# Patient Record
Sex: Female | Born: 1955 | Race: Black or African American | Hispanic: No | Smoking: Former smoker
Health system: Southern US, Community
[De-identification: ages and names within clinical notes are randomized; demographics above are authoritative.]

## PROBLEM LIST (undated history)

## (undated) DIAGNOSIS — M810 Age-related osteoporosis without current pathological fracture: Secondary | ICD-10-CM

## (undated) DIAGNOSIS — G35 Multiple sclerosis: Secondary | ICD-10-CM

## (undated) DIAGNOSIS — C801 Malignant (primary) neoplasm, unspecified: Secondary | ICD-10-CM

## (undated) DIAGNOSIS — R413 Other amnesia: Secondary | ICD-10-CM

## (undated) DIAGNOSIS — R269 Unspecified abnormalities of gait and mobility: Secondary | ICD-10-CM

## (undated) DIAGNOSIS — G709 Myoneural disorder, unspecified: Secondary | ICD-10-CM

## (undated) HISTORY — PX: MASTECTOMY: SHX3

## (undated) HISTORY — DX: Multiple sclerosis: G35

## (undated) HISTORY — PX: TONSILLECTOMY: SUR1361

## (undated) HISTORY — DX: Other amnesia: R41.3

## (undated) HISTORY — PX: DIAGNOSTIC LAPAROSCOPY: SUR761

## (undated) HISTORY — DX: Age-related osteoporosis without current pathological fracture: M81.0

## (undated) HISTORY — DX: Unspecified abnormalities of gait and mobility: R26.9

---

## 1999-04-29 ENCOUNTER — Emergency Department (HOSPITAL_COMMUNITY): Admission: EM | Admit: 1999-04-29 | Discharge: 1999-04-29 | Payer: Self-pay | Admitting: Emergency Medicine

## 1999-07-15 ENCOUNTER — Emergency Department (HOSPITAL_COMMUNITY): Admission: EM | Admit: 1999-07-15 | Discharge: 1999-07-15 | Payer: Self-pay | Admitting: Emergency Medicine

## 1999-07-15 ENCOUNTER — Encounter: Payer: Self-pay | Admitting: Emergency Medicine

## 2000-08-12 ENCOUNTER — Encounter: Payer: Self-pay | Admitting: Emergency Medicine

## 2000-08-12 ENCOUNTER — Emergency Department (HOSPITAL_COMMUNITY): Admission: EM | Admit: 2000-08-12 | Discharge: 2000-08-12 | Payer: Self-pay | Admitting: Emergency Medicine

## 2003-11-04 ENCOUNTER — Other Ambulatory Visit: Payer: Self-pay

## 2004-11-23 ENCOUNTER — Emergency Department: Payer: Self-pay | Admitting: Emergency Medicine

## 2007-05-29 ENCOUNTER — Emergency Department (HOSPITAL_COMMUNITY): Admission: EM | Admit: 2007-05-29 | Discharge: 2007-05-29 | Payer: Self-pay | Admitting: Emergency Medicine

## 2007-08-06 ENCOUNTER — Emergency Department (HOSPITAL_COMMUNITY): Admission: EM | Admit: 2007-08-06 | Discharge: 2007-08-06 | Payer: Self-pay | Admitting: Family Medicine

## 2007-11-11 ENCOUNTER — Emergency Department (HOSPITAL_COMMUNITY): Admission: EM | Admit: 2007-11-11 | Discharge: 2007-11-11 | Payer: Self-pay | Admitting: Emergency Medicine

## 2008-01-13 ENCOUNTER — Encounter: Admission: RE | Admit: 2008-01-13 | Discharge: 2008-01-13 | Payer: Self-pay | Admitting: Gastroenterology

## 2009-12-24 ENCOUNTER — Emergency Department (HOSPITAL_COMMUNITY): Admission: EM | Admit: 2009-12-24 | Discharge: 2009-12-25 | Payer: Self-pay | Admitting: Emergency Medicine

## 2010-02-07 ENCOUNTER — Emergency Department (HOSPITAL_COMMUNITY): Admission: EM | Admit: 2010-02-07 | Discharge: 2010-02-07 | Payer: Self-pay | Admitting: Emergency Medicine

## 2010-03-26 ENCOUNTER — Emergency Department (HOSPITAL_COMMUNITY): Admission: EM | Admit: 2010-03-26 | Discharge: 2010-03-26 | Payer: Self-pay | Admitting: Psychiatry

## 2010-08-07 ENCOUNTER — Encounter: Payer: Self-pay | Admitting: Internal Medicine

## 2010-12-06 ENCOUNTER — Emergency Department (HOSPITAL_COMMUNITY): Payer: Medicare Other

## 2010-12-06 ENCOUNTER — Emergency Department (HOSPITAL_COMMUNITY)
Admission: EM | Admit: 2010-12-06 | Discharge: 2010-12-06 | Disposition: A | Discharge status: Home / Self Care | Payer: Medicare Other | Attending: Emergency Medicine | Admitting: Emergency Medicine

## 2010-12-06 DIAGNOSIS — M25579 Pain in unspecified ankle and joints of unspecified foot: Secondary | ICD-10-CM | POA: Insufficient documentation

## 2010-12-06 DIAGNOSIS — M25559 Pain in unspecified hip: Secondary | ICD-10-CM | POA: Insufficient documentation

## 2010-12-06 DIAGNOSIS — M7989 Other specified soft tissue disorders: Secondary | ICD-10-CM | POA: Insufficient documentation

## 2010-12-06 DIAGNOSIS — M25569 Pain in unspecified knee: Secondary | ICD-10-CM | POA: Insufficient documentation

## 2010-12-06 DIAGNOSIS — M79609 Pain in unspecified limb: Secondary | ICD-10-CM | POA: Insufficient documentation

## 2010-12-06 DIAGNOSIS — Y92009 Unspecified place in unspecified non-institutional (private) residence as the place of occurrence of the external cause: Secondary | ICD-10-CM | POA: Insufficient documentation

## 2010-12-06 DIAGNOSIS — W108XXA Fall (on) (from) other stairs and steps, initial encounter: Secondary | ICD-10-CM | POA: Insufficient documentation

## 2010-12-06 DIAGNOSIS — G35 Multiple sclerosis: Secondary | ICD-10-CM | POA: Insufficient documentation

## 2010-12-06 DIAGNOSIS — S8263XA Displaced fracture of lateral malleolus of unspecified fibula, initial encounter for closed fracture: Secondary | ICD-10-CM | POA: Insufficient documentation

## 2011-04-06 LAB — POCT URINALYSIS DIP (DEVICE)
Glucose, UA: NEGATIVE
Ketones, ur: NEGATIVE
Specific Gravity, Urine: 1.025
Urobilinogen, UA: 2 — ABNORMAL HIGH

## 2011-04-11 LAB — WET PREP, GENITAL
Clue Cells Wet Prep HPF POC: NONE SEEN
Yeast Wet Prep HPF POC: NONE SEEN

## 2011-04-11 LAB — POCT I-STAT, CHEM 8
BUN: 12
Chloride: 104
Creatinine, Ser: 1
Glucose, Bld: 90
Hemoglobin: 12.9
Potassium: 3.6

## 2011-04-11 LAB — RPR: RPR Ser Ql: NONREACTIVE

## 2011-04-25 LAB — COMPREHENSIVE METABOLIC PANEL
ALT: 13
AST: 17
BUN: 13
Calcium: 9.5
GFR calc non Af Amer: >60
Glucose, Bld: 89

## 2011-04-25 LAB — DIFFERENTIAL
Eosinophils Absolute: 0 — ABNORMAL LOW
Eosinophils Relative: 0
Lymphs Abs: 1.9
Monocytes Relative: 11

## 2011-04-25 LAB — URINALYSIS, ROUTINE W REFLEX MICROSCOPIC
Bilirubin Urine: NEGATIVE
Ketones, ur: NEGATIVE
Nitrite: NEGATIVE
Protein, ur: NEGATIVE
Urobilinogen, UA: 1

## 2011-04-25 LAB — CBC
HCT: 37.9
Hemoglobin: 12.8
MCV: 91.1
Platelets: 236
RDW: 13.7

## 2011-04-25 LAB — LIPASE, BLOOD: Lipase: 30

## 2012-05-13 ENCOUNTER — Other Ambulatory Visit: Payer: Self-pay | Admitting: Neurology

## 2012-05-13 DIAGNOSIS — R413 Other amnesia: Secondary | ICD-10-CM

## 2012-05-13 DIAGNOSIS — R269 Unspecified abnormalities of gait and mobility: Secondary | ICD-10-CM

## 2012-05-13 DIAGNOSIS — W19XXXA Unspecified fall, initial encounter: Secondary | ICD-10-CM

## 2012-05-20 ENCOUNTER — Ambulatory Visit
Admission: RE | Admit: 2012-05-20 | Discharge: 2012-05-20 | Disposition: A | Discharge status: Home / Self Care | Payer: Medicare Other | Source: Ambulatory Visit | Attending: Neurology | Admitting: Neurology

## 2012-05-20 DIAGNOSIS — R269 Unspecified abnormalities of gait and mobility: Secondary | ICD-10-CM

## 2012-05-20 DIAGNOSIS — W19XXXA Unspecified fall, initial encounter: Secondary | ICD-10-CM

## 2012-05-20 DIAGNOSIS — R413 Other amnesia: Secondary | ICD-10-CM

## 2012-05-31 ENCOUNTER — Other Ambulatory Visit: Payer: Self-pay | Admitting: Neurology

## 2012-05-31 DIAGNOSIS — R269 Unspecified abnormalities of gait and mobility: Secondary | ICD-10-CM

## 2012-05-31 DIAGNOSIS — R413 Other amnesia: Secondary | ICD-10-CM

## 2012-05-31 DIAGNOSIS — W19XXXA Unspecified fall, initial encounter: Secondary | ICD-10-CM

## 2012-06-06 ENCOUNTER — Other Ambulatory Visit: Payer: Medicare Other

## 2012-06-06 ENCOUNTER — Ambulatory Visit
Admission: RE | Admit: 2012-06-06 | Discharge: 2012-06-06 | Disposition: A | Discharge status: Home / Self Care | Payer: Medicare Other | Source: Ambulatory Visit | Attending: Neurology | Admitting: Neurology

## 2012-06-06 VITALS — BP 113/67 | HR 55

## 2012-06-06 DIAGNOSIS — W19XXXA Unspecified fall, initial encounter: Secondary | ICD-10-CM

## 2012-06-06 DIAGNOSIS — R269 Unspecified abnormalities of gait and mobility: Secondary | ICD-10-CM

## 2012-06-06 DIAGNOSIS — R413 Other amnesia: Secondary | ICD-10-CM

## 2012-06-06 LAB — CSF CELL COUNT WITH DIFFERENTIAL: Tube #: 4

## 2012-06-06 LAB — GRAM STAIN: Gram Stain: NONE SEEN

## 2012-06-06 NOTE — Progress Notes (Signed)
Blood drawn for labs to go with spinal fluid, #21 g used, right AC, site unremarkable, discharge instructions explained to pt. JKL RN

## 2012-06-10 LAB — VDRL, CSF

## 2012-06-11 LAB — MULTIPLE SCLEROSIS PANEL 2
CNS-IgG Synthesis Rate: 27.9 mg/24hr — ABNORMAL HIGH (ref <3.3)
IgA CSF: 0.49 mg/dL (ref 0.15–0.60)
IgA Total: 225 mg/dL (ref 81–463)
IgG Total: 1712 mg/dL — ABNORMAL HIGH (ref 694–1618)
IgG-Index: 1.07 — ABNORMAL HIGH (ref <0.70)
IgM Total: 124 mg/dL (ref 48–271)
IgM-CSF: 0.19 mg/dL — ABNORMAL HIGH (ref <0.10)

## 2012-12-03 ENCOUNTER — Encounter: Payer: Self-pay | Admitting: Nurse Practitioner

## 2012-12-03 DIAGNOSIS — W19XXXA Unspecified fall, initial encounter: Secondary | ICD-10-CM | POA: Insufficient documentation

## 2012-12-03 DIAGNOSIS — R413 Other amnesia: Secondary | ICD-10-CM | POA: Insufficient documentation

## 2012-12-03 DIAGNOSIS — R269 Unspecified abnormalities of gait and mobility: Secondary | ICD-10-CM

## 2012-12-10 ENCOUNTER — Encounter: Payer: Self-pay | Admitting: Nurse Practitioner

## 2012-12-10 ENCOUNTER — Ambulatory Visit (INDEPENDENT_AMBULATORY_CARE_PROVIDER_SITE_OTHER): Payer: Medicaid Other | Admitting: Nurse Practitioner

## 2012-12-10 VITALS — BP 114/65 | HR 63 | Ht 70.5 in | Wt 134.0 lb

## 2012-12-10 DIAGNOSIS — G35 Multiple sclerosis: Secondary | ICD-10-CM

## 2012-12-10 DIAGNOSIS — R269 Unspecified abnormalities of gait and mobility: Secondary | ICD-10-CM

## 2012-12-10 DIAGNOSIS — R413 Other amnesia: Secondary | ICD-10-CM

## 2012-12-10 NOTE — Patient Instructions (Addendum)
Continue HEP per Physical therapy F/U in 1 year or sooner if needed

## 2012-12-10 NOTE — Progress Notes (Signed)
HPI: Patient returns for followup after last visit with Dr. Terrace Arabia 011/26/2013. She has a history of gait disorder which has been going on for several years,a POA    That oversees her care. Patient only provides limited history.  Most recent MRI of the brain shows multiple brainstem, cerebellar, subcortical and corpus callosum white matter hyperintensities suspicious for demyelinating disease. Presence of T1 black holes and atrophy indicate chronic disease. Patient has never been on any immune modulating therapy. Her mom and her sister both died of MS at early ages. She has received some physical therapy since last seen which was beneficial. She goes to the [facility] five times a week for 8 hours. She has had an abnormal visual evoked response. She had greater than 5 oligoclonal bands in her CSF with LP done 06/11/2012. She denies any falls since last seen. She is not using an assistive device. Her memory is stable  ROS:  Negative  Physical Exam General: well developed, thin female seated, in no evident distress Head: head normocephalic and atraumatic. Oropharynx benign Neck: supple with no carotid  bruits Cardiovascular: regular rate and rhythm, no murmurs  Neurologic Exam Mental Status: Awake and fully alert. MMSE 23/30 missing items in orientation attention and calculation and one of 3 recall .Follows all commands .  Cranial Nerves:  Pupils equal, briskly reactive to light. Extraocular movements full without nystagmus. Visual fields full to confrontation. Hearing intact and symmetric to finger snap. Facial sensation intact. Face, tongue, palate move normally and symmetrically. Neck flexion and extension normal.  Motor: Mild bilateral lower extremity spasticity, no significant weakness, mild upper and lower rigidity in the extremities Sensory.: intact to touch and pinprick and vibratory.  Coordination: Rapid alternating movements normal in all extremities. Finger-to-nose and heel-to-shin  performed accurately bilaterally. Gait and Station: Wide-based gait cautious and unsteady. Unable to tandem. Romberg positive. No assistive device   Reflexes: 2+ and symmetric except patellar 3+. Toes downgoing.     ASSESSMENT: Chronic gait difficulty, wide-based unsteady gait, hyperreflexia, bilateral lower extremity spasticity, memory loss, MRI of the brain with multiple brainstem cerebellar subcortical and corpus callosum white matter hyperintensities with presence of T1 black holes in atrophy of the corpus callosum and cortex which indicates chronic disease. LP in November 2013 positive for oligoclonal bands. 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

## 2013-03-21 ENCOUNTER — Telehealth: Payer: Self-pay | Admitting: Neurology

## 2013-03-21 NOTE — Telephone Encounter (Signed)
I have received a call her primary care physician Dr. Milus Glazier. Patient lives with her daughter, has progressive worsening memory, incontinence, last seen by Eber Jones in May 2014,   . She has a history of gait disorder which has been going on for several years,a POA That oversees her care. Patient only provides limited history. Most recent MRI of the brain shows multiple brainstem, cerebellar, subcortical and corpus callosum white matter hyperintensities suspicious for demyelinating disease. Presence of T1 black holes and atrophy indicate chronic disease. Patient has never been on any immune modulating therapy. Her mom and her sister both died of MS at early ages. She has received some physical therapy since last seen which was beneficial. She goes to the [facility] five times a week for 8 hours. She has had an abnormal visual evoked response. She had greater than 5 oligoclonal bands in her CSF with LP done 06/11/2012. She denies any falls since last seen. She is not using an assistive device.    Fax [phone]  She has likely secondary progressive MS, need more home health.  Annabelle Harman, please fax previous note over to above fax No.

## 2013-03-24 NOTE — Telephone Encounter (Signed)
Done. Faxed.

## 2013-08-05 ENCOUNTER — Telehealth: Payer: Self-pay | Admitting: *Deleted

## 2013-08-05 NOTE — Telephone Encounter (Signed)
Patient has hx of MS and gait disorder and memory problems. See if there is a sooner appt with me in the next few weeks

## 2013-08-05 NOTE — Telephone Encounter (Signed)
Melissa Oconnell called in regards to the patient and wanted a sooner appointment due to the patients memory and her leg weakness. [name] was not able to find a sooner appointment and gave message to me. Not sure if the patient needs to come in sooner and if so could she see Dr. Krista Blue if she has anything. Please advise.

## 2013-08-06 NOTE — Telephone Encounter (Signed)
Tried calling Rosanna at [phone] but no answer. Was calling to set up a sooner appointment for patient. Could not leave a message due to voicemail not being set up.

## 2013-08-18 ENCOUNTER — Ambulatory Visit (INDEPENDENT_AMBULATORY_CARE_PROVIDER_SITE_OTHER): Payer: Medicare Other | Admitting: Nurse Practitioner

## 2013-08-18 ENCOUNTER — Encounter: Payer: Self-pay | Admitting: Nurse Practitioner

## 2013-08-18 ENCOUNTER — Encounter (INDEPENDENT_AMBULATORY_CARE_PROVIDER_SITE_OTHER): Payer: Self-pay

## 2013-08-18 VITALS — BP 139/83 | HR 78 | Ht 70.5 in

## 2013-08-18 DIAGNOSIS — R413 Other amnesia: Secondary | ICD-10-CM

## 2013-08-18 DIAGNOSIS — R269 Unspecified abnormalities of gait and mobility: Secondary | ICD-10-CM

## 2013-08-18 DIAGNOSIS — G35 Multiple sclerosis: Secondary | ICD-10-CM

## 2013-08-18 NOTE — Progress Notes (Signed)
[facility]  PATIENT: POLLYANN ROA DOB: 1955/08/27   REASON FOR VISIT: Followup for secondary progressive multiple sclerosis    HISTORY OF PRESENT ILLNESS:Ms Alejandro, 58 year old white female returns for followup. She has a history of gait disorder and secondary progressive multiple sclerosis. She has never been on immune modulating therapy for her multiple sclerosis. She continues to get socialization at the [facility]. She has a power of attorney that oversees her care. She has 2 sons but they are not involved and she has not talked to them in years. She has a walker that she uses at home, she did not bring an assistive device today. Her appetite is good and she is sleeping well. She returns for reevaluation  She has a history of gait disorder which has been going on for several years,a POA That oversees her care. Patient only provides limited history. Most recent MRI of the brain shows multiple brainstem, cerebellar, subcortical and corpus callosum white matter hyperintensities suspicious for demyelinating disease. Presence of T1 black holes and atrophy indicate chronic disease. Patient has never been on any immune modulating therapy. Her mom and her sister both died of MS at early ages. She has received some physical therapy since last seen which was beneficial. She goes to the [facility] five times a week for 8 hours. She has had an abnormal visual evoked response. She had greater than 5 oligoclonal bands in her CSF with LP done 06/11/2012. She denies any falls since last seen. She is not using an assistive device. Her memory is stable  REVIEW OF SYSTEMS: Full 14 system review of systems performed and notable only for those listed, all others are neg:  Constitutional: N/A  Cardiovascular: N/A  Ear/Nose/Throat: N/A  Skin: N/A  Eyes: N/A  Respiratory: N/A  Gastroitestinal: N/A  Hematology/Lymphatic: N/A  Endocrine: N/A Musculoskeletal:N/A    Allergy/Immunology: N/A  Neurological: N/A Psychiatric: N/A   ALLERGIES: No Known Allergies  HOME MEDICATIONS: No outpatient prescriptions prior to visit.   No facility-administered medications prior to visit.    PAST MEDICAL HISTORY: Past Medical History  Diagnosis Date  . Multiple sclerosis   . Memory loss   . Abnormality of gait     PAST SURGICAL HISTORY: History reviewed. No pertinent past surgical history.  FAMILY HISTORY: History reviewed. No pertinent family history.  SOCIAL HISTORY: History   Social History  . Marital Status: Legally Separated    Spouse Name: N/A    Number of Children: 2  . Years of Education: N/A   Occupational History  . Not on file.   Social History Main Topics  . Smoking status: Former Research scientist (life sciences)  . Smokeless tobacco: Never Used  . Alcohol Use: No  . Drug Use: No  . Sexual Activity: Not on file   Other Topics Concern  . Not on file   Social History Narrative   Patient lives at home with [name]. Patient has 2 children. Patient does not work.      PHYSICAL EXAM  Filed Vitals:   08/18/13 0904  BP: 139/83  Pulse: 78  Height: 5' 10.5" (1.791 m)   Body mass index is 0.00 kg/(m^2).  Generalized: Well developed, in no acute distress  Head: normocephalic and atraumatic,. Oropharynx benign  Neck: Supple, no carotid bruits  Cardiac: Regular rate rhythm, no murmur  Musculoskeletal: No deformity   Neurological examination   Mentation: Alert , MMSE 20/30 missing items in orientation, calculation, and 2/3 recall. AFT  9.  Follows all commands speech and language fluent  Cranial nerve II-XII: Pupils were equal round reactive to light extraocular movements were full, visual field were full on confrontational test. Facial sensation and strength were normal. hearing was intact to finger rubbing bilaterally. Uvula tongue midline. head turning and shoulder shrug were normal and symmetric.Tongue protrusion into cheek strength  was normal. Motor: Mild bilateral lower extremity spasticity no significant weakness.  Sensory: normal and symmetric to light touch, pinprick, and  vibration  Coordination: finger-nose-finger, heel-to-shin bilaterally, no dysmetria Reflexes: Brachioradialis 2/2, biceps 2/2, triceps 2/2, patellar 3/3, Achilles 2/2, plantar responses were flexor bilaterally. Gait and Station: Rising up from seated position without assistance, wide based, stooped posture, cautious unsteady gait. No assistive device unable to tandem. Romberg positive   DIAGNOSTIC DATA (LABS, IMAGING, TESTING) -  ASSESSMENT AND PLAN  58 y.o. year old female  has a past medical history of Multiple sclerosis; Memory loss; and Abnormality of gait, here to followup. MRI of the brain with multiple brainstem cerebellar subcortical and corpus callosum white matter hyperintensities with presence of T1 black holes and atrophy of the corpus callosum and cortex which indicates chronic disease.Oligoclonal bands present in CSF. she has secondary progressive MS. She has never been on any immune  modulating therapy. Memory loss is stable from her previous Mini-Mental Status exam   Continue home exercise program every day Continue socialization and activities at the [facility] and adequate sleep are important Memory score is stable F/U yearly Dennie Bible, [facility], [facility], APRN  [facility] [address], [place], [place] [address] [phone]

## 2013-08-18 NOTE — Patient Instructions (Signed)
Continue home exercise program every day Continue socialization and activities at the [facility] and adequate sleep are important Memory score is stable F/U yearly

## 2013-10-15 ENCOUNTER — Other Ambulatory Visit: Payer: Self-pay | Admitting: Nurse Practitioner

## 2013-10-15 DIAGNOSIS — N6310 Unspecified lump in the right breast, unspecified quadrant: Secondary | ICD-10-CM

## 2013-10-20 ENCOUNTER — Ambulatory Visit
Admission: RE | Admit: 2013-10-20 | Discharge: 2013-10-20 | Disposition: A | Discharge status: Home / Self Care | Payer: Medicare Other | Source: Ambulatory Visit | Attending: Nurse Practitioner | Admitting: Nurse Practitioner

## 2013-10-20 ENCOUNTER — Other Ambulatory Visit: Payer: Self-pay | Admitting: Nurse Practitioner

## 2013-10-20 DIAGNOSIS — N6310 Unspecified lump in the right breast, unspecified quadrant: Secondary | ICD-10-CM

## 2013-10-28 ENCOUNTER — Other Ambulatory Visit: Payer: Self-pay | Admitting: Nurse Practitioner

## 2013-10-28 ENCOUNTER — Ambulatory Visit
Admission: RE | Admit: 2013-10-28 | Discharge: 2013-10-28 | Disposition: A | Discharge status: Home / Self Care | Payer: Medicare Other | Source: Ambulatory Visit | Attending: Nurse Practitioner | Admitting: Nurse Practitioner

## 2013-10-28 DIAGNOSIS — N6310 Unspecified lump in the right breast, unspecified quadrant: Secondary | ICD-10-CM

## 2013-10-30 ENCOUNTER — Other Ambulatory Visit: Payer: Self-pay | Admitting: Nurse Practitioner

## 2013-10-30 DIAGNOSIS — C50919 Malignant neoplasm of unspecified site of unspecified female breast: Secondary | ICD-10-CM

## 2013-11-06 ENCOUNTER — Ambulatory Visit
Admission: RE | Admit: 2013-11-06 | Discharge: 2013-11-06 | Disposition: A | Discharge status: Home / Self Care | Payer: Medicare Other | Source: Ambulatory Visit | Attending: Nurse Practitioner | Admitting: Nurse Practitioner

## 2013-11-06 DIAGNOSIS — C50919 Malignant neoplasm of unspecified site of unspecified female breast: Secondary | ICD-10-CM

## 2013-11-06 MED ORDER — GADOBENATE DIMEGLUMINE 529 MG/ML IV SOLN
11.0000 mL | Freq: Once | INTRAVENOUS | Status: AC | PRN
  Administered 2013-11-06: 11 mL via INTRAVENOUS

## 2013-11-11 ENCOUNTER — Ambulatory Visit (INDEPENDENT_AMBULATORY_CARE_PROVIDER_SITE_OTHER): Payer: Medicare Other | Admitting: General Surgery

## 2013-11-11 ENCOUNTER — Encounter (INDEPENDENT_AMBULATORY_CARE_PROVIDER_SITE_OTHER): Payer: Self-pay | Admitting: General Surgery

## 2013-11-11 VITALS — BP 110/70 | HR 78 | Temp 97.8°F | Resp 14 | Ht 72.0 in | Wt 129.2 lb

## 2013-11-11 DIAGNOSIS — C50419 Malignant neoplasm of upper-outer quadrant of unspecified female breast: Secondary | ICD-10-CM

## 2013-11-11 NOTE — Patient Instructions (Signed)
You have an invasive cancer in the upper outer quadrant of the right breast.  This is a solitary finding on the MRI study. The left breast looks normal.  We have talked about lumpectomy and sentinel node biopsy and mastectomy.  For several reasons, we have decided to go ahead with right total mastectomy and sentinel lymph node biopsy.      Mastectomy Care After [place] for your wound after the bandages are off as told by your doctor.  Put soft padding such as gauze, soft cloth, or a nursing pad over your wound if you wear a bra.  Ask your doctor about groups that can help you with any emotions you may have after the surgery.  Exercise your arm and shoulder as told by your doctor.  Place your hands on a wall. Use your fingers to "climb the wall." Reach as high as you can until you feel a stretch. When you are not exercising, keep your arm raised (elevated).  When sitting or lying down, put your arm up on pillows or rolled blankets.  Do not use your arm to lift or push anything heavier than 10 pounds (about one gallon of milk ) for the first 6 weeks.  Always take good care of the arm on the side that the breast was removed.  Never let anyone take your blood pressure, draw blood, or give you a shot in that arm.  Do not get even a small cut on that arm or hand. Use a thimble when you sew. Wear heavy gloves when you garden.  Use insect repellent on that arm if outside.  Do not use a razor to shave that underarm. You should use only an electric shaver.  Do not burn that arm. Use a glove when you reach into the oven. Cover your arm with a towel or wear a long-sleeved shirt when you are out in the sun.  Wear your watch and other jewelry on the other arm.  Wear a loose fitting rubber glove when you wash the dishes. Do not leave your hand in water for a long time, especially when you use detergents.  Do not cut your cuticles or hang nails. Push cuticles back with a towel  after you take a bath.  Carry your purse or any heavy objects in the other arm. GET HELP RIGHT AWAY IF:   Your arm becomes very puffy (swollen).  You have redness or pain at the wound site.  There is a bad smell coming from the wound.  Thre is yellowish white fluid (pus) coming from your wound.  You have a fever. MAKE SURE YOU:   Understand these instructions.  Will watch your condition.  Will get help right away if you are not doing well or get worse. Document Released: 04/11/2008 Document Revised: 09/25/2011 Document Reviewed: 04/11/2008 [facility] Patient Information 2014 [place]. Mastectomy, With or Without Reconstruction Mastectomy (removal of the breast) is a procedure most commonly used to treat cancer (tumor) of the breast. Different procedures are available for treatment. This depends on the stage of the tumor (abnormal growths). Discuss this with your caregiver, surgeon (a specialist for performing operations such as this), or oncologist (someone specialized in the treatment of cancer). With proper information, you can decide which treatment is best for you. Although the sound of the word cancer is frightening to all of [place], the new treatments and medications can be a source of reassurance and comfort. If there are things  you are worried about, discuss them with your caregiver. He or she can help comfort you and your family. Some of the different procedures for treating breast cancer are:  Radical (extensive) mastectomy. This is an operation used to remove the entire breast, the muscles under the breast, and all of the glands (lymph nodes) under the arm. With all of the new treatments available for cancer of the breast, this procedure has become less common.  Modified radical mastectomy. This is a similar operation to the radical mastectomy described above. In the modified radical mastectomy, the muscles of the chest wall are not removed unless one of the lessor muscles is  removed. One of the lessor muscles may be removed to allow better removal of the lymph nodes. The axillary lymph nodes are also removed. Rarely, during an axillary node dissection nerves to this area are damaged. Radiation therapy is then often used to the area following this surgery.  A total mastectomy also known as a complete or simple mastectomy. It involves removal of only the breast. The lymph nodes and the muscles are left in place.  In a lumpectomy, the lump is removed from the breast. This is the simplest form of surgical treatment. A sentinel lymph node biopsy may also be done. Additional treatment may be required. RISKS AND COMPLICATIONS The main problems that follow removal of the breast include:  Infection (germs start growing in the wound). This can usually be treated with antibiotics (medications that kill germs).  Lymphedema. This means the arm on the side of the breast that was operated on swells because the lymph (tissue fluid) cannot follow the main channels back into the body. This only occurs when the lymph nodes have had to be removed under the arm.  There may be some areas of numbness to the upper arm and around the incision (cut by the surgeon) in the breast. This happens because of the cutting of or damage to some of the nerves in the area. This is most often unavoidable.  There may be difficulty moving the arm in a full range of motion (moving in all directions) following surgery. This usually improves with time following use and exercise.  Recurrence of breast cancer may happen with the very best of surgery and follow up treatment. Sometimes small cancer cells that cannot be seen with the naked eye have already spread at the time of surgery. When this happens other treatment is available. This treatment may be radiation, medications or a combination of both. RECONSTRUCTION Reconstruction of the breast may be done immediately if there is not going to be post-operative  radiation. This surgery is done for cosmetic (improve appearance) purposes to improve the physical appearance after the operation. This may be done in two ways:  It can be done using a saline filled prosthetic (an artificial breast which is filled with salt water). Silicone breast implants are now re-approved by the FDA and are being commonly used.  Reconstruction can be done using the body's own muscle/fat/skin. Your caregiver will discuss your options with you. Depending upon your needs or choice, together you will be able to determine which procedure is best for you. Document Released: 03/28/2001 Document Revised: 03/27/2012 Document Reviewed: 11/19/2007 [facility] Patient Information 2014 [place].

## 2013-11-11 NOTE — Progress Notes (Addendum)
Patient ID: Melissa Oconnell, female   DOB: 06-12-1956, 58 y.o.   MRN: [id number]  Chief Complaint  Patient presents with  . New Evaluation    Right breast cancer-new    HPI Melissa Oconnell is a 58 y.o. female.  She is referred by Dr. Joaquim Lai at the breast center [facility] for evaluation and surgical management of a newly diagnosed invasive cancer right breast, upper outer quadrant.   This patient has fairly severe multiple sclerosis. She is ambulatory but prefers to use a walker. She is thin and her performance status is not that good. She lives in a house where there is a caregiver around-the-clock. Her caregiver is with her today.  The patient has no prior history of breast problems. She recently felt a lump in the upper outer quadrant of her right breast. Imaging studies show a 2 cm mass in the right breast at the 10:00 position, 5 cm from the nipple. 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 ER-positive 100%, PR neg-0%, Ki 67 60%, HER-2 negative. MRI shows a solitary mass in the upper outer quadrant right breast, 2.2 cm diameter, otherwise negative  breast MRI.  Comorbidities include multiple sclerosis, accidental fall, memory loss, gait disorder. She takes no medications.  Family history - her mother was diagnosed with breast cancer in postmenopausal state. Otherwise family history negative for breast or ovarian cancer. HPI  Past Medical History  Diagnosis Date  . Multiple sclerosis   . Memory loss   . Abnormality of gait     History reviewed. No pertinent past surgical history.  Family History  Problem Relation Age of Onset  . Cancer Mother     breast    Social History History  Substance Use Topics  . Smoking status: Former Research scientist (life sciences)  . Smokeless tobacco: Never Used  . Alcohol Use: No    No Known Allergies  No current outpatient prescriptions on file.   No current facility-administered medications for this visit.    Review of Systems Review of  Systems  Constitutional: Positive for unexpected weight change. Negative for fever and chills.  HENT: Negative for congestion, hearing loss, sore throat, trouble swallowing and voice change.   Eyes: Negative for visual disturbance.  Respiratory: Negative for cough and wheezing.   Cardiovascular: Negative for chest pain, palpitations and leg swelling.  Gastrointestinal: Negative for nausea, vomiting, abdominal pain, diarrhea, constipation, blood in stool, abdominal distention and anal bleeding.  Genitourinary: Negative for hematuria, vaginal bleeding and difficulty urinating.  Musculoskeletal: Positive for gait problem. Negative for arthralgias.  Skin: Negative for rash and wound.  Neurological: Positive for weakness. Negative for seizures, syncope and headaches.  Hematological: Negative for adenopathy. Does not bruise/bleed easily.  Psychiatric/Behavioral: Negative for confusion.    Blood pressure 110/70, pulse 78, temperature 97.8 F (36.6 C), temperature source Temporal, resp. rate 14, height 6' (1.829 m), weight 129 lb 3.2 oz (58.605 kg).  Physical Exam Physical Exam  Constitutional: She is oriented to person, place, and time. She appears well-developed and well-nourished. No distress.  Alert and cooperative. Appear somewhat feeble and thin. Performance status is not that good.has a walker. Her caregiver is with her throughout the encounter. Her caregiver participates in this in the discussion.  HENT:  Head: Normocephalic and atraumatic.  Nose: Nose normal.  Mouth/Throat: No oropharyngeal exudate.  Eyes: Conjunctivae and EOM are normal. Pupils are equal, round, and reactive to light. Left eye exhibits no discharge. No scleral icterus.  Neck: Neck supple. No JVD  present. No tracheal deviation present. No thyromegaly present.  Cardiovascular: Normal rate, regular rhythm, normal heart sounds and intact distal pulses.   No murmur heard. Pulmonary/Chest: Effort normal and breath sounds  normal. No respiratory distress. She has no wheezes. She has no rales. She exhibits no tenderness.    Breasts are small and atrophic. There is a 2.5 cm palpable mass high in the upper outer quadrant of the right breast. I don't feel any other masses in either breast. I don't feel any axillary adenopathy on either side. Skin is paper thin but without neoplastic lesions.  Abdominal: Soft. Bowel sounds are normal. She exhibits no distension and no mass. There is no tenderness. There is no rebound and no guarding.  Musculoskeletal: She exhibits no edema and no tenderness.  Lymphadenopathy:    She has no cervical adenopathy.  Neurological: She is alert and oriented to person, place, and time. She exhibits normal muscle tone. Coordination normal.  Skin: Skin is warm. No rash noted. She is not diaphoretic. No erythema. No pallor.  Psychiatric: She has a normal mood and affect. Her behavior is normal. Judgment and thought content normal.    Data Reviewed Imaging studies. 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 Clinical stage TII, N0  Decreased performance status and disability secondary to multiple sclerosis  Gait disturbance  Family history breast cancer in mother     Plan    I had a very long talk with the patient and her caregiver. We talked about a lumpectomy, mastectomy, SLN biopsy, radiation therapy, antiestrogen therapy. We talked about involving medical oncology and radiation oncology in her care. She was offered referral to medical oncology and  Radiation oncology reop. Because of the significant deformity that would occur with a lumpectomy, and because of the hardships that might occur from daily radiation therapy, the patient stated she wanted to simply have a mastectomy and sentinel node biopsy. I agree, and think that  would probably be in her best interest.  She'll be referred to  medical oncology postop. She declined referral preop  We briefly mentioned plastic surgical reconstruction, but she clearly stated that was not something she was interested in.  She'll be scheduled for a right total mastectomy with right axillary sentinel the biopsy in the near future.  I discussed the indications, details, techniques, and numerous risk of the surgery with her and her caregiver. There was a risk of bleeding, infection, skin necrosis, recurrence of the cancer, shoulder contracture, medical complications, and other proceed problems. She understands these issues well. At this time all her questions are answered. She agrees with this plan.        Edsel Petrin. Dalbert Batman, M.D., [facility] General and Minimally invasive Surgery Breast and Colorectal Surgery Office:   [phone] Pager:   [phone]  11/11/2013, 4:43 PM

## 2013-11-24 ENCOUNTER — Encounter (HOSPITAL_COMMUNITY): Payer: Self-pay | Admitting: Pharmacy Technician

## 2013-11-26 ENCOUNTER — Encounter (HOSPITAL_COMMUNITY): Payer: Self-pay

## 2013-11-26 ENCOUNTER — Encounter (HOSPITAL_COMMUNITY)
Admission: RE | Admit: 2013-11-26 | Discharge: 2013-11-26 | Disposition: A | Discharge status: Home / Self Care | Payer: Medicare Other | Source: Ambulatory Visit | Attending: General Surgery | Admitting: General Surgery

## 2013-11-26 DIAGNOSIS — Z01812 Encounter for preprocedural laboratory examination: Secondary | ICD-10-CM | POA: Insufficient documentation

## 2013-11-26 DIAGNOSIS — J438 Other emphysema: Secondary | ICD-10-CM | POA: Insufficient documentation

## 2013-11-26 DIAGNOSIS — C50919 Malignant neoplasm of unspecified site of unspecified female breast: Secondary | ICD-10-CM | POA: Insufficient documentation

## 2013-11-26 DIAGNOSIS — Z01810 Encounter for preprocedural cardiovascular examination: Secondary | ICD-10-CM | POA: Insufficient documentation

## 2013-11-26 DIAGNOSIS — Z01818 Encounter for other preprocedural examination: Secondary | ICD-10-CM | POA: Insufficient documentation

## 2013-11-26 HISTORY — DX: Myoneural disorder, unspecified: G70.9

## 2013-11-26 LAB — CBC WITH DIFFERENTIAL/PLATELET
Basophils Absolute: 0 10*3/uL (ref 0.0–0.1)
Basophils Relative: 1 % (ref 0–1)
Eosinophils Absolute: 0.1 10*3/uL (ref 0.0–0.7)
Eosinophils Relative: 4 % (ref 0–5)
HCT: 37.7 % (ref 36.0–46.0)
HEMOGLOBIN: 12.5 g/dL (ref 12.0–15.0)
LYMPHS ABS: 2.3 10*3/uL (ref 0.7–4.0)
Lymphocytes Relative: 60 % — ABNORMAL HIGH (ref 12–46)
MCH: 31.1 pg (ref 26.0–34.0)
MCHC: 33.2 g/dL (ref 30.0–36.0)
MCV: 93.8 fL (ref 78.0–100.0)
MONOS PCT: 7 % (ref 3–12)
Monocytes Absolute: 0.3 10*3/uL (ref 0.1–1.0)
NEUTROS ABS: 1 10*3/uL — AB (ref 1.7–7.7)
Neutrophils Relative %: 28 % — ABNORMAL LOW (ref 43–77)
Platelets: 200 10*3/uL (ref 150–400)
RBC: 4.02 MIL/uL (ref 3.87–5.11)
RDW: 12.8 % (ref 11.5–15.5)
WBC: 3.7 10*3/uL — ABNORMAL LOW (ref 4.0–10.5)

## 2013-11-26 LAB — COMPREHENSIVE METABOLIC PANEL
ALK PHOS: 78 U/L (ref 39–117)
ALT: 10 U/L (ref 0–35)
AST: 16 U/L (ref 0–37)
Albumin: 4.1 g/dL (ref 3.5–5.2)
BILIRUBIN TOTAL: 0.3 mg/dL (ref 0.3–1.2)
BUN: 15 mg/dL (ref 6–23)
CO2: 27 meq/L (ref 19–32)
Calcium: 9.7 mg/dL (ref 8.4–10.5)
Chloride: 103 mEq/L (ref 96–112)
Creatinine, Ser: 0.83 mg/dL (ref 0.50–1.10)
GFR, EST AFRICAN AMERICAN: 89 mL/min — AB (ref >90)
GFR, EST NON AFRICAN AMERICAN: 77 mL/min — AB (ref >90)
GLUCOSE: 86 mg/dL (ref 70–99)
POTASSIUM: 4.2 meq/L (ref 3.7–5.3)
Sodium: 139 mEq/L (ref 137–147)
TOTAL PROTEIN: 7.7 g/dL (ref 6.0–8.3)

## 2013-11-26 LAB — URINALYSIS, ROUTINE W REFLEX MICROSCOPIC
BILIRUBIN URINE: NEGATIVE
Glucose, UA: NEGATIVE mg/dL
Hgb urine dipstick: NEGATIVE
Ketones, ur: NEGATIVE mg/dL
Leukocytes, UA: NEGATIVE
Nitrite: NEGATIVE
PH: 7 (ref 5.0–8.0)
PROTEIN: NEGATIVE mg/dL
Specific Gravity, Urine: 1.015 (ref 1.005–1.030)
Urobilinogen, UA: 0.2 mg/dL (ref 0.0–1.0)

## 2013-11-26 LAB — CANCER ANTIGEN 27.29: CA 27.29: 16 U/mL (ref 0–39)

## 2013-11-26 NOTE — Pre-Procedure Instructions (Addendum)
YARA TOMKINSON  11/26/2013  Your procedure is scheduled on:  12/02/13  Report to [facility] short stay admitting at 700 AM.  Call this number if you have problems the morning of surgery: [phone]   Remember:   Do not eat food or drink liquids after midnight.   Take these medicines the morning of surgery with A SIP OF WATER: none     STOP all herbel meds, nsaids (aleve,naproxen,advil,ibuprofen) 5 days prior to surgery  Vitamins, aspirin   Do not wear jewelry, make-up or nail polish.  Do not wear lotions, powders, or perfumes. You may wear deodorant.  Do not shave 48 hours prior to surgery. Men may shave face and neck.  Do not bring valuables to the hospital.  [facility] is not responsible                  for any belongings or valuables.               Contacts, dentures or bridgework may not be worn into surgery.  Leave suitcase in the car. After surgery it may be brought to your room.  For patients admitted to the hospital, discharge time is determined by your                treatment team.               Patients discharged the day of surgery will not be allowed to drive  home.  Name and phone number of your driver:   Special Instructions:  Special Instructions: [place] - Preparing for Surgery  Before surgery, you can play an important role.  Because skin is not sterile, your skin needs to be as free of germs as possible.  You can reduce the number of germs on you skin by washing with CHG (chlorahexidine gluconate) soap before surgery.  CHG is an antiseptic cleaner which kills germs and bonds with the skin to continue killing germs even after washing.  Please DO NOT use if you have an allergy to CHG or antibacterial soaps.  If your skin becomes reddened/irritated stop using the CHG and inform your nurse when you arrive at Short Stay.  Do not shave (including legs and underarms) for at least 48 hours prior to the first CHG shower.  You may shave your face.  Please follow these  instructions carefully:   1.  Shower with CHG Soap the night before surgery and the morning of Surgery.  2.  If you choose to wash your hair, wash your hair first as usual with your normal shampoo.  3.  After you shampoo, rinse your hair and body thoroughly to remove the Shampoo.  4.  Use CHG as you would any other liquid soap.  You can apply chg directly  to the skin and wash gently with scrungie or a clean washcloth.  5.  Apply the CHG Soap to your body ONLY FROM THE NECK DOWN.  Do not use on open wounds or open sores.  Avoid contact with your eyes ears, mouth and genitals (private parts).  Wash genitals (private parts)       with your normal soap.  6.  Wash thoroughly, paying special attention to the area where your surgery will be performed.  7.  Thoroughly rinse your body with warm water from the neck down.  8.  DO NOT shower/wash with your normal soap after using and rinsing off the CHG Soap.  9.  Pat yourself dry  with a clean towel.            10.  Wear clean pajamas.            11.  Place clean sheets on your bed the night of your first shower and do not sleep with pets.  Day of Surgery  Do not apply any lotions/deodorants the morning of surgery.  Please wear clean clothes to the hospital/surgery center.   Please read over the following fact sheets that you were given: Pain Booklet, Coughing and Deep Breathing and Surgical Site Infection Prevention

## 2013-11-30 NOTE — H&P (Signed)
Melissa Oconnell   MRN:  [id number]   Description: 58 year old female  Provider: Adin Hector, MD  Department: [facility]                Diagnoses      Cancer of upper-outer quadrant of female breast    -  Primary      174.4             Reason for Visit      New Evaluation      Right breast cancer-new               Current Vitals Most recent update: 11/11/2013  4:00 PM by Elwyn Lade, CMA      BP Pulse Temp(Src) Resp Ht Wt      110/70 78 97.8 F (36.6 C) (Temporal) 14 6' (1.829 m) 129 lb 3.2 oz (58.605 kg)      BMI 17.52 kg/m2                     History and Physical     Adin Hector, MD     Status: Addendum            Patient ID: Melissa Oconnell, female   DOB: March 21, 1956, 58 y.o.   MRN: [id number]              Note: This dictation was prepared with Dragon/digital dictation along with [facility] technology. Any transcriptional errors that result from this process are unintentional.   HPI Melissa Oconnell is a 58 y.o. female.  She is referred by Dr. Joaquim Lai at the breast center [facility] for evaluation and surgical management of a newly diagnosed invasive cancer right breast, upper outer quadrant.    This patient has fairly severe multiple sclerosis. She is ambulatory but prefers to use a walker. She is thin and her performance status is not that good. She lives in a house where there is a caregiver around-the-clock. Her caregiver is with her today.   The patient has no prior history of breast problems. She recently felt a lump in the upper outer quadrant of her right breast. Imaging studies show a 2 cm mass in the right breast at the 10:00 position, 5 cm from the nipple. 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 ER-positive 100%, PR neg-0%, Ki 67 60%, HER-2 negative. MRI shows a solitary mass in the upper outer quadrant right breast, 2.2 cm diameter, otherwise negative  breast MRI.   Comorbidities include multiple sclerosis,  accidental fall, memory loss, gait disorder. She takes no medications.   Family history - her mother was diagnosed with breast cancer in postmenopausal state. Otherwise family history negative for breast or ovarian cancer.        Past Medical History   Diagnosis  Date   .  Multiple sclerosis     .  Memory loss     .  Abnormality of gait          History reviewed. No pertinent past surgical history.    Family History   Problem  Relation  Age of Onset   .  Cancer  Mother         breast        Social History History   Substance Use Topics   .  Smoking status:  Former Research scientist (life sciences)   .  Smokeless tobacco:  Never Used   .  Alcohol Use:  No  No Known Allergie     Review of Systems   Constitutional: Positive for unexpected weight change. Negative for fever and chills.  HENT: Negative for congestion, hearing loss, sore throat, trouble swallowing and voice change.   Eyes: Negative for visual disturbance.  Respiratory: Negative for cough and wheezing.   Cardiovascular: Negative for chest pain, palpitations and leg swelling.  Gastrointestinal: Negative for nausea, vomiting, abdominal pain, diarrhea, constipation, blood in stool, abdominal distention and anal bleeding.  Genitourinary: Negative for hematuria, vaginal bleeding and difficulty urinating.  Musculoskeletal: Positive for gait problem. Negative for arthralgias.  Skin: Negative for rash and wound.  Neurological: Positive for weakness. Negative for seizures, syncope and headaches.  Hematological: Negative for adenopathy. Does not bruise/bleed easily.  Psychiatric/Behavioral: Negative for confusion.      Blood pressure 110/70, pulse 78, temperature 97.8 F (36.6 C), temperature source Temporal, resp. rate 14, height 6' (1.829 m), weight 129 lb 3.2 oz (58.605 kg).    Physical Exam   Constitutional: She is oriented to person, place, and time. She appears well-developed and well-nourished. No distress.  Alert  and cooperative. Appear somewhat feeble and thin. Performance status is not that good.has a walker. Her caregiver is with her throughout the encounter. Her caregiver participates in this in the discussion.  HENT:   Head: Normocephalic and atraumatic.   Nose: Nose normal.   Mouth/Throat: No oropharyngeal exudate.  Eyes: Conjunctivae and EOM are normal. Pupils are equal, round, and reactive to light. Left eye exhibits no discharge. No scleral icterus.  Neck: Neck supple. No JVD present. No tracheal deviation present. No thyromegaly present.  Cardiovascular: Normal rate, regular rhythm, normal heart sounds and intact distal pulses.    No murmur heard. Pulmonary/Chest: Effort normal and breath sounds normal. No respiratory distress. She has no wheezes. She has no rales. She exhibits no tenderness.    Breasts are small and atrophic. There is a 2.5 cm palpable mass high in the upper outer quadrant of the right breast. I don't feel any other masses in either breast. I don't feel any axillary adenopathy on either side. Skin is paper thin but without neoplastic lesions.  Abdominal: Soft. Bowel sounds are normal. She exhibits no distension and no mass. There is no tenderness. There is no rebound and no guarding.  Musculoskeletal: She exhibits no edema and no tenderness.  Lymphadenopathy:    She has no cervical adenopathy.  Neurological: She is alert and oriented to person, place, and time. She exhibits normal muscle tone. Coordination normal.  Skin: Skin is warm. No rash noted. She is not diaphoretic. No erythema. No pallor.  Psychiatric: She has a normal mood and affect. Her behavior is normal. Judgment and thought content normal.      Data Reviewed Imaging studies. 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 Clinical stage TII, N0   Decreased performance status and disability secondary to multiple  sclerosis   Gait disturbance   Family history breast cancer in mother      Plan    I had a very long talk with the patient and her caregiver. We talked about a lumpectomy, mastectomy, SLN biopsy, radiation therapy, antiestrogen therapy. We talked about involving medical oncology and radiation oncology in her care. She was offered referral to medical oncology and  Radiation oncology reop. Because of the significant deformity that would occur with a lumpectomy, and because of the hardships that might occur from  daily radiation therapy, the patient stated she wanted to simply have a mastectomy and sentinel node biopsy. I agree, and think that  would probably be in her best interest.   She'll be referred to medical oncology postop. She declined referral preop   We briefly mentioned plastic surgical reconstruction, but she clearly stated that was not something she was interested in.   She'll be scheduled for a right total mastectomy with right axillary sentinel the biopsy in the near future.   I discussed the indications, details, techniques, and numerous risk of the surgery with her and her caregiver. There was a risk of bleeding, infection, skin necrosis, recurrence of the cancer, shoulder contracture, medical complications, and other proceed problems. She understands these issues well. At this time all her questions are answered. She agrees with this plan.           Edsel Petrin. Dalbert Batman, M.D., [facility] General and Minimally invasive Surgery Breast and Colorectal Surgery Office:   [phone] Pager:   [phone]

## 2013-12-01 MED ORDER — CEFAZOLIN SODIUM-DEXTROSE 2-3 GM-% IV SOLR
2.0000 g | INTRAVENOUS | Status: AC
  Administered 2013-12-02: 2 g via INTRAVENOUS
  Filled 2013-12-01: qty 50

## 2013-12-01 MED ORDER — CHLORHEXIDINE GLUCONATE 4 % EX LIQD
1.0000 "application " | Freq: Once | CUTANEOUS | Status: DC
  Filled 2013-12-01: qty 15

## 2013-12-02 ENCOUNTER — Ambulatory Visit (HOSPITAL_COMMUNITY): Payer: Medicare Other | Admitting: Anesthesiology

## 2013-12-02 ENCOUNTER — Encounter (HOSPITAL_COMMUNITY): Payer: Self-pay | Admitting: *Deleted

## 2013-12-02 ENCOUNTER — Encounter (HOSPITAL_COMMUNITY)
Admission: RE | Disposition: A | Discharge status: Home / Self Care | Payer: Self-pay | Source: Ambulatory Visit | Attending: General Surgery

## 2013-12-02 ENCOUNTER — Encounter (HOSPITAL_COMMUNITY): Payer: Medicare Other | Admitting: Anesthesiology

## 2013-12-02 ENCOUNTER — Ambulatory Visit (HOSPITAL_COMMUNITY)
Admission: RE | Admit: 2013-12-02 | Discharge: 2013-12-02 | Disposition: A | Discharge status: Home / Self Care | Payer: Medicare Other | Source: Ambulatory Visit | Attending: General Surgery | Admitting: General Surgery

## 2013-12-02 ENCOUNTER — Ambulatory Visit (HOSPITAL_COMMUNITY)
Admission: RE | Admit: 2013-12-02 | Discharge: 2013-12-04 | Disposition: A | Discharge status: Home / Self Care | Payer: Medicare Other | Source: Ambulatory Visit | Attending: General Surgery | Admitting: General Surgery

## 2013-12-02 DIAGNOSIS — Z87891 Personal history of nicotine dependence: Secondary | ICD-10-CM | POA: Insufficient documentation

## 2013-12-02 DIAGNOSIS — D0590 Unspecified type of carcinoma in situ of unspecified breast: Secondary | ICD-10-CM

## 2013-12-02 DIAGNOSIS — C50419 Malignant neoplasm of upper-outer quadrant of unspecified female breast: Secondary | ICD-10-CM | POA: Insufficient documentation

## 2013-12-02 DIAGNOSIS — K5900 Constipation, unspecified: Secondary | ICD-10-CM | POA: Insufficient documentation

## 2013-12-02 DIAGNOSIS — G35 Multiple sclerosis: Secondary | ICD-10-CM | POA: Insufficient documentation

## 2013-12-02 DIAGNOSIS — Z79899 Other long term (current) drug therapy: Secondary | ICD-10-CM | POA: Insufficient documentation

## 2013-12-02 DIAGNOSIS — R269 Unspecified abnormalities of gait and mobility: Secondary | ICD-10-CM | POA: Insufficient documentation

## 2013-12-02 DIAGNOSIS — Z803 Family history of malignant neoplasm of breast: Secondary | ICD-10-CM | POA: Insufficient documentation

## 2013-12-02 DIAGNOSIS — Z853 Personal history of malignant neoplasm of breast: Secondary | ICD-10-CM | POA: Diagnosis present

## 2013-12-02 HISTORY — DX: Malignant (primary) neoplasm, unspecified: C80.1

## 2013-12-02 HISTORY — PX: MASTECTOMY W/ SENTINEL NODE BIOPSY: SHX2001

## 2013-12-02 LAB — CBC
HCT: 36.8 % (ref 36.0–46.0)
Hemoglobin: 12.1 g/dL (ref 12.0–15.0)
MCH: 31 pg (ref 26.0–34.0)
MCHC: 32.9 g/dL (ref 30.0–36.0)
MCV: 94.4 fL (ref 78.0–100.0)
PLATELETS: 210 10*3/uL (ref 150–400)
RBC: 3.9 MIL/uL (ref 3.87–5.11)
RDW: 12.6 % (ref 11.5–15.5)
WBC: 9.1 10*3/uL (ref 4.0–10.5)

## 2013-12-02 LAB — CREATININE, SERUM
Creatinine, Ser: 0.74 mg/dL (ref 0.50–1.10)
GFR calc non Af Amer: >90 mL/min (ref >90)

## 2013-12-02 SURGERY — MASTECTOMY WITH SENTINEL LYMPH NODE BIOPSY
Anesthesia: General | Site: Breast | Laterality: Right

## 2013-12-02 MED ORDER — ONDANSETRON HCL 4 MG/2ML IJ SOLN
INTRAMUSCULAR | Status: DC | PRN
  Administered 2013-12-02: 4 mg via INTRAVENOUS

## 2013-12-02 MED ORDER — PROPOFOL 10 MG/ML IV BOLUS
INTRAVENOUS | Status: AC
  Filled 2013-12-02: qty 20

## 2013-12-02 MED ORDER — TECHNETIUM TC 99M SULFUR COLLOID FILTERED
1.0000 | Freq: Once | INTRAVENOUS | Status: AC | PRN
  Administered 2013-12-02: 1 via INTRADERMAL

## 2013-12-02 MED ORDER — FENTANYL CITRATE 0.05 MG/ML IJ SOLN
50.0000 ug | INTRAMUSCULAR | Status: DC | PRN
  Administered 2013-12-02: 50 ug via INTRAVENOUS
  Filled 2013-12-02: qty 2

## 2013-12-02 MED ORDER — FENTANYL CITRATE 0.05 MG/ML IJ SOLN
12.5000 ug | INTRAMUSCULAR | Status: DC | PRN
  Administered 2013-12-02: 12.5 ug via INTRAVENOUS
  Filled 2013-12-02 (×2): qty 2

## 2013-12-02 MED ORDER — MIDAZOLAM HCL 2 MG/2ML IJ SOLN
INTRAMUSCULAR | Status: AC
  Filled 2013-12-02: qty 2

## 2013-12-02 MED ORDER — HYDROCODONE-ACETAMINOPHEN 5-325 MG PO TABS
1.0000 | ORAL_TABLET | ORAL | Status: DC | PRN
Start: 2013-12-02 — End: 2013-12-04
  Administered 2013-12-03 – 2013-12-04 (×6): 2 via ORAL
  Filled 2013-12-02 (×6): qty 2

## 2013-12-02 MED ORDER — PROPOFOL 10 MG/ML IV BOLUS
INTRAVENOUS | Status: DC | PRN
  Administered 2013-12-02: 170 mg via INTRAVENOUS

## 2013-12-02 MED ORDER — GLYCOPYRROLATE 0.2 MG/ML IJ SOLN
INTRAMUSCULAR | Status: DC | PRN
  Administered 2013-12-02: .7 mg via INTRAVENOUS

## 2013-12-02 MED ORDER — MIDAZOLAM HCL 2 MG/2ML IJ SOLN
1.0000 mg | INTRAMUSCULAR | Status: DC | PRN
  Administered 2013-12-02: 1 mg via INTRAVENOUS
  Filled 2013-12-02: qty 2

## 2013-12-02 MED ORDER — LACTATED RINGERS IV SOLN
INTRAVENOUS | Status: DC
  Administered 2013-12-02: 08:00:00 via INTRAVENOUS

## 2013-12-02 MED ORDER — HYDROMORPHONE HCL PF 1 MG/ML IJ SOLN
INTRAMUSCULAR | Status: AC
  Filled 2013-12-02: qty 1

## 2013-12-02 MED ORDER — BUPIVACAINE-EPINEPHRINE (PF) 0.5% -1:200000 IJ SOLN
INTRAMUSCULAR | Status: AC
  Filled 2013-12-02: qty 30

## 2013-12-02 MED ORDER — ONDANSETRON HCL 4 MG/2ML IJ SOLN: 4.0000 mg | Freq: Four times a day (QID) | INTRAMUSCULAR | Status: DC | PRN

## 2013-12-02 MED ORDER — 0.9 % SODIUM CHLORIDE (POUR BTL) OPTIME
TOPICAL | Status: DC | PRN
  Administered 2013-12-02 (×2): 1000 mL

## 2013-12-02 MED ORDER — HYDROMORPHONE HCL PF 1 MG/ML IJ SOLN
0.2500 mg | INTRAMUSCULAR | Status: DC | PRN
  Administered 2013-12-02 (×2): 0.25 mg via INTRAVENOUS

## 2013-12-02 MED ORDER — LIDOCAINE HCL (CARDIAC) 20 MG/ML IV SOLN
INTRAVENOUS | Status: DC | PRN
Start: 2013-12-02 — End: 2013-12-02
  Administered 2013-12-02: 50 mg via INTRAVENOUS

## 2013-12-02 MED ORDER — ROCURONIUM BROMIDE 100 MG/10ML IV SOLN
INTRAVENOUS | Status: DC | PRN
  Administered 2013-12-02: 40 mg via INTRAVENOUS

## 2013-12-02 MED ORDER — SODIUM CHLORIDE 0.9 % IJ SOLN
INTRAMUSCULAR | Status: DC | PRN
  Administered 2013-12-02: 09:00:00 via INTRAMUSCULAR

## 2013-12-02 MED ORDER — ENOXAPARIN SODIUM 40 MG/0.4ML ~~LOC~~ SOLN
40.0000 mg | SUBCUTANEOUS | Status: DC
  Administered 2013-12-03 – 2013-12-04 (×2): 40 mg via SUBCUTANEOUS
  Filled 2013-12-02 (×3): qty 0.4

## 2013-12-02 MED ORDER — LACTATED RINGERS IV SOLN
INTRAVENOUS | Status: DC | PRN
  Administered 2013-12-02 (×2): via INTRAVENOUS

## 2013-12-02 MED ORDER — METHYLENE BLUE 1 % INJ SOLN
INTRAMUSCULAR | Status: AC
  Filled 2013-12-02: qty 10

## 2013-12-02 MED ORDER — FENTANYL CITRATE 0.05 MG/ML IJ SOLN
INTRAMUSCULAR | Status: DC | PRN
  Administered 2013-12-02: 100 ug via INTRAVENOUS

## 2013-12-02 MED ORDER — ONDANSETRON HCL 4 MG PO TABS: 4.0000 mg | ORAL_TABLET | Freq: Four times a day (QID) | ORAL | Status: DC | PRN

## 2013-12-02 MED ORDER — NEOSTIGMINE METHYLSULFATE 10 MG/10ML IV SOLN
INTRAVENOUS | Status: DC | PRN
  Administered 2013-12-02: 4 mg via INTRAVENOUS

## 2013-12-02 MED ORDER — POTASSIUM CHLORIDE IN NACL 20-0.9 MEQ/L-% IV SOLN
INTRAVENOUS | Status: DC
  Administered 2013-12-02 – 2013-12-03 (×2): via INTRAVENOUS
  Filled 2013-12-02 (×5): qty 1000

## 2013-12-02 MED ORDER — FENTANYL CITRATE 0.05 MG/ML IJ SOLN
INTRAMUSCULAR | Status: AC
  Filled 2013-12-02: qty 5

## 2013-12-02 MED ORDER — SODIUM CHLORIDE 0.9 % IJ SOLN
INTRAMUSCULAR | Status: AC
  Filled 2013-12-02: qty 10

## 2013-12-02 MED ORDER — CEFAZOLIN SODIUM-DEXTROSE 2-3 GM-% IV SOLR
2.0000 g | Freq: Three times a day (TID) | INTRAVENOUS | Status: AC
  Administered 2013-12-02: 2 g via INTRAVENOUS
  Filled 2013-12-02: qty 50

## 2013-12-02 SURGICAL SUPPLY — 58 items
ADH SKN CLS APL DERMABOND .7 (GAUZE/BANDAGES/DRESSINGS) ×2
APPLIER CLIP 9.375 MED OPEN (MISCELLANEOUS) ×3
APR CLP MED 9.3 20 MLT OPN (MISCELLANEOUS) ×1
BINDER BREAST LRG (GAUZE/BANDAGES/DRESSINGS) ×2 IMPLANT
BINDER BREAST XLRG (GAUZE/BANDAGES/DRESSINGS) IMPLANT
BIOPATCH RED 1 DISK 7.0 (GAUZE/BANDAGES/DRESSINGS) ×2 IMPLANT
BIOPATCH RED 1IN DISK 7.0MM (GAUZE/BANDAGES/DRESSINGS) ×2
CANISTER SUCTION 2500CC (MISCELLANEOUS) ×3 IMPLANT
CHLORAPREP W/TINT 26ML (MISCELLANEOUS) ×3 IMPLANT
CLIP APPLIE 9.375 MED OPEN (MISCELLANEOUS) ×1 IMPLANT
CONT SPEC 4OZ CLIKSEAL STRL BL (MISCELLANEOUS) ×5 IMPLANT
COVER PROBE W GEL 5X96 (DRAPES) ×3 IMPLANT
COVER SURGICAL LIGHT HANDLE (MISCELLANEOUS) ×3 IMPLANT
DERMABOND ADVANCED (GAUZE/BANDAGES/DRESSINGS) ×4
DERMABOND ADVANCED .7 DNX12 (GAUZE/BANDAGES/DRESSINGS) ×1 IMPLANT
DRAIN CHANNEL 19F RND (DRAIN) ×5 IMPLANT
DRAPE LAPAROSCOPIC ABDOMINAL (DRAPES) ×3 IMPLANT
DRAPE PROXIMA HALF (DRAPES) ×3 IMPLANT
DRAPE UTILITY 15X26 W/TAPE STR (DRAPE) ×6 IMPLANT
DRSG PAD ABDOMINAL 8X10 ST (GAUZE/BANDAGES/DRESSINGS) ×2 IMPLANT
DRSG TEGADERM 4X4.75 (GAUZE/BANDAGES/DRESSINGS) ×2 IMPLANT
ELECT BLADE 4.0 EZ CLEAN MEGAD (MISCELLANEOUS) ×3
ELECT CAUTERY BLADE 6.4 (BLADE) ×3 IMPLANT
ELECT REM PT RETURN 9FT ADLT (ELECTROSURGICAL) ×3
ELECTRODE BLDE 4.0 EZ CLN MEGD (MISCELLANEOUS) ×1 IMPLANT
ELECTRODE REM PT RTRN 9FT ADLT (ELECTROSURGICAL) ×1 IMPLANT
EVACUATOR SILICONE 100CC (DRAIN) ×5 IMPLANT
GLOVE BIO SURGEON STRL SZ7.5 (GLOVE) ×2 IMPLANT
GLOVE BIOGEL PI IND STRL 6.5 (GLOVE) IMPLANT
GLOVE BIOGEL PI IND STRL 7.5 (GLOVE) IMPLANT
GLOVE BIOGEL PI INDICATOR 6.5 (GLOVE) ×2
GLOVE BIOGEL PI INDICATOR 7.5 (GLOVE) ×4
GLOVE ECLIPSE 7.5 STRL STRAW (GLOVE) ×2 IMPLANT
GLOVE EUDERMIC 7 POWDERFREE (GLOVE) ×3 IMPLANT
GLOVE SURG SS PI 6.5 STRL IVOR (GLOVE) ×2 IMPLANT
GLOVE SURG SS PI 7.0 STRL IVOR (GLOVE) ×2 IMPLANT
GOWN STRL REUS W/ TWL LRG LVL3 (GOWN DISPOSABLE) ×2 IMPLANT
GOWN STRL REUS W/ TWL XL LVL3 (GOWN DISPOSABLE) ×1 IMPLANT
GOWN STRL REUS W/TWL LRG LVL3 (GOWN DISPOSABLE) ×9
GOWN STRL REUS W/TWL XL LVL3 (GOWN DISPOSABLE) ×3
KIT BASIN OR (CUSTOM PROCEDURE TRAY) ×3 IMPLANT
KIT ROOM TURNOVER OR (KITS) ×3 IMPLANT
NDL 18GX1X1/2 (RX/OR ONLY) (NEEDLE) ×1 IMPLANT
NDL HYPO 25GX1X1/2 BEV (NEEDLE) ×1 IMPLANT
NEEDLE 18GX1X1/2 (RX/OR ONLY) (NEEDLE) ×3 IMPLANT
NEEDLE HYPO 25GX1X1/2 BEV (NEEDLE) ×3 IMPLANT
NS IRRIG 1000ML POUR BTL (IV SOLUTION) ×3 IMPLANT
PACK GENERAL/GYN (CUSTOM PROCEDURE TRAY) ×3 IMPLANT
PAD ARMBOARD 7.5X6 YLW CONV (MISCELLANEOUS) ×3 IMPLANT
PIN SAFETY STERILE (MISCELLANEOUS) ×2 IMPLANT
SPECIMEN JAR LARGE (MISCELLANEOUS) ×2 IMPLANT
SPECIMEN JAR X LARGE (MISCELLANEOUS) ×1 IMPLANT
SUT ETHILON 3 0 FSL (SUTURE) ×5 IMPLANT
SUT MNCRL AB 4-0 PS2 18 (SUTURE) ×3 IMPLANT
SUT SILK 2 0 FS (SUTURE) ×3 IMPLANT
SUT VIC AB 3-0 SH 18 (SUTURE) ×7 IMPLANT
SYR CONTROL 10ML LL (SYRINGE) ×3 IMPLANT
TOWEL OR 17X26 10 PK STRL BLUE (TOWEL DISPOSABLE) ×3 IMPLANT

## 2013-12-02 NOTE — Anesthesia Postprocedure Evaluation (Signed)
  Anesthesia Post-op Note  Patient: Melissa Oconnell  Procedure(s) Performed: Procedure(s): TOTAL MASTECTOMY WITH SENTINEL LYMPH NODE BIOPSY (Right)  Patient Location: PACU  Anesthesia Type:General  Level of Consciousness: awake  Airway and Oxygen Therapy: Patient Spontanous Breathing  Post-op Pain: mild  Post-op Assessment: Post-op Vital signs reviewed  Post-op Vital Signs: Reviewed  Last Vitals:  Filed Vitals:   12/02/13 0830  BP: 113/69  Pulse: 50  Temp:   Resp: 13    Complications: No apparent anesthesia complications

## 2013-12-02 NOTE — Progress Notes (Signed)
Pt awake, watching tv, family at bedside, VSS.

## 2013-12-02 NOTE — Anesthesia Preprocedure Evaluation (Signed)
Anesthesia Evaluation  Patient identified by MRN, date of birth, ID band Patient awake    Reviewed: Allergy & Precautions, H&P , NPO status , Patient's Chart, lab work & pertinent test results  Airway Mallampati: I      Dental   Pulmonary former smoker,          Cardiovascular Rhythm:Regular Rate:Normal     Neuro/Psych    GI/Hepatic negative GI ROS, Neg liver ROS,   Endo/Other    Renal/GU negative Renal ROS     Musculoskeletal   Abdominal   Peds  Hematology   Anesthesia Other Findings   Reproductive/Obstetrics                           Anesthesia Physical Anesthesia Plan  ASA: III  Anesthesia Plan: General   Post-op Pain Management:    Induction: Intravenous  Airway Management Planned: Oral ETT  Additional Equipment:   Intra-op Plan:   Post-operative Plan: Extubation in OR  Informed Consent: I have reviewed the patients History and Physical, chart, labs and discussed the procedure including the risks, benefits and alternatives for the proposed anesthesia with the patient or authorized representative who has indicated his/her understanding and acceptance.   Dental advisory given  Plan Discussed with: CRNA and Anesthesiologist  Anesthesia Plan Comments:         Anesthesia Quick Evaluation

## 2013-12-02 NOTE — Anesthesia Procedure Notes (Signed)
Procedure Name: Intubation Date/Time: 12/02/2013 9:12 AM Performed by: Eligha Bridegroom Pre-anesthesia Checklist: Emergency Drugs available, Timeout performed, Suction available, Patient identified and Patient being monitored Patient Re-evaluated:Patient Re-evaluated prior to inductionOxygen Delivery Method: Circle system utilized Preoxygenation: Pre-oxygenation with 100% oxygen Intubation Type: IV induction Ventilation: Mask ventilation without difficulty Laryngoscope Size: Mac and 3 Grade View: Grade I Tube type: Oral Tube size: 7.0 mm Number of attempts: 2 Airway Equipment and Method: Stylet Placement Confirmation: ETT inserted through vocal cords under direct vision,  breath sounds checked- equal and bilateral and positive ETCO2 Secured at: 22 cm Tube secured with: Tape Dental Injury: Teeth and Oropharynx as per pre-operative assessment

## 2013-12-02 NOTE — Transfer of Care (Signed)
Immediate Anesthesia Transfer of Care Note  Patient: Melissa Oconnell  Procedure(s) Performed: Procedure(s): TOTAL MASTECTOMY WITH SENTINEL LYMPH NODE BIOPSY (Right)  Patient Location: PACU  Anesthesia Type:General  Level of Consciousness: awake, alert  and oriented  Airway & Oxygen Therapy: Patient Spontanous Breathing and Patient connected to nasal cannula oxygen  Post-op Assessment: Report given to PACU RN and Post -op Vital signs reviewed and stable  Post vital signs: Reviewed and stable  Complications: No apparent anesthesia complications

## 2013-12-02 NOTE — Interval H&P Note (Signed)
History and Physical Interval Note:  12/02/2013 7:21 AM  Melissa Oconnell  has presented today for surgery, with the diagnosis of right breast cancer   The goals and the various methods of treatment have been discussed with the patient and family. After consideration of risks, benefits and other options for treatment, the patient has consented to  Procedure(s): MASTECTOMY WITH SENTINEL LYMPH NODE BIOPSY (Right) as a surgical intervention .  The patient's history has been reviewed, patient examined today, no change in status, stable for surgery.  I have reviewed the patient's chart and labs.  Questions were answered to the patient's satisfaction.     Adin Hector

## 2013-12-02 NOTE — Op Note (Signed)
Patient Name:           Melissa Oconnell   Date of Surgery:        12/02/2013  Note: This dictation was prepared with Dragon/digital dictation along with [facility] technology. Any transcriptional errors that result from this process are unintentional.   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 Clinical stage T2, N0     Post op Diagnosis:    same  Procedure:                 Inject blue dye right breast Right total mastectomy Right axillary sentinel node biopsy  Surgeon:                     Edsel Petrin. Dalbert Batman, M.D., FACS  Assistant:                      RNFA  Operative Indications:   Melissa Oconnell is a 57 y.o. female. She is referred by Dr. Joaquim Lai at the breast center [facility] for evaluation and surgical management of a newly diagnosed invasive cancer right breast, upper outer quadrant.  This patient has fairly severe multiple sclerosis. She is ambulatory but prefers to use a walker. She is thin and her performance status is not that good. She lives in a house where there is a caregiver and POA around-the-clock.  The patient has no prior history of breast problems. She recently felt a lump in the upper outer quadrant of her right breast. Imaging studies show a 2 cm mass in the right breast at the 10:00 position, 5 cm from the nipple. 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 ER-positive 100%, PR neg-0%, Ki 67 60%, HER-2 negative. MRI shows a solitary mass in the upper outer quadrant right breast, 2.2 cm diameter, otherwise negative breast MRI. We discussed all the options available to her in the office. O.'s discussed in breast tumor board.Consensus decision was to proceed with right mastectomy and sentinel node biopsy. Comorbidities include multiple sclerosis, accidental fall, memory loss, gait disorder. She takes no medications.  Family history - her mother was diagnosed with breast  cancer in postmenopausal state. Otherwise family history negative for breast or ovarian cancer.   Operative Findings:       Obvious palpable mass in the right breast, upper outer quadrant. This did not appear to have invaded the skin or the muscle and was removed cleanly. I found 2 sentinel lymph nodes.  Procedure in Detail:          Following the induction of general endotracheal anesthesia surgical time out was performed and intravenous antibiotics were given. Following alcohol prep I injected 5 cc of blue dye into the right breast, 2 cc of methylene blue mixed with 3 cc of saline,  subareolar area, and massaged the breast for a few minutes. It should be noted that radiopharmaceutical was injected on the right side by the nuclear medicine technician in the holding area.      The right breast was then prepped and draped in a sterile fashion. This included the axilla and chest wall. Using a marking pen I marked a transverse, somewhat oblique incision, being somewhat higher laterally and somewhat lower medially. This incision was made with a knife. Skin flaps were raised superiorly to the infraclavicular area, medially to the parasternal area, inferiorly to the inframammary crease and anterior rectus sheath, and  laterally to the latissimus dorsi muscle. The I carried the dissection up into the axilla a little bit. Using the neoprobe I found 2 very hot, very blue lymph nodes and removed these as sentinel nodes. After this was done there was little radioactivity left. The breast was then dissected off of the pectoralis major and minor muscles. The lateral skin margin was marked with a suture & the specimen sent to the pathology lab. Hemostasis was excellent and achieved with electrocautery and a few metal clips. It was irrigated with saline. Two 108 French Blake drains were placed, one up into the axilla and one across the skin flaps. These were brought out through 2 separate stab incisions inferolaterally,  sutured to the skin and connected to suction bulbs. Subcutaneous tissues closed with interrupted sutures of 3-0 Vicryl and the skin closed a running subcuticular suture of 4-0 Monocryl and Dermabond. A breast binder was placed the patient taken to PACU in stable condition. EBL 75 cc or less. Counts correct. Computations none.           Edsel Petrin. Dalbert Batman, M.D., FACS General and Minimally Invasive Surgery Breast and Colorectal Surgery  12/02/2013 10:29 AM

## 2013-12-02 NOTE — Progress Notes (Signed)
Pt awake. States knee no longer hurts--just her back--she repostioned herself and states it too feels better.

## 2013-12-02 NOTE — Progress Notes (Signed)
Previous note on this column made on wrong pt. Ms. Dufresne denies pain

## 2013-12-03 MED ORDER — POLYETHYLENE GLYCOL 3350 17 G PO PACK
17.0000 g | PACK | Freq: Two times a day (BID) | ORAL | Status: DC
  Administered 2013-12-03 – 2013-12-04 (×3): 17 g via ORAL
  Filled 2013-12-03 (×4): qty 1

## 2013-12-03 NOTE — Evaluation (Signed)
Physical Therapy Evaluation Patient Details Name: Melissa Oconnell MRN: [id number] DOB: May 29, 1956 Today's Date: 12/03/2013   History of Present Illness  pt is a 58 yo female admited 5/19 for Right total mastectomy  and Right axillary sentinel node biopsy. Pt with h/o advanced MS and memory deficits.   Clinical Impression  Pt functioning near baseline. Pt with 24/7 caregivers, good home set up and all equip recommended. Pt safe to d/c home with con't 24/7 assist once medically stable.     Follow Up Recommendations No PT follow up;Supervision/Assistance - 24 hour    Equipment Recommendations  None recommended by PT    Recommendations for Other Services       Precautions / Restrictions Precautions Precautions: Fall Restrictions Weight Bearing Restrictions: No      Mobility  Bed Mobility Overal bed mobility: Needs Assistance Bed Mobility: Supine to Sit     Supine to sit: Min assist     General bed mobility comments: assist to bring hips to EOB  Transfers Overall transfer level: Needs assistance Equipment used: Rolling walker (2 wheeled) Transfers: Sit to/from [facility] Sit to Stand: Min assist Stand pivot transfers: Min assist       General transfer comment: v/c's for hand placement, increased time  Ambulation/Gait Ambulation/Gait assistance: Min assist Ambulation Distance (Feet): 20 Feet Assistive device: Rolling walker (2 wheeled) Gait Pattern/deviations: Step-through pattern;Decreased stride length;Trunk flexed Gait velocity: slow   General Gait Details: pt with freq standing rest breaks, v/c's for walker management around obstacles in room  Stairs            Wheelchair Mobility    Modified Rankin (Stroke Patients Only)       Balance Overall balance assessment: Needs assistance Sitting-balance support: Feet supported;No upper extremity supported Sitting balance-Leahy Scale: Fair Sitting balance - Comments: however increased  trunk flex   Standing balance support: Bilateral upper extremity supported Standing balance-Leahy Scale: Poor Standing balance comment: requires UE support for safe standing balance                             Pertinent Vitals/Pain Reports surgical pain at R chest, RN provided pain medicine    Home Living Family/patient expects to be discharged to:: Private residence Living Arrangements:  (caregivers) Available Help at Discharge: Personal care attendant;Available 24 hours/day Type of Home: House Home Access: Ramped entrance     Home Layout: One level Home Equipment: Walker - 2 wheels;Bedside commode;Shower seat;Grab bars - toilet;Grab bars - tub/shower;Hand held shower head;Wheelchair - manual      Prior Function Level of Independence: Needs assistance   Gait / Transfers Assistance Needed: used walker in house with supervision, w/c for community mobiltiy  ADL's / Homemaking Assistance Needed: assist for all ADLs and IADLs.        Hand Dominance        Extremity/Trunk Assessment   Upper Extremity Assessment: Generalized weakness           Lower Extremity Assessment: Generalized weakness (R LE ROM mildly limited due to masectomy)      Cervical / Trunk Assessment: Kyphotic  Communication   Communication: No difficulties  Cognition Arousal/Alertness: Awake/alert Behavior During Therapy: Flat affect Overall Cognitive Status: History of cognitive impairments - at baseline       Memory: Decreased short-term memory              General Comments General comments (skin integrity, edema, etc.): pt with  request to use BSC. with set up pt able to perform hygiene    Exercises        Assessment/Plan    PT Assessment Patient needs continued PT services  PT Diagnosis Difficulty walking;Generalized weakness   PT Problem List Decreased strength;Decreased activity tolerance;Decreased balance;Decreased mobility  PT Treatment Interventions Gait  training;Functional mobility training;Therapeutic activities;Therapeutic exercise;DME instruction   PT Goals (Current goals can be found in the Care Plan section) Acute Rehab PT Goals PT Goal Formulation: With patient Time For Goal Achievement: 12/10/13 Potential to Achieve Goals: Good    Frequency Min 2X/week   Barriers to discharge        Co-evaluation               End of Session Equipment Utilized During Treatment: Gait belt Activity Tolerance: Patient tolerated treatment well Patient left: in chair;with call bell/phone within reach Nurse Communication: Mobility status    Functional Assessment Tool Used: clinical judgement Functional Limitation: Mobility: Walking and moving around Mobility: Walking and Moving Around Current Status (F0932): At least 40 percent but less than 60 percent impaired, limited or restricted Mobility: Walking and Moving Around Goal Status [phone]): At least 20 percent but less than 40 percent impaired, limited or restricted    Time: 1009-1036 PT Time Calculation (min): 27 min   Charges:   PT Evaluation $Initial PT Evaluation Tier I: 1 Procedure PT Treatments $Gait Training: 8-22 mins   PT G Codes:   Functional Assessment Tool Used: clinical judgement Functional Limitation: Mobility: Walking and moving around    [employer] 12/03/2013, 10:50 AM  Kittie Plater, PT, DPT Pager #: [phone] Office #: [phone]

## 2013-12-03 NOTE — Progress Notes (Addendum)
1 Day Post-Op  Subjective: Patient stable and alert. Minimal pain. Vital signs stable. Somewhat feeble. Requires lots of assistance from bed to commode. Has a walker in room but I'm not sure she's used it yet. And voiding okay. Complaints of constipation.  Objective: Vital signs in last 24 hours: Temp:  [97.6 F (36.4 C)-99.3 F (37.4 C)] 98.7 F (37.1 C) (05/20 0100) Pulse Rate:  [48-63] 58 (05/20 0100) Resp:  [7-20] 12 (05/20 0100) BP: (96-152)/(60-87) 100/61 mmHg (05/20 0100) SpO2:  [100 %] 100 % (05/20 0100) Weight:  [128 lb (58.06 kg)-128 lb 9.6 oz (58.333 kg)] 128 lb (58.06 kg) (05/19 0717)    Intake/Output from previous day: 05/19 0701 - 05/20 0700 In: 1100 [I.V.:1100] Out: 150 [Drains:75; Blood:75] Intake/Output this shift: Total I/O In: -  Out: 5 [Drains:5]   EXAM General appearance: alert. Cooperative. No distress. seems oriented.Mental status at baseline.Toniann Ket Resp: clear to auscultation bilaterally Breasts: , right mastectomy skin flaps look good. Viable. No retained fluid or hematoma. Drainage serosanguineous. Both drains working.  Lab Results:  Results for orders placed during the hospital encounter of 12/02/13 (from the past 24 hour(s))  CBC     Status: None   Collection Time    12/02/13  4:34 PM      Result Value Ref Range   WBC 9.1  4.0 - 10.5 K/uL   RBC 3.90  3.87 - 5.11 MIL/uL   Hemoglobin 12.1  12.0 - 15.0 g/dL   HCT 36.8  36.0 - 46.0 %   MCV 94.4  78.0 - 100.0 fL   MCH 31.0  26.0 - 34.0 pg   MCHC 32.9  30.0 - 36.0 g/dL   RDW 12.6  11.5 - 15.5 %   Platelets 210  150 - 400 K/uL  CREATININE, SERUM     Status: None   Collection Time    12/02/13  4:34 PM      Result Value Ref Range   Creatinine, Ser 0.74  0.50 - 1.10 mg/dL   GFR calc non Af Amer >90  >90 mL/min   GFR calc Af Amer >90  >90 mL/min     Studies/Results: Nm Sentinel Node Inj-no Rpt (breast)  12/02/2013   CLINICAL DATA: cancer right breast   Sulfur colloid was injected  intradermally by the nuclear medicine  technologist for breast cancer sentinel node localization.     . enoxaparin (LOVENOX) injection  40 mg Subcutaneous Q24H  . polyethylene glycol  17 g Oral BID     Assessment/Plan: s/p Procedure(s): TOTAL MASTECTOMY WITH SENTINEL LYMPH NODE BIOPSY  POD #1. 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 Surgically stable. No wound problems  Multiple sclerosis. 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 Fall risk. She lives in a house with her POA and caregiver. PT consult, OT consult, case management consult.    Needs formal assessment prior to discharge. Discharge tomorrow if all assessments complete.  Chronic constipation. Twice a day MiraLAX is ordered.    @PROBHOSP @  LOS: 1 day    Kasmira Cacioppo M. Dalbert Batman, M.D., [facility] General and Minimally invasive Surgery Breast and Colorectal Surgery Office:   [phone] Pager:   [phone]  12/03/2013  . .prob

## 2013-12-03 NOTE — Care Management Note (Signed)
  Page 1 of 1   12/03/2013     1:19:16 PM CARE MANAGEMENT NOTE 12/03/2013  Patient:  JENISSA, TYRELL   Account Number:  [id number]  Date Initiated:  12/03/2013  Documentation initiated by:  Magdalen Spatz  Subjective/Objective Assessment:     Action/Plan:   Anticipated DC Date:     Anticipated DC Plan:  [place]         Choice offered to / List presented to:  C-2 HC POA / Guardian        HH arranged  HH-1 RN      [place].   Status of service:  Completed, signed off Medicare Important Message given?   (If response is "NO", the following Medicare IM given date fields will be blank) Date Medicare IM given:   Date Additional Medicare IM given:    Discharge Disposition:    Per UR Regulation:    If discussed at Long Length of Stay Meetings, dates discussed:    Comments:  12-03-13 Spoke with patient at bedside and Nita Sickle [phone] via phone . PT recommeding 24 hour supervision / assistance , Ms Theadore Nan states patinet has same . Magdalen Spatz RN BSN [phone]    12-03-13 Consult for home health needs , awaiting PT / OT notes . Magdalen Spatz RN BSN

## 2013-12-04 NOTE — Discharge Summary (Signed)
  Patient ID: Melissa Oconnell [id number] 58 y.o. Jul 19, 1955  Admit date: 12/02/2013  Discharge date and time: 12/04/2013  Admitting Physician: Adin Hector  Discharge Physician: Adin Hector  Admission Diagnoses: right breast cancer   Discharge Diagnoses: Invasive cancer, upper outer quadrant, right female breast. Multiple sclerosis with decreased performance status and disability Gait disturbance Family history breast cancer in mother.  Operations: Procedure(s): TOTAL MASTECTOMY WITH SENTINEL LYMPH NODE BIOPSY  Admission Condition: fair  Discharged Condition: fair  Indication for Admission: Melissa Oconnell is a 58 y.o. female. She is referred by Dr. Joaquim Lai at the breast center [facility] for evaluation and surgical management of a newly diagnosed invasive cancer right breast, upper outer quadrant.  This patient has fairly severe multiple sclerosis. She is ambulatory but prefers to use a walker. She is thin and her performance status is not that good. She lives in a house where there is a caregiver and POA around-the-clock.  The patient has no prior history of breast problems. She recently felt a lump in the upper outer quadrant of her right breast. Imaging studies show a 2 cm mass in the right breast at the 10:00 position, 5 cm from the nipple. 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 ER-positive 100%, PR neg-0%, Ki 67 60%, HER-2 negative. MRI shows a solitary mass in the upper outer quadrant right breast, 2.2 cm diameter, otherwise negative breast MRI. We discussed all the options available to her in the office. Her case was discussed in breast tumor board.Consensus decision was to proceed with right mastectomy and sentinel node biopsy.  Comorbidities include multiple sclerosis, accidental fall, memory loss, gait disorder. She takes no medications.  Family history - her mother was diagnosed with breast cancer in postmenopausal state. Otherwise family history  negative for breast or ovarian cancer.   Hospital Course: On the day of admission the patient was taken to the operating room and underwent a right total mastectomy and right sentinel lymph node biopsy. Pathology report is pending at this dictation. She was observed in the hospital for 2 nights because of her gait disorder and disability. She was seen by physical therapy and case management. It was felt she had adequate equipment at home and adequate 24-hour supervision. On the day of discharge the patient was doing well. She was comfortable with minimal pain. Was tolerating diet. Voiding uneventfully. She had a bowel movement following MiraLAX. Right mastectomy skin flaps were healthy and well vascularized there was no hematoma. Both drains were functioning with serosanguineous output. At discharge she was told to continue her usual medications and given a prescription for Norco if needed. Diet and wound care activities were discussed. She will return to see me in the office in one week.  Consults: PT, case management  Significant Diagnostic Studies: Surgical pathology, report pending  Treatments: surgery: Right total mastectomy with sentinel node biopsy  Disposition: Home  Patient Instructions:    Medication List         acetaminophen 325 MG tablet  Commonly known as:  TYLENOL  Take 325 mg by mouth every 6 (six) hours as needed for headache.        Activity: activity as tolerated Diet: low fat, low cholesterol diet Wound Care: as directed  Follow-up:  With Dr. Dalbert Batman in 1 week.  Signed: Edsel Petrin. Dalbert Batman, M.D., FACS General and minimally invasive surgery Breast and Colorectal Surgery  12/04/2013, 5:19 AM

## 2013-12-04 NOTE — Evaluation (Signed)
Occupational Therapy Evaluation Patient Details Name: [name] MRN: [id number] DOB: 04-04-1956 Today's Date: 12/04/2013    History of Present Illness pt is a 58 yo female admited 5/19 for Right total mastectomy  and Right axillary sentinel node biopsy. Pt with h/o advanced MS and memory deficits.    Clinical Impression   Pt is at baseline level of function for ADLs and ADL mobility and no further acute OT services are indicated at this time. OT will sign off    Follow Up Recommendations  No OT follow up;Supervision/Assistance - 24 hour    Equipment Recommendations  None recommended by OT;Other (comment) (has all necessary DME at home)    Recommendations for Other Services       Precautions / Restrictions Precautions Precautions: Fall      Mobility Bed Mobility Overal bed mobility: Needs Assistance Bed Mobility: Supine to Sit     Supine to sit: Min guard        Transfers Overall transfer level: Needs assistance Equipment used: Rolling walker (2 wheeled) Transfers: Sit to/from Stand Sit to Stand: Min assist         General transfer comment: v/c's for hand placement, increased time    Balance   Sitting-balance support: No upper extremity supported;Feet supported Sitting balance-Leahy Scale: Good     Standing balance support: Bilateral upper extremity supported;Single extremity supported;During functional activity Standing balance-Leahy Scale: Fair                              ADL Overall ADL's : At baseline                                             Vision  wears glasses at all times                   Perception Perception Perception Tested?: No   Praxis Praxis Praxis tested?: Not tested    Pertinent Vitals/Pain 5/10 surgical area pain, VSS     Hand Dominance Right   Extremity/Trunk Assessment Upper Extremity Assessment Upper Extremity Assessment: Generalized weakness   Lower Extremity  Assessment Lower Extremity Assessment: Defer to PT evaluation   Cervical / Trunk Assessment Cervical / Trunk Assessment: Kyphotic   Communication Communication Communication: No difficulties   Cognition Arousal/Alertness: Awake/alert Behavior During Therapy: WFL for tasks assessed/performed Overall Cognitive Status: History of cognitive impairments - at baseline                     General Comments   Pt pleasant and cooperative                 Home Living Family/patient expects to be discharged to:: Private residence Living Arrangements: Other (Comment) (has caregivers) Available Help at Discharge: Personal care attendant;Available 24 hours/day Type of Home: House Home Access: Ramped entrance     Home Layout: One level     Bathroom Shower/Tub: Occupational psychologist: Standard     Home Equipment: Environmental consultant - 2 wheels;Bedside commode;Shower seat;Grab bars - toilet;Grab bars - tub/shower;Hand held shower head;Wheelchair - manual          Prior Functioning/Environment Level of Independence: Needs assistance  Gait / Transfers Assistance Needed: used walker in house with supervision, w/c for community mobiltiy ADL's / Homemaking Assistance Needed: assist  for all ADLs and IADLs.        OT Diagnosis:     OT Problem List:     OT Treatment/Interventions:      OT Goals(Current goals can be found in the care plan section) Acute Rehab OT Goals Patient Stated Goal: to go home OT Goal Formulation: With patient  OT Frequency:     Barriers to D/C:  none                        End of Session Equipment Utilized During Treatment: Gait belt;Rolling walker  Activity Tolerance: Patient tolerated treatment well Patient left: in chair;with call bell/phone within reach   Time: 1014-1035 OT Time Calculation (min): 21 min Charges:  OT General Charges $OT Visit: 1 Procedure OT Evaluation $Initial OT Evaluation Tier I: 1 Procedure OT  Treatments $Therapeutic Activity: 8-22 mins G-Codes: OT G-codes **NOT FOR INPATIENT CLASS** Functional Limitation: Self care Self Care Current Status (B1517): At least 20 percent but less than 40 percent impaired, limited or restricted Self Care Discharge Status [phone]): At least 20 percent but less than 40 percent impaired, limited or restricted  Mosetta Putt 12/04/2013, 1:03 PM

## 2013-12-04 NOTE — Discharge Instructions (Signed)
See above.  Be sure to call and make an appointment to see Dr. Dalbert Batman in 7-8 days.

## 2013-12-05 ENCOUNTER — Encounter (HOSPITAL_COMMUNITY): Payer: Self-pay | Admitting: General Surgery

## 2013-12-09 ENCOUNTER — Telehealth (INDEPENDENT_AMBULATORY_CARE_PROVIDER_SITE_OTHER): Payer: Self-pay

## 2013-12-09 NOTE — Telephone Encounter (Signed)
Informed ADV/Sandra   PT lower body strengthening   Ordered per  DR Dalbert Batman

## 2013-12-09 NOTE — Telephone Encounter (Signed)
ADV/Sandra is asking for order for lower body PT for strengthening . Please advise # [phone]

## 2013-12-10 ENCOUNTER — Ambulatory Visit (INDEPENDENT_AMBULATORY_CARE_PROVIDER_SITE_OTHER): Payer: Medicare Other | Admitting: Nurse Practitioner

## 2013-12-10 ENCOUNTER — Encounter: Payer: Self-pay | Admitting: Nurse Practitioner

## 2013-12-10 ENCOUNTER — Telehealth (INDEPENDENT_AMBULATORY_CARE_PROVIDER_SITE_OTHER): Payer: Self-pay

## 2013-12-10 VITALS — BP 128/83 | HR 68 | Ht 72.0 in | Wt 130.0 lb

## 2013-12-10 DIAGNOSIS — G35 Multiple sclerosis: Secondary | ICD-10-CM

## 2013-12-10 DIAGNOSIS — R413 Other amnesia: Secondary | ICD-10-CM

## 2013-12-10 DIAGNOSIS — R269 Unspecified abnormalities of gait and mobility: Secondary | ICD-10-CM

## 2013-12-10 NOTE — Patient Instructions (Signed)
Continue home exercise program Continued healthydiet and adequate sleep Memory score is stable Followup yearly

## 2013-12-10 NOTE — Telephone Encounter (Signed)
LMOM for pt to call back for her path result attached from Dr Dalbert Batman.

## 2013-12-10 NOTE — Progress Notes (Signed)
Quick Note:  Inform patient of Pathology report,.Tell her the cancer was 1 inch in diameter and was completely removed and that all the lymph nodes are negative. This is good news.   hmi  ______

## 2013-12-10 NOTE — Telephone Encounter (Signed)
Message copied by Dois Davenport on Wed Dec 10, 2013  3:26 PM ------      Message from: Adin Hector      Created: Wed Dec 10, 2013  3:18 PM       Inform patient of Pathology report,.Tell her the cancer was 1 inch in diameter and was completely removed and that all the lymph nodes are negative. This is good news.                  hmi       ------

## 2013-12-10 NOTE — Progress Notes (Signed)
[facility]  PATIENT: Melissa Oconnell DOB: 1956/01/26   REASON FOR VISIT: Followup for secondary progressive Melissa    HISTORY OF PRESENT ILLNESS:Melissa Oconnell, 58 year old female returns for followup. She was last seen in our office 08/18/2013. She has a history of gait disorder secondary progressive multiple sclerosis. She has never been on any modulating therapy. She recently had right breast removal. She is seated in a wheelchair today, she uses a walker at home. Appetite is good and she is sleeping well. She has 24/7 care at home. She returns for reevaluation  HISTORY:She has a history of gait disorder which has been going on for several years,a POA That oversees her care. Patient only provides limited history. Most recent MRI of the brain shows multiple brainstem, cerebellar, subcortical and corpus callosum white matter hyperintensities suspicious for demyelinating disease. Presence of T1 black holes and atrophy indicate chronic disease. Patient has never been on any immune modulating therapy. Her mom and her sister both died of Melissa at early ages. She has received some physical therapy since last seen which was beneficial. She goes to [facility] a [facility]. She has had an abnormal visual evoked response. She had greater than 5 oligoclonal bands in her CSF with LP done 06/11/2012. She denies any falls since last seen. She is not using an assistive device. Her memory is stable  REVIEW OF SYSTEMS: Full 14 system review of systems performed and notable only for those listed, all others are neg:  Constitutional: N/A  Cardiovascular: N/A  Ear/Nose/Throat: N/A  Skin: N/A  Eyes: N/A  Respiratory: N/A  Gastroitestinal: N/A  Hematology/Lymphatic: N/A  Endocrine: N/A Musculoskeletal:N/A  Allergy/Immunology: N/A  Neurological: N/A Psychiatric: N/A Sleep : NA   ALLERGIES: No Known Allergies  HOME MEDICATIONS: Outpatient Prescriptions Prior to Visit  Medication Sig Dispense  Refill  . acetaminophen (TYLENOL) 325 MG tablet Take 325 mg by mouth every 6 (six) hours as needed for headache.       No facility-administered medications prior to visit.    PAST MEDICAL HISTORY: Past Medical History  Diagnosis Date  . Multiple sclerosis   . Memory loss   . Abnormality of gait   . Neuromuscular disorder     multiple sclerosis  . Cancer     breast    PAST SURGICAL HISTORY: Past Surgical History  Procedure Laterality Date  . Diagnostic laparoscopy    . Tonsillectomy    . Mastectomy w/ sentinel node biopsy Right 12/02/2013    Procedure: TOTAL MASTECTOMY WITH SENTINEL LYMPH NODE BIOPSY;  Surgeon: Adin Hector, MD;  Location: [facility];  Service: General;  Laterality: Right;    FAMILY HISTORY: Family History  Problem Relation Age of Onset  . Cancer Mother     breast    SOCIAL HISTORY: History   Social History  . Marital Status: Legally Separated    Spouse Name: N/A    Number of Children: 2  . Years of Education: N/A   Occupational History  . Not on file.   Social History Main Topics  . Smoking status: Former Smoker -- 1.00 packs/day for 5 years    Types: Cigarettes  . Smokeless tobacco: Never Used  . Alcohol Use: No  . Drug Use: No     Comment: quit "long time ago"  . Sexual Activity: Not on file   Other Topics Concern  . Not on file   Social History Narrative   Patient lives at home with Melissa Oconnell. Patient  has 2 children. Patient does not work.      PHYSICAL EXAM  Filed Vitals:   12/10/13 1341  BP: 128/83  Pulse: 68  Height: 6' (1.829 m)  Weight: 130 lb (58.968 kg)   Body mass index is 17.63 kg/(m^2).  Generalized: Well developed, in no acute distress  Head: normocephalic and atraumatic,. Oropharynx benign  Neck: Supple, no carotid bruits  Cardiac: Regular rate rhythm, no murmur  Musculoskeletal: No deformity   Neurological examination   Mentation: Alert , MMSE 17/30. AFT 7. Follows all commands speech and  language fluent  Cranial nerve II-XII: Pupils were equal round reactive to light extraocular movements were full, visual field were full on confrontational test. Facial sensation and strength were normal. hearing was intact to finger rubbing bilaterally. Uvula tongue midline. head turning and shoulder shrug were normal and symmetric.Tongue protrusion into cheek strength was normal. Motor: Mild bilateral lower extremities spasticity no significant weakness  Sensory: normal and symmetric to light touch, pinprick, and  vibration  Coordination: finger-nose-finger,  Apraxia noted Reflexes: Brachioradialis 2/2, biceps 2/2, triceps 2/2, patellar 3/3, Achilles 2/2, plantar responses were flexor bilaterally. Gait and Station: Seated in wheelchair   DIAGNOSTIC DATA (LABS, IMAGING, TESTING) - I reviewed patient records, labs, notes, testing and imaging myself where available.  Lab Results  Component Value Date   WBC 9.1 12/02/2013   HGB 12.1 12/02/2013   HCT 36.8 12/02/2013   MCV 94.4 12/02/2013   PLT 210 12/02/2013      Component Value Date/Time   NA 139 11/26/2013 1018   K 4.2 11/26/2013 1018   CL 103 11/26/2013 1018   CO2 27 11/26/2013 1018   GLUCOSE 86 11/26/2013 1018   BUN 15 11/26/2013 1018   CREATININE 0.74 12/02/2013 1634   CALCIUM 9.7 11/26/2013 1018   PROT 7.7 11/26/2013 1018   ALBUMIN 4.1 11/26/2013 1018   AST 16 11/26/2013 1018   ALT 10 11/26/2013 1018   ALKPHOS 78 11/26/2013 1018   BILITOT 0.3 11/26/2013 1018   GFRNONAA >90 12/02/2013 1634   GFRAA >90 12/02/2013 1634    ASSESSMENT AND PLAN  58 y.o. year old female  has a past medical history of Secondary progressive Multiple sclerosis; Memory loss; Abnormality of gait;  and Cancer. here to followup.  Continue home exercise program Continued healthy diet and adequate sleep Memory score is stable Followup yearly Dennie Bible, [facility], [facility], APRN  [facility] [address], [place], [place] [address] [phone]

## 2013-12-15 NOTE — Progress Notes (Signed)
OT eval addendum Late Entry    Melissa Oconnell, OT

## 2013-12-18 ENCOUNTER — Encounter (INDEPENDENT_AMBULATORY_CARE_PROVIDER_SITE_OTHER): Payer: Self-pay | Admitting: General Surgery

## 2013-12-18 ENCOUNTER — Other Ambulatory Visit (INDEPENDENT_AMBULATORY_CARE_PROVIDER_SITE_OTHER): Payer: Self-pay

## 2013-12-18 ENCOUNTER — Ambulatory Visit (INDEPENDENT_AMBULATORY_CARE_PROVIDER_SITE_OTHER): Payer: Medicare Other | Admitting: General Surgery

## 2013-12-18 ENCOUNTER — Encounter (INDEPENDENT_AMBULATORY_CARE_PROVIDER_SITE_OTHER): Payer: Medicare Other | Admitting: General Surgery

## 2013-12-18 VITALS — BP 132/86 | HR 78 | Temp 98.7°F | Resp 16 | Ht 72.0 in | Wt 131.8 lb

## 2013-12-18 DIAGNOSIS — C50411 Malignant neoplasm of upper-outer quadrant of right female breast: Secondary | ICD-10-CM

## 2013-12-18 DIAGNOSIS — C50419 Malignant neoplasm of upper-outer quadrant of unspecified female breast: Secondary | ICD-10-CM

## 2013-12-18 DIAGNOSIS — D0590 Unspecified type of carcinoma in situ of unspecified breast: Secondary | ICD-10-CM

## 2013-12-18 MED ORDER — HYDROCODONE-ACETAMINOPHEN 5-325 MG PO TABS: 1.0000 | ORAL_TABLET | Freq: Four times a day (QID) | ORAL | Status: DC | PRN

## 2013-12-18 MED ORDER — OXYCODONE-ACETAMINOPHEN 5-325 MG PO TABS: 1.0000 | ORAL_TABLET | Freq: Four times a day (QID) | ORAL | Status: DC | PRN

## 2013-12-18 NOTE — Progress Notes (Signed)
Patient ID: Melissa Oconnell, female   DOB: 08-06-1955, 58 y.o.   MRN: [id number] History: This patient underwent right total mastectomy and right axillary sentinel node biopsy on 12/02/2013. 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 Negative margins. 2 sentinel lymph nodes are negative. ER-positive 100%. PR negative. HER-2/neu negative. Final stage T2, N0.Pathology report was discussed with the patient and her caregiver. She is doing well. Some discomfort. Request refill hydrocodone. Drainage is down to 5 cc per day in each of the 2 drains.  Exam: Patient is alert. No distress. Her caregiver is with her.Somewhat febrile chronically. Right mastectomy skin flaps looked good. Flaps are stuck well to the chest wall. No hematoma or seroma. Both drains are removed. Redressed wounds. Right shoulder range of motion reveals about 110 abduction. Need stretching.  Assessment: Invasive carcinoma right breast, 2.5 cm, receptor positive, HER-2-negative. Stage TII, N0. Recovering uneventfully and there are present. Following right total mastectomy and sentinel node biopsy Severe multiple sclerosis Gait disorder Memory loss Family history breast cancer in mother, postmenopausal  Plan: Patient will be referred to physical therapy for right shoulder range of motion exercises. Referred to medical oncology for assessment regarding efficacy of antiestrogen therapy Refill hydrocodone 30 tablets Her return to see me in 3 weeks.    Edsel Petrin. Dalbert Batman, M.D., [facility] General and Minimally invasive Surgery Breast and Colorectal Surgery Office:   [phone] Pager:   [phone]

## 2013-12-18 NOTE — Patient Instructions (Signed)
Your right mastectomy wound is healing nicely. We removed the drains today. You may take a shower starting tomorrow.  You will be referred to physical therapy  It is okay to resume your "Adult Care" activities  You have been given a refill on your hydrocodone prescription  You will be referred to medical oncology to discuss other forms of treatment for your right breast cancer.  Return to see Dr. Dalbert Batman in 3 weeks.

## 2013-12-22 ENCOUNTER — Telehealth: Payer: Self-pay | Admitting: *Deleted

## 2013-12-22 NOTE — Telephone Encounter (Signed)
Received referral from [facility] and sent a request to Dr. Alvy Bimler to see if she is willing/able to see this pt.  I emailed Glenda at [facility] to make her aware.

## 2013-12-23 ENCOUNTER — Telehealth: Payer: Self-pay | Admitting: Hematology and Oncology

## 2013-12-23 ENCOUNTER — Telehealth: Payer: Self-pay | Admitting: *Deleted

## 2013-12-23 NOTE — Telephone Encounter (Signed)
Called pt and confirmed 01/06/14 appt w/ her.  Mailed before appt letter, welcoming packet and intake form to pt.  Emailed Glenda at [facility] to make her aware.  Emailed Dr. Alvy Bimler to make her aware.  Took paperwork to [employer] in Med Rec for chart.

## 2013-12-23 NOTE — Telephone Encounter (Signed)
LEFT MESSAGE FOR PATIENT TO RETURN CALL TO SCHEDULE APPT.  °

## 2013-12-25 ENCOUNTER — Ambulatory Visit: Payer: Medicare Other | Attending: General Surgery | Admitting: Physical Therapy

## 2013-12-25 DIAGNOSIS — IMO0001 Reserved for inherently not codable concepts without codable children: Secondary | ICD-10-CM | POA: Diagnosis not present

## 2013-12-25 DIAGNOSIS — C50919 Malignant neoplasm of unspecified site of unspecified female breast: Secondary | ICD-10-CM | POA: Diagnosis not present

## 2013-12-25 DIAGNOSIS — M24519 Contracture, unspecified shoulder: Secondary | ICD-10-CM | POA: Diagnosis not present

## 2013-12-25 DIAGNOSIS — M25519 Pain in unspecified shoulder: Secondary | ICD-10-CM | POA: Diagnosis not present

## 2014-01-06 ENCOUNTER — Ambulatory Visit (HOSPITAL_BASED_OUTPATIENT_CLINIC_OR_DEPARTMENT_OTHER): Payer: Medicare Other | Admitting: Hematology and Oncology

## 2014-01-06 ENCOUNTER — Encounter: Payer: Self-pay | Admitting: Hematology and Oncology

## 2014-01-06 ENCOUNTER — Telehealth: Payer: Self-pay | Admitting: Hematology and Oncology

## 2014-01-06 ENCOUNTER — Ambulatory Visit: Payer: Medicare Other

## 2014-01-06 VITALS — BP 100/65 | HR 64 | Temp 98.7°F | Resp 18 | Ht 72.0 in | Wt 127.6 lb

## 2014-01-06 DIAGNOSIS — C50419 Malignant neoplasm of upper-outer quadrant of unspecified female breast: Secondary | ICD-10-CM

## 2014-01-06 DIAGNOSIS — C50411 Malignant neoplasm of upper-outer quadrant of right female breast: Secondary | ICD-10-CM

## 2014-01-06 DIAGNOSIS — Z803 Family history of malignant neoplasm of breast: Secondary | ICD-10-CM

## 2014-01-06 DIAGNOSIS — Z170 Estrogen receptor positive status [ER+]: Secondary | ICD-10-CM

## 2014-01-06 DIAGNOSIS — M810 Age-related osteoporosis without current pathological fracture: Secondary | ICD-10-CM

## 2014-01-06 HISTORY — DX: Age-related osteoporosis without current pathological fracture: M81.0

## 2014-01-06 MED ORDER — ANASTROZOLE 1 MG PO TABS: 1.0000 mg | ORAL_TABLET | Freq: Every day | ORAL | Status: DC

## 2014-01-06 NOTE — Progress Notes (Signed)
[facility] CONSULT NOTE  Patient Care Team: Robyn Haber, MD as PCP - General (Family Medicine)  CHIEF COMPLAINTS/PURPOSE OF CONSULTATION:  Right breast cancer  HISTORY OF PRESENTING ILLNESS:  Melissa Oconnell 58 y.o. female is here because of recent diagnosis of right breast cancer.  The cancer was detected by screening mammogram. The cancer was palpable prior to diagnosis. She is a very poor historian and it is not clear whether she ever had screening mammogram.  I reviewed her records extensively and collaborated the history with the patient and her caregiver. I summarize her records as follows  SUMMARY OF ONCOLOGIC HISTORY: Oncology History   Breast cancer of upper-outer quadrant of right female breast, ER positive, PR and Her2/neu negative   Primary site: Breast   Staging method: AJCC 7th Edition   Clinical: Stage IIA (T2, N0, cM0) signed by Heath Lark, MD on 01/06/2014 12:32 PM   Summary: Stage IIA (T2, N0, cM0)       Breast cancer of upper-outer quadrant of right female breast   10/20/2013 Imaging Mammogram showed there is an obscured lobulated 2 cm mass in the right breast upper outer quadrant   10/28/2013 Procedure Biopsy of the right breast confirmed invasive ductal carcinoma, 100% estrogen receptor positive, progesterone receptor 0% and HER-2/neu negative, with Ki-67 of 60%.   11/08/2013 Imaging Due to dense breasts, MRI was ordered and confirmed abnormalities.   12/02/2013 Surgery She had right mastectomy and sentinel lymph node biopsy with negative margin.   12/02/2013 Pathology Accession: [id number], showed invasive ductal carcinoma grade 2, 2.5 cm in size.    In terms of breast cancer risk profile:  She menarched at early age of 67 and went to menopause at age 84  She had 2 pregnancy, her first child was born at age 57  She did not breast-fed her children.  She never received birth control pills.  She was never exposed to fertility medications or  hormone replacement therapy.  She has family history of Breast cancer, and her mother was diagnosed at age 43.  INTERVAL HISTORY: Currently, she is healing well after surgery apart from mild discomfort. She has no concern for local infection  MEDICAL HISTORY:  Past Medical History  Diagnosis Date  . Multiple sclerosis   . Memory loss   . Abnormality of gait   . Neuromuscular disorder     multiple sclerosis  . Cancer     breast  . Osteoporosis, unspecified 01/06/2014    SURGICAL HISTORY: Past Surgical History  Procedure Laterality Date  . Diagnostic laparoscopy    . Tonsillectomy    . Mastectomy w/ sentinel node biopsy Right 12/02/2013    Procedure: TOTAL MASTECTOMY WITH SENTINEL LYMPH NODE BIOPSY;  Surgeon: Adin Hector, MD;  Location: [facility];  Service: General;  Laterality: Right;    SOCIAL HISTORY: History   Social History  . Marital Status: Legally Separated    Spouse Name: N/A    Number of Children: 2  . Years of Education: N/A   Occupational History  . Not on file.   Social History Main Topics  . Smoking status: Former Smoker -- 1.00 packs/day for 5 years    Types: Cigarettes  . Smokeless tobacco: Never Used  . Alcohol Use: No  . Drug Use: No     Comment: quit "long time ago"  . Sexual Activity: Not on file   Other Topics Concern  . Not on file   Social History Narrative  Patient lives at home with [name]. Patient has 2 children. Patient does not work.     FAMILY HISTORY: Family History  Problem Relation Age of Onset  . Cancer Mother     breast    ALLERGIES:  has No Known Allergies.  MEDICATIONS:  Current Outpatient Prescriptions  Medication Sig Dispense Refill  . acetaminophen (TYLENOL) 325 MG tablet Take 325 mg by mouth every 6 (six) hours as needed for headache.      Marland Kitchen HYDROcodone-acetaminophen (NORCO) 5-325 MG per tablet Take 1 tablet by mouth every 6 (six) hours as needed for moderate pain.  30 tablet  0  . anastrozole  (ARIMIDEX) 1 MG tablet Take 1 tablet (1 mg total) by mouth daily.  30 tablet  6   No current facility-administered medications for this visit.    REVIEW OF SYSTEMS:   Constitutional: Denies fevers, chills or abnormal night sweats Eyes: Denies blurriness of vision, double vision or watery eyes Ears, nose, mouth, throat, and face: Denies mucositis or sore throat Respiratory: Denies cough, dyspnea or wheezes Cardiovascular: Denies palpitation, chest discomfort or lower extremity swelling Gastrointestinal:  Denies nausea, heartburn or change in bowel habits Skin: Denies abnormal skin rashes Lymphatics: Denies new lymphadenopathy or easy bruising Neurological:Denies numbness, tingling or new weaknesses. She have chronic weakness due to multiple sclerosis. Behavioral/Psych: Mood is stable, no new changes  All other systems were reviewed with the patient and are negative.  PHYSICAL EXAMINATION: ECOG PERFORMANCE STATUS: 3 - Symptomatic, >50% confined to bed  Filed Vitals:   01/06/14 1158  BP: 100/65  Pulse: 64  Temp: 98.7 F (37.1 C)  Resp: 18   Filed Weights   01/06/14 1158  Weight: 127 lb 9.6 oz (57.879 kg)    GENERAL:alert, no distress and comfortable. She looks thin, weak and debilitated an older than stated age  SKIN: skin color, texture, turgor are normal, no rashes or significant lesions EYES: normal, conjunctiva are pink and non-injected, sclera clear OROPHARYNX:no exudate, no erythema and lips, buccal mucosa, and tongue normal  NECK: supple, thyroid normal size, non-tender, without nodularity LYMPH:  no palpable lymphadenopathy in the cervical, axillary or inguinal LUNGS: clear to auscultation and percussion with normal breathing effort HEART: regular rate & rhythm and no murmurs and no lower extremity edema ABDOMEN:abdomen soft, non-tender and normal bowel sounds Musculoskeletal:no cyanosis of digits and no clubbing  PSYCH: alert & oriented x 3 with fluent speech NEURO:  no focal motor/sensory deficits. I did not assess her functional status as the patient appears very weak. Well-healed mastectomy scar on the right. On the left, no abnormalities. LABORATORY DATA:  I have reviewed the data as listed Lab Results  Component Value Date   WBC 9.1 12/02/2013   HGB 12.1 12/02/2013   HCT 36.8 12/02/2013   MCV 94.4 12/02/2013   PLT 210 12/02/2013   Lab Results  Component Value Date   NA 139 11/26/2013   K 4.2 11/26/2013   CL 103 11/26/2013   CO2 27 11/26/2013    RADIOGRAPHIC STUDIES: I have personally reviewed the radiological images as listed and agreed with the findings in the report.  ASSESSMENT:  right breast cancer s/p lumpectomy, ER positive  PLAN:  #1 Right breast cancer, ER positive The patient had early stage disease. She is considered cured of disease.  Any form of adjuvant treatment is for prevention of disease recurrence.  Due to her severe comorbidities and weakness, she is not candidate for adjuvant chemotherapy. Utilizing information from adjuvant  online.com, using aromatase inhibitor will give an absolute 4% mortality reduction benefit and 15% absolute reduction in risk of recurrence over the next 10 years. I plan to see her back in approximately 4 weeks for assessment of toxicity. My plan would be to start her on adjuvant aromatase inhibitor today. Risks, benefits and side effects of Erbitux as discussed with her and caregiver and they agree to proceed. #2 Bone health I did not see a DEXA scan done. I will order it. In the meantime I recommend calcium with vitamin D. All questions were answered. The patient knows to call the clinic with any problems, questions or concerns. I spent 55 minutes counseling the patient face to face. The total time spent in the appointment was 60 minutes and more than 50% was on counseling.     [facility], NI, MD 01/06/2014 4:07 PM

## 2014-01-06 NOTE — Telephone Encounter (Signed)
gv adn printed appt sched and avs for pt for July  °

## 2014-01-06 NOTE — Progress Notes (Signed)
Checked in new pt with no financial concerns. °

## 2014-01-07 ENCOUNTER — Telehealth (INDEPENDENT_AMBULATORY_CARE_PROVIDER_SITE_OTHER): Payer: Self-pay

## 2014-01-07 NOTE — Telephone Encounter (Signed)
Pt has been d/c'd from nursing; the wound has completely healed.  She is still being seen by PT.

## 2014-01-13 ENCOUNTER — Ambulatory Visit
Admission: RE | Admit: 2014-01-13 | Discharge: 2014-01-13 | Disposition: A | Discharge status: Home / Self Care | Payer: Medicare Other | Source: Ambulatory Visit | Attending: Hematology and Oncology | Admitting: Hematology and Oncology

## 2014-01-13 DIAGNOSIS — C50411 Malignant neoplasm of upper-outer quadrant of right female breast: Secondary | ICD-10-CM

## 2014-01-13 DIAGNOSIS — M810 Age-related osteoporosis without current pathological fracture: Secondary | ICD-10-CM

## 2014-01-15 ENCOUNTER — Ambulatory Visit (INDEPENDENT_AMBULATORY_CARE_PROVIDER_SITE_OTHER): Payer: Medicare Other | Admitting: General Surgery

## 2014-01-15 ENCOUNTER — Encounter (INDEPENDENT_AMBULATORY_CARE_PROVIDER_SITE_OTHER): Payer: Self-pay | Admitting: General Surgery

## 2014-01-15 VITALS — BP 118/76 | HR 74 | Temp 98.0°F | Ht 72.0 in | Wt 126.0 lb

## 2014-01-15 DIAGNOSIS — C50419 Malignant neoplasm of upper-outer quadrant of unspecified female breast: Secondary | ICD-10-CM

## 2014-01-15 DIAGNOSIS — R413 Other amnesia: Secondary | ICD-10-CM

## 2014-01-15 DIAGNOSIS — C50411 Malignant neoplasm of upper-outer quadrant of right female breast: Secondary | ICD-10-CM

## 2014-01-15 NOTE — Patient Instructions (Signed)
Your right mastectomy wound has healed normally. There is no evidence of cancer. There is no evidence of fluid collection or infection.  Continue to stretch and move your right shoulder around as much as possible. Continue physical therapy.  Continue to take the antiestrogen pill that Dr. Alvy Bimler gave you  Return to see Dr. Dalbert Batman in November  We will plan to get a mammogram of the left breast in April or May of 2016.

## 2014-01-15 NOTE — Progress Notes (Signed)
Patient ID: Melissa Oconnell, female   DOB: 1956/01/05, 58 y.o.   MRN: [id number]  History:  This patient underwent right total mastectomy and right axillary sentinel node biopsy on 12/02/2013. 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 Negative margins. 2 sentinel lymph nodes are negative. ER-positive 100%. PR negative. HER-2/neu negative. Final stage T2, N0. She has seen Dr. Alvy Bimler and has been started on an aromatase inhibitor. She is tolerating that well without any hot flashes. She did go to physical therapy for right shoulder. It helped some. She does not have full range of motion, possibly complicated by her severe multiple sclerosis. She says her mastectomy wound is a little bit sore but doing well   Exam:  Patient is alert. No distress. Her caregiver is with her.Somewhat feeble chronically. Right mastectomy skin flaps looked good. Flaps are stuck well to the chest wall. No hematoma or seroma. Right shoulder range of motion reveals about 150 abduction. Need stretching.   Assessment:  Invasive carcinoma right breast, 2.5 cm, receptor positive, HER-2-negative. Stage TII, N0.  Recovering uneventfully and there are present. Following right total mastectomy and sentinel node biopsy  Severe multiple sclerosis  Gait disorder  Memory loss  Family history breast cancer in mother, postmenopausal   Plan:  Continue physical therapy, if possible.  Agree with  antiestrogen therapy  Return to see me in November of this year Plan left breast mammogram in April or May, 2016.Marland Kitchen     Edsel Petrin. Dalbert Batman, M.D., [facility]  General and Minimally invasive Surgery  Breast and Colorectal Surgery  Office: [phone]  Pager: [phone]

## 2014-01-23 ENCOUNTER — Telehealth (INDEPENDENT_AMBULATORY_CARE_PROVIDER_SITE_OTHER): Payer: Self-pay | Admitting: General Surgery

## 2014-01-23 ENCOUNTER — Encounter (INDEPENDENT_AMBULATORY_CARE_PROVIDER_SITE_OTHER): Payer: Self-pay | Admitting: General Surgery

## 2014-01-23 NOTE — Telephone Encounter (Signed)
LMOM asking pt to return my call.  This is so that we can inform her that Dr. Dalbert Batman completed the letter for this patient to return to her adult daycare and that it was faxed.

## 2014-02-03 ENCOUNTER — Encounter: Payer: Self-pay | Admitting: Hematology and Oncology

## 2014-02-03 ENCOUNTER — Ambulatory Visit (HOSPITAL_BASED_OUTPATIENT_CLINIC_OR_DEPARTMENT_OTHER): Payer: Medicare Other | Admitting: Hematology and Oncology

## 2014-02-03 ENCOUNTER — Telehealth: Payer: Self-pay | Admitting: Hematology and Oncology

## 2014-02-03 VITALS — BP 113/68 | HR 63 | Temp 97.8°F | Resp 18 | Ht 72.0 in | Wt 125.1 lb

## 2014-02-03 DIAGNOSIS — D0590 Unspecified type of carcinoma in situ of unspecified breast: Secondary | ICD-10-CM

## 2014-02-03 DIAGNOSIS — C50419 Malignant neoplasm of upper-outer quadrant of unspecified female breast: Secondary | ICD-10-CM

## 2014-02-03 DIAGNOSIS — M810 Age-related osteoporosis without current pathological fracture: Secondary | ICD-10-CM

## 2014-02-03 DIAGNOSIS — L7682 Other postprocedural complications of skin and subcutaneous tissue: Secondary | ICD-10-CM | POA: Insufficient documentation

## 2014-02-03 DIAGNOSIS — C50411 Malignant neoplasm of upper-outer quadrant of right female breast: Secondary | ICD-10-CM

## 2014-02-03 DIAGNOSIS — R52 Pain, unspecified: Secondary | ICD-10-CM

## 2014-02-03 MED ORDER — ANASTROZOLE 1 MG PO TABS: 1.0000 mg | ORAL_TABLET | Freq: Every day | ORAL | Status: DC

## 2014-02-03 MED ORDER — HYDROCODONE-ACETAMINOPHEN 5-325 MG PO TABS: 1.0000 | ORAL_TABLET | Freq: Four times a day (QID) | ORAL | Status: DC | PRN

## 2014-02-03 NOTE — Assessment & Plan Note (Signed)
I refilled her prescription pain medicine just one more time for the incision no one pain from recent breast cancer surgery.

## 2014-02-03 NOTE — Assessment & Plan Note (Signed)
Repeat bone sent to CT scan his back. I recommend vitamin D supplements. I recommend the use of Denosumab in conjunction with her treatment for breast cancer to reduce her risk of fracture. I would obtain insurance preauthorization.

## 2014-02-03 NOTE — Telephone Encounter (Signed)
gv adn rpinted appt sched and avs for pt for Aug °

## 2014-02-03 NOTE — Progress Notes (Signed)
[facility] OFFICE PROGRESS NOTE  Patient Care Team: Robyn Haber, MD as PCP - General (Family Medicine)  SUMMARY OF ONCOLOGIC HISTORY: Oncology History   Breast cancer of upper-outer quadrant of right female breast, ER positive, PR and Her2/neu negative   Primary site: Breast   Staging method: AJCC 7th Edition   Clinical: Stage IIA (T2, N0, cM0) signed by Heath Lark, MD on 01/06/2014 12:32 PM   Summary: Stage IIA (T2, N0, cM0)       Breast cancer of upper-outer quadrant of right female breast   10/20/2013 Imaging Mammogram showed there is an obscured lobulated 2 cm mass in the right breast upper outer quadrant   10/28/2013 Procedure Biopsy of the right breast confirmed invasive ductal carcinoma, 100% estrogen receptor positive, progesterone receptor 0% and HER-2/neu negative, with Ki-67 of 60%.   11/08/2013 Imaging Due to dense breasts, MRI was ordered and confirmed abnormalities.   12/02/2013 Surgery She had right mastectomy and sentinel lymph node biopsy with negative margin.   12/02/2013 Pathology Accession: [id number], showed invasive ductal carcinoma grade 2, 2.5 cm in size.    INTERVAL HISTORY: Please see below for problem oriented charting. She is seen for review of her Arimidex toxicity. Unfortunately, the patient's caretaker did not picked up the prescription and hence she has not started taking it yet. She complained of intermittent incisional wound pain which is not severe.  REVIEW OF SYSTEMS:    All other systems were reviewed with the patient and are negative.  I have reviewed the past medical history, past surgical history, social history and family history with the patient and they are unchanged from previous note.  ALLERGIES:  has No Known Allergies.  MEDICATIONS:  Current Outpatient Prescriptions  Medication Sig Dispense Refill  . HYDROcodone-acetaminophen (NORCO) 5-325 MG per tablet Take 1 tablet by mouth every 6 (six) hours as needed for  moderate pain.  30 tablet  0  . acetaminophen (TYLENOL) 325 MG tablet Take 325 mg by mouth every 6 (six) hours as needed for headache.      . anastrozole (ARIMIDEX) 1 MG tablet Take 1 tablet (1 mg total) by mouth daily.  30 tablet  6   No current facility-administered medications for this visit.    PHYSICAL EXAMINATION: ECOG PERFORMANCE STATUS: 2 - Symptomatic, <50% confined to bed  Filed Vitals:   02/03/14 1236  BP: 113/68  Pulse: 63  Temp: 97.8 F (36.6 C)  Resp: 18   Filed Weights   02/03/14 1236  Weight: 125 lb 1.6 oz (56.745 kg)    GENERAL:alert, no distress and comfortable. She looks thin and cachectic. She is older than stated age. SKIN: skin color, texture, turgor are normal, no rashes or significant lesions EYES: normal, Conjunctiva are pink and non-injected, sclera clear OROPHARYNX:no exudate, no erythema and lips, buccal mucosa, and tongue normal  Musculoskeletal:no cyanosis of digits and no clubbing  LABORATORY DATA:  I have reviewed the data as listed    Component Value Date/Time   NA 139 11/26/2013 1018   K 4.2 11/26/2013 1018   CL 103 11/26/2013 1018   CO2 27 11/26/2013 1018   GLUCOSE 86 11/26/2013 1018   BUN 15 11/26/2013 1018   CREATININE 0.74 12/02/2013 1634   CALCIUM 9.7 11/26/2013 1018   PROT 7.7 11/26/2013 1018   ALBUMIN 4.1 11/26/2013 1018   AST 16 11/26/2013 1018   ALT 10 11/26/2013 1018   ALKPHOS 78 11/26/2013 1018   BILITOT 0.3 11/26/2013 1018  GFRNONAA >90 12/02/2013 1634   GFRAA >90 12/02/2013 1634    No results found for this basename: SPEP, UPEP,  kappa and lambda light chains    Lab Results  Component Value Date   WBC 9.1 12/02/2013   NEUTROABS 1.0* 11/26/2013   HGB 12.1 12/02/2013   HCT 36.8 12/02/2013   MCV 94.4 12/02/2013   PLT 210 12/02/2013      Chemistry      Component Value Date/Time   NA 139 11/26/2013 1018   K 4.2 11/26/2013 1018   CL 103 11/26/2013 1018   CO2 27 11/26/2013 1018   BUN 15 11/26/2013 1018   CREATININE 0.74 12/02/2013  1634      Component Value Date/Time   CALCIUM 9.7 11/26/2013 1018   ALKPHOS 78 11/26/2013 1018   AST 16 11/26/2013 1018   ALT 10 11/26/2013 1018   BILITOT 0.3 11/26/2013 1018       RADIOGRAPHIC STUDIES: Recent bone density scan show severe osteoporosis I have personally reviewed the radiological images as listed and agreed with the findings in the report.  ASSESSMENT & PLAN:  Breast cancer of upper-outer quadrant of right female breast From a prior visit, I recommend she takes Arimidex. The patient did not start taking it. I reinforced the reason why she needs to take it and I encouraged her to pick up the prescription today. I will see her back in a month for toxicity review.  Osteoporosis, unspecified Repeat bone sent to CT scan his back. I recommend vitamin D supplements. I recommend the use of Denosumab in conjunction with her treatment for breast cancer to reduce her risk of fracture. I would obtain insurance preauthorization.  Incisional pain I refilled her prescription pain medicine just one more time for the incision no one pain from recent breast cancer surgery.   All questions were answered. The patient knows to call the clinic with any problems, questions or concerns. No barriers to learning was detected. I spent 15 minutes counseling the patient face to face. The total time spent in the appointment was 20 minutes and more than 50% was on counseling and review of test results     [facility], [place], MD 02/03/2014 8:31 PM

## 2014-02-03 NOTE — Assessment & Plan Note (Signed)
From a prior visit, I recommend she takes Arimidex. The patient did not start taking it. I reinforced the reason why she needs to take it and I encouraged her to pick up the prescription today. I will see her back in a month for toxicity review.

## 2014-03-05 ENCOUNTER — Other Ambulatory Visit (HOSPITAL_BASED_OUTPATIENT_CLINIC_OR_DEPARTMENT_OTHER): Payer: Medicare Other

## 2014-03-05 ENCOUNTER — Encounter: Payer: Self-pay | Admitting: Hematology and Oncology

## 2014-03-05 ENCOUNTER — Telehealth: Payer: Self-pay | Admitting: Hematology and Oncology

## 2014-03-05 ENCOUNTER — Other Ambulatory Visit: Payer: Self-pay | Admitting: Hematology and Oncology

## 2014-03-05 ENCOUNTER — Ambulatory Visit (HOSPITAL_BASED_OUTPATIENT_CLINIC_OR_DEPARTMENT_OTHER): Payer: Medicare Other | Admitting: Hematology and Oncology

## 2014-03-05 VITALS — BP 114/55 | HR 59 | Temp 99.0°F | Resp 18 | Ht 72.0 in | Wt 124.7 lb

## 2014-03-05 DIAGNOSIS — C50411 Malignant neoplasm of upper-outer quadrant of right female breast: Secondary | ICD-10-CM

## 2014-03-05 DIAGNOSIS — C50419 Malignant neoplasm of upper-outer quadrant of unspecified female breast: Secondary | ICD-10-CM

## 2014-03-05 DIAGNOSIS — Z170 Estrogen receptor positive status [ER+]: Secondary | ICD-10-CM

## 2014-03-05 DIAGNOSIS — R269 Unspecified abnormalities of gait and mobility: Secondary | ICD-10-CM

## 2014-03-05 DIAGNOSIS — M810 Age-related osteoporosis without current pathological fracture: Secondary | ICD-10-CM

## 2014-03-05 LAB — BASIC METABOLIC PANEL (CC13)
Anion Gap: 9 mEq/L (ref 3–11)
BUN: 13.7 mg/dL (ref 7.0–26.0)
CHLORIDE: 104 meq/L (ref 98–109)
CO2: 27 mEq/L (ref 22–29)
Calcium: 9.7 mg/dL (ref 8.4–10.4)
Creatinine: 0.8 mg/dL (ref 0.6–1.1)
Glucose: 85 mg/dl (ref 70–140)
POTASSIUM: 4 meq/L (ref 3.5–5.1)
Sodium: 140 mEq/L (ref 136–145)

## 2014-03-05 LAB — MAGNESIUM (CC13): MAGNESIUM: 2.4 mg/dL (ref 1.5–2.5)

## 2014-03-05 LAB — CALCIUM: CALCIUM: 9.5 mg/dL (ref 8.4–10.5)

## 2014-03-05 LAB — PHOSPHORUS: PHOSPHORUS: 3.8 mg/dL (ref 2.3–4.6)

## 2014-03-05 NOTE — Progress Notes (Signed)
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   11/08/2013 Imaging Due to dense breasts, MRI was ordered and confirmed abnormalities.   12/02/2013 Surgery She had right mastectomy and sentinel lymph node biopsy with negative margin.   12/02/2013 Pathology Results Accession: [id number], showed invasive ductal carcinoma grade 2, 2.5 cm in size.   01/13/2014 Imaging Bone density scan show severe osteoporosis with T. -2.5   02/03/2014 -  Chemotherapy The patient was started on Arimidex.    INTERVAL HISTORY: Please see below for problem oriented charting. She tolerated Arimidex well. She denies any hot flashes, mood swings, fluid retention or bone pain. She denies any recent abnormal breast examination, palpable mass, abnormal breast appearance or nipple changes The only main concern is weakness in her legs and gait instability.  REVIEW OF SYSTEMS:   Constitutional: Denies fevers, chills or abnormal weight loss Eyes: Denies blurriness of vision Ears, nose, mouth, throat, and face: Denies mucositis or sore throat Respiratory: Denies cough, dyspnea or wheezes Cardiovascular: Denies palpitation, chest discomfort or lower extremity  swelling Gastrointestinal:  Denies nausea, heartburn or change in bowel habits Skin: Denies abnormal skin rashes Lymphatics: Denies new lymphadenopathy or easy bruising Behavioral/Psych: Mood is stable, no new changes  All other systems were reviewed with the patient and are negative.  I have reviewed the past medical history, past surgical history, social history and family history with the patient and they are unchanged from previous note.  ALLERGIES:  has No Known Allergies.  MEDICATIONS:  Current Outpatient Prescriptions  Medication Sig Dispense Refill  . acetaminophen (TYLENOL) 325 MG tablet Take 325 mg by mouth every 6 (six) hours as needed for headache.      . anastrozole (ARIMIDEX) 1 MG tablet Take 1 tablet (1 mg total) by mouth daily.  30 tablet  6  . HYDROcodone-acetaminophen (NORCO) 5-325 MG per tablet Take 1 tablet by mouth every 6 (six) hours as needed for moderate pain.  30 tablet  0   No current facility-administered medications for this visit.    PHYSICAL EXAMINATION: ECOG PERFORMANCE STATUS: 3 - Symptomatic, >50% confined to bed  Filed Vitals:   03/05/14 1230  BP: 114/55  Pulse: 59  Temp: 99 F (37.2 C)  Resp: 18   Filed Weights   03/05/14 1230  Weight: 124 lb 11.2 oz (56.564 kg)    GENERAL:alert, no distress and comfortable. She looks thin and cachectic  SKIN: skin color, texture, turgor are normal, no rashes or significant lesions EYES: normal, Conjunctiva are pink and non-injected, sclera clear OROPHARYNX:no exudate, no erythema and lips, buccal mucosa, and tongue normal . She has no teeth  Musculoskeletal:no cyanosis of digits and no clubbing  NEURO: alert & oriented  x 3 with fluent speech LABORATORY DATA:  I have reviewed the data as listed    Component Value Date/Time   NA 139 11/26/2013 1018   K 4.2 11/26/2013 1018   CL 103 11/26/2013 1018   CO2 27 11/26/2013 1018   GLUCOSE 86 11/26/2013 1018   BUN 15 11/26/2013 1018   CREATININE 0.74 12/02/2013  1634   CALCIUM 9.7 11/26/2013 1018   PROT 7.7 11/26/2013 1018   ALBUMIN 4.1 11/26/2013 1018   AST 16 11/26/2013 1018   ALT 10 11/26/2013 1018   ALKPHOS 78 11/26/2013 1018   BILITOT 0.3 11/26/2013 1018   GFRNONAA >90 12/02/2013 1634   GFRAA >90 12/02/2013 1634    No results found for this basename: SPEP, UPEP,  kappa and lambda light chains    Lab Results  Component Value Date   WBC 9.1 12/02/2013   NEUTROABS 1.0* 11/26/2013   HGB 12.1 12/02/2013   HCT 36.8 12/02/2013   MCV 94.4 12/02/2013   PLT 210 12/02/2013      Chemistry      Component Value Date/Time   NA 139 11/26/2013 1018   K 4.2 11/26/2013 1018   CL 103 11/26/2013 1018   CO2 27 11/26/2013 1018   BUN 15 11/26/2013 1018   CREATININE 0.74 12/02/2013 1634      Component Value Date/Time   CALCIUM 9.7 11/26/2013 1018   ALKPHOS 78 11/26/2013 1018   AST 16 11/26/2013 1018   ALT 10 11/26/2013 1018   BILITOT 0.3 11/26/2013 1018     ASSESSMENT & PLAN:  Breast cancer of upper-outer quadrant of right female breast The patient tolerated Arimidex well. I will see her back in 6 months with history and physical examination  Osteoporosis, unspecified She has no teeth. She is taking calcium with vitamin D supplements. I told her the risks, benefits, side effects of using Denosumab for prevention of risk of skeletal events and she agreed to proceed. Her caregiver could not wait for blood test results to be available. She wants to return next month for the injection and I will schedule her next appointment visit with a second injection in March.  Gait Disturbance  The patient has weakness and the caregiver is concerned this could be related to treatment. I recommend she consult with herneurologist to see whether her weakness could be related to progression of multiple sclerosis.   Orders Placed This Encounter  Procedures  . Basic metabolic panel    Standing Status: Standing     Number of Occurrences: 9     Standing Expiration Date:  03/06/2015  . Magnesium    Standing Status: Standing     Number of Occurrences: 9     Standing Expiration Date: 03/06/2015  . Phosphorus    Standing Status: Standing     Number of Occurrences: 9     Standing Expiration Date: 03/06/2015  . Calcium    Standing Status: Standing     Number of Occurrences: 9     Standing Expiration Date: 03/06/2015   All questions were answered. The patient knows to call the clinic with any problems, questions or concerns. No barriers to learning was detected. I spent 25 minutes counseling the patient face to face. The total time spent in the appointment was 30 minutes and more than 50% was on counseling and review of test results     [facility], [place], MD 03/05/2014 12:57 PM

## 2014-03-05 NOTE — Assessment & Plan Note (Signed)
She has no teeth. She is taking calcium with vitamin D supplements. I told her the risks, benefits, side effects of using Denosumab for prevention of risk of skeletal events and she agreed to proceed. Her caregiver could not wait for blood test results to be available. She wants to return next month for the injection and I will schedule her next appointment visit with a second injection in March.

## 2014-03-05 NOTE — Telephone Encounter (Signed)
Pt confirmed labs/ov per 08/20 POF, gave pt AVS...KJ °

## 2014-03-05 NOTE — Assessment & Plan Note (Signed)
The patient tolerated Arimidex well. I will see her back in 6 months with history and physical examination

## 2014-03-05 NOTE — Assessment & Plan Note (Signed)
The patient has weakness and the caregiver is concerned this could be related to treatment. I recommend she consult with herneurologist to see whether her weakness could be related to progression of multiple sclerosis.

## 2014-04-02 ENCOUNTER — Ambulatory Visit (HOSPITAL_BASED_OUTPATIENT_CLINIC_OR_DEPARTMENT_OTHER): Payer: Medicare Other

## 2014-04-02 VITALS — BP 119/69 | HR 56 | Temp 98.5°F

## 2014-04-02 DIAGNOSIS — M810 Age-related osteoporosis without current pathological fracture: Secondary | ICD-10-CM

## 2014-04-02 DIAGNOSIS — C50411 Malignant neoplasm of upper-outer quadrant of right female breast: Secondary | ICD-10-CM

## 2014-04-02 DIAGNOSIS — Z23 Encounter for immunization: Secondary | ICD-10-CM

## 2014-04-02 MED ORDER — INFLUENZA VAC SPLIT QUAD 0.5 ML IM SUSY
0.5000 mL | PREFILLED_SYRINGE | Freq: Once | INTRAMUSCULAR | Status: AC
  Administered 2014-04-02: 0.5 mL via INTRAMUSCULAR
  Filled 2014-04-02: qty 0.5

## 2014-04-02 MED ORDER — DENOSUMAB 60 MG/ML ~~LOC~~ SOLN
60.0000 mg | Freq: Once | SUBCUTANEOUS | Status: AC
  Administered 2014-04-02: 60 mg via SUBCUTANEOUS
  Filled 2014-04-02: qty 1

## 2014-08-18 ENCOUNTER — Ambulatory Visit: Payer: Medicare Other | Admitting: Nurse Practitioner

## 2014-09-08 ENCOUNTER — Other Ambulatory Visit: Payer: Self-pay | Admitting: *Deleted

## 2014-09-08 ENCOUNTER — Other Ambulatory Visit (INDEPENDENT_AMBULATORY_CARE_PROVIDER_SITE_OTHER): Payer: Self-pay | Admitting: General Surgery

## 2014-09-08 DIAGNOSIS — C50411 Malignant neoplasm of upper-outer quadrant of right female breast: Secondary | ICD-10-CM

## 2014-09-08 DIAGNOSIS — M810 Age-related osteoporosis without current pathological fracture: Secondary | ICD-10-CM

## 2014-09-08 DIAGNOSIS — C50911 Malignant neoplasm of unspecified site of right female breast: Secondary | ICD-10-CM

## 2014-09-08 MED ORDER — ANASTROZOLE 1 MG PO TABS: 1.0000 mg | ORAL_TABLET | Freq: Every day | ORAL | Status: DC

## 2014-09-08 NOTE — Addendum Note (Signed)
Addended by: Adin Hector on: 09/08/2014 01:33 PM   Modules accepted: Orders

## 2014-09-09 ENCOUNTER — Other Ambulatory Visit (INDEPENDENT_AMBULATORY_CARE_PROVIDER_SITE_OTHER): Payer: Self-pay | Admitting: *Deleted

## 2014-09-09 DIAGNOSIS — C50911 Malignant neoplasm of unspecified site of right female breast: Secondary | ICD-10-CM

## 2014-09-09 DIAGNOSIS — Z9011 Acquired absence of right breast and nipple: Secondary | ICD-10-CM

## 2014-09-09 DIAGNOSIS — Z1231 Encounter for screening mammogram for malignant neoplasm of breast: Secondary | ICD-10-CM

## 2014-09-21 ENCOUNTER — Telehealth: Payer: Self-pay | Admitting: Hematology and Oncology

## 2014-09-21 NOTE — Telephone Encounter (Signed)
s.w. and r/s MD visit due to MD out of the office....pt ok adn aware °

## 2014-09-30 ENCOUNTER — Telehealth: Payer: Self-pay | Admitting: Hematology and Oncology

## 2014-09-30 ENCOUNTER — Ambulatory Visit (HOSPITAL_BASED_OUTPATIENT_CLINIC_OR_DEPARTMENT_OTHER): Payer: Medicare Other

## 2014-09-30 ENCOUNTER — Ambulatory Visit (HOSPITAL_BASED_OUTPATIENT_CLINIC_OR_DEPARTMENT_OTHER): Payer: Medicare Other | Admitting: Hematology and Oncology

## 2014-09-30 ENCOUNTER — Other Ambulatory Visit (HOSPITAL_BASED_OUTPATIENT_CLINIC_OR_DEPARTMENT_OTHER): Payer: Medicare Other

## 2014-09-30 VITALS — BP 120/69 | HR 63 | Temp 97.6°F | Resp 18 | Ht 72.0 in | Wt 125.0 lb

## 2014-09-30 DIAGNOSIS — C50411 Malignant neoplasm of upper-outer quadrant of right female breast: Secondary | ICD-10-CM

## 2014-09-30 DIAGNOSIS — G35 Multiple sclerosis: Secondary | ICD-10-CM

## 2014-09-30 DIAGNOSIS — Z853 Personal history of malignant neoplasm of breast: Secondary | ICD-10-CM

## 2014-09-30 DIAGNOSIS — M810 Age-related osteoporosis without current pathological fracture: Secondary | ICD-10-CM | POA: Diagnosis present

## 2014-09-30 DIAGNOSIS — Z79810 Long term (current) use of selective estrogen receptor modulators (SERMs): Secondary | ICD-10-CM

## 2014-09-30 LAB — BASIC METABOLIC PANEL (CC13)
Anion Gap: 8 mEq/L (ref 3–11)
BUN: 12 mg/dL (ref 7.0–26.0)
CHLORIDE: 104 meq/L (ref 98–109)
CO2: 29 meq/L (ref 22–29)
CREATININE: 0.8 mg/dL (ref 0.6–1.1)
Calcium: 9.2 mg/dL (ref 8.4–10.4)
EGFR: 89 mL/min/{1.73_m2} — AB (ref >90)
GLUCOSE: 100 mg/dL (ref 70–140)
POTASSIUM: 4.1 meq/L (ref 3.5–5.1)
Sodium: 141 mEq/L (ref 136–145)

## 2014-09-30 LAB — MAGNESIUM (CC13): MAGNESIUM: 2.6 mg/dL — AB (ref 1.5–2.5)

## 2014-09-30 LAB — CALCIUM: Calcium: 9.3 mg/dL (ref 8.4–10.5)

## 2014-09-30 LAB — PHOSPHORUS: Phosphorus: 3.9 mg/dL (ref 2.3–4.6)

## 2014-09-30 MED ORDER — DENOSUMAB 60 MG/ML ~~LOC~~ SOLN
60.0000 mg | Freq: Once | SUBCUTANEOUS | Status: AC
  Administered 2014-09-30: 60 mg via SUBCUTANEOUS
  Filled 2014-09-30: qty 1

## 2014-09-30 NOTE — Telephone Encounter (Signed)
Gave avs & calendar for September °

## 2014-10-01 ENCOUNTER — Other Ambulatory Visit: Payer: Medicare Other

## 2014-10-01 ENCOUNTER — Ambulatory Visit: Payer: Medicare Other | Admitting: Hematology and Oncology

## 2014-10-01 ENCOUNTER — Ambulatory Visit: Payer: Medicare Other

## 2014-10-01 NOTE — Assessment & Plan Note (Addendum)
The patient tolerated Arimidex well. I will see her back in 6 months with history and physical examination along with annual screening mammogram on the contralateral breast.

## 2014-10-01 NOTE — Progress Notes (Signed)
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   11/08/2013 Imaging Due to dense breasts, MRI was ordered and confirmed abnormalities.   12/02/2013 Surgery She had right mastectomy and sentinel lymph node biopsy with negative margin.   12/02/2013 Pathology Results Accession: [id number], showed invasive ductal carcinoma grade 2, 2.5 cm in size.   01/13/2014 Imaging Bone density scan show severe osteoporosis with T. -2.5   02/03/2014 -  Chemotherapy The patient was started on Arimidex.   09/30/2014 -  Chemotherapy She is given Prolia for osteoporosis, q 6 months    INTERVAL HISTORY: Please see below for problem oriented charting. She is seen for further follow-up. She is doing well. Denies recent fall. No new neurological deficit. She denies any recent abnormal breast examination, palpable mass, abnormal breast appearance or nipple changes Denies side effects of Arimidex. Denies hot flashes.  REVIEW OF SYSTEMS:   Constitutional: Denies fevers, chills or abnormal weight loss Eyes: Denies blurriness of vision Ears, nose, mouth, throat, and face: Denies mucositis or sore throat Respiratory: Denies cough, dyspnea or  wheezes Cardiovascular: Denies palpitation, chest discomfort or lower extremity swelling Gastrointestinal:  Denies nausea, heartburn or change in bowel habits Skin: Denies abnormal skin rashes Lymphatics: Denies new lymphadenopathy or easy bruising Neurological:Denies numbness, tingling or new weaknesses Behavioral/Psych: Mood is stable, no new changes  All other systems were reviewed with the patient and are negative.  I have reviewed the past medical history, past surgical history, social history and family history with the patient and they are unchanged from previous note.  ALLERGIES:  has No Known Allergies.  MEDICATIONS:  Current Outpatient Prescriptions  Medication Sig Dispense Refill  . calcium-vitamin D (OSCAL WITH D) 500-200 MG-UNIT per tablet Take 1 tablet by mouth 2 (two) times daily.    Marland Kitchen acetaminophen (TYLENOL) 325 MG tablet Take 325 mg by mouth every 6 (six) hours as needed for headache.    . anastrozole (ARIMIDEX) 1 MG tablet Take 1 tablet (1 mg total) by mouth daily. 30 tablet 6  . HYDROcodone-acetaminophen (NORCO) 5-325 MG per tablet Take 1 tablet by mouth every 6 (six) hours as needed for moderate pain. 30 tablet 0   No current facility-administered medications for this visit.    PHYSICAL EXAMINATION: ECOG PERFORMANCE STATUS: 1 - Symptomatic but completely ambulatory  Filed Vitals:   09/30/14 1136  BP: 120/69  Pulse: 63  Temp: 97.6 F (36.4 C)  Resp: 18   Filed Weights   09/30/14 1136  Weight: 125 lb (56.7 kg)    GENERAL:alert, no distress and comfortable. She looked older than stated age SKIN: skin color, texture, turgor are normal, no rashes or significant lesions EYES: normal, Conjunctiva are pink and non-injected,  sclera clear OROPHARYNX:no exudate, no erythema and lips, buccal mucosa, and tongue normal  NECK: supple, thyroid normal size, non-tender, without nodularity LYMPH:  no palpable lymphadenopathy in the cervical, axillary or inguinal LUNGS:  clear to auscultation and percussion with normal breathing effort HEART: regular rate & rhythm and no murmurs and no lower extremity edema ABDOMEN:abdomen soft, non-tender and normal bowel sounds Musculoskeletal:no cyanosis of digits and no clubbing  NEURO: alert & oriented x 3 with fluent speech She has well-healed mastectomies on the right breast. No abnormality is palpable on the left breast. LABORATORY DATA:  I have reviewed the data as listed    Component Value Date/Time   NA 141 09/30/2014 1107   NA 139 11/26/2013 1018   K 4.1 09/30/2014 1107   K 4.2 11/26/2013 1018   CL 103 11/26/2013 1018   CO2 29 09/30/2014 1107   CO2 27 11/26/2013 1018   GLUCOSE 100 09/30/2014 1107   GLUCOSE 86 11/26/2013 1018   BUN 12.0 09/30/2014 1107   BUN 15 11/26/2013 1018   CREATININE 0.8 09/30/2014 1107   CREATININE 0.74 12/02/2013 1634   CALCIUM 9.3 09/30/2014 1108   CALCIUM 9.2 09/30/2014 1107   PROT 7.7 11/26/2013 1018   ALBUMIN 4.1 11/26/2013 1018   AST 16 11/26/2013 1018   ALT 10 11/26/2013 1018   ALKPHOS 78 11/26/2013 1018   BILITOT 0.3 11/26/2013 1018   GFRNONAA >90 12/02/2013 1634   GFRAA >90 12/02/2013 1634    No results found for: SPEP, UPEP  Lab Results  Component Value Date   WBC 9.1 12/02/2013   NEUTROABS 1.0* 11/26/2013   HGB 12.1 12/02/2013   HCT 36.8 12/02/2013   MCV 94.4 12/02/2013   PLT 210 12/02/2013      Chemistry      Component Value Date/Time   NA 141 09/30/2014 1107   NA 139 11/26/2013 1018   K 4.1 09/30/2014 1107   K 4.2 11/26/2013 1018   CL 103 11/26/2013 1018   CO2 29 09/30/2014 1107   CO2 27 11/26/2013 1018   BUN 12.0 09/30/2014 1107   BUN 15 11/26/2013 1018   CREATININE 0.8 09/30/2014 1107   CREATININE 0.74 12/02/2013 1634      Component Value Date/Time   CALCIUM 9.3 09/30/2014 1108   CALCIUM 9.2 09/30/2014 1107   ALKPHOS 78 11/26/2013 1018   AST 16 11/26/2013 1018   ALT 10 11/26/2013 1018   BILITOT 0.3 11/26/2013 1018      ASSESSMENT  & PLAN:  Breast cancer of upper-outer quadrant of right female breast The patient tolerated Arimidex well. I will see her back in 6 months with history and physical examination along with annual screening mammogram on the contralateral breast.    Multiple sclerosis This is stable. She have caregivers and denies recent progression of neurological deficit.   Osteoporosis She has no teeth. She is taking calcium with vitamin D supplements. I told her the risks, benefits, side effects of using Denosumab for prevention of risk of skeletal events and she agreed to proceed. I plan to give her injection every 6 months and repeat bone density in 2 years.    No orders of the defined types were placed in this encounter.   All questions were answered. The patient knows to call the clinic with any problems, questions or concerns. No barriers to learning was detected. I spent 15 minutes counseling the patient face to face. The total time spent in the appointment was 20 minutes and more  than 50% was on counseling and review of test results     [facility], [place], MD 10/01/2014 11:28 AM

## 2014-10-01 NOTE — Assessment & Plan Note (Signed)
She has no teeth. She is taking calcium with vitamin D supplements. I told her the risks, benefits, side effects of using Denosumab for prevention of risk of skeletal events and she agreed to proceed. I plan to give her injection every 6 months and repeat bone density in 2 years.

## 2014-10-01 NOTE — Assessment & Plan Note (Signed)
This is stable. She have caregivers and denies recent progression of neurological deficit.

## 2014-10-06 ENCOUNTER — Other Ambulatory Visit: Payer: Self-pay | Admitting: General Surgery

## 2014-10-06 DIAGNOSIS — Z853 Personal history of malignant neoplasm of breast: Secondary | ICD-10-CM

## 2014-10-06 DIAGNOSIS — Z9011 Acquired absence of right breast and nipple: Secondary | ICD-10-CM

## 2014-10-06 DIAGNOSIS — Z1231 Encounter for screening mammogram for malignant neoplasm of breast: Secondary | ICD-10-CM

## 2014-10-09 ENCOUNTER — Other Ambulatory Visit: Payer: Self-pay

## 2014-10-09 ENCOUNTER — Other Ambulatory Visit: Payer: Self-pay | Admitting: General Surgery

## 2014-10-09 DIAGNOSIS — Z1231 Encounter for screening mammogram for malignant neoplasm of breast: Secondary | ICD-10-CM

## 2014-10-09 DIAGNOSIS — Z9011 Acquired absence of right breast and nipple: Secondary | ICD-10-CM

## 2014-10-09 DIAGNOSIS — Z853 Personal history of malignant neoplasm of breast: Secondary | ICD-10-CM

## 2014-10-28 ENCOUNTER — Ambulatory Visit
Admission: RE | Admit: 2014-10-28 | Discharge: 2014-10-28 | Disposition: A | Discharge status: Home / Self Care | Payer: Medicare Other | Source: Ambulatory Visit | Attending: General Surgery | Admitting: General Surgery

## 2014-10-28 DIAGNOSIS — Z1231 Encounter for screening mammogram for malignant neoplasm of breast: Secondary | ICD-10-CM

## 2014-10-28 DIAGNOSIS — Z853 Personal history of malignant neoplasm of breast: Secondary | ICD-10-CM

## 2014-10-28 DIAGNOSIS — Z9011 Acquired absence of right breast and nipple: Secondary | ICD-10-CM

## 2014-10-29 ENCOUNTER — Other Ambulatory Visit: Payer: Self-pay | Admitting: General Surgery

## 2014-10-29 DIAGNOSIS — R928 Other abnormal and inconclusive findings on diagnostic imaging of breast: Secondary | ICD-10-CM

## 2014-11-04 ENCOUNTER — Ambulatory Visit
Admission: RE | Admit: 2014-11-04 | Discharge: 2014-11-04 | Disposition: A | Discharge status: Home / Self Care | Payer: Medicare Other | Source: Ambulatory Visit | Attending: General Surgery | Admitting: General Surgery

## 2014-11-04 DIAGNOSIS — R928 Other abnormal and inconclusive findings on diagnostic imaging of breast: Secondary | ICD-10-CM

## 2014-11-23 ENCOUNTER — Telehealth: Payer: Self-pay

## 2014-11-23 NOTE — Telephone Encounter (Signed)
Called Patient  And left her a message to call the office and reschedule her apt with Dr.Yan on 12/11/14. Patient will need to schedule a 30 minute visit. Thanks Hinton Dyer for rescheduling patient's apt.

## 2014-12-07 ENCOUNTER — Encounter: Payer: Self-pay | Admitting: Neurology

## 2014-12-07 ENCOUNTER — Other Ambulatory Visit: Payer: Self-pay | Admitting: Neurology

## 2014-12-07 ENCOUNTER — Ambulatory Visit (INDEPENDENT_AMBULATORY_CARE_PROVIDER_SITE_OTHER): Payer: Medicare Other | Admitting: Neurology

## 2014-12-07 VITALS — BP 90/58 | HR 67 | Ht 72.0 in | Wt 122.0 lb

## 2014-12-07 DIAGNOSIS — G35 Multiple sclerosis: Secondary | ICD-10-CM

## 2014-12-07 DIAGNOSIS — R413 Other amnesia: Secondary | ICD-10-CM

## 2014-12-07 DIAGNOSIS — R269 Unspecified abnormalities of gait and mobility: Secondary | ICD-10-CM

## 2014-12-07 MED ORDER — BACLOFEN 10 MG PO TABS: 10.0000 mg | ORAL_TABLET | Freq: Three times a day (TID) | ORAL | Status: DC

## 2014-12-07 NOTE — Progress Notes (Signed)
Chief Complaint  Patient presents with  . Multiple Sclerosis    She is here with aid, [name].  Feels the weakness in her legs is worse and it has caused two falls.  She was not injured during these falls.  She is still ambulating with her walker.       [facility]  PATIENT: Melissa Oconnell DOB: 1955-12-31   REASON FOR VISIT: Followup for secondary progressive Melissa    HISTORY OF PRESENT ILLNESS:Melissa Oconnell, 59 year old female returns for followup secondary progressive multiple sclerosis.   HISTORY:She has a history of gait disorder which has been going on for several years, she lives with her [place], MRI of the brain in 2013 shows multiple brainstem, cerebellar, subcortical and corpus callosum white matter hyperintensities suspicious for demyelinating disease. Presence of T1 black holes and atrophy indicate chronic disease. Patient has never been on any immune modulating therapy. Her mom and her sister both died of Melissa at early ages. She has received some physical therapy since last seen which was beneficial.   She has had an abnormal visual evoked response. She had greater than 5 oligoclonal bands in her CSF with LP done 06/11/2012.  She denies any falls since last seen. She is not using an assistive device. Her memory is stable  UPDATE Dec 07 2014:   She ambulate with a walker, slow worsening gait difficulty, difficult to bend her right leg, she sleeps well, has good appetite, worsening memory trouble,  REVIEW OF SYSTEMS: Full 14 system review of systems performed and notable only for those listed, all others are neg:  Constipation,   ALLERGIES: No Known Allergies  HOME MEDICATIONS: Outpatient Prescriptions Prior to Visit  Medication Sig Dispense Refill  . acetaminophen (TYLENOL) 325 MG tablet Take 325 mg by mouth every 6 (six) hours as needed for headache.    . anastrozole (ARIMIDEX) 1 MG tablet Take 1 tablet (1 mg total) by mouth daily. 30 tablet  6  . calcium-vitamin D (OSCAL WITH D) 500-200 MG-UNIT per tablet Take 1 tablet by mouth 2 (two) times daily.    Marland Kitchen HYDROcodone-acetaminophen (NORCO) 5-325 MG per tablet Take 1 tablet by mouth every 6 (six) hours as needed for moderate pain. 30 tablet 0   No facility-administered medications prior to visit.    PAST MEDICAL HISTORY: Past Medical History  Diagnosis Date  . Multiple sclerosis   . Memory loss   . Abnormality of gait   . Neuromuscular disorder     multiple sclerosis  . Cancer     breast  . Osteoporosis, unspecified 01/06/2014    PAST SURGICAL HISTORY: Past Surgical History  Procedure Laterality Date  . Diagnostic laparoscopy    . Tonsillectomy    . Mastectomy w/ sentinel node biopsy Right 12/02/2013    Procedure: TOTAL MASTECTOMY WITH SENTINEL LYMPH NODE BIOPSY;  Surgeon: Adin Hector, MD;  Location: [facility];  Service: General;  Laterality: Right;    FAMILY HISTORY: Family History  Problem Relation Age of Onset  . Cancer Mother     breast    SOCIAL HISTORY: History   Social History  . Marital Status: Legally Separated    Spouse Name: N/A  . Number of Children: 2  . Years of Education: N/A   Occupational History  . Not on file.   Social History Main Topics  . Smoking status: Former Smoker -- 1.00 packs/day for 5 years    Types: Cigarettes  . Smokeless tobacco: Never Used  .  Alcohol Use: No  . Drug Use: No     Comment: quit "long time ago"  . Sexual Activity: Not on file   Other Topics Concern  . Not on file   Social History Narrative   Patient lives at home with Melissa Oconnell. Patient has 2 children. Patient does not work.      PHYSICAL EXAM  Filed Vitals:   12/07/14 1142  BP: 90/58  Pulse: 67  Height: 6' (1.829 m)  Weight: 122 lb (55.339 kg)   Body mass index is 16.54 kg/(m^2).  PHYSICAL EXAMNIATION:  Gen: NAD, conversant, well nourised, obese, well groomed                     Cardiovascular: Regular rate rhythm, no  peripheral edema, warm, nontender. Eyes: Conjunctivae clear without exudates or hemorrhage Neck: Supple, no carotid bruise. Pulmonary: Clear to auscultation bilaterally   NEUROLOGICAL EXAM:  MENTAL STATUS: Speech/cognition: She depend on her caregiver to provide history, Mini-Mental Status Examination is 21 out of 30, she is not oriented to time, date, missed 3 out of 3 recalls, has difficulties world backwards, could not copy design   CRANIAL NERVES: CN II: Visual fields are full to confrontation. Pupil equal round reactive to light CN III, IV, VI: extraocular movement are normal. No ptosis. CN V: Facial sensation is intact to pinprick in all 3 divisions bilaterally. Corneal responses are intact.  CN VII: Face is symmetric with normal eye closure and smile. CN VIII: Hearing is normal to rubbing fingers CN IX, X: Palate elevates symmetrically. Phonation is normal. CN XI: Head turning and shoulder shrug are intact CN XII: Tongue is midline with normal movements and no atrophy.  MOTOR: She has moderate bilateral lower extremity spasticity, mild bilateral hip flexion, weakness, no significant weakness at upper extremities   REFLEXES: Reflexes are hyperactive and symmetric, Plantar responses are flexor.  SENSORY: Light touch, pinprick, position sense, and vibration sense are intact in fingers and toes.  COORDINATION: Rapid alternating movements and fine finger movements are intact. There is no dysmetria on finger-to-nose and heel-knee-shin. There are no abnormal or extraneous movements.   GAIT/STANCE: She need to push on her walker to get up from seated position, wide-based, stiff, dragging both legs Romberg is absent.   DIAGNOSTIC DATA (LABS, IMAGING, TESTING) - I reviewed patient records, labs, notes, testing and imaging myself where available.  Lab Results  Component Value Date   WBC 9.1 12/02/2013   HGB 12.1 12/02/2013   HCT 36.8 12/02/2013   MCV 94.4 12/02/2013   PLT  210 12/02/2013      Component Value Date/Time   NA 141 09/30/2014 1107   NA 139 11/26/2013 1018   K 4.1 09/30/2014 1107   K 4.2 11/26/2013 1018   CL 103 11/26/2013 1018   CO2 29 09/30/2014 1107   CO2 27 11/26/2013 1018   GLUCOSE 100 09/30/2014 1107   GLUCOSE 86 11/26/2013 1018   BUN 12.0 09/30/2014 1107   BUN 15 11/26/2013 1018   CREATININE 0.8 09/30/2014 1107   CREATININE 0.74 12/02/2013 1634   CALCIUM 9.3 09/30/2014 1108   CALCIUM 9.2 09/30/2014 1107   PROT 7.7 11/26/2013 1018   ALBUMIN 4.1 11/26/2013 1018   AST 16 11/26/2013 1018   ALT 10 11/26/2013 1018   ALKPHOS 78 11/26/2013 1018   BILITOT 0.3 11/26/2013 1018   GFRNONAA >90 12/02/2013 1634   GFRAA >90 12/02/2013 1634    ASSESSMENT AND PLAN  59 y.o. year  old female  has a past medical history of Secondary progressive Multiple sclerosis; Memory loss; Abnormality of gait.  1, repeat MRI of the brain with without contrast 2. With her worsening memory trouble, likely due to extensive white matter disease, brain atrophy from multiple sclerosis, laboratory evaluations 3. Return to clinic in 1-2 months 4. she has bilateral lower extremity spasticity, will start baclofen 100 mg 3 times a day,   Orders Placed This Encounter  Procedures  . MR Brain W Wo Contrast  . Vitamin B12  . RPR  . TSH    New Prescriptions   BACLOFEN (LIORESAL) 10 MG TABLET    Take 1 tablet (10 mg total) by mouth 3 (three) times daily.    There are no discontinued medications.  Return in about 40 days (around 01/16/2015).  Marcial Pacas, M.D. Ph.D.  [facility] [place],  [address] Phone: [phone] Fax:      [phone]

## 2014-12-08 LAB — TSH: TSH: 1.01 u[IU]/mL (ref 0.450–4.500)

## 2014-12-08 LAB — RPR: RPR Ser Ql: NONREACTIVE

## 2014-12-08 LAB — VITAMIN B12: Vitamin B-12: 740 pg/mL (ref 211–946)

## 2014-12-11 ENCOUNTER — Ambulatory Visit: Payer: Medicare Other | Admitting: Neurology

## 2014-12-29 ENCOUNTER — Encounter (HOSPITAL_COMMUNITY): Payer: Self-pay | Admitting: *Deleted

## 2014-12-29 ENCOUNTER — Inpatient Hospital Stay (HOSPITAL_COMMUNITY)
Admission: EM | Admit: 2014-12-29 | Discharge: 2014-12-31 | DRG: 312 | Disposition: A | Discharge status: Home Health | Payer: Medicare Other | Attending: Internal Medicine | Admitting: Internal Medicine

## 2014-12-29 DIAGNOSIS — R55 Syncope and collapse: Secondary | ICD-10-CM

## 2014-12-29 DIAGNOSIS — R413 Other amnesia: Secondary | ICD-10-CM | POA: Diagnosis present

## 2014-12-29 DIAGNOSIS — E860 Dehydration: Secondary | ICD-10-CM | POA: Diagnosis present

## 2014-12-29 DIAGNOSIS — G35 Multiple sclerosis: Secondary | ICD-10-CM | POA: Diagnosis present

## 2014-12-29 DIAGNOSIS — Z681 Body mass index (BMI) 19 or less, adult: Secondary | ICD-10-CM

## 2014-12-29 DIAGNOSIS — C50419 Malignant neoplasm of upper-outer quadrant of unspecified female breast: Secondary | ICD-10-CM | POA: Diagnosis present

## 2014-12-29 DIAGNOSIS — E46 Unspecified protein-calorie malnutrition: Secondary | ICD-10-CM | POA: Diagnosis present

## 2014-12-29 DIAGNOSIS — Z79811 Long term (current) use of aromatase inhibitors: Secondary | ICD-10-CM

## 2014-12-29 DIAGNOSIS — N390 Urinary tract infection, site not specified: Secondary | ICD-10-CM | POA: Diagnosis present

## 2014-12-29 DIAGNOSIS — Z853 Personal history of malignant neoplasm of breast: Secondary | ICD-10-CM | POA: Diagnosis present

## 2014-12-29 DIAGNOSIS — R269 Unspecified abnormalities of gait and mobility: Secondary | ICD-10-CM

## 2014-12-29 DIAGNOSIS — I951 Orthostatic hypotension: Principal | ICD-10-CM | POA: Diagnosis present

## 2014-12-29 DIAGNOSIS — N3000 Acute cystitis without hematuria: Secondary | ICD-10-CM | POA: Diagnosis not present

## 2014-12-29 DIAGNOSIS — Z87891 Personal history of nicotine dependence: Secondary | ICD-10-CM

## 2014-12-29 DIAGNOSIS — M810 Age-related osteoporosis without current pathological fracture: Secondary | ICD-10-CM | POA: Diagnosis present

## 2014-12-29 DIAGNOSIS — Z803 Family history of malignant neoplasm of breast: Secondary | ICD-10-CM

## 2014-12-29 LAB — URINALYSIS, ROUTINE W REFLEX MICROSCOPIC
BILIRUBIN URINE: NEGATIVE
GLUCOSE, UA: NEGATIVE mg/dL
Hgb urine dipstick: NEGATIVE
Ketones, ur: NEGATIVE mg/dL
NITRITE: NEGATIVE
PH: 7 (ref 5.0–8.0)
Protein, ur: NEGATIVE mg/dL
SPECIFIC GRAVITY, URINE: 1.008 (ref 1.005–1.030)
Urobilinogen, UA: 0.2 mg/dL (ref 0.0–1.0)

## 2014-12-29 LAB — CBC WITH DIFFERENTIAL/PLATELET
BASOS PCT: 0 % (ref 0–1)
Basophils Absolute: 0 10*3/uL (ref 0.0–0.1)
EOS ABS: 0.1 10*3/uL (ref 0.0–0.7)
EOS PCT: 1 % (ref 0–5)
HEMATOCRIT: 37.2 % (ref 36.0–46.0)
Hemoglobin: 12.2 g/dL (ref 12.0–15.0)
Lymphocytes Relative: 27 % (ref 12–46)
Lymphs Abs: 1.3 10*3/uL (ref 0.7–4.0)
MCH: 30.7 pg (ref 26.0–34.0)
MCHC: 32.8 g/dL (ref 30.0–36.0)
MCV: 93.7 fL (ref 78.0–100.0)
MONO ABS: 0.5 10*3/uL (ref 0.1–1.0)
Monocytes Relative: 10 % (ref 3–12)
Neutro Abs: 2.9 10*3/uL (ref 1.7–7.7)
Neutrophils Relative %: 62 % (ref 43–77)
Platelets: 222 10*3/uL (ref 150–400)
RBC: 3.97 MIL/uL (ref 3.87–5.11)
RDW: 13.1 % (ref 11.5–15.5)
WBC: 4.7 10*3/uL (ref 4.0–10.5)

## 2014-12-29 LAB — BASIC METABOLIC PANEL
Anion gap: 7 (ref 5–15)
BUN: 15 mg/dL (ref 6–20)
CO2: 30 mmol/L (ref 22–32)
Calcium: 9.6 mg/dL (ref 8.9–10.3)
Chloride: 103 mmol/L (ref 101–111)
Creatinine, Ser: 0.96 mg/dL (ref 0.44–1.00)
GFR calc Af Amer: >60 mL/min (ref >60)
Glucose, Bld: 105 mg/dL — ABNORMAL HIGH (ref 65–99)
POTASSIUM: 4.1 mmol/L (ref 3.5–5.1)
Sodium: 140 mmol/L (ref 135–145)

## 2014-12-29 LAB — URINE MICROSCOPIC-ADD ON

## 2014-12-29 LAB — CBG MONITORING, ED: GLUCOSE-CAPILLARY: 86 mg/dL (ref 65–99)

## 2014-12-29 LAB — CORTISOL-AM, BLOOD: CORTISOL - AM: 7.4 ug/dL (ref 6.7–22.6)

## 2014-12-29 LAB — I-STAT TROPONIN, ED: Troponin i, poc: 0 ng/mL (ref 0.00–0.08)

## 2014-12-29 LAB — TSH: TSH: 0.784 u[IU]/mL (ref 0.350–4.500)

## 2014-12-29 MED ORDER — POLYETHYLENE GLYCOL 3350 17 G PO PACK
17.0000 g | PACK | Freq: Every day | ORAL | Status: DC
  Administered 2014-12-30 – 2014-12-31 (×2): 17 g via ORAL
  Filled 2014-12-29 (×2): qty 1

## 2014-12-29 MED ORDER — DEXTROSE 5 % IV SOLN
1.0000 g | INTRAVENOUS | Status: DC
  Administered 2014-12-30: 1 g via INTRAVENOUS
  Filled 2014-12-29 (×3): qty 10

## 2014-12-29 MED ORDER — SODIUM CHLORIDE 0.9 % IV SOLN
INTRAVENOUS | Status: DC
  Administered 2014-12-29 – 2014-12-30 (×2): via INTRAVENOUS

## 2014-12-29 MED ORDER — ACETAMINOPHEN 650 MG RE SUPP: 650.0000 mg | Freq: Four times a day (QID) | RECTAL | Status: DC | PRN

## 2014-12-29 MED ORDER — ACETAMINOPHEN 325 MG PO TABS: 325.0000 mg | ORAL_TABLET | Freq: Four times a day (QID) | ORAL | Status: DC | PRN

## 2014-12-29 MED ORDER — SODIUM CHLORIDE 0.9 % IV BOLUS (SEPSIS)
1000.0000 mL | Freq: Once | INTRAVENOUS | Status: AC
  Administered 2014-12-29: 1000 mL via INTRAVENOUS

## 2014-12-29 MED ORDER — ACETAMINOPHEN 325 MG PO TABS: 650.0000 mg | ORAL_TABLET | Freq: Four times a day (QID) | ORAL | Status: DC | PRN

## 2014-12-29 MED ORDER — DEXTROSE 5 % IV SOLN
1.0000 g | Freq: Once | INTRAVENOUS | Status: AC
  Administered 2014-12-29: 1 g via INTRAVENOUS
  Filled 2014-12-29: qty 10

## 2014-12-29 MED ORDER — ONDANSETRON HCL 4 MG PO TABS: 4.0000 mg | ORAL_TABLET | Freq: Four times a day (QID) | ORAL | Status: DC | PRN

## 2014-12-29 MED ORDER — CALCIUM CARBONATE-VITAMIN D 500-200 MG-UNIT PO TABS
1.0000 | ORAL_TABLET | Freq: Two times a day (BID) | ORAL | Status: DC
  Administered 2014-12-29 – 2014-12-31 (×4): 1 via ORAL
  Filled 2014-12-29 (×5): qty 1

## 2014-12-29 MED ORDER — BACLOFEN 10 MG PO TABS
10.0000 mg | ORAL_TABLET | Freq: Three times a day (TID) | ORAL | Status: DC
  Administered 2014-12-29 – 2014-12-31 (×5): 10 mg via ORAL
  Filled 2014-12-29 (×5): qty 1

## 2014-12-29 MED ORDER — ONDANSETRON HCL 4 MG/2ML IJ SOLN: 4.0000 mg | Freq: Four times a day (QID) | INTRAMUSCULAR | Status: DC | PRN

## 2014-12-29 MED ORDER — SODIUM CHLORIDE 0.9 % IJ SOLN
3.0000 mL | Freq: Two times a day (BID) | INTRAMUSCULAR | Status: DC
Start: 2014-12-29 — End: 2014-12-31

## 2014-12-29 MED ORDER — ENOXAPARIN SODIUM 40 MG/0.4ML ~~LOC~~ SOLN
40.0000 mg | SUBCUTANEOUS | Status: DC
  Administered 2014-12-29 – 2014-12-30 (×2): 40 mg via SUBCUTANEOUS
  Filled 2014-12-29 (×2): qty 0.4

## 2014-12-29 MED ORDER — HYDROCODONE-ACETAMINOPHEN 5-325 MG PO TABS
1.0000 | ORAL_TABLET | Freq: Four times a day (QID) | ORAL | Status: DC | PRN
  Administered 2014-12-30: 1 via ORAL
  Filled 2014-12-29: qty 1

## 2014-12-29 MED ORDER — ANASTROZOLE 1 MG PO TABS
1.0000 mg | ORAL_TABLET | Freq: Every day | ORAL | Status: DC
  Administered 2014-12-30 – 2014-12-31 (×2): 1 mg via ORAL
  Filled 2014-12-29 (×2): qty 1

## 2014-12-29 NOTE — ED Notes (Signed)
Pt comes from [place] via [place]. Per [facility] report pt stood up and became light headed and weak. Pt had a near syncopal episode and was helped to a sitting position by staff. Pt vitals were 90/60, HR 80 SITTING: 74/60, HR 96 STANDING. Pt has no c/o pain at this time and pt states light headedness has resolved, but still feels weak. Pt has hx of dementia and MS. Pt guardian/POA en route to hospital. Pt is currently living in a group home.

## 2014-12-29 NOTE — ED Notes (Signed)
Dr. Sullivan at bedside

## 2014-12-29 NOTE — ED Notes (Signed)
CBG is 86. Notified nurse Elmyra Ricks.

## 2014-12-29 NOTE — ED Provider Notes (Signed)
CSN: [id number]     Arrival date & time 12/29/14  1055 History   First MD Initiated Contact with Patient 12/29/14 1058     Chief Complaint  Patient presents with  . Near Syncope     (Consider location/radiation/quality/duration/timing/severity/associated sxs/prior Treatment) HPI Joeleen Wortley Is a 59 year old female with a past medical history of multiple sclerosis, memory loss, gait abnormality, osteoporosis, and history of breast cancer who currently lives at a group home. She arrives via EMS for presyncope. Patient stood up early today and felt very lightheaded. She was helped to a chair by staff. Initial vitals by EMS show positive orthostatic hypotension with a initial blood pressure of 90/60 sitting and a drop to 74/60 with standing. The patient reports feeling like lightheaded today. She states she's been eating and drinking normally. The patient seems reliable and her healthcare POA is here and endorses her history. She denies bloody or black and tarry-colored stools, patient is not on a blood thinning agent. She denies racing or skipping in her heart. Past Medical History  Diagnosis Date  . Multiple sclerosis   . Memory loss   . Abnormality of gait   . Neuromuscular disorder     multiple sclerosis  . Cancer     breast  . Osteoporosis, unspecified 01/06/2014   Past Surgical History  Procedure Laterality Date  . Diagnostic laparoscopy    . Tonsillectomy    . Mastectomy w/ sentinel node biopsy Right 12/02/2013    Procedure: TOTAL MASTECTOMY WITH SENTINEL LYMPH NODE BIOPSY;  Surgeon: Adin Hector, MD;  Location: [facility];  Service: General;  Laterality: Right;   Family History  Problem Relation Age of Onset  . Cancer Mother     breast   History  Substance Use Topics  . Smoking status: Former Smoker -- 1.00 packs/day for 5 years    Types: Cigarettes  . Smokeless tobacco: Never Used  . Alcohol Use: No   OB History    No data available     Review of  Systems Ten systems reviewed and are negative for acute change, except as noted in the HPI.     Allergies  Review of patient's allergies indicates no known allergies.  Home Medications   Prior to Admission medications   Medication Sig Start Date End Date Taking? Authorizing Provider  acetaminophen (TYLENOL) 325 MG tablet Take 325 mg by mouth every 6 (six) hours as needed for headache.    Historical Provider, MD  anastrozole (ARIMIDEX) 1 MG tablet Take 1 tablet (1 mg total) by mouth daily. 09/08/14   Heath Lark, MD  baclofen (LIORESAL) 10 MG tablet Take 1 tablet (10 mg total) by mouth 3 (three) times daily. 12/07/14   Marcial Pacas, MD  calcium-vitamin D (OSCAL WITH D) 500-200 MG-UNIT per tablet Take 1 tablet by mouth 2 (two) times daily.    Historical Provider, MD  HYDROcodone-acetaminophen (NORCO) 5-325 MG per tablet Take 1 tablet by mouth every 6 (six) hours as needed for moderate pain. 02/03/14   Ni Gorsuch, MD   BP 104/63 mmHg  Pulse 63  Resp 20  Ht 6' (1.829 m)  Wt 122 lb (55.339 kg)  BMI 16.54 kg/m2  SpO2 98% Physical Exam  Constitutional: She is oriented to person, place, and time. She appears well-developed and well-nourished. No distress.  Very thin female in no acute distress  HENT:  Head: Normocephalic and atraumatic.  Eyes: Conjunctivae are normal. No scleral icterus.  Neck: Normal range  of motion.  Cardiovascular: Normal rate, regular rhythm, normal heart sounds and intact distal pulses.  Exam reveals no gallop and no friction rub.   No murmur heard. Thready pulses  Pulmonary/Chest: Effort normal and breath sounds normal. No respiratory distress.  Abdominal: Soft. Bowel sounds are normal. She exhibits no distension and no mass. There is no tenderness. There is no guarding.  Neurological: She is alert and oriented to person, place, and time.  Skin: Skin is warm and dry. She is not diaphoretic.  Nursing note and vitals reviewed.   ED Course  Procedures (including  critical care time) Labs Review Labs Reviewed  CBC WITH DIFFERENTIAL/PLATELET  BASIC METABOLIC PANEL  URINALYSIS, ROUTINE W REFLEX MICROSCOPIC (NOT AT [facility])  I-STAT TROPOININ, ED  POCT CBG (FASTING - GLUCOSE)-MANUAL ENTRY    Imaging Review No results found.   EKG Interpretation None      MDM   Final diagnoses:  Orthostatic hypotension  Pre-syncope  UTI (lower urinary tract infection)   11:45 AM BP 105/57 mmHg  Pulse 59  Resp 20  Ht 6' (1.829 m)  Wt 122 lb (55.339 kg)  BMI 16.54 kg/m2  SpO2 99% The patient here for presyncope. Patient orthostatic positive. Here in the emergency department. Initial blood pressure lying is 109/61 with heart rate of 62. Patient was asked to stand, but began shaking and became tremulous and asked to said as she was helped to his going to pass out. Blood pressure taken once sitting was 68/51. Patient was sat back down. Fluids are being given at this time.  Patient given a second bolus. No change in her symptomatic orthostasis. I spoke with Dr. Leonel Ramsay about the patient in questioning the possibility of a dysautonomia secondary to her MS as the cause. Dr. Leonel Ramsay reccomends MRIw/wo contrast of brain, cspine,  tspine if sxs do not resolve.  I have discussed the case with Dr. Conley Canal who will admit the patient. Urine is positive for infection. Culture sent. Treated in ed with Rocephin. Labs are otherwise reassuring.  Margarita Mail, PA-C 12/30/14 [place], MD 01/06/15 [phone]

## 2014-12-29 NOTE — H&P (Signed)
[facility] History and Physical  Melissa Oconnell TKP:[id number] DOB: 1956/03/11 DOA: 12/29/2014  Referring physician: Kenton Kingfisher, PA PCP: Robyn Haber, MD   Chief Complaint: near syncope  HPI: Melissa Oconnell is a 59 y.o. female  With h/o MS presents with near syncopal episode while standing. Per ED report, lives in a group home. Felt dizzy upon standing and helped to a sitting position by staff.  In the field, blood pressure reportedly 90/60, HR 80 SITTING: 74/60, HR 96 STANDING. In ED received 2 liters saline and continued to have difficulty standing due to orthostatic dizziness.  UA consistent with UTI.  Patient endorses dysuria for 2 weeks. Occasional chills for 2 weeks. Vomited a week ago, but appetite good.  Also received a dose of rocephin in ED   Review of Systems:  Complete Systems reviewed. As above, otherwise negative.  Past Medical History  Diagnosis Date  . Multiple sclerosis   . Memory loss   . Abnormality of gait   . Neuromuscular disorder     multiple sclerosis  . Cancer     breast  . Osteoporosis, unspecified 01/06/2014   Past Surgical History  Procedure Laterality Date  . Tonsillectomy    . Mastectomy w/ sentinel node biopsy Right 12/02/2013    Procedure: TOTAL MASTECTOMY WITH SENTINEL LYMPH NODE BIOPSY;  Surgeon: Adin Hector, MD;  Location: [facility];  Service: General;  Laterality: Right;  . Diagnostic laparoscopy      "swallowed something"  . Mastectomy Right    Social History:  reports that she has quit smoking. Her smoking use included Cigarettes. She has a 5 pack-year smoking history. She has never used smokeless tobacco. She reports that she drinks alcohol. She reports that she uses illicit drugs (Cocaine).  No Known Allergies  Family History  Problem Relation Age of Onset  . Cancer Mother     breast    Prior to Admission medications   Medication Sig Start Date End Date Taking? Authorizing Provider  acetaminophen (TYLENOL) 325 MG tablet  Take 325 mg by mouth every 6 (six) hours as needed for headache.    Historical Provider, MD  anastrozole (ARIMIDEX) 1 MG tablet Take 1 tablet (1 mg total) by mouth daily. 09/08/14   Heath Lark, MD  baclofen (LIORESAL) 10 MG tablet Take 1 tablet (10 mg total) by mouth 3 (three) times daily. 12/07/14   Marcial Pacas, MD  calcium-vitamin D (OSCAL WITH D) 500-200 MG-UNIT per tablet Take 1 tablet by mouth 2 (two) times daily.    Historical Provider, MD  HYDROcodone-acetaminophen (NORCO) 5-325 MG per tablet Take 1 tablet by mouth every 6 (six) hours as needed for moderate pain. 02/03/14   Heath Lark, MD  polyethylene glycol (MIRALAX / GLYCOLAX) packet Take 17 g by mouth daily. 12/09/14   Historical Provider, MD   Physical Exam: Filed Vitals:   12/29/14 1745 12/29/14 1800 12/29/14 1830 12/29/14 1857  BP: 142/86 132/77 121/94 118/71  Pulse: 69 58 65 60  Temp:    98.7 F (37.1 C)  TempSrc:    Oral  Resp: 28 19 17 17   Height:      Weight:      SpO2: 100% 100% 96% 100%    Wt Readings from Last 3 Encounters:  12/29/14 55.339 kg (122 lb)  12/07/14 55.339 kg (122 lb)  09/30/14 56.7 kg (125 lb)    BP 118/71 mmHg  Pulse 60  Temp(Src) 98.7 F (37.1 C) (Oral)  Resp 17  Ht 6' (1.829  m)  Wt 55.339 kg (122 lb)  BMI 16.54 kg/m2  SpO2 100%  General Appearance:    Alert, cooperative, no distress, thin, eating a [place] sandwich in no distress  Head:    Normocephalic, without obvious abnormality, atraumatic  Eyes:    PERRL, conjunctiva/corneas clear, EOM's intact,   Nose:   Nares normal, septum midline, mucosa normal, no drainage    or sinus tenderness  Throat:   MMM. Edentulous. Upper dentures in place  Neck:   Supple, symmetrical, trachea midline, no adenopathy;    thyroid:  no enlargement/tenderness/nodules; no carotid   bruit or JVD  Back:     Symmetric, no curvature, ROM normal, no CVA tenderness  Lungs:     Clear to auscultation bilaterally, respirations unlabored  Chest Wall:    No tenderness or  deformity   Heart:    Regular rate and rhythm, S1 and S2 normal, no murmur, rub   or gallop  Abdomen:     Soft, non-tender, bowel sounds active all four quadrants,    no masses, no organomegaly  Genitalia:    deferred  Rectal:    deferred  Extremities:   Extremities normal, atraumatic, no cyanosis or edema  Pulses:   2+ and symmetric all extremities  Skin:   Skin color, texture, turgor normal, no rashes or lesions  Lymph nodes:   Cervical, supraclavicular, and axillary nodes normal  Neurologic:   CNII-XII intact, normal strength, sensation and reflexes    throughout             Psych: normal affect Labs on Admission:  Basic Metabolic Panel:  Recent Labs Lab 12/29/14 1130  NA 140  K 4.1  CL 103  CO2 30  GLUCOSE 105*  BUN 15  CREATININE 0.96  CALCIUM 9.6   Liver Function Tests: No results for input(s): AST, ALT, ALKPHOS, BILITOT, PROT, ALBUMIN in the last 168 hours. No results for input(s): LIPASE, AMYLASE in the last 168 hours. No results for input(s): AMMONIA in the last 168 hours. CBC:  Recent Labs Lab 12/29/14 1130  WBC 4.7  NEUTROABS 2.9  HGB 12.2  HCT 37.2  MCV 93.7  PLT 222   Cardiac Enzymes: No results for input(s): CKTOTAL, CKMB, CKMBINDEX, TROPONINI in the last 168 hours.  BNP (last 3 results) No results for input(s): BNP in the last 8760 hours.  ProBNP (last 3 results) No results for input(s): PROBNP in the last 8760 hours.  CBG:  Recent Labs Lab 12/29/14 1146  GLUCAP 86    Radiological Exams on Admission: No results found.  EKG: tracing reviewed. NSR  Assessment/Plan Principal Problem:   Orthostatic hypotension: likely related to UTI.  Continue IVF. Place ted hose. Observation. Pt eval. Daily orthostatics.  EDP discussed with Dr. Leonel Ramsay in ED. If continues, may be related to [place] and would need MRI head C and T spine. Hold off for now, but consider in the future if continues. Active Problems:   Gait Disturbance: walks with walker  and lives in group home   Multiple sclerosis followed by [facility]   Breast cancer of upper-outer quadrant of right female breast   UTI (urinary tract infection): continue rocephin. cx pending   Code Status: full DVT Prophylaxis: Family Communication: pt is a and o Disposition Plan: obs, tele. Back to group home 1-2 days  Time spent: 60 min  [facility]

## 2014-12-29 NOTE — ED Notes (Signed)
I placed the patient on the bedpan and she was unable to go.

## 2014-12-29 NOTE — ED Notes (Signed)
I gave the patient a happy meal and a ginger-ale.

## 2014-12-30 DIAGNOSIS — N3000 Acute cystitis without hematuria: Secondary | ICD-10-CM

## 2014-12-30 DIAGNOSIS — I951 Orthostatic hypotension: Secondary | ICD-10-CM | POA: Diagnosis not present

## 2014-12-30 DIAGNOSIS — M810 Age-related osteoporosis without current pathological fracture: Secondary | ICD-10-CM | POA: Diagnosis present

## 2014-12-30 DIAGNOSIS — Z681 Body mass index (BMI) 19 or less, adult: Secondary | ICD-10-CM | POA: Diagnosis not present

## 2014-12-30 DIAGNOSIS — E860 Dehydration: Secondary | ICD-10-CM | POA: Diagnosis present

## 2014-12-30 DIAGNOSIS — Z803 Family history of malignant neoplasm of breast: Secondary | ICD-10-CM | POA: Diagnosis not present

## 2014-12-30 DIAGNOSIS — Z87891 Personal history of nicotine dependence: Secondary | ICD-10-CM | POA: Diagnosis not present

## 2014-12-30 DIAGNOSIS — N390 Urinary tract infection, site not specified: Secondary | ICD-10-CM | POA: Diagnosis present

## 2014-12-30 DIAGNOSIS — E46 Unspecified protein-calorie malnutrition: Secondary | ICD-10-CM | POA: Diagnosis present

## 2014-12-30 DIAGNOSIS — G35 Multiple sclerosis: Secondary | ICD-10-CM | POA: Diagnosis not present

## 2014-12-30 DIAGNOSIS — R413 Other amnesia: Secondary | ICD-10-CM | POA: Diagnosis present

## 2014-12-30 DIAGNOSIS — C50419 Malignant neoplasm of upper-outer quadrant of unspecified female breast: Secondary | ICD-10-CM | POA: Diagnosis present

## 2014-12-30 DIAGNOSIS — Z79811 Long term (current) use of aromatase inhibitors: Secondary | ICD-10-CM | POA: Diagnosis not present

## 2014-12-30 DIAGNOSIS — R55 Syncope and collapse: Secondary | ICD-10-CM | POA: Diagnosis present

## 2014-12-30 LAB — URINE CULTURE

## 2014-12-30 LAB — BASIC METABOLIC PANEL
Anion gap: 6 (ref 5–15)
BUN: 10 mg/dL (ref 6–20)
CO2: 24 mmol/L (ref 22–32)
Calcium: 8.7 mg/dL — ABNORMAL LOW (ref 8.9–10.3)
Chloride: 108 mmol/L (ref 101–111)
Creatinine, Ser: 0.74 mg/dL (ref 0.44–1.00)
GFR calc non Af Amer: >60 mL/min (ref >60)
Glucose, Bld: 91 mg/dL (ref 65–99)
Potassium: 3.7 mmol/L (ref 3.5–5.1)
Sodium: 138 mmol/L (ref 135–145)

## 2014-12-30 NOTE — Evaluation (Signed)
Physical Therapy Evaluation Patient Details Name: Melissa Oconnell MRN: [id number] DOB: 04/03/1956 Today's Date: 12/30/2014   History of Present Illness  With h/o MS presents with near syncopal episode while standing. Per ED report, lives in a group home. Felt dizzy upon standing and helped to a sitting position by staff.UA consistent with UTI. Patient endorses dysuria for 2 weeks. Occasional chills for 2 weeks.  Clinical Impression  Pt admitted with above diagnosis. Pt currently with functional limitations due to the deficits listed below (see PT Problem List).  Pt will benefit from skilled PT to increase their independence and safety with mobility to allow discharge to the venue listed below.  Due to symptomatic orthostatic hypotension, deferred gait at eval.  Recommend HHPT at group home at this time due to history of MS and risk for exacerbation.     Follow Up Recommendations Home health PT    Equipment Recommendations  None recommended by PT    Recommendations for Other Services       Precautions / Restrictions Precautions Precautions: Fall      Mobility  Bed Mobility Overal bed mobility: Needs Assistance Bed Mobility: Supine to Sit     Supine to sit: Supervision        Transfers Overall transfer level: Needs assistance Equipment used: Rolling walker (2 wheeled) Transfers: Sit to/from [facility] Sit to Stand: Min assist Stand pivot transfers: Min assist       General transfer comment: Cueing for pushing up from bed and not pulling on RW.  BP taken in standing 87/53 with pt symptomatic. after sitting rest, did SPT with RW and BP one in recliner 124/69.  Ambulation/Gait             General Gait Details: Deferred gait due to dizziness.  Stairs            Wheelchair Mobility    Modified Rankin (Stroke Patients Only)       Balance Overall balance assessment: Needs assistance   Sitting balance-Leahy Scale: Fair     Standing  balance support: Bilateral upper extremity supported Standing balance-Leahy Scale: Poor Standing balance comment: requires RW and pt is dizzy in standing                             Pertinent Vitals/Pain Pain Assessment: 0-10 Pain Score: 9  Pain Location: abd Pain Descriptors / Indicators: Sore Pain Intervention(s): Monitored during session Sitting 109/70 Standing 87/53 Sitting in recliner 124/69    Home Living Family/patient expects to be discharged to:: Group home Living Arrangements: Group Home               Additional Comments: Has RW, shower chair, grab bars in shower    Prior Function Level of Independence: Independent with assistive device(s)         Comments: Amb with RW.     Hand Dominance   Dominant Hand: Right    Extremity/Trunk Assessment   Upper Extremity Assessment: Generalized weakness           Lower Extremity Assessment: Generalized weakness         Communication   Communication: No difficulties  Cognition Arousal/Alertness: Awake/alert Behavior During Therapy: WFL for tasks assessed/performed Overall Cognitive Status: No family/caregiver present to determine baseline cognitive functioning (not oriented to month/season)                      General Comments  Exercises        Assessment/Plan    PT Assessment Patient needs continued PT services  PT Diagnosis Difficulty walking;Generalized weakness   PT Problem List Decreased activity tolerance;Decreased balance;Decreased mobility;Cardiopulmonary status limiting activity;Decreased strength  PT Treatment Interventions Gait training;DME instruction;Functional mobility training;Therapeutic activities;Therapeutic exercise;Patient/family education   PT Goals (Current goals can be found in the Care Plan section) Acute Rehab PT Goals Patient Stated Goal: Feel better PT Goal Formulation: With patient Time For Goal Achievement: 01/06/15 Potential to Achieve  Goals: Good    Frequency Min 3X/week   Barriers to discharge        Co-evaluation               End of Session Equipment Utilized During Treatment: Gait belt Activity Tolerance: Treatment limited secondary to medical complications (Comment) (orthostatic hypotension) Patient left: in chair;with call bell/phone within reach Nurse Communication: Mobility status    Functional Limitation: Changing and maintaining body position Changing and Maintaining Body Position Current Status (E3154): At least 1 percent but less than 20 percent impaired, limited or restricted Changing and Maintaining Body Position Goal Status (M0867): 0 percent impaired, limited or restricted    Time: 6195-0932 PT Time Calculation (min) (ACUTE ONLY): 25 min   Charges:   PT Evaluation $Initial PT Evaluation Tier I: 1 Procedure PT Treatments $Therapeutic Activity: 8-22 mins   PT G Codes:   PT G-Codes **NOT FOR INPATIENT CLASS** Functional Limitation: Changing and maintaining body position Changing and Maintaining Body Position Current Status (I7124): At least 1 percent but less than 20 percent impaired, limited or restricted Changing and Maintaining Body Position Goal Status (P8099): 0 percent impaired, limited or restricted    Tennie Grussing LUBECK 12/30/2014, 12:51 PM

## 2014-12-30 NOTE — Clinical Social Work Note (Signed)
Clinical Social Work Assessment  Patient Details  Name: Melissa Oconnell MRN: [id number] Date of Birth: 1956/05/29  Date of referral:  12/30/14               Reason for consult:  Discharge Planning, Facility Placement                Permission sought to share information with:  Guardian, Other (Caregiver/POA) Permission granted to share information::  Yes, Verbal Permission Granted  Name::     Ila Mcgill  Agency::  n/a  Relationship::  caregiver / POA  Contact Information:  [phone]  Housing/Transportation Living arrangements for the past 2 months:  [place] of Information:  Power of Attorney Patient Interpreter Needed:  None Criminal Activity/Legal Involvement Pertinent to Current Situation/Hospitalization:  No - Comment as needed Significant Relationships:  Other(Comment) (POA) Lives with:  Other (Comment) (POA) Do you feel safe going back to the place where you live?  Yes Need for family participation in patient care:  Yes (Comment) (Patient has POA who is caregiver to patient.)  Care giving concerns:  Patient's POA expressed no concerns at this time.   Social Worker assessment / plan:  CSW consulted regarding patient admitted from [place]. CSW attempted to speak with patient, however, patient not participating in conversation with CSW. CSW contacted patient's POA regarding home life situation. Per POA, patient lives with POA who provides care for patient. Patient's POA anticipates patient to be discharged back home to private residence with POA at time of discharge. CSW updated RN and RNCM regarding patient's home life. CSW signing off.  Employment status:  Disabled (Comment on whether or not currently receiving Disability) Insurance information:  Medicare, Medicaid [place] PT Recommendations:  Not assessed at this time Information / Referral to community resources:  Other (Comment Required) (Patient to return to private residence with  POA.)  Patient/Family's Response to care:  Patient's POA understanding and agreeable to CSW plan of care.  Patient/Family's Understanding of and Emotional Response to Diagnosis, Current Treatment, and Prognosis:  Patient's POA understanding and agreeable to CSW plan of care.  Emotional Assessment Appearance:  Appears stated age Attitude/Demeanor/Rapport:  Other (Patient not participating in assessment with CSW, CSW contacted patient's POA.) Affect (typically observed):  Other (Patient not participating in assessment with CSW, CSW contacted patient's POA.) Orientation:  Oriented to Self, Oriented to Place, Oriented to  Time, Oriented to Situation Alcohol / Substance use:  Not Applicable Psych involvement (Current and /or in the community):  No (Comment) (Not appropriate on this admission.)  Discharge Needs  Concerns to be addressed:  No discharge needs identified Readmission within the last 30 days:  No Current discharge risk:  None Barriers to Discharge:  No Barriers Identified   Caroline Sauger, LCSW 12/30/2014, 12:49 PM [phone]

## 2014-12-30 NOTE — Progress Notes (Addendum)
[facility] PROGRESS NOTE  Melissa Oconnell OBS:[id number] DOB: 1956-01-03 DOA: 12/29/2014 PCP: Robyn Haber, MD   Brief narrative 59 year old female with history of multiple sclerosis, resident of group home presented with a near syncopal episode while standing. She reports feeling dizzy upon standing. She was significantly orthostatic as per EMS with blood pressure of 90/60, heart rate 80 (on sitting) followed by blood pressure of 74/60, heart rate 96 upon standing. In the ED she did received 2 L IV normal saline bolus but continued to be dizzy upon standing due to severe orthostasis. UA was consistent with a UTI. She reports having dysuria for the past 2 weeks with occasional chills and had an episode of vomiting 1 week back. ED physician discussed with the hospitalist who recommended to work her up for MS flare with MRI head cervical and thoracic spine if this persisted. Patient admitted to hospitalist service.   Assessment/Plan: Orthostatic hypotension with near syncope Appears more likely due to dehydration with associated UTI. Ordered TED hose. Continue to monitor with IV fluids. Patient still orthostatic despite IV hydration in the past 24 hrs. She denies further dizziness BP somewhat improved on standing. A.m. cortisol normal. -She has some baseline numbness in her fingers related to her MS which has unchanged. She denies any blurred vision weakness or worsened gait (at baseline uses a walker). She denies any bowel or urinary incontinence. I will monitor her with IV hydration overnight and recheck her orthostasis in the morning. If persistent I will obtain MRI of her brain, cervical and thoracic spine.  UTI Follow urine culture. On empiric Rocephin.  Multiple sclerosis Followed by [facility] neurology. Plan as above. If orthostasis persists will obtain MRI of the brain and spine to rule out MS flare. At baseline uses a walker Seen by PT and recommends home health PT  History  of breast cancer   Diet: Regular DVT prophylaxis: Subcutaneous Lovenox   Code Status: Full code Family Communication: None at bedside Disposition Plan: Return to group home once orthostasis resolved.   Consultants:  None  Procedures:  None  Antibiotics:  IV Rocephin since 6/14  HPI/Subjective: Patient seen and examined. Denies dizziness today. She was still orthostatic although blood pressure has improved on standing. Denies further dysuria.  Objective: Filed Vitals:   12/30/14 0444  BP:   Pulse:   Temp: 98.3 F (36.8 C)  Resp: 18    Intake/Output Summary (Last 24 hours) at 12/30/14 1356 Last data filed at 12/30/14 1314  Gross per 24 hour  Intake   1397 ml  Output   1550 ml  Net   -153 ml   Filed Weights   12/29/14 1103 12/30/14 0444  Weight: 55.339 kg (122 lb) 56.972 kg (125 lb 9.6 oz)    Exam:   General: Middle aged female in no acute distress  HEENT: No pallor, moist oral mucosa  Chest: Clear to auscultation bilaterally on   CVS: Normal S1 and S2, no murmurs rub or gallop   GI: Soft, nondistended, nontender, bowel sounds present  Musculoskeletal: Warm, no edema  CNS: Alert and oriented, nonfocal  Data Reviewed: Basic Metabolic Panel:  Recent Labs Lab 12/29/14 1130 12/30/14 0507  NA 140 138  K 4.1 3.7  CL 103 108  CO2 30 24  GLUCOSE 105* 91  BUN 15 10  CREATININE 0.96 0.74  CALCIUM 9.6 8.7*   Liver Function Tests: No results for input(s): AST, ALT, ALKPHOS, BILITOT, PROT, ALBUMIN in the last 168 hours. No results for  input(s): LIPASE, AMYLASE in the last 168 hours. No results for input(s): AMMONIA in the last 168 hours. CBC:  Recent Labs Lab 12/29/14 1130  WBC 4.7  NEUTROABS 2.9  HGB 12.2  HCT 37.2  MCV 93.7  PLT 222   Cardiac Enzymes: No results for input(s): CKTOTAL, CKMB, CKMBINDEX, TROPONINI in the last 168 hours. BNP (last 3 results) No results for input(s): BNP in the last 8760 hours.  ProBNP (last 3  results) No results for input(s): PROBNP in the last 8760 hours.  CBG:  Recent Labs Lab 12/29/14 1146  GLUCAP 86    No results found for this or any previous visit (from the past 240 hour(s)).   Studies: No results found.  Scheduled Meds: . anastrozole  1 mg Oral Daily  . baclofen  10 mg Oral TID  . calcium-vitamin D  1 tablet Oral BID  . cefTRIAXone (ROCEPHIN)  IV  1 g Intravenous Q24H  . enoxaparin (LOVENOX) injection  40 mg Subcutaneous Q24H  . polyethylene glycol  17 g Oral Daily  . sodium chloride  3 mL Intravenous Q12H   Continuous Infusions: . sodium chloride 100 mL/hr at 12/30/14 0457     Time spent: 25 minutes    Melissa Oconnell, [place]  [facility] Pager [phone] If 7PM-7AM, please contact night-coverage at [email or web address], password [facility] 12/30/2014, 1:56 PM  LOS: 0 days

## 2014-12-31 MED ORDER — CIPROFLOXACIN HCL 500 MG PO TABS: 500.0000 mg | ORAL_TABLET | Freq: Two times a day (BID) | ORAL | Status: AC

## 2014-12-31 NOTE — Discharge Summary (Addendum)
Physician Discharge Summary  Melissa Oconnell GDJ:[id number] DOB: 1956/03/23 DOA: 12/29/2014  PCP: Robyn Haber, MD  Admit date: 12/29/2014 Discharge date: 12/31/2014  Time spent: 25 minutes  Recommendations for Outpatient Follow-up:  1. Discharge home with home health PT 2. Follow-up with PCP in 1 week. Patient will complete total 7 days of antibiotic for UTI on 6/20 3. Please check orthostasis during outpatient follow-up and if persistent despite adequate hydration, please obtain MRI of the brain, cervical and T-spine to rule out for MS flare.  Discharge Diagnoses:  Principal Problem:   Near syncope secondary to Orthostatic hypotension  Active Problems:   UTI (urinary tract infection)   Gait Disturbance    Multiple sclerosis   Breast cancer of upper-outer quadrant of right female breast   Discharge Condition: Fair  Diet recommendation: Regular  Filed Weights   12/29/14 1103 12/30/14 0444  Weight: 55.339 kg (122 lb) 56.972 kg (125 lb 9.6 oz)    History of present illness:  Please refer to admission H&P for details, in brief, 59 year old female with history of multiple sclerosis, resident of group home presented with a near syncopal episode while standing. She reports feeling dizzy upon standing. She was significantly orthostatic as per EMS with blood pressure of 90/60, heart rate 80 (on sitting) followed by blood pressure of 74/60, heart rate 96 upon standing. In the ED she did received 2 L IV normal saline bolus but continued to be dizzy upon standing due to severe orthostasis. UA was consistent with a UTI. She reports having dysuria for the past 2 weeks with occasional chills and had an episode of vomiting 1 week back. ED physician discussed with the hospitalist who recommended to work her up for MS flare with MRI head cervical and thoracic spine if this persisted. Patient admitted to hospitalist service.  Hospital Course:  Orthostatic hypotension with near syncope Appears  more likely due to dehydration with associated UTI than MS flare. -Placed on TED hose.  -Patient treated with aggressive IV hydration. Orthostasis has improved. She still has some systolic orthostasis upon standing (diastolic blood pressure normal).  A.m. cortisol normal. (Blood pressure today: 138/75 lying, HR 54; 119/71 sitting,HR:53, 101/69 standing,HR 76).  -She has some baseline numbness in her fingers related to her MS which has unchanged. She denies any blurred vision weakness or worsened gait (at baseline uses a walker). She denies any bowel or urinary incontinence. -Patient does not have any other dizziness or near syncopal episode. She has been able to ambulate without any difficulty.  UTI -Being treated with IV Rocephin. Urine culture growing mixed bacteria. Her dysuria has now resolved. Patient will be discharged on oral ciprofloxacin for 5 more days to complete a seven-day course of antibiotic.   Multiple sclerosis Followed by [facility] neurology. Plan as above. If orthostasis persists as outpatient despite adequate hydration and treatment for UTI, patient will need MRI of the brain and spine to rule out MS flare. At baseline uses a walker.  Seen by PT and recommends home health PT.  History of breast cancer  Calorie malnutrition Continue supplements     Code Status: Full code Family Communication: None at bedside Disposition Plan: Return to group home    Consultants:  None  Procedures:  None  Antibiotics:  IV Rocephin  6/14-6/16  Oral ciprofloxacin 6/16-6/20   Discharge Exam: Filed Vitals:   12/31/14 0413  BP: 123/70  Pulse: 54  Temp: 98.5 F (36.9 C)  Resp:      General: Middle aged  female in no acute distress  HEENT: No pallor, moist oral mucosa  Chest: Clear to auscultation bilaterally on   CVS: Normal S1 and S2, no murmurs rub or gallop   GI: Soft, nondistended, nontender, bowel sounds present  Musculoskeletal: Warm, no edema  CNS:  Alert and oriented, nonfocal  Discharge Instructions    Current Discharge Medication List    START taking these medications   Details  ciprofloxacin (CIPRO) 500 MG tablet Take 1 tablet (500 mg total) by mouth 2 (two) times daily. Qty: 10 tablet, Refills: 0      CONTINUE these medications which have NOT CHANGED   Details  acetaminophen (TYLENOL) 325 MG tablet Take 325 mg by mouth every 6 (six) hours as needed for mild pain or moderate pain.     anastrozole (ARIMIDEX) 1 MG tablet Take 1 tablet (1 mg total) by mouth daily. Qty: 30 tablet, Refills: 6   Associated Diagnoses: Cancer of upper-outer quadrant of female breast, right; Osteoporosis    baclofen (LIORESAL) 10 MG tablet Take 1 tablet (10 mg total) by mouth 3 (three) times daily. Qty: 90 each, Refills: 11    Calcium Carb-Cholecalciferol (CALCIUM 600 + D) 600-200 MG-UNIT TABS Take 1 tablet by mouth daily.    ENSURE (ENSURE) Take 1 Can by mouth 3 (three) times daily.    HYDROcodone-acetaminophen (NORCO) 5-325 MG per tablet Take 1 tablet by mouth every 6 (six) hours as needed for moderate pain. Qty: 30 tablet, Refills: 0   Associated Diagnoses: Carcinoma in situ of breast, unspecified laterality      STOP taking these medications     calcium-vitamin D (OSCAL WITH D) 500-200 MG-UNIT per tablet      polyethylene glycol (MIRALAX / GLYCOLAX) packet        No Known Allergies Follow-up Information    Follow up with Robyn Haber, MD. Schedule an appointment as soon as possible for a visit in 1 week.   Specialty:  Family Medicine   Contact information:   [facility] [id number], [place] [phone]        The results of significant diagnostics from this hospitalization (including imaging, microbiology, ancillary and laboratory) are listed below for reference.    Significant Diagnostic Studies: No results found.  Microbiology: Recent Results (from the past 240 hour(s))   Urine culture     Status: None   Collection Time: 12/29/14  5:57 PM  Result Value Ref Range Status   Specimen Description URINE, CLEAN CATCH  Final   Special Requests NONE  Final   Culture   Final    Multiple bacterial morphotypes present, none predominant. Suggest appropriate recollection if clinically indicated. Performed at [facility]    Report Status 12/30/2014 FINAL  Final     Labs: Basic Metabolic Panel:  Recent Labs Lab 12/29/14 1130 12/30/14 0507  NA 140 138  K 4.1 3.7  CL 103 108  CO2 30 24  GLUCOSE 105* 91  BUN 15 10  CREATININE 0.96 0.74  CALCIUM 9.6 8.7*   Liver Function Tests: No results for input(s): AST, ALT, ALKPHOS, BILITOT, PROT, ALBUMIN in the last 168 hours. No results for input(s): LIPASE, AMYLASE in the last 168 hours. No results for input(s): AMMONIA in the last 168 hours. CBC:  Recent Labs Lab 12/29/14 1130  WBC 4.7  NEUTROABS 2.9  HGB 12.2  HCT 37.2  MCV 93.7  PLT 222   Cardiac Enzymes: No results for input(s): CKTOTAL,  CKMB, CKMBINDEX, TROPONINI in the last 168 hours. BNP: BNP (last 3 results) No results for input(s): BNP in the last 8760 hours.  ProBNP (last 3 results) No results for input(s): PROBNP in the last 8760 hours.  CBG:  Recent Labs Lab 12/29/14 1146  GLUCAP 86       Signed:  Janai Maudlin  [facility] 12/31/2014, 10:13 AM

## 2014-12-31 NOTE — Discharge Instructions (Addendum)
Orthostatic Hypotension Orthostatic hypotension is a sudden drop in blood pressure. It happens when you quickly stand up from a seated or lying position. You may feel dizzy or light-headed. This can last for just a few seconds or for up to a few minutes. It is usually not a serious problem. However, if this happens frequently or gets worse, it can be a sign of something more serious. CAUSES  Different things can cause orthostatic hypotension, including:   Loss of body fluids (dehydration).  Medicines that lower blood pressure.  Sudden changes in posture, such as standing up quickly after you have been sitting or lying down.  Taking too much of your medicine. SIGNS AND SYMPTOMS   Light-headedness or dizziness.   Fainting or near-fainting.   A fast heart rate.   Weakness.   Feeling tired (fatigue).  DIAGNOSIS  Your health care provider may do several things to help diagnose your condition and identify the cause. These may include:   Taking a medical history and doing a physical exam.  Checking your blood pressure. Your health care provider will check your blood pressure when you are:  Lying down.  Sitting.  Standing.  Using tilt table testing. In this test, you lie down on a table that moves from a lying position to a standing position. You will be strapped onto the table. This test monitors your blood pressure and heart rate when you are in different positions. TREATMENT  Treatment will vary depending on the cause. Possible treatments include:   Changing the dosage of your medicines.  Wearing compression stockings on your lower legs.  Standing up slowly after sitting or lying down.  Eating more salt.  Eating frequent, small meals.  In some cases, getting IV fluids.  Taking medicine to enhance fluid retention. HOME CARE INSTRUCTIONS  Only take over-the-counter or prescription medicines as directed by your health care provider.  Follow your health care  provider's instructions for changing the dosage of your current medicines.  Do not stop or adjust your medicine on your own.  Stand up slowly after sitting or lying down. This allows your body to adjust to the different position.  Wear compression stockings as directed.  Eat extra salt as directed.  Do not add extra salt to your diet unless directed to by your health care provider.  Eat frequent, small meals.  Avoid standing suddenly after eating.  Avoid hot showers or excessive heat as directed by your health care provider.  Keep all follow-up appointments. SEEK MEDICAL CARE IF:  You continue to feel dizzy or light-headed after standing.  You feel groggy or confused.  You feel cold, clammy, or sick to your stomach (nauseous).  You have blurred vision.  You feel short of breath. SEEK IMMEDIATE MEDICAL CARE IF:   You faint after standing.  You have chest pain.  You have difficulty breathing.   You lose feeling or movement in your arms or legs.   You have slurred speech or difficulty talking, or you are unable to talk.  MAKE SURE YOU:   Understand these instructions.  Will watch your condition.  Will get help right away if you are not doing well or get worse. Document Released: 06/23/2002 Document Revised: 07/08/2013 Document Reviewed: 04/25/2013 [facility] Patient Information 2015 [place], [place]. This information is not intended to replace advice given to you by your health care provider. Make sure you discuss any questions you have with your health care provider. Urinary Tract Infection Urinary tract infections (UTIs) can  develop anywhere along your urinary tract. Your urinary tract is your body's drainage system for removing wastes and extra water. Your urinary tract includes two kidneys, two ureters, a bladder, and a urethra. Your kidneys are a pair of bean-shaped organs. Each kidney is about the size of your fist. They are located below your ribs, one on each  side of your spine. CAUSES Infections are caused by microbes, which are microscopic organisms, including fungi, viruses, and bacteria. These organisms are so small that they can only be seen through a microscope. Bacteria are the microbes that most commonly cause UTIs. SYMPTOMS  Symptoms of UTIs may vary by age and gender of the patient and by the location of the infection. Symptoms in young women typically include a frequent and intense urge to urinate and a painful, burning feeling in the bladder or urethra during urination. Older women and men are more likely to be tired, shaky, and weak and have muscle aches and abdominal pain. A fever may mean the infection is in your kidneys. Other symptoms of a kidney infection include pain in your back or sides below the ribs, nausea, and vomiting. DIAGNOSIS To diagnose a UTI, your caregiver will ask you about your symptoms. Your caregiver also will ask to provide a urine sample. The urine sample will be tested for bacteria and white blood cells. White blood cells are made by your body to help fight infection. TREATMENT  Typically, UTIs can be treated with medication. Because most UTIs are caused by a bacterial infection, they usually can be treated with the use of antibiotics. The choice of antibiotic and length of treatment depend on your symptoms and the type of bacteria causing your infection. HOME CARE INSTRUCTIONS  If you were prescribed antibiotics, take them exactly as your caregiver instructs you. Finish the medication even if you feel better after you have only taken some of the medication.  Drink enough water and fluids to keep your urine clear or pale yellow.  Avoid caffeine, tea, and carbonated beverages. They tend to irritate your bladder.  Empty your bladder often. Avoid holding urine for long periods of time.  Empty your bladder before and after sexual intercourse.  After a bowel movement, women should cleanse from front to back. Use each  tissue only once. SEEK MEDICAL CARE IF:   You have back pain.  You develop a fever.  Your symptoms do not begin to resolve within 3 days. SEEK IMMEDIATE MEDICAL CARE IF:   You have severe back pain or lower abdominal pain.  You develop chills.  You have nausea or vomiting.  You have continued burning or discomfort with urination. MAKE SURE YOU:   Understand these instructions.  Will watch your condition.  Will get help right away if you are not doing well or get worse. Document Released: 04/12/2005 Document Revised: 01/02/2012 Document Reviewed: 08/11/2011 [facility] Patient Information 2015 [place], [place]. This information is not intended to replace advice given to you by your health care provider. Make sure you discuss any questions you have with your health care provider.

## 2014-12-31 NOTE — Progress Notes (Signed)
Pt goes to adult day care every day, where she works on her ADLs.

## 2014-12-31 NOTE — Progress Notes (Signed)
Pt discharged with caregiver [name]. All paperwork was discussed, pt and caregiver was educated. VSS, IV removed. Etta Quill, RN

## 2015-01-25 ENCOUNTER — Encounter: Payer: Self-pay | Admitting: Neurology

## 2015-01-25 ENCOUNTER — Ambulatory Visit (INDEPENDENT_AMBULATORY_CARE_PROVIDER_SITE_OTHER): Payer: Medicare Other | Admitting: Neurology

## 2015-01-25 VITALS — BP 110/65 | HR 60 | Ht 72.0 in | Wt 119.0 lb

## 2015-01-25 DIAGNOSIS — G35 Multiple sclerosis: Secondary | ICD-10-CM | POA: Diagnosis not present

## 2015-01-25 DIAGNOSIS — R269 Unspecified abnormalities of gait and mobility: Secondary | ICD-10-CM

## 2015-01-25 DIAGNOSIS — R413 Other amnesia: Secondary | ICD-10-CM

## 2015-01-25 MED ORDER — BACLOFEN 10 MG PO TABS: 10.0000 mg | ORAL_TABLET | Freq: Three times a day (TID) | ORAL | Status: DC

## 2015-01-25 NOTE — Progress Notes (Signed)
Chief Complaint  Patient presents with  . Multiple Sclerosis    MMSE 18/30 - 4 animals. Feels her spasms have improved with baclofen.  Her memory is still a problem.  She has not had her MRI yet due to an oversight with her caregiver.     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   HISTORY:She has a history of gait disorder which has been going on for several years, she lives with [name], MRI of the brain in 2013 shows multiple brainstem, cerebellar, subcortical and corpus callosum white matter hyperintensities suspicious for demyelinating disease. Presence of T1 black holes and atrophy indicate chronic disease. Patient has never been on any immune modulating therapy. Her mom and her sister both died of Melissa at early ages. She has received some physical therapy since last seen which was beneficial.   She has had an abnormal visual evoked response. She had greater than 5 oligoclonal bands in her CSF with LP done 06/11/2012.  She denies any falls since last seen. She is not using an assistive device. Her memory is stable  UPDATE Dec 07 2014:   She ambulate with a walker, slow worsening gait difficulty, difficult to bend her right leg, she sleeps well, has good appetite, worsening memory trouble  UPDATE January 25 2015: She did not have her MRI of brain with without contrast as scheduled, walk with a walker, baclofen 10 mg 3 times a day has somewhat helpful, She also has significant memory trouble rely on her her caregiver  REVIEW OF SYSTEMS: Full 14 system review of systems performed and notable only for those listed, all others are neg:  Constipation,   ALLERGIES: No Known Allergies  HOME MEDICATIONS: Outpatient Prescriptions Prior to Visit  Medication  Sig Dispense Refill  . acetaminophen (TYLENOL) 325 MG tablet Take 325 mg by mouth every 6 (six) hours as needed for mild pain or moderate pain.     Marland Kitchen anastrozole (ARIMIDEX) 1 MG tablet Take 1 tablet (1 mg total) by mouth daily. 30 tablet 6  . baclofen (LIORESAL) 10 MG tablet Take 1 tablet (10 mg total) by mouth 3 (three) times daily. 90 each 11  . Calcium Carb-Cholecalciferol (CALCIUM 600 + D) 600-200 MG-UNIT TABS Take 1 tablet by mouth daily.    Marland Kitchen ENSURE (ENSURE) Take 1 Can by mouth 3 (three) times daily.    Marland Kitchen HYDROcodone-acetaminophen (NORCO) 5-325 MG per tablet Take 1 tablet by mouth every 6 (six) hours as needed for moderate pain. 30 tablet 0   No facility-administered medications prior to visit.    PAST MEDICAL HISTORY: Past Medical History  Diagnosis Date  . Multiple sclerosis   . Memory loss   . Abnormality of gait   . Neuromuscular disorder     multiple sclerosis  . Cancer     breast  . Osteoporosis, unspecified 01/06/2014    PAST SURGICAL HISTORY: Past Surgical History  Procedure Laterality Date  . Tonsillectomy    . Mastectomy w/ sentinel node biopsy Right 12/02/2013    Procedure: TOTAL MASTECTOMY WITH SENTINEL LYMPH NODE BIOPSY;  Surgeon: Adin Hector, MD;  Location: [facility];  Service: General;  Laterality: Right;  . Diagnostic laparoscopy      "swallowed something"  . Mastectomy Right     FAMILY HISTORY: Family History  Problem Relation Age of Onset  . Cancer Mother     breast    SOCIAL HISTORY: History   Social History  . Marital Status: Widowed    Spouse Name: N/A  . Number of Children: 2  . Years of Education: N/A   Occupational History  . Not on file.   Social History Main Topics  . Smoking status: Former Smoker -- 1.00 packs/day for 5 years    Types: Cigarettes  . Smokeless tobacco: Never Used     Comment: "quit smoking cigarettes in 1999"  . Alcohol Use: Yes  . Drug Use: Yes    Special: Cocaine     Comment: 12/29/2014 "quit "2011"  .  Sexual Activity: No   Other Topics Concern  . Not on file   Social History Narrative   Patient lives at home with [name]. Patient has 2 children. Patient does not work.      PHYSICAL EXAM  Filed Vitals:   01/25/15 1042  BP: 110/65  Pulse: 60  Height: 6' (1.829 m)  Weight: 119 lb (53.978 kg)   Body mass index is 16.14 kg/(m^2).  PHYSICAL EXAMNIATION:  Gen: NAD, conversant, well nourised, obese, well groomed                     Cardiovascular: Regular rate rhythm, no peripheral edema, warm, nontender. Eyes: Conjunctivae clear without exudates or hemorrhage Neck: Supple, no carotid bruise. Pulmonary: Clear to auscultation bilaterally   NEUROLOGICAL EXAM:  MENTAL STATUS: Speech/cognition: She depend on her caregiver to provide history,  Mini-Mental Status Examination is 18 out of 30, she is not oriented to time, place, missed 3 out of 3 recalls, could not copy features, has difficulty world backwards    CRANIAL NERVES: CN II: Visual fields are full to confrontation. Pupil equal round reactive to light CN III, IV, VI: extraocular movement are normal. No ptosis. CN V: Facial sensation is intact to pinprick in all 3 divisions bilaterally. Corneal responses are intact.  CN VII: Face is symmetric with normal eye closure and smile. CN VIII: Hearing is normal to rubbing fingers CN IX, X: Palate elevates symmetrically. Phonation is normal. CN XI: Head turning and shoulder shrug are intact CN XII: Tongue is midline with normal movements and no atrophy.  MOTOR: She has mild bilateral lower extremity spasticity, right arm fixation on rapid rotating movement, moderate bilateral lower extremity spasticity, mild to moderate bilateral hip flexion, weakness, right worse than left   REFLEXES: Reflexes are hyperactive and symmetric, Plantar responses are flexor.  SENSORY: Light touch, pinprick, position sense, and vibration sense are intact in fingers and  toes.  COORDINATION: Rapid alternating movements and fine finger movements are intact. There is no dysmetria on finger-to-nose and heel-knee-shin. There are no abnormal or extraneous movements.   GAIT/STANCE: She need to push on her walker to get up from seated position, wide-based, stiff, dragging both legs him a right worse than left Romberg is absent.   DIAGNOSTIC DATA (LABS, IMAGING, TESTING) - I reviewed patient records, labs, notes, testing and imaging myself where available.  Lab Results  Component Value Date   WBC 4.7 12/29/2014   HGB 12.2 12/29/2014   HCT 37.2 12/29/2014   MCV 93.7 12/29/2014   PLT 222 12/29/2014      Component Value Date/Time   NA 138 12/30/2014 0507   NA 141 09/30/2014 1107   K 3.7 12/30/2014 0507   K 4.1 09/30/2014 1107   CL 108 12/30/2014  0507   CO2 24 12/30/2014 0507   CO2 29 09/30/2014 1107   GLUCOSE 91 12/30/2014 0507   GLUCOSE 100 09/30/2014 1107   BUN 10 12/30/2014 0507   BUN 12.0 09/30/2014 1107   CREATININE 0.74 12/30/2014 0507   CREATININE 0.8 09/30/2014 1107   CALCIUM 8.7* 12/30/2014 0507   CALCIUM 9.2 09/30/2014 1107   PROT 7.7 11/26/2013 1018   ALBUMIN 4.1 11/26/2013 1018   AST 16 11/26/2013 1018   ALT 10 11/26/2013 1018   ALKPHOS 78 11/26/2013 1018   BILITOT 0.3 11/26/2013 1018   GFRNONAA >60 12/30/2014 0507   GFRAA >60 12/30/2014 0507    ASSESSMENT AND PLAN  59 y.o. year old female   1, multiple sclerosis, repeat MRI of the brain with and without contrast to see if there is any active lesions to warrant long-term immunomodulation therapy   2, gait difficulty, bilateral lower extremity spasticity, baclofen 10 mg 3 times a day, she is interested in extended release baclofen trial, information is provided  3, dementia, consistent with her diagnosis of significant lesion load from multiple sclerosis,   Marcial Pacas, M.D. Ph.D.  [facility] [place], [place] [address] Phone: [phone] Fax:       [phone]

## 2015-01-26 ENCOUNTER — Telehealth: Payer: Self-pay

## 2015-01-26 NOTE — Telephone Encounter (Signed)
I spoke to the patient's Caregiver to confirm the medications that the patient is taking. Patient is taking: -anastrozole (ARIMIDEX) 1 MG tablet -baclofen (LIORESAL) 10 MG tablet -Calcium Carb-Cholecalciferol (CALCIUM 600 + D) 600-200 MG-UNIT TABS -ENSURE (ENSURE)  Patient is no longer taking: -acetaminophen (TYLENOL) 325 MG tablet -HYDROcodone-acetaminophen (NORCO) 5-325 MG per tablet (last time patient took this medication was "at the end of 2015" as per Caregiver. However, the patient still has some in case she experiences significant pain)

## 2015-01-26 NOTE — Telephone Encounter (Signed)
I left a message for the patient to return my call.

## 2015-02-01 ENCOUNTER — Telehealth: Payer: Self-pay

## 2015-02-01 NOTE — Telephone Encounter (Signed)
I spoke to the patient's caregiver, [name], about the [employer] study. I also reminded her to bring all of the patient's medications to her follow up visit with Dr. Krista Blue on [id number] at 10:45h. Caregiver voiced understanding.

## 2015-02-09 ENCOUNTER — Other Ambulatory Visit: Payer: Medicare Other

## 2015-02-15 ENCOUNTER — Ambulatory Visit
Admission: RE | Admit: 2015-02-15 | Discharge: 2015-02-15 | Disposition: A | Discharge status: Home / Self Care | Payer: Medicare Other | Source: Ambulatory Visit | Attending: Neurology | Admitting: Neurology

## 2015-02-15 DIAGNOSIS — G35 Multiple sclerosis: Secondary | ICD-10-CM

## 2015-02-15 DIAGNOSIS — R269 Unspecified abnormalities of gait and mobility: Secondary | ICD-10-CM

## 2015-02-15 DIAGNOSIS — R413 Other amnesia: Secondary | ICD-10-CM

## 2015-02-15 MED ORDER — GADOBENATE DIMEGLUMINE 529 MG/ML IV SOLN: 10.0000 mL | Freq: Once | INTRAVENOUS | Status: AC | PRN

## 2015-02-16 ENCOUNTER — Telehealth: Payer: Self-pay | Admitting: Neurology

## 2015-02-16 NOTE — Telephone Encounter (Signed)
Left message letting her know that there was definite change on her new scan from her previous scans.

## 2015-02-16 NOTE — Telephone Encounter (Signed)
Melissa Oconnell: Please call patient, MRI of the brain showed evidence of multiple sclerosis, no definite change compared to previous study in 2013, I will go over imaging findings detail at her next follow-up visit  This is an abnormal MRI of the brain with and without contrast showing many T2/FLAIR hyperintense foci in the cerebellum, brainstem, left thalamus and both hemispheres in a pattern and configuration consistent with the diagnosis of multiple sclerosis. None of the foci appears to be acute. There are no enhancing lesions. When compared to an MRI dated 05/20/2012, there is no definite interim change

## 2015-02-25 ENCOUNTER — Encounter: Payer: Self-pay | Admitting: Neurology

## 2015-02-25 ENCOUNTER — Ambulatory Visit (INDEPENDENT_AMBULATORY_CARE_PROVIDER_SITE_OTHER): Payer: Medicare Other | Admitting: Neurology

## 2015-02-25 VITALS — BP 90/58 | HR 68 | Resp 18 | Ht 72.0 in | Wt 112.8 lb

## 2015-02-25 DIAGNOSIS — F0390 Unspecified dementia without behavioral disturbance: Secondary | ICD-10-CM | POA: Diagnosis not present

## 2015-02-25 DIAGNOSIS — G35 Multiple sclerosis: Secondary | ICD-10-CM

## 2015-02-25 DIAGNOSIS — R269 Unspecified abnormalities of gait and mobility: Secondary | ICD-10-CM | POA: Diagnosis not present

## 2015-02-25 MED ORDER — DONEPEZIL HCL 10 MG PO TABS: 10.0000 mg | ORAL_TABLET | Freq: Every day | ORAL | Status: DC

## 2015-02-25 NOTE — Progress Notes (Signed)
Chief Complaint  Patient presents with  . Multiple Sclerosis    Caregiver [name] is with Helene Kelp today.  Sts. pt. is gradually declining--sts. legs seem weaker, gait/balance is worse.  Sts. memory is worse.  Antonela is ambulatory with a walker today.  She simpley says "I feel good."/fim  . Memory Loss    Chief Complaint  Patient presents with  . Multiple Sclerosis    Caregiver [name] is with Helene Kelp today.  Sts. pt. is gradually declining--sts. legs seem weaker, gait/balance is worse.  Sts. memory is worse.  Normagene is ambulatory with a walker today.  She simpley says "I feel good."/fim  . 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   HISTORY:She has a history of gait disorder which has been going on for several years, she lives with her [place], MRI of the brain in 2013 shows multiple brainstem, cerebellar, subcortical and corpus callosum white matter hyperintensities suspicious for demyelinating disease. Presence of T1 black holes and atrophy indicate chronic disease. Patient has never been on any immune modulating therapy. Her mom and her sister both died of Melissa at early ages. She has received some physical therapy since last seen which was beneficial.   She has had an abnormal visual evoked response. She had greater than 5 oligoclonal bands in her CSF with LP done 06/11/2012.  She denies any falls since last seen. She is not using an assistive device. Her memory is stable  UPDATE Dec 07 2014:  She ambulate with a walker, slow worsening gait difficulty, difficult to bend her right leg, she sleeps well, has good appetite, worsening memory trouble  UPDATE January 25 2015: She did not have her MRI of brain with without contrast as scheduled, walk  with a walker, baclofen 10 mg 3 times a day has somewhat helpful, She also has significant memory trouble rely on her her caregiver  UPDATE February 25 2015: We have reviewed MRI brain in August 2016 w/wo:  many T2/FLAIR hyperintense foci in the cerebellum, brainstem, left thalamus and both hemispheres in a pattern and configuration consistent with the diagnosis of multiple sclerosis. None of the foci appears to be acute. There are no enhancing lesions. When compared to an MRI dated 05/20/2012, there is no definite interim change.  Reviewed laboratory evaluation, normal negative CMP, CBC, TSH, RPR, B12  She has more trouble walking and become more confused, fell few times, difficulty to handle daily activities, she can feed herself, sleeps well, she can dress herself, but need help sometimes,   REVIEW OF SYSTEMS: Full 14 system review of systems performed and notable only for those listed, all others are neg: As above  ALLERGIES: No Known Allergies  HOME MEDICATIONS: Outpatient Prescriptions Prior to Visit  Medication Sig Dispense Refill  . anastrozole (ARIMIDEX) 1 MG tablet Take 1 tablet (1 mg total) by mouth daily. 30 tablet 6  . baclofen (LIORESAL) 10 MG tablet Take 1 tablet (10 mg total) by mouth 3 (three) times daily. 270 each 3  . Calcium Carb-Cholecalciferol (CALCIUM 600 + D) 600-200 MG-UNIT TABS Take 1 tablet by mouth daily.    Marland Kitchen ENSURE (ENSURE) Take 1 Can by mouth 3 (three) times daily.    Marland Kitchen acetaminophen (TYLENOL) 325 MG tablet Take 325 mg by mouth every 6 (six)  hours as needed for mild pain or moderate pain.     Marland Kitchen HYDROcodone-acetaminophen (NORCO) 5-325 MG per tablet Take 1 tablet by mouth every 6 (six) hours as needed for moderate pain. (Patient not taking: Reported on 02/25/2015) 30 tablet 0   No facility-administered medications prior to visit.    PAST MEDICAL HISTORY: Past Medical History  Diagnosis Date  . Multiple sclerosis   . Memory loss   . Abnormality of gait   .  Neuromuscular disorder     multiple sclerosis  . Cancer     breast  . Osteoporosis, unspecified 01/06/2014    PAST SURGICAL HISTORY: Past Surgical History  Procedure Laterality Date  . Tonsillectomy    . Mastectomy w/ sentinel node biopsy Right 12/02/2013    Procedure: TOTAL MASTECTOMY WITH SENTINEL LYMPH NODE BIOPSY;  Surgeon: Adin Hector, MD;  Location: [facility];  Service: General;  Laterality: Right;  . Diagnostic laparoscopy      "swallowed something"  . Mastectomy Right     FAMILY HISTORY: Family History  Problem Relation Age of Onset  . Cancer Mother     breast    SOCIAL HISTORY: Social History   Social History  . Marital Status: Widowed    Spouse Name: N/A  . Number of Children: 2  . Years of Education: N/A   Occupational History  . Not on file.   Social History Main Topics  . Smoking status: Former Smoker -- 1.00 packs/day for 5 years    Types: Cigarettes  . Smokeless tobacco: Never Used     Comment: "quit smoking cigarettes in 1999"  . Alcohol Use: Yes  . Drug Use: Yes    Special: Cocaine     Comment: 12/29/2014 "quit "2011"  . Sexual Activity: No   Other Topics Concern  . Not on file   Social History Narrative   Patient lives at home with [name]. Patient has 2 children. Patient does not work.      PHYSICAL EXAM  Filed Vitals:   02/25/15 1109  BP: 90/58  Pulse: 68  Resp: 18  Height: 6' (1.829 m)  Weight: 112 lb 12.8 oz (51.166 kg)   Body mass index is 15.3 kg/(m^2).  PHYSICAL EXAMNIATION:  Gen: NAD, conversant, well nourised, obese, well groomed                     Cardiovascular: Regular rate rhythm, no peripheral edema, warm, nontender. Eyes: Conjunctivae clear without exudates or hemorrhage Neck: Supple, no carotid bruise. Pulmonary: Clear to auscultation bilaterally   NEUROLOGICAL EXAM:  MENTAL STATUS: Speech/cognition: She depend on her caregiver to provide history,  Mini-Mental Status Examination is 17 out of  30, she is not oriented to time, place, missed 3 out of 3 recalls, could not copy the figure, could not write a sentence, has difficulty world backwards    CRANIAL NERVES: CN II: Visual fields are full to confrontation. Pupil equal round reactive to light CN III, IV, VI: extraocular movement are normal. No ptosis. CN V: Facial sensation is intact to pinprick in all 3 divisions bilaterally. Corneal responses are intact.  CN VII: Face is symmetric with normal eye closure and smile. CN VIII: Hearing is normal to rubbing fingers CN IX, X: Palate elevates symmetrically. Phonation is normal. CN XI: Head turning and shoulder shrug are intact CN XII: Tongue is midline with normal movements and no atrophy.  MOTOR: She has mild bilateral lower extremity spasticity, right arm fixation on  rapid rotating movement, moderate bilateral lower extremity spasticity, mild to moderate bilateral hip flexion, weakness, right worse than left   REFLEXES: Reflexes are hyperactive and symmetric, Plantar responses are flexor.  SENSORY: Not reliable  COORDINATION: Rapid alternating movements and fine finger movements are intact. There is no dysmetria on finger-to-nose and heel-knee-shin.   GAIT/STANCE: She need to push on her walker to get up from seated position, wide-based, stiff, dragging both legs, right worse than left Romberg is absent.   DIAGNOSTIC DATA (LABS, IMAGING, TESTING) - I reviewed patient records, labs, notes, testing and imaging myself where available  MRI brain w/wo in August 2016: Many T2/FLAIR hyperintense foci in the cerebellum, brainstem, left thalamus and both hemispheres in a pattern and configuration consistent with the diagnosis of multiple sclerosis. None of the foci appears to be acute. There are no enhancing lesions. When compared to an MRI dated 05/20/2012, there is no definite interim change.  ASSESSMENT AND PLAN  59 y.o. year old female  multiple sclerosis,   Repeat MRI of  the brain showed extensive lesions, also moderate atrophy,  Most consistent with secondary progressive Melissa, will not start any long term immunomodulation therapy  gait difficulty, spasticity  She has no significant improvement with baclofen, potential side effect, we will stop baclofen today  Dementia, Mini-Mental Status Examination 17 out of 30, animal naming for  consistent with her diagnosis of significant lesion load from multiple sclerosis,   Add on Aricept 10 mg daily  Marcial Pacas, M.D. Ph.D.  [facility] [place], [place] [address] Phone: [phone] Fax:      [phone]

## 2015-03-08 ENCOUNTER — Telehealth: Payer: Self-pay | Admitting: Neurology

## 2015-03-08 NOTE — Telephone Encounter (Signed)
Melissa Oconnell with [facility] calling stating they have been supplying the patient with Ensure. Melissa Oconnell POA said Dr Krista Blue suggested another type of Ensure and she is wanting to know what it was so she can fax over script (? [employer] Ensure?). She can be reached at [phone].

## 2015-03-08 NOTE — Telephone Encounter (Signed)
Called Melissa Oconnell and left a detailed message on her secured voicemail with the message below.

## 2015-03-08 NOTE — Telephone Encounter (Signed)
Please let her contact primary care about the choice of ensure, if she knows for sure what kind she wants, we can fax order over, creat 0.74.

## 2015-03-25 ENCOUNTER — Ambulatory Visit: Payer: Medicare Other | Admitting: Hematology and Oncology

## 2015-03-25 ENCOUNTER — Other Ambulatory Visit: Payer: Self-pay | Admitting: Hematology and Oncology

## 2015-03-25 ENCOUNTER — Ambulatory Visit: Payer: Medicare Other

## 2015-03-25 ENCOUNTER — Other Ambulatory Visit: Payer: Medicare Other

## 2015-03-25 ENCOUNTER — Telehealth: Payer: Self-pay | Admitting: Hematology and Oncology

## 2015-03-25 DIAGNOSIS — C50411 Malignant neoplasm of upper-outer quadrant of right female breast: Secondary | ICD-10-CM

## 2015-03-25 NOTE — Telephone Encounter (Signed)
lvm for pt regarding to sEpt appt...Marland KitchenMarland Kitchen

## 2015-04-02 ENCOUNTER — Other Ambulatory Visit: Payer: Self-pay | Admitting: Hematology and Oncology

## 2015-04-05 ENCOUNTER — Telehealth: Payer: Self-pay | Admitting: Hematology and Oncology

## 2015-04-05 ENCOUNTER — Other Ambulatory Visit (HOSPITAL_BASED_OUTPATIENT_CLINIC_OR_DEPARTMENT_OTHER): Payer: Medicare Other

## 2015-04-05 ENCOUNTER — Ambulatory Visit (HOSPITAL_BASED_OUTPATIENT_CLINIC_OR_DEPARTMENT_OTHER): Payer: Medicare Other | Admitting: Hematology and Oncology

## 2015-04-05 ENCOUNTER — Ambulatory Visit (HOSPITAL_BASED_OUTPATIENT_CLINIC_OR_DEPARTMENT_OTHER): Payer: Medicare Other

## 2015-04-05 VITALS — BP 128/65 | HR 60 | Temp 97.8°F | Resp 17 | Ht 72.0 in | Wt 125.2 lb

## 2015-04-05 DIAGNOSIS — M810 Age-related osteoporosis without current pathological fracture: Secondary | ICD-10-CM

## 2015-04-05 DIAGNOSIS — Z23 Encounter for immunization: Secondary | ICD-10-CM

## 2015-04-05 DIAGNOSIS — L7682 Other postprocedural complications of skin and subcutaneous tissue: Secondary | ICD-10-CM

## 2015-04-05 DIAGNOSIS — R208 Other disturbances of skin sensation: Secondary | ICD-10-CM | POA: Diagnosis not present

## 2015-04-05 DIAGNOSIS — C50411 Malignant neoplasm of upper-outer quadrant of right female breast: Secondary | ICD-10-CM | POA: Diagnosis not present

## 2015-04-05 DIAGNOSIS — D0590 Unspecified type of carcinoma in situ of unspecified breast: Secondary | ICD-10-CM

## 2015-04-05 LAB — BASIC METABOLIC PANEL (CC13)
Anion Gap: 7 mEq/L (ref 3–11)
BUN: 16.7 mg/dL (ref 7.0–26.0)
CHLORIDE: 104 meq/L (ref 98–109)
CO2: 29 meq/L (ref 22–29)
Calcium: 9.9 mg/dL (ref 8.4–10.4)
Creatinine: 0.9 mg/dL (ref 0.6–1.1)
EGFR: 77 mL/min/{1.73_m2} — ABNORMAL LOW (ref >90)
GLUCOSE: 108 mg/dL (ref 70–140)
POTASSIUM: 3.8 meq/L (ref 3.5–5.1)
Sodium: 140 mEq/L (ref 136–145)

## 2015-04-05 MED ORDER — ANASTROZOLE 1 MG PO TABS: 1.0000 mg | ORAL_TABLET | Freq: Every day | ORAL | Status: DC

## 2015-04-05 MED ORDER — INFLUENZA VAC SPLIT QUAD 0.5 ML IM SUSY
0.5000 mL | PREFILLED_SYRINGE | Freq: Once | INTRAMUSCULAR | Status: AC
  Administered 2015-04-05: 0.5 mL via INTRAMUSCULAR
  Filled 2015-04-05: qty 0.5

## 2015-04-05 MED ORDER — HYDROCODONE-ACETAMINOPHEN 5-325 MG PO TABS: 1.0000 | ORAL_TABLET | Freq: Four times a day (QID) | ORAL | Status: DC | PRN

## 2015-04-05 MED ORDER — DENOSUMAB 60 MG/ML ~~LOC~~ SOLN
60.0000 mg | Freq: Once | SUBCUTANEOUS | Status: AC
  Administered 2015-04-05: 60 mg via SUBCUTANEOUS
  Filled 2015-04-05: qty 1

## 2015-04-05 NOTE — Telephone Encounter (Signed)
Gave adn printed appt sched and avs for pt for March 2017 °

## 2015-04-05 NOTE — Assessment & Plan Note (Signed)
I refilled her prescription pain medicine just one more time for the incisional pain from prior breast cancer surgery.

## 2015-04-05 NOTE — Assessment & Plan Note (Signed)
She has no teeth. She is taking calcium with vitamin D supplements. I told her the risks, benefits, side effects of using Denosumab for prevention of risk of skeletal events and she agreed to proceed. I plan to give her injection every 6 months and repeat bone density in June 2017

## 2015-04-05 NOTE — Assessment & Plan Note (Signed)
The patient tolerated Arimidex well. I will see her back in 6 months with history and physical examination along with annual screening mammogram on the contralateral breast.  

## 2015-04-05 NOTE — Progress Notes (Signed)
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   11/08/2013 Imaging Due to dense breasts, MRI was ordered and confirmed abnormalities.   12/02/2013 Surgery She had right mastectomy and sentinel lymph node biopsy with negative margin.   12/02/2013 Pathology Results Accession: [id number], showed invasive ductal carcinoma grade 2, 2.5 cm in size.   01/13/2014 Imaging Bone density scan show severe osteoporosis with T. -2.5   02/03/2014 -  Chemotherapy The patient was started on Arimidex.   09/30/2014 -  Chemotherapy She is given Prolia for osteoporosis, q 6 months    INTERVAL HISTORY: Please see below for problem oriented charting. She returns for further follow-up with caregivers. She denies any recent abnormal breast examination, palpable mass, abnormal breast appearance or nipple changes Denies recent falls. No new bone pain.  REVIEW OF SYSTEMS:   Constitutional: Denies fevers, chills or abnormal weight loss Eyes: Denies blurriness of vision Ears, nose, mouth, throat, and face: Denies mucositis or sore throat Respiratory: Denies cough, dyspnea or wheezes Cardiovascular: Denies palpitation, chest discomfort or lower extremity  swelling Gastrointestinal:  Denies nausea, heartburn or change in bowel habits Skin: Denies abnormal skin rashes Lymphatics: Denies new lymphadenopathy or easy bruising Neurological:Denies numbness, tingling or new weaknesses Behavioral/Psych: Mood is stable, no new changes  All other systems were reviewed with the patient and are negative.  I have reviewed the past medical history, past surgical history, social history and family history with the patient and they are unchanged from previous note.  ALLERGIES:  has No Known Allergies.  MEDICATIONS:  Current Outpatient Prescriptions  Medication Sig Dispense Refill  . acetaminophen (TYLENOL) 325 MG tablet Take 325 mg by mouth every 6 (six) hours as needed for mild pain or moderate pain.     Marland Kitchen anastrozole (ARIMIDEX) 1 MG tablet Take 1 tablet (1 mg total) by mouth daily. 30 tablet 6  . Calcium Carb-Cholecalciferol (CALCIUM 600 + D) 600-200 MG-UNIT TABS Take 1 tablet by mouth daily.    Marland Kitchen donepezil (ARICEPT) 10 MG tablet Take 1 tablet (10 mg total) by mouth at bedtime. 30 tablet 6  . ENSURE (ENSURE) Take 1 Can by mouth 3 (three) times daily.    Marland Kitchen HYDROcodone-acetaminophen (NORCO) 5-325 MG per tablet Take 1 tablet by mouth every 6 (six) hours as needed for moderate pain. 90 tablet 0  . polyethylene glycol (MIRALAX / GLYCOLAX) packet      No current facility-administered medications for this visit.    PHYSICAL EXAMINATION: ECOG PERFORMANCE STATUS: 2 - Symptomatic, <50% confined to bed  Filed Vitals:   04/05/15 1320  BP: 128/65  Pulse: 60  Temp: 97.8 F (36.6 C)  Resp: 17   Filed Weights   04/05/15 1320  Weight: 125 lb 3.2 oz (56.79 kg)    GENERAL:alert, no distress and comfortable. She is older than stated age  SKIN: skin color, texture, turgor are normal, no rashes or significant lesions EYES: normal, Conjunctiva are pink and non-injected, sclera clear OROPHARYNX:no exudate, no erythema and lips, buccal mucosa, and tongue normal   NECK: supple, thyroid normal size, non-tender, without nodularity LYMPH:  no palpable lymphadenopathy in the cervical, axillary or inguinal LUNGS: clear to auscultation and percussion with normal breathing effort HEART: regular rate & rhythm and no murmurs and no lower extremity edema ABDOMEN:abdomen soft, non-tender and normal bowel sounds Musculoskeletal:no cyanosis of digits and no clubbing  NEURO: alert & oriented x 3 with fluent speech, no focal motor/sensory deficits Bilateral breast examination were performed. Well-healed mastectomy scar on the right with no other abnormalities. Normal breast exam on the left.  LABORATORY DATA:  I have reviewed the data as listed    Component Value Date/Time   NA 140 04/05/2015 1300   NA 138 12/30/2014 0507   K 3.8 04/05/2015 1300   K 3.7 12/30/2014 0507   CL 108 12/30/2014 0507   CO2 29 04/05/2015 1300   CO2 24 12/30/2014 0507   GLUCOSE 108 04/05/2015 1300   GLUCOSE 91 12/30/2014 0507   BUN 16.7 04/05/2015 1300   BUN 10 12/30/2014 0507   CREATININE 0.9 04/05/2015 1300   CREATININE 0.74 12/30/2014 0507   CALCIUM 9.9 04/05/2015 1300   CALCIUM 8.7* 12/30/2014 0507   PROT 7.7 11/26/2013 1018   ALBUMIN 4.1 11/26/2013 1018   AST 16 11/26/2013 1018   ALT 10 11/26/2013 1018   ALKPHOS 78 11/26/2013 1018   BILITOT 0.3 11/26/2013 1018   GFRNONAA >60 12/30/2014 0507   GFRAA >60 12/30/2014 0507    No results found for: SPEP, UPEP  Lab Results  Component Value Date   WBC 4.7 12/29/2014   NEUTROABS 2.9 12/29/2014   HGB 12.2 12/29/2014   HCT 37.2 12/29/2014   MCV 93.7 12/29/2014   PLT 222 12/29/2014      Chemistry      Component Value Date/Time   NA 140 04/05/2015 1300   NA 138 12/30/2014 0507   K 3.8 04/05/2015 1300   K 3.7 12/30/2014 0507   CL 108 12/30/2014 0507   CO2 29 04/05/2015 1300   CO2 24 12/30/2014 0507   BUN 16.7 04/05/2015 1300   BUN 10 12/30/2014 0507   CREATININE 0.9 04/05/2015 1300   CREATININE 0.74 12/30/2014  0507      Component Value Date/Time   CALCIUM 9.9 04/05/2015 1300   CALCIUM 8.7* 12/30/2014 0507   ALKPHOS 78 11/26/2013 1018   AST 16 11/26/2013 1018   ALT 10 11/26/2013 1018   BILITOT 0.3 11/26/2013 1018     ASSESSMENT & PLAN:  Breast cancer of upper-outer quadrant of right female breast The patient tolerated Arimidex well. I will see her back in 6 months with history and physical examination along with annual screening mammogram on the contralateral breast.   Incisional pain I refilled her prescription pain medicine just one more time for the incisional pain from prior breast cancer surgery.    Osteoporosis She has no teeth. She is taking calcium with vitamin D supplements. I told her the risks, benefits, side effects of using Denosumab for prevention of risk of skeletal events and she agreed to proceed. I plan to give her injection every 6 months and repeat bone density in June 2017     Orders  Placed This Encounter  Procedures  . DG Bone Density    Standing Status: Future     Number of Occurrences:      Standing Expiration Date: 04/04/2016    Order Specific Question:  Reason for Exam (SYMPTOM  OR DIAGNOSIS REQUIRED)    Answer:  osteoporosis    Order Specific Question:  Is the patient pregnant?    Answer:  No    Order Specific Question:  Preferred imaging location?    Answer:  [facility]  . Comprehensive metabolic panel    Standing Status: Future     Number of Occurrences:      Standing Expiration Date: 05/09/2016  . CBC with Differential/Platelet    Standing Status: Future     Number of Occurrences:      Standing Expiration Date: 05/09/2016   All questions were answered. The patient knows to call the clinic with any problems, questions or concerns. No barriers to learning was detected. I spent 15 minutes counseling the patient face to face. The total time spent in the appointment was 20 minutes and more than 50% was on counseling and review of test  results     [facility], [place], MD 04/05/2015 2:02 PM

## 2015-07-15 ENCOUNTER — Other Ambulatory Visit: Payer: Self-pay | Admitting: Hematology and Oncology

## 2015-09-02 ENCOUNTER — Encounter: Payer: Self-pay | Admitting: Nurse Practitioner

## 2015-09-02 ENCOUNTER — Ambulatory Visit (INDEPENDENT_AMBULATORY_CARE_PROVIDER_SITE_OTHER): Payer: Medicare Other | Admitting: Nurse Practitioner

## 2015-09-02 VITALS — BP 107/61 | HR 60 | Ht 72.0 in | Wt 128.2 lb

## 2015-09-02 DIAGNOSIS — G35 Multiple sclerosis: Secondary | ICD-10-CM | POA: Diagnosis not present

## 2015-09-02 DIAGNOSIS — R269 Unspecified abnormalities of gait and mobility: Secondary | ICD-10-CM | POA: Diagnosis not present

## 2015-09-02 DIAGNOSIS — R413 Other amnesia: Secondary | ICD-10-CM | POA: Diagnosis not present

## 2015-09-02 MED ORDER — DONEPEZIL HCL 10 MG PO TABS: 10.0000 mg | ORAL_TABLET | Freq: Every day | ORAL | Status: DC

## 2015-09-02 NOTE — Progress Notes (Signed)
[facility]  PATIENT: Melissa Oconnell DOB: 12/27/1955   REASON FOR VISIT: follow-up for multiple sclerosis, abnormality of gait and dementia HISTORY FROM:caregiver Melissa Oconnell and patient    HISTORY OF PRESENT ILLNESS :HISTORY:She has a history of gait disorder which has been going on for several years, she lives with her [place], MRI of the brain in 2013 shows multiple brainstem, cerebellar, subcortical and corpus callosum white matter hyperintensities suspicious for demyelinating disease. Presence of T1 black holes and atrophy indicate chronic disease. Patient has never been on any immune modulating therapy. Her mom and her sister both died of MS at early ages. She has received some physical therapy since last seen which was beneficial.  She has had an abnormal visual evoked response. She had greater than 5 oligoclonal bands in her CSF with LP done 06/11/2012.  She denies any falls since last seen. She is not using an assistive device. Her memory is stable UPDATE Dec 07 2014: She ambulate with a walker, slow worsening gait difficulty, difficult to bend her right leg, she sleeps well, has good appetite, worsening memory trouble UPDATE January 25 2015:She did not have her MRI of brain with without contrast as scheduled, walk with a walker, baclofen 10 mg 3 times a day has somewhat helpful, She also has significant memory trouble rely on her her caregiver UPDATE February 25 2015:We have reviewed MRI brain in August 2016 w/wo: many T2/FLAIR hyperintense foci in the cerebellum, brainstem, left thalamus and both hemispheres in a pattern and configuration consistent with the diagnosis of multiple sclerosis. None of the foci appears to be acute. There are no enhancing lesions. When compared to an MRI dated 05/20/2012, there is no definite interim change.Reviewed laboratory evaluation, normal negative CMP, CBC, TSH, RPR, B12 She has more trouble walking and become more  confused, fell few times, difficulty to handle daily activities, she can feed herself, sleeps well, she can dress herself, but need help sometimes,   UPDATE 09/02/2015 Melissa Oconnell, 60 year old female returns for follow-up caregiver. 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 She also has significant dementia and was placed on Aricept at her last visit and is tolerating them without side effects. She ambulates with a walker been no recent falls. She can feed herself and dress herself but she needs assistance with the other activities of daily living. Appetite is good and she is sleeping well. She was taken off baclofen due to no effect and the medication has significant side effects.No new complaints REVIEW OF SYSTEMS: Full 14 system review of systems performed and notable only for those listed, all others are neg:  Constitutional: neg  Cardiovascular: neg Ear/Nose/Throat: neg  Skin: neg Eyes: neg Respiratory: neg Gastroitestinal: neg  Hematology/Lymphatic: neg  Endocrine: neg Musculoskeletal:gait abnormality Allergy/Immunology: neg Neurological: dementia Psychiatric: neg Sleep : neg   ALLERGIES: No Known Allergies  HOME MEDICATIONS: Outpatient Prescriptions Prior to Visit  Medication Sig Dispense Refill  . acetaminophen (TYLENOL) 325 MG tablet Take 325 mg by mouth every 6 (six) hours as needed for mild pain or moderate pain.     Marland Kitchen anastrozole (ARIMIDEX) 1 MG tablet Take 1 tablet (1 mg total) by mouth daily. 30 tablet 6  . Calcium Carb-Cholecalciferol (CALCIUM 600 + D) 600-200 MG-UNIT TABS Take 1 tablet by mouth daily.    Marland Kitchen donepezil (ARICEPT) 10 MG tablet Take 1 tablet (10 mg total) by mouth at bedtime. 30 tablet  6  . ENSURE (ENSURE) Take 1 Can by mouth 3 (three) times daily.    Marland Kitchen HYDROcodone-acetaminophen (NORCO) 5-325 MG per tablet Take 1 tablet by mouth every 6 (six) hours as needed for moderate pain. 90  tablet 0  . polyethylene glycol (MIRALAX / GLYCOLAX) packet      No facility-administered medications prior to visit.    PAST MEDICAL HISTORY: Past Medical History  Diagnosis Date  . Multiple sclerosis ([place])   . Memory loss   . Abnormality of gait   . Neuromuscular disorder ([place])     multiple sclerosis  . Cancer [facility])     breast  . Osteoporosis, unspecified 01/06/2014    PAST SURGICAL HISTORY: Past Surgical History  Procedure Laterality Date  . Tonsillectomy    . Mastectomy w/ sentinel node biopsy Right 12/02/2013    Procedure: TOTAL MASTECTOMY WITH SENTINEL LYMPH NODE BIOPSY;  Surgeon: Adin Hector, MD;  Location: [facility];  Service: General;  Laterality: Right;  . Diagnostic laparoscopy      "swallowed something"  . Mastectomy Right     FAMILY HISTORY: Family History  Problem Relation Age of Onset  . Cancer Mother     breast    SOCIAL HISTORY: Social History   Social History  . Marital Status: Widowed    Spouse Name: N/A  . Number of Children: 2  . Years of Education: N/A   Occupational History  . Not on file.   Social History Main Topics  . Smoking status: Former Smoker -- 1.00 packs/day for 5 years    Types: Cigarettes  . Smokeless tobacco: Never Used     Comment: "quit smoking cigarettes in 1999"  . Alcohol Use: Yes  . Drug Use: Yes    Special: Cocaine     Comment: 12/29/2014 "quit "2011"  . Sexual Activity: No   Other Topics Concern  . Not on file   Social History Narrative   Patient lives at home with Melissa Oconnell. Patient has 2 children. Patient does not work.      PHYSICAL EXAM  Filed Vitals:   09/02/15 1056  BP: 107/61  Pulse: 60  Height: 6' (1.829 m)  Weight: 128 lb 3.2 oz (58.151 kg)   Body mass index is 17.38 kg/(m^2). Gen: NAD, conversant, well nourised,  well groomed  Cardiovascular: Regular rate rhythm,  Neck: Supple, no carotid bruit. Pulmonary: Clear to auscultation bilaterally   NEUROLOGICAL  EXAM:  MENTAL STATUS: Speech/cognition: She depend on her caregiver to provide history, Mini-Mental Status Examination is 16/30 last  17 out of 30, she is not oriented to time, place, missed 3 out of 3 recalls, could not copy the figure, could not write a sentence, has difficulty world backwards , cannot draw clock  CRANIAL NERVES: CN II: Visual fields are full to confrontation. Pupil equal round reactive to light CN III, IV, VI: extraocular movement are normal. No ptosis. CN V: Facial sensation is intact to pinprick in all 3 divisions bilaterally.   CN VII: Face is symmetric with normal eye closure and smile. CN VIII: Hearing is normal to rubbing fingers CN IX, X: Palate elevates symmetrically. Phonation is normal. CN XI: Head turning and shoulder shrug are intact CN XII: Tongue is midline with normal movements and no atrophy.  MOTOR: She has mild bilateral lower extremity spasticity, right arm fixation on rapid rotating movement, moderate bilateral lower extremity spasticity, mild  bilateral hip flexion, weakness, right worse than left REFLEXES: Reflexes are hyperactive and symmetric,  Plantar responses are flexor. SENSORY: Withdraws to pain COORDINATION: Rapid alternating movements and fine finger movements are intact. There is no dysmetria on finger-to-nose and heel-knee-shin.   GAIT/STANCE: She need to push on her walker to get up from seated position, wide-based, stiff, ambulated 25 feet turns in 3 steps, ambulates with walkerRomberg is absent.  DIAGNOSTIC DATA (LABS, IMAGING, TESTING) - I reviewed patient records, labs, notes, testing and imaging myself where available.  Lab Results  Component Value Date   WBC 4.7 12/29/2014   HGB 12.2 12/29/2014   HCT 37.2 12/29/2014   MCV 93.7 12/29/2014   PLT 222 12/29/2014      Component Value Date/Time   NA 140 04/05/2015 1300   NA 138 12/30/2014 0507   K 3.8 04/05/2015 1300   K 3.7 12/30/2014 0507   CL 108 12/30/2014 0507    CO2 29 04/05/2015 1300   CO2 24 12/30/2014 0507   GLUCOSE 108 04/05/2015 1300   GLUCOSE 91 12/30/2014 0507   BUN 16.7 04/05/2015 1300   BUN 10 12/30/2014 0507   CREATININE 0.9 04/05/2015 1300   CREATININE 0.74 12/30/2014 0507   CALCIUM 9.9 04/05/2015 1300   CALCIUM 8.7* 12/30/2014 0507   PROT 7.7 11/26/2013 1018   ALBUMIN 4.1 11/26/2013 1018   AST 16 11/26/2013 1018   ALT 10 11/26/2013 1018   ALKPHOS 78 11/26/2013 1018   BILITOT 0.3 11/26/2013 1018   GFRNONAA >60 12/30/2014 0507   GFRAA >60 12/30/2014 0507       ASSESSMENT AND PLAN  60 y.o. year old female  has a past medical history of Multiple sclerosis ([place]); Memory loss; Abnormality of gait; Neuromuscular disorder ([place]);  here to follow up. Repeat MRI of the brain showed extensive lesions, also moderate atrophy,Most consistent with secondary progressive MS, has never been on immunomodulation therapy   Continue Aricept at current dose will refill Continue using walker at all times for gait difficulty risk for falls Create a safe environment, remove locks on bathroom  Doors Reduce confusion, keep familiar objects and people around, stick to a routine Use effective communication such as simple words and short sentences Reduce nighttime restlessness, a consistent nighttime routine,  avoid napping during the day Encourage good nutrition and hydration Additional 10 minutes discussing secondary progressive multiple sclerosis and answering questions for caregiver Follow-up in 6-8 monthsVst time 30 min Dennie Bible, [facility], [facility], APRN  [facility] [address] [place], [place] [address] [phone]

## 2015-09-02 NOTE — Patient Instructions (Signed)
Continue Aricept at current dose will refill Continue using walker at all times for gait difficulty risk for falls Follow-up in 6-8 months

## 2015-09-03 NOTE — Progress Notes (Signed)
I have reviewed and agreed above plan. 

## 2015-10-04 ENCOUNTER — Encounter: Payer: Self-pay | Admitting: Hematology and Oncology

## 2015-10-04 ENCOUNTER — Other Ambulatory Visit (HOSPITAL_BASED_OUTPATIENT_CLINIC_OR_DEPARTMENT_OTHER): Payer: Medicare Other

## 2015-10-04 ENCOUNTER — Telehealth: Payer: Self-pay | Admitting: Hematology and Oncology

## 2015-10-04 ENCOUNTER — Ambulatory Visit (HOSPITAL_BASED_OUTPATIENT_CLINIC_OR_DEPARTMENT_OTHER): Payer: Medicare Other | Admitting: Hematology and Oncology

## 2015-10-04 ENCOUNTER — Other Ambulatory Visit: Payer: Self-pay | Admitting: Hematology and Oncology

## 2015-10-04 ENCOUNTER — Ambulatory Visit (HOSPITAL_BASED_OUTPATIENT_CLINIC_OR_DEPARTMENT_OTHER): Payer: Medicare Other

## 2015-10-04 VITALS — BP 131/80 | HR 58 | Temp 98.0°F | Resp 17 | Ht 72.0 in | Wt 127.0 lb

## 2015-10-04 DIAGNOSIS — C50411 Malignant neoplasm of upper-outer quadrant of right female breast: Secondary | ICD-10-CM

## 2015-10-04 DIAGNOSIS — M810 Age-related osteoporosis without current pathological fracture: Secondary | ICD-10-CM

## 2015-10-04 LAB — CBC WITH DIFFERENTIAL/PLATELET
BASO%: 0.6 % (ref 0.0–2.0)
Basophils Absolute: 0 10*3/uL (ref 0.0–0.1)
EOS%: 0.9 % (ref 0.0–7.0)
Eosinophils Absolute: 0.1 10*3/uL (ref 0.0–0.5)
HCT: 38.4 % (ref 34.8–46.6)
HGB: 12.5 g/dL (ref 11.6–15.9)
LYMPH#: 2.8 10*3/uL (ref 0.9–3.3)
LYMPH%: 52 % — AB (ref 14.0–49.7)
MCH: 30.8 pg (ref 25.1–34.0)
MCHC: 32.6 g/dL (ref 31.5–36.0)
MCV: 94.6 fL (ref 79.5–101.0)
MONO#: 0.3 10*3/uL (ref 0.1–0.9)
MONO%: 6.1 % (ref 0.0–14.0)
NEUT#: 2.2 10*3/uL (ref 1.5–6.5)
NEUT%: 40.4 % (ref 38.4–76.8)
PLATELETS: 230 10*3/uL (ref 145–400)
RBC: 4.06 10*6/uL (ref 3.70–5.45)
RDW: 13.1 % (ref 11.2–14.5)
WBC: 5.4 10*3/uL (ref 3.9–10.3)

## 2015-10-04 LAB — COMPREHENSIVE METABOLIC PANEL
ALBUMIN: 3.9 g/dL (ref 3.5–5.0)
ALT: 10 U/L (ref 0–55)
AST: 13 U/L (ref 5–34)
Alkaline Phosphatase: 51 U/L (ref 40–150)
Anion Gap: 6 mEq/L (ref 3–11)
BILIRUBIN TOTAL: 0.43 mg/dL (ref 0.20–1.20)
BUN: 10.8 mg/dL (ref 7.0–26.0)
CO2: 31 mEq/L — ABNORMAL HIGH (ref 22–29)
Calcium: 9.8 mg/dL (ref 8.4–10.4)
Chloride: 104 mEq/L (ref 98–109)
Creatinine: 0.9 mg/dL (ref 0.6–1.1)
EGFR: 80 mL/min/{1.73_m2} — ABNORMAL LOW (ref >90)
GLUCOSE: 86 mg/dL (ref 70–140)
POTASSIUM: 4.2 meq/L (ref 3.5–5.1)
SODIUM: 140 meq/L (ref 136–145)
TOTAL PROTEIN: 7.6 g/dL (ref 6.4–8.3)

## 2015-10-04 MED ORDER — ANASTROZOLE 1 MG PO TABS: 1.0000 mg | ORAL_TABLET | Freq: Every day | ORAL | Status: DC

## 2015-10-04 MED ORDER — DONEPEZIL HCL 10 MG PO TABS: 10.0000 mg | ORAL_TABLET | Freq: Every day | ORAL | Status: DC

## 2015-10-04 MED ORDER — DENOSUMAB 60 MG/ML ~~LOC~~ SOLN
60.0000 mg | Freq: Once | SUBCUTANEOUS | Status: AC
  Administered 2015-10-04: 60 mg via SUBCUTANEOUS
  Filled 2015-10-04: qty 1

## 2015-10-04 NOTE — Assessment & Plan Note (Signed)
She has no teeth. She is taking calcium with vitamin D supplements. I told her the risks, benefits, side effects of using Denosumab for prevention of risk of skeletal events and she agreed to proceed. I plan to give her injection every 6 months and repeat bone density in June 2017   

## 2015-10-04 NOTE — Assessment & Plan Note (Signed)
The patient tolerated Arimidex well. I will see her back in 6 months with history and physical examination along with annual screening mammogram on the contralateral breast.  

## 2015-10-04 NOTE — Telephone Encounter (Signed)
Gave adn pritned appt sched and avs for pt for Sept °

## 2015-10-04 NOTE — Progress Notes (Signed)
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   11/08/2013 Imaging Due to dense breasts, MRI was ordered and confirmed abnormalities.   12/02/2013 Surgery She had right mastectomy and sentinel lymph node biopsy with negative margin.   12/02/2013 Pathology Results Accession: [id number], showed invasive ductal carcinoma grade 2, 2.5 cm in size.   01/13/2014 Imaging Bone density scan show severe osteoporosis with T. -2.5   02/03/2014 -  Chemotherapy The patient was started on Arimidex.   09/30/2014 -  Chemotherapy She is given Prolia for osteoporosis, q 6 months    INTERVAL HISTORY: Please see below for problem oriented charting. She denies any recent abnormal breast examination, palpable mass, abnormal breast appearance or nipple changes She denies side effects of Arimidex  REVIEW OF SYSTEMS:   Constitutional: Denies fevers, chills or abnormal weight loss Eyes: Denies blurriness of vision Ears, nose, mouth, throat, and face: Denies mucositis or sore throat Respiratory: Denies cough, dyspnea or wheezes Cardiovascular: Denies palpitation, chest discomfort or lower extremity swelling Gastrointestinal:  Denies  nausea, heartburn or change in bowel habits Skin: Denies abnormal skin rashes Lymphatics: Denies new lymphadenopathy or easy bruising Neurological:Denies numbness, tingling or new weaknesses Behavioral/Psych: Mood is stable, no new changes  All other systems were reviewed with the patient and are negative.  I have reviewed the past medical history, past surgical history, social history and family history with the patient and they are unchanged from previous note.  ALLERGIES:  has No Known Allergies.  MEDICATIONS:  Current Outpatient Prescriptions  Medication Sig Dispense Refill  . anastrozole (ARIMIDEX) 1 MG tablet Take 1 tablet (1 mg total) by mouth daily. 30 tablet 6  . Calcium Carb-Cholecalciferol (CALCIUM 600 + D) 600-200 MG-UNIT TABS Take 1 tablet by mouth daily.    Marland Kitchen donepezil (ARICEPT) 10 MG tablet Take 1 tablet (10 mg total) by mouth at bedtime. 30 tablet 6  . ENSURE (ENSURE) Take 1 Can by mouth 3 (three) times daily.    . polyethylene glycol (MIRALAX / GLYCOLAX) packet     . acetaminophen (TYLENOL) 325 MG tablet Take 325 mg by mouth every 6 (six) hours as needed for mild pain or moderate pain. Reported on 10/04/2015    . HYDROcodone-acetaminophen (NORCO) 5-325 MG per tablet Take 1 tablet by mouth every 6 (six) hours as needed for moderate pain. (Patient not taking: Reported on 10/04/2015) 90 tablet 0   No current facility-administered medications for this visit.    PHYSICAL EXAMINATION: ECOG PERFORMANCE STATUS: 1 - Symptomatic but completely ambulatory  Filed Vitals:   10/04/15 1056  BP: 131/80  Pulse: 58  Temp: 98 F (36.7 C)  Resp: 17   Filed Weights   10/04/15 1056  Weight: 127 lb (57.607 kg)    GENERAL:alert, no distress and comfortable. She looks thin SKIN: skin color, texture, turgor are normal, no rashes or significant lesions EYES: normal, Conjunctiva are pink and non-injected, sclera clear OROPHARYNX:no exudate, no erythema and lips, buccal mucosa, and  tongue normal  NECK: supple, thyroid normal size, non-tender, without nodularity LYMPH:  no palpable lymphadenopathy in the cervical, axillary or inguinal LUNGS: clear to auscultation and percussion with normal breathing effort HEART: regular rate & rhythm and no murmurs and no lower extremity edema ABDOMEN:abdomen soft, non-tender and normal bowel sounds Musculoskeletal:no cyanosis of digits and no clubbing  NEURO: alert & oriented x 3 with fluent speech, no focal motor/sensory deficits Breast exam: Well-healed mastectomy scar on the right breast with no other abnormalities. Normal breast exam on the left LABORATORY DATA:  I have reviewed the data as listed    Component Value Date/Time   NA 140 10/04/2015 1035   NA 138 12/30/2014 0507   K 4.2 10/04/2015 1035   K 3.7 12/30/2014 0507   CL 108 12/30/2014 0507   CO2 31* 10/04/2015 1035   CO2 24 12/30/2014 0507   GLUCOSE 86 10/04/2015 1035   GLUCOSE 91 12/30/2014 0507   BUN 10.8 10/04/2015 1035   BUN 10 12/30/2014 0507   CREATININE 0.9 10/04/2015 1035   CREATININE 0.74 12/30/2014 0507   CALCIUM 9.8 10/04/2015 1035   CALCIUM 8.7* 12/30/2014 0507   PROT 7.6 10/04/2015 1035   PROT 7.7 11/26/2013 1018   ALBUMIN 3.9 10/04/2015 1035   ALBUMIN 4.1 11/26/2013 1018   AST 13 10/04/2015 1035   AST 16 11/26/2013 1018   ALT 10 10/04/2015 1035   ALT 10 11/26/2013 1018   ALKPHOS 51 10/04/2015 1035   ALKPHOS 78 11/26/2013 1018   BILITOT 0.43 10/04/2015 1035   BILITOT 0.3 11/26/2013 1018   GFRNONAA >60 12/30/2014 0507   GFRAA >60 12/30/2014 0507    No results found for: SPEP, UPEP  Lab Results  Component Value Date   WBC 5.4 10/04/2015   NEUTROABS 2.2 10/04/2015   HGB 12.5 10/04/2015   HCT 38.4 10/04/2015   MCV 94.6 10/04/2015   PLT 230 10/04/2015      Chemistry      Component Value Date/Time   NA 140 10/04/2015 1035   NA 138 12/30/2014 0507   K 4.2 10/04/2015 1035   K 3.7 12/30/2014 0507   CL 108 12/30/2014 0507   CO2 31*  10/04/2015 1035   CO2 24 12/30/2014 0507   BUN 10.8 10/04/2015 1035   BUN 10 12/30/2014 0507   CREATININE 0.9 10/04/2015 1035   CREATININE 0.74 12/30/2014 0507      Component Value Date/Time   CALCIUM 9.8 10/04/2015 1035   CALCIUM 8.7* 12/30/2014 0507   ALKPHOS 51 10/04/2015 1035   ALKPHOS 78 11/26/2013 1018   AST 13 10/04/2015 1035   AST 16 11/26/2013 1018   ALT 10 10/04/2015 1035   ALT 10 11/26/2013 1018   BILITOT 0.43 10/04/2015 1035   BILITOT 0.3 11/26/2013 1018      ASSESSMENT & PLAN:  Breast cancer of upper-outer quadrant of right female breast The patient tolerated Arimidex well. I will see her back in 6 months with history and physical examination along with annual screening mammogram on the contralateral breast.   Osteoporosis She has no teeth. She is taking calcium with vitamin D supplements. I told her the risks, benefits, side effects of using Denosumab for prevention of risk of  skeletal events and she agreed to proceed. I plan to give her injection every 6 months and repeat bone density in June 2017    Orders Placed This Encounter  Procedures  . DG Bone Density    Standing Status: Future     Number of Occurrences:      Standing Expiration Date: 11/07/2016    Order Specific Question:  Reason for exam:    Answer:  osteoporosis, assess response to Rx    Order Specific Question:  Preferred imaging location?    Answer:  [facility]  . MM Digital Screening Unilat L    Standing Status: Future     Number of Occurrences:      Standing Expiration Date: 10/03/2016    Order Specific Question:  Reason for Exam (SYMPTOM  OR DIAGNOSIS REQUIRED)    Answer:  left breast screening    Order Specific Question:  Is the patient pregnant?    Answer:  No    Order Specific Question:  Preferred imaging location?    Answer:  [facility]   All questions were answered. The patient knows to call the clinic with any problems, questions or concerns. No barriers to  learning was detected. I spent 15 minutes counseling the patient face to face. The total time spent in the appointment was 20 minutes and more than 50% was on counseling and review of test results     [facility], Jaymon Dudek, MD 10/04/2015 12:31 PM

## 2016-01-17 ENCOUNTER — Other Ambulatory Visit: Payer: Medicare Other

## 2016-01-17 ENCOUNTER — Ambulatory Visit: Payer: Medicare Other

## 2016-01-31 ENCOUNTER — Ambulatory Visit
Admission: RE | Admit: 2016-01-31 | Discharge: 2016-01-31 | Disposition: A | Discharge status: Home / Self Care | Payer: Medicare Other | Source: Ambulatory Visit | Attending: Hematology and Oncology | Admitting: Hematology and Oncology

## 2016-01-31 DIAGNOSIS — M810 Age-related osteoporosis without current pathological fracture: Secondary | ICD-10-CM

## 2016-01-31 DIAGNOSIS — C50411 Malignant neoplasm of upper-outer quadrant of right female breast: Secondary | ICD-10-CM

## 2016-02-01 ENCOUNTER — Other Ambulatory Visit: Payer: Medicare Other

## 2016-02-01 ENCOUNTER — Ambulatory Visit: Payer: Medicare Other

## 2016-03-02 ENCOUNTER — Ambulatory Visit (INDEPENDENT_AMBULATORY_CARE_PROVIDER_SITE_OTHER): Payer: Medicare Other | Admitting: Nurse Practitioner

## 2016-03-02 ENCOUNTER — Encounter: Payer: Self-pay | Admitting: Nurse Practitioner

## 2016-03-02 VITALS — BP 88/53 | HR 71 | Ht 72.0 in | Wt 127.0 lb

## 2016-03-02 DIAGNOSIS — G35 Multiple sclerosis: Secondary | ICD-10-CM

## 2016-03-02 DIAGNOSIS — R269 Unspecified abnormalities of gait and mobility: Secondary | ICD-10-CM

## 2016-03-02 DIAGNOSIS — R413 Other amnesia: Secondary | ICD-10-CM | POA: Diagnosis not present

## 2016-03-02 MED ORDER — DONEPEZIL HCL 10 MG PO TABS: 10.0000 mg | ORAL_TABLET | Freq: Every day | ORAL | 8 refills | Status: DC

## 2016-03-02 NOTE — Patient Instructions (Addendum)
Continue Aricept at current dose will refill Continue using walker at all times for gait difficulty risk for falls Reduce confusion, keep familiar objects and people around, stick to a routine Reduce nighttime restlessness, a consistent nighttime routine,  avoid napping during the day Encourage good nutrition and hydration Follow-up in 8 months

## 2016-03-02 NOTE — Progress Notes (Signed)
[facility]  PATIENT: Melissa Oconnell DOB: 1956-05-18   REASON FOR VISIT: follow-up for multiple sclerosis, abnormality of gait and dementia HISTORY FROM:caregiver [name] and patient    HISTORY OF PRESENT ILLNESS :HISTORY:She has a history of gait disorder which has been going on for several years, she lives with her [place], MRI of the brain in 2013 shows multiple brainstem, cerebellar, subcortical and corpus callosum white matter hyperintensities suspicious for demyelinating disease. Presence of T1 black holes and atrophy indicate chronic disease. Patient has never been on any immune modulating therapy. Her mom and her sister both died of MS at early ages. She has received some physical therapy since last seen which was beneficial.  She has had an abnormal visual evoked response. She had greater than 5 oligoclonal bands in her CSF with LP done 06/11/2012.  She denies any falls since last seen. She is not using an assistive device. Her memory is stable UPDATE Dec 07 2014: She ambulate with a walker, slow worsening gait difficulty, difficult to bend her right leg, she sleeps well, has good appetite, worsening memory trouble UPDATE January 25 2015:She did not have her MRI of brain with without contrast as scheduled, walk with a walker, baclofen 10 mg 3 times a day has somewhat helpful, She also has significant memory trouble rely on her her caregiver UPDATE February 25 2015:We have reviewed MRI brain in August 2016 w/wo: many T2/FLAIR hyperintense foci in the cerebellum, brainstem, left thalamus and both hemispheres in a pattern and configuration consistent with the diagnosis of multiple sclerosis. None of the foci appears to be acute. There are no enhancing lesions. When compared to an MRI dated 05/20/2012, there is no definite interim change.Reviewed laboratory evaluation, normal negative CMP, CBC, TSH, RPR, B12 She has more trouble walking and become more  confused, fell few times, difficulty to handle daily activities, she can feed herself, sleeps well, she can dress herself, but need help sometimes,   UPDATE 02/16/2017CM [name], 61 year old female returns for follow-up caregiver. 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 She also has significant dementia and was placed on Aricept at her last visit and is tolerating them without side effects. She ambulates with a walker been no recent falls. She can feed herself and dress herself but she needs assistance with the other activities of daily living. Appetite is good and she is sleeping well. She was taken off baclofen due to no effect and the medication has significant side effects.No new complaints UPDATE 08/17/2017CM [name], 60 year old female returns for follow-up. She has a history of multiple sclerosis secondary progressive and has never been on immune modulating therapy. She also has significant dementia and is currently on Aricept. Her Mini-Mental Status exam is stable. She ambulates with her walker, she is a fall risk, she can feed and dress herself she needs assistance with her activities of daily living. Her appetite is good and her weight has remained stable. She sleeps well at night. She returns for reevaluation  REVIEW OF SYSTEMS: Full 14 system review of systems performed and notable only for those listed, all others are neg:  Constitutional: neg  Cardiovascular: neg Ear/Nose/Throat: neg  Skin: neg Eyes: neg Respiratory: neg Gastroitestinal: neg  Hematology/Lymphatic: neg  Endocrine: neg Musculoskeletal:gait abnormality Allergy/Immunology: neg Neurological: dementia Psychiatric: neg Sleep : neg   ALLERGIES: No Known Allergies  HOME MEDICATIONS: Outpatient Medications Prior to Visit  Medication  Sig Dispense Refill  . acetaminophen (TYLENOL) 325 MG tablet Take 325 mg by mouth every 6 (six) hours  as needed for mild pain or moderate pain. Reported on 10/04/2015    . anastrozole (ARIMIDEX) 1 MG tablet Take 1 tablet (1 mg total) by mouth daily. 30 tablet 6  . Calcium Carb-Cholecalciferol (CALCIUM 600 + D) 600-200 MG-UNIT TABS Take 1 tablet by mouth daily.    Marland Kitchen donepezil (ARICEPT) 10 MG tablet Take 1 tablet (10 mg total) by mouth at bedtime. 30 tablet 6  . ENSURE (ENSURE) Take 1 Can by mouth 3 (three) times daily.    Marland Kitchen HYDROcodone-acetaminophen (NORCO) 5-325 MG per tablet Take 1 tablet by mouth every 6 (six) hours as needed for moderate pain. (Patient not taking: Reported on 10/04/2015) 90 tablet 0  . polyethylene glycol (MIRALAX / GLYCOLAX) packet      No facility-administered medications prior to visit.     PAST MEDICAL HISTORY: Past Medical History:  Diagnosis Date  . Abnormality of gait   . Cancer ([place])    breast  . Memory loss   . Multiple sclerosis ([place])   . Neuromuscular disorder ([place])    multiple sclerosis  . Osteoporosis, unspecified 01/06/2014    PAST SURGICAL HISTORY: Past Surgical History:  Procedure Laterality Date  . DIAGNOSTIC LAPAROSCOPY     "swallowed something"  . MASTECTOMY Right   . MASTECTOMY W/ SENTINEL NODE BIOPSY Right 12/02/2013   Procedure: TOTAL MASTECTOMY WITH SENTINEL LYMPH NODE BIOPSY;  Surgeon: Adin Hector, MD;  Location: [facility];  Service: General;  Laterality: Right;  . TONSILLECTOMY      FAMILY HISTORY: Family History  Problem Relation Age of Onset  . Cancer Mother     breast    SOCIAL HISTORY: Social History   Social History  . Marital status: Widowed    Spouse name: N/A  . Number of children: 2  . Years of education: N/A   Occupational History  . Not on file.   Social History Main Topics  . Smoking status: Former Smoker    Packs/day: 1.00    Years: 5.00    Types: Cigarettes  . Smokeless tobacco: Never Used     Comment: "quit smoking cigarettes in 1999"  . Alcohol use Yes  . Drug use:     Types: Cocaine     Comment:  12/29/2014 "quit "2011"  . Sexual activity: No   Other Topics Concern  . Not on file   Social History Narrative   Patient lives at home with Melissa Oconnell. Patient has 2 children. Patient does not work.      PHYSICAL EXAM  Vitals:   03/02/16 1038  Height: 6' (1.829 m)   There is no height or weight on file to calculate BMI. Gen: NAD, conversant, well nourised,  well groomed  Cardiovascular: Regular rate rhythm,  Neck: Supple, no carotid bruit.  NEUROLOGICAL EXAM:  MENTAL STATUS: Speech/cognition:  Mini-Mental Status Examination is 17/30 last  16 out of 30, she is not oriented to time, place, missed 2 out of 3 recalls, could not copy the figure, could not write a sentence, has difficulty world backwards , cannot draw clock  CRANIAL NERVES: CN II: Visual fields are full to confrontation. Pupil equal round reactive to light CN III, IV, VI: extraocular movement are normal. No ptosis. CN V: Facial sensation is intact to pinprick in all 3 divisions bilaterally.   CN VII: Face is symmetric with normal eye closure and smile. CN  VIII: Hearing is normal to rubbing fingers CN IX, X: Palate elevates symmetrically. Phonation is normal. CN XI: Head turning and shoulder shrug are intact CN XII: Tongue is midline with normal movements and no atrophy.  MOTOR:She has mild bilateral lower extremity spasticity, right arm fixation on rapid rotating movement, moderate bilateral lower extremity spasticity, mild  bilateral hip flexion, weakness, right worse than left REFLEXES: Reflexes are hyperactive and symmetric, Plantar responses are flexor. SENSORY:Withdraws to pain COORDINATION:  Rapid alternating movements and fine finger movements are intact. There is no dysmetria on finger-to-nose and heel-knee-shin.  GAIT/STANCE:She need to push on her walker to get up from seated position, wide-based, stiff, ambulated 75 feet turns in 3 steps, ambulates with walker.  [place]  is absent.  DIAGNOSTIC DATA (LABS, IMAGING, TESTING) - I reviewed patient records, labs, notes, testing and imaging myself where available.  Lab Results  Component Value Date   WBC 5.4 10/04/2015   HGB 12.5 10/04/2015   HCT 38.4 10/04/2015   MCV 94.6 10/04/2015   PLT 230 10/04/2015      Component Value Date/Time   NA 140 10/04/2015 1035   K 4.2 10/04/2015 1035   CL 108 12/30/2014 0507   CO2 31 (H) 10/04/2015 1035   GLUCOSE 86 10/04/2015 1035   BUN 10.8 10/04/2015 1035   CREATININE 0.9 10/04/2015 1035   CALCIUM 9.8 10/04/2015 1035   PROT 7.6 10/04/2015 1035   ALBUMIN 3.9 10/04/2015 1035   AST 13 10/04/2015 1035   ALT 10 10/04/2015 1035   ALKPHOS 51 10/04/2015 1035   BILITOT 0.43 10/04/2015 1035   GFRNONAA >60 12/30/2014 0507   GFRAA >60 12/30/2014 0507       ASSESSMENT AND PLAN  60 y.o. year old female  has a past medical history of Multiple sclerosis ([place]); Memory loss; Abnormality of gait; Neuromuscular disorder ([place]);  here to follow up. Repeat MRI 02/15/15 of the brain showed extensive lesions, also moderate atrophy,Most consistent with secondary progressive MS, has never been on immunomodulation therapy   Continue Aricept at current dose will refill Continue using walker at all times for gait difficulty risk for falls Reduce confusion, keep familiar objects and people around, stick to a routine Reduce nighttime restlessness, a consistent nighttime routine,  avoid napping during the day Encourage good nutrition and hydration To prevent dehydration Follow-up in 8 months next with Dr. Luan Pulling, [facility], [facility], [facility] [address], [place], [place] [address] [phone]

## 2016-03-06 NOTE — Progress Notes (Signed)
I have reviewed and agreed above plan. 

## 2016-04-05 ENCOUNTER — Other Ambulatory Visit: Payer: Self-pay | Admitting: Hematology and Oncology

## 2016-04-05 ENCOUNTER — Telehealth: Payer: Self-pay | Admitting: Hematology and Oncology

## 2016-04-05 ENCOUNTER — Encounter: Payer: Self-pay | Admitting: Hematology and Oncology

## 2016-04-05 ENCOUNTER — Ambulatory Visit (HOSPITAL_BASED_OUTPATIENT_CLINIC_OR_DEPARTMENT_OTHER): Payer: Medicare Other | Admitting: Hematology and Oncology

## 2016-04-05 ENCOUNTER — Other Ambulatory Visit (HOSPITAL_BASED_OUTPATIENT_CLINIC_OR_DEPARTMENT_OTHER): Payer: Medicare Other

## 2016-04-05 ENCOUNTER — Ambulatory Visit (HOSPITAL_BASED_OUTPATIENT_CLINIC_OR_DEPARTMENT_OTHER): Payer: Medicare Other

## 2016-04-05 DIAGNOSIS — C50411 Malignant neoplasm of upper-outer quadrant of right female breast: Secondary | ICD-10-CM

## 2016-04-05 DIAGNOSIS — Z23 Encounter for immunization: Secondary | ICD-10-CM | POA: Diagnosis not present

## 2016-04-05 DIAGNOSIS — M810 Age-related osteoporosis without current pathological fracture: Secondary | ICD-10-CM

## 2016-04-05 DIAGNOSIS — F0390 Unspecified dementia without behavioral disturbance: Secondary | ICD-10-CM

## 2016-04-05 DIAGNOSIS — Z7189 Other specified counseling: Secondary | ICD-10-CM | POA: Diagnosis not present

## 2016-04-05 DIAGNOSIS — Z299 Encounter for prophylactic measures, unspecified: Secondary | ICD-10-CM | POA: Insufficient documentation

## 2016-04-05 DIAGNOSIS — G35 Multiple sclerosis: Secondary | ICD-10-CM | POA: Diagnosis not present

## 2016-04-05 LAB — COMPREHENSIVE METABOLIC PANEL
ALBUMIN: 3.6 g/dL (ref 3.5–5.0)
ALK PHOS: 67 U/L (ref 40–150)
ALT: 11 U/L (ref 0–55)
AST: 13 U/L (ref 5–34)
Anion Gap: 9 mEq/L (ref 3–11)
BILIRUBIN TOTAL: 0.6 mg/dL (ref 0.20–1.20)
BUN: 14.9 mg/dL (ref 7.0–26.0)
CALCIUM: 9.5 mg/dL (ref 8.4–10.4)
CO2: 26 mEq/L (ref 22–29)
Chloride: 103 mEq/L (ref 98–109)
Creatinine: 1 mg/dL (ref 0.6–1.1)
EGFR: 71 mL/min/{1.73_m2} — AB (ref >90)
GLUCOSE: 85 mg/dL (ref 70–140)
POTASSIUM: 3.7 meq/L (ref 3.5–5.1)
Sodium: 138 mEq/L (ref 136–145)
TOTAL PROTEIN: 7.8 g/dL (ref 6.4–8.3)

## 2016-04-05 LAB — CBC WITH DIFFERENTIAL/PLATELET
BASO%: 0.3 % (ref 0.0–2.0)
BASOS ABS: 0 10*3/uL (ref 0.0–0.1)
EOS ABS: 0.1 10*3/uL (ref 0.0–0.5)
EOS%: 0.8 % (ref 0.0–7.0)
HEMATOCRIT: 38.1 % (ref 34.8–46.6)
HEMOGLOBIN: 12.4 g/dL (ref 11.6–15.9)
LYMPH#: 2.5 10*3/uL (ref 0.9–3.3)
LYMPH%: 39.7 % (ref 14.0–49.7)
MCH: 30.8 pg (ref 25.1–34.0)
MCHC: 32.5 g/dL (ref 31.5–36.0)
MCV: 94.8 fL (ref 79.5–101.0)
MONO#: 0.4 10*3/uL (ref 0.1–0.9)
MONO%: 7.1 % (ref 0.0–14.0)
NEUT%: 52.1 % (ref 38.4–76.8)
NEUTROS ABS: 3.2 10*3/uL (ref 1.5–6.5)
PLATELETS: 215 10*3/uL (ref 145–400)
RBC: 4.02 10*6/uL (ref 3.70–5.45)
RDW: 13.1 % (ref 11.2–14.5)
WBC: 6.2 10*3/uL (ref 3.9–10.3)

## 2016-04-05 MED ORDER — INFLUENZA VAC SPLIT QUAD 0.5 ML IM SUSY
0.5000 mL | PREFILLED_SYRINGE | Freq: Once | INTRAMUSCULAR | Status: AC
  Administered 2016-04-05: 0.5 mL via INTRAMUSCULAR
  Filled 2016-04-05: qty 0.5

## 2016-04-05 MED ORDER — DENOSUMAB 60 MG/ML ~~LOC~~ SOLN
60.0000 mg | Freq: Once | SUBCUTANEOUS | Status: AC
  Administered 2016-04-05: 60 mg via SUBCUTANEOUS
  Filled 2016-04-05: qty 1

## 2016-04-05 MED ORDER — SODIUM CHLORIDE 0.9 % IV SOLN
Freq: Once | INTRAVENOUS | Status: AC
Start: 2016-04-05 — End: ?

## 2016-04-05 NOTE — Assessment & Plan Note (Addendum)
The patient has very debilitating comorbidities with untreated multiple sclerosis and progressive dementia. She had worsening bone density while on arimidex and had recurrent falls. I discussed with [name], her dedicated MPOA. At this point, I think the risks of adjuvant antiestrogen therapy outweighed the benefit. She agreed. My plan would be to discontinue anastrozole once her prescription runs out at the end of the month. I will see her back once a year with history and physical examination only.

## 2016-04-05 NOTE — Assessment & Plan Note (Signed)
I have a long discussion with her MPOA. We discussed the goals of care. Ultimately, we plan to discontinue adjuvant antiestrogen therapy. I will see her next your with history and physical examination only.

## 2016-04-05 NOTE — Patient Instructions (Addendum)
Denosumab injection What is this medicine? DENOSUMAB (den oh sue mab) slows bone breakdown. Prolia is used to treat osteoporosis in women after menopause and in men. Xgeva is used to prevent bone fractures and other bone problems caused by cancer bone metastases. Xgeva is also used to treat giant cell tumor of the bone. This medicine may be used for other purposes; ask your health care provider or pharmacist if you have questions. What should I tell my health care provider before I take this medicine? They need to know if you have any of these conditions: -dental disease -eczema -infection or history of infections -kidney disease or on dialysis -low blood calcium or vitamin D -malabsorption syndrome -scheduled to have surgery or tooth extraction -taking medicine that contains denosumab -thyroid or parathyroid disease -an unusual reaction to denosumab, other medicines, foods, dyes, or preservatives -pregnant or trying to get pregnant -breast-feeding How should I use this medicine? This medicine is for injection under the skin. It is given by a health care professional in a hospital or clinic setting. If you are getting Prolia, a special MedGuide will be given to you by the pharmacist with each prescription and refill. Be sure to read this information carefully each time. For Prolia, talk to your pediatrician regarding the use of this medicine in children. Special care may be needed. For Xgeva, talk to your pediatrician regarding the use of this medicine in children. While this drug may be prescribed for children as young as 13 years for selected conditions, precautions do apply. Overdosage: If you think you have taken too much of this medicine contact a poison control center or emergency room at once. NOTE: This medicine is only for you. Do not share this medicine with others. What if I miss a dose? It is important not to miss your dose. Call your doctor or health care professional if you are  unable to keep an appointment. What may interact with this medicine? Do not take this medicine with any of the following medications: -other medicines containing denosumab This medicine may also interact with the following medications: -medicines that suppress the immune system -medicines that treat cancer -steroid medicines like prednisone or cortisone This list may not describe all possible interactions. Give your health care provider a list of all the medicines, herbs, non-prescription drugs, or dietary supplements you use. Also tell them if you smoke, drink alcohol, or use illegal drugs. Some items may interact with your medicine. What should I watch for while using this medicine? Visit your doctor or health care professional for regular checks on your progress. Your doctor or health care professional may order blood tests and other tests to see how you are doing. Call your doctor or health care professional if you get a cold or other infection while receiving this medicine. Do not treat yourself. This medicine may decrease your body's ability to fight infection. You should make sure you get enough calcium and vitamin D while you are taking this medicine, unless your doctor tells you not to. Discuss the foods you eat and the vitamins you take with your health care professional. See your dentist regularly. Brush and floss your teeth as directed. Before you have any dental work done, tell your dentist you are receiving this medicine. Do not become pregnant while taking this medicine or for 5 months after stopping it. Women should inform their doctor if they wish to become pregnant or think they might be pregnant. There is a potential for serious side effects   to an unborn child. Talk to your health care professional or pharmacist for more information. What side effects may I notice from receiving this medicine? Side effects that you should report to your doctor or health care professional as soon as  possible: -allergic reactions like skin rash, itching or hives, swelling of the face, lips, or tongue -breathing problems -chest pain -fast, irregular heartbeat -feeling faint or lightheaded, falls -fever, chills, or any other sign of infection -muscle spasms, tightening, or twitches -numbness or tingling -skin blisters or bumps, or is dry, peels, or red -slow healing or unexplained pain in the mouth or jaw -unusual bleeding or bruising Side effects that usually do not require medical attention (Report these to your doctor or health care professional if they continue or are bothersome.): -muscle pain -stomach upset, gas This list may not describe all possible side effects. Call your doctor for medical advice about side effects. You may report side effects to FDA at 1-800-FDA-1088. Where should I keep my medicine? This medicine is only given in a clinic, doctor's office, or other health care setting and will not be stored at home. NOTE: This sheet is a summary. It may not cover all possible information. If you have questions about this medicine, talk to your doctor, pharmacist, or health care provider.    2016, Elsevier/Gold Standard. (2012-01-01 12:37:47) Influenza Virus Vaccine (Flucelvax) What is this medicine? INFLUENZA VIRUS VACCINE (in floo EN zuh VAHY ruhs vak SEEN) helps to reduce the risk of getting influenza also known as the flu. The vaccine only helps protect you against some strains of the flu. This medicine may be used for other purposes; ask your health care provider or pharmacist if you have questions. What should I tell my health care provider before I take this medicine? They need to know if you have any of these conditions: -bleeding disorder like hemophilia -fever or infection -Guillain-Barre syndrome or other neurological problems -immune system problems -infection with the human immunodeficiency virus (HIV) or AIDS -low blood platelet counts -multiple  sclerosis -an unusual or allergic reaction to influenza virus vaccine, other medicines, foods, dyes or preservatives -pregnant or trying to get pregnant -breast-feeding How should I use this medicine? This vaccine is for injection into a muscle. It is given by a health care professional. A copy of Vaccine Information Statements will be given before each vaccination. Read this sheet carefully each time. The sheet may change frequently. Talk to your pediatrician regarding the use of this medicine in children. Special care may be needed. Overdosage: If you think you've taken too much of this medicine contact a poison control center or emergency room at once. Overdosage: If you think you have taken too much of this medicine contact a poison control center or emergency room at once. NOTE: This medicine is only for you. Do not share this medicine with others. What if I miss a dose? This does not apply. What may interact with this medicine? -chemotherapy or radiation therapy -medicines that lower your immune system like etanercept, anakinra, infliximab, and adalimumab -medicines that treat or prevent blood clots like warfarin -phenytoin -steroid medicines like prednisone or cortisone -theophylline -vaccines This list may not describe all possible interactions. Give your health care provider a list of all the medicines, herbs, non-prescription drugs, or dietary supplements you use. Also tell them if you smoke, drink alcohol, or use illegal drugs. Some items may interact with your medicine. What should I watch for while using this medicine? Report any side effects   that do not go away within 3 days to your doctor or health care professional. Call your health care provider if any unusual symptoms occur within 6 weeks of receiving this vaccine. You may still catch the flu, but the illness is not usually as bad. You cannot get the flu from the vaccine. The vaccine will not protect against colds or other  illnesses that may cause fever. The vaccine is needed every year. What side effects may I notice from receiving this medicine? Side effects that you should report to your doctor or health care professional as soon as possible: -allergic reactions like skin rash, itching or hives, swelling of the face, lips, or tongue Side effects that usually do not require medical attention (Report these to your doctor or health care professional if they continue or are bothersome.): -fever -headache -muscle aches and pains -pain, tenderness, redness, or swelling at the injection site -tiredness This list may not describe all possible side effects. Call your doctor for medical advice about side effects. You may report side effects to FDA at 1-800-FDA-1088. Where should I keep my medicine? The vaccine will be given by a health care professional in a clinic, pharmacy, doctor's office, or other health care setting. You will not be given vaccine doses to store at home. NOTE: This sheet is a summary. It may not cover all possible information. If you have questions about this medicine, talk to your doctor, pharmacist, or health care provider.    2016, Elsevier/Gold Standard. (2011-06-14 14:06:47)  

## 2016-04-05 NOTE — Assessment & Plan Note (Signed)
We discussed the importance of preventive care and reviewed the vaccination programs. She does not have any prior allergic reactions to influenza vaccination. She agrees to proceed with influenza vaccination today and we will administer it today at the clinic.  

## 2016-04-05 NOTE — Assessment & Plan Note (Addendum)
She has worsening osteoporosis likely contributed by her poor mobility and anti-as surgeon therapy. I recommend we proceed with probably a today along with calcium & vitamin D. I plan to discontinue anti-estrogen therapy as above

## 2016-04-05 NOTE — Assessment & Plan Note (Signed)
She has progressive neurological deficits with weakness, poor mobility, and memory decline. She is seen by neurologist. I will defer to the neurologist for further management

## 2016-04-05 NOTE — Telephone Encounter (Signed)
Gave patient/care giver avs report and appointments for September 2018

## 2016-04-05 NOTE — Progress Notes (Signed)
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      11/08/2013 Imaging    Due to dense breasts, MRI was ordered and confirmed abnormalities.      12/02/2013 Surgery    She had right mastectomy and sentinel lymph node biopsy with negative margin.      12/02/2013 Pathology Results    Accession: [id number], showed invasive ductal carcinoma grade 2, 2.5 cm in size.      01/13/2014 Imaging    Bone density scan show severe osteoporosis with T. -2.5      02/03/2014 -  Chemotherapy    The patient was started on Arimidex.      09/30/2014 -  Chemotherapy    She is given Prolia for osteoporosis, q 6 months      01/31/2016 Imaging    Repeat bone density showed worsening bone density      04/05/2016 Miscellaneous    Decision is made to discontinue Arimidex by the end of September 2017       INTERVAL HISTORY: Please see below for problem oriented charting. She returns today with her healthcare power of attorney, Melissa Oconnell. According to [facility], the patient had recurrent falls and had progressive decline overall. She is mostly sitting on the chair or bed bound at most times. She have lost some  weight since I saw her. She was seen by neurologist recently. She was started on Aricept for dementia. Her MMSE is stable. No recent seizures. She denies any recent abnormal breast examination, palpable mass, abnormal breast appearance or nipple changes No recent infection  REVIEW OF SYSTEMS:   Constitutional: Denies fevers, chills or abnormal weight loss Eyes: Denies blurriness of vision Ears, nose, mouth, throat, and face: Denies mucositis or sore throat Respiratory: Denies cough, dyspnea or wheezes Cardiovascular: Denies palpitation, chest discomfort or lower extremity swelling Gastrointestinal:  Denies nausea, heartburn or change in bowel habits Skin: Denies abnormal skin rashes Lymphatics: Denies new lymphadenopathy or easy bruising Behavioral/Psych: Mood is stable, no new changes  All other systems were reviewed with the patient and are negative.  I have reviewed the past medical history, past surgical history, social history and family history with the patient and they are unchanged from previous note.  ALLERGIES:  has No Known Allergies.  MEDICATIONS:  Current Outpatient Prescriptions  Medication Sig Dispense Refill  . acetaminophen (TYLENOL) 325 MG tablet Take 325 mg by mouth every 6 (six) hours as needed for mild pain or moderate pain. Reported on 10/04/2015    . anastrozole (ARIMIDEX) 1 MG tablet Take 1 tablet (1 mg total) by mouth daily. 30 tablet 6  . Calcium Carb-Cholecalciferol (CALCIUM 600 +  D) 600-200 MG-UNIT TABS Take 1 tablet by mouth daily.    Marland Kitchen donepezil (ARICEPT) 10 MG tablet Take 1 tablet (10 mg total) by mouth at bedtime. 30 tablet 8  . ENSURE (ENSURE) Take 1 Can by mouth 3 (three) times daily.    Marland Kitchen HYDROcodone-acetaminophen (NORCO) 5-325 MG per tablet Take 1 tablet by mouth every 6 (six) hours as needed for moderate pain. 90 tablet 0  . polyethylene glycol (MIRALAX / GLYCOLAX) packet      No current facility-administered medications for this visit.     Facility-Administered Medications Ordered in Other Visits  Medication Dose Route Frequency Provider Last Rate Last Dose  . 0.9 %  sodium chloride infusion   Intravenous Once Heath Lark, MD        PHYSICAL EXAMINATION: ECOG PERFORMANCE STATUS: 3 - Symptomatic, >50% confined to bed  Vitals:   04/05/16 1113  BP: 116/77  Pulse: 66  Resp: 18  Temp: 98.1 F (36.7 C)   Filed Weights   04/05/16 1113  Weight: 124 lb 8 oz (56.5 kg)    GENERAL:alert, no distress and comfortable. She looks thin and cachectic SKIN: skin color, texture, turgor are normal, no rashes or significant lesions EYES: normal, Conjunctiva are pink and non-injected, sclera clear OROPHARYNX:no exudate, no erythema and lips, buccal mucosa, and tongue normal  NECK: supple, thyroid normal size, non-tender, without nodularity LYMPH:  no palpable lymphadenopathy in the cervical, axillary or inguinal LUNGS: clear to auscultation and percussion with normal breathing effort HEART: regular rate & rhythm and no murmurs and no lower extremity edema ABDOMEN:abdomen soft, non-tender and normal bowel sounds Musculoskeletal:no cyanosis of digits and no clubbing  NEURO: alert, noted resting tremor on her left thigh  Chest wall examination revealed well-healed mastectomy scar on the right. No other abnormalities. LABORATORY DATA:  I have reviewed the data as listed    Component Value Date/Time   NA 138 04/05/2016 1056   K 3.7 04/05/2016 1056   CL 108 12/30/2014 0507   CO2 26 04/05/2016 1056   GLUCOSE 85 04/05/2016 1056   BUN 14.9 04/05/2016 1056   CREATININE 1.0 04/05/2016 1056   CALCIUM 9.5 04/05/2016 1056   PROT 7.8 04/05/2016 1056   ALBUMIN 3.6 04/05/2016 1056   AST 13 04/05/2016 1056   ALT 11 04/05/2016 1056   ALKPHOS 67 04/05/2016 1056   BILITOT 0.60 04/05/2016 1056   GFRNONAA >60 12/30/2014 0507   GFRAA >60 12/30/2014 0507    No results found for: SPEP, UPEP  Lab Results  Component Value Date   WBC 6.2  04/05/2016   NEUTROABS 3.2 04/05/2016   HGB 12.4 04/05/2016   HCT 38.1 04/05/2016   MCV 94.8 04/05/2016   PLT 215 04/05/2016      Chemistry      Component Value Date/Time   NA 138 04/05/2016 1056   K 3.7 04/05/2016 1056   CL 108 12/30/2014 0507   CO2 26 04/05/2016 1056   BUN 14.9 04/05/2016 1056   CREATININE 1.0 04/05/2016 1056      Component Value Date/Time   CALCIUM 9.5 04/05/2016 1056   ALKPHOS 67 04/05/2016 1056   AST 13 04/05/2016 1056   ALT 11 04/05/2016 1056   BILITOT 0.60 04/05/2016 1056       RADIOGRAPHIC STUDIES:I reviewed the most recent bone density report which show worsening osteoporosis I have personally reviewed the radiological images as listed and agreed with the findings in the report.    ASSESSMENT & PLAN:  Breast  cancer of upper-outer quadrant of right female breast The patient has very debilitating comorbidities with untreated multiple sclerosis and progressive dementia. She had worsening bone density while on arimidex and had recurrent falls. I discussed with Melissa Oconnell, her dedicated MPOA. At this point, I think the risks of adjuvant antiestrogen therapy outweighed the benefit. She agreed. My plan would be to discontinue anastrozole once her prescription runs out at the end of the month. I will see her back once a year with history and physical examination only.  Osteoporosis She has worsening osteoporosis likely contributed by her poor mobility and anti-as surgeon therapy. I recommend we proceed with probably a today along with calcium & vitamin D. I plan to discontinue anti-estrogen therapy as above  Multiple sclerosis She has progressive neurological deficits with weakness, poor mobility, and memory decline. She is seen by neurologist. I will defer to the neurologist for further management  Preventive measure We discussed the importance of preventive care and reviewed the vaccination programs. She does not have any prior allergic reactions  to influenza vaccination. She agrees to proceed with influenza vaccination today and we will administer it today at the clinic.   Goals of care, counseling/discussion I have a long discussion with her MPOA. We discussed the goals of care. Ultimately, we plan to discontinue adjuvant antiestrogen therapy. I will see her next your with history and physical examination only.   No orders of the defined types were placed in this encounter.  All questions were answered. The patient knows to call the clinic with any problems, questions or concerns. No barriers to learning was detected. I spent 25 minutes counseling the patient face to face. The total time spent in the appointment was 30 minutes and more than 50% was on counseling and review of test results     [facility], Melissa Fritchman, MD 04/05/2016 12:18 PM

## 2016-10-31 ENCOUNTER — Telehealth: Payer: Self-pay | Admitting: *Deleted

## 2016-10-31 ENCOUNTER — Ambulatory Visit: Payer: Medicare Other | Admitting: Neurology

## 2016-10-31 NOTE — Telephone Encounter (Signed)
Left message for patient/caregiver letting her know that our office is without power.  Provided our number to call back to reschedule. She can see Dr. Krista Blue or NP.

## 2016-11-16 ENCOUNTER — Encounter (INDEPENDENT_AMBULATORY_CARE_PROVIDER_SITE_OTHER): Payer: Self-pay

## 2016-11-16 ENCOUNTER — Encounter: Payer: Self-pay | Admitting: Neurology

## 2016-11-16 ENCOUNTER — Ambulatory Visit (INDEPENDENT_AMBULATORY_CARE_PROVIDER_SITE_OTHER): Payer: Medicare Other | Admitting: Neurology

## 2016-11-16 VITALS — BP 112/69 | HR 76 | Ht 72.0 in | Wt 118.0 lb

## 2016-11-16 DIAGNOSIS — R269 Unspecified abnormalities of gait and mobility: Secondary | ICD-10-CM | POA: Diagnosis not present

## 2016-11-16 DIAGNOSIS — G35 Multiple sclerosis: Secondary | ICD-10-CM | POA: Diagnosis not present

## 2016-11-16 DIAGNOSIS — R413 Other amnesia: Secondary | ICD-10-CM | POA: Diagnosis not present

## 2016-11-16 DIAGNOSIS — Z299 Encounter for prophylactic measures, unspecified: Secondary | ICD-10-CM

## 2016-11-16 NOTE — Progress Notes (Addendum)
[facility]  PATIENT: Melissa Oconnell DOB: 1956/03/04   REASON FOR VISIT: follow-up for multiple sclerosis, abnormality of gait and dementia HISTORY FROM:caregiver [name] and patient  HISTORY OF PRESENT ILLNESS :HISTORY:She has a history of gait disorder which has been going on for several years, she lives with her [place], initial evaluation was in May 2016.  MRI of the brain in 2013 shows multiple brainstem, cerebellar, subcortical and corpus callosum white matter hyperintensities suspicious for demyelinating disease. Presence of T1 black holes and atrophy indicate chronic disease. Patient has never been on any immune modulating therapy. Her mom and her sister both died of MS at early ages. She has received some physical therapy since last seen which was beneficial.  She has had an abnormal visual evoked response. She had greater than 5 oligoclonal bands in her CSF with LP done 06/11/2012.  She denies any falls since last seen. She is not using an assistive device. Her memory is stable  UPDATE May 23 20161: She ambulate with a walker, slow worsening gait difficulty, difficult to bend her right leg, she sleeps well, has good appetite, worsening memory trouble UPDATE January 25 2015:She did not have her MRI of brain with without contrast as scheduled, walk with a walker, baclofen 10 mg 3 times a day has somewhat helpful, She also has significant memory trouble rely on her her caregiver UPDATE February 25 2015: MRI brain in August 2016 w/wo: many T2/FLAIR hyperintense foci in the cerebellum, brainstem, left thalamus and both hemispheres in a pattern and configuration consistent with the diagnosis of multiple sclerosis. None of the foci appears to be acute. There are no enhancing lesions. When compared to an MRI dated 05/20/2012, there is no definite interim change.Reviewed laboratory evaluation, normal negative CMP, CBC, TSH, RPR, B12 She has more trouble walking  and become more confused, fell few times, difficulty to handle daily activities, she can feed herself, sleeps well, she can dress herself, but need help sometimes,   Update Nov 17 2014 Patient is here to follow-up secondary progressive multiple sclerosis, she has never been treated with any term immunomodulation therapy, she has significant memory loss, is taking Aricept 10 mg daily, previously tried baclofen, cause significant side effect, in no improvement in her gait abnormality.  Last clinical visit was with [name] in August 20171, she still lives with her caregiver [name], she continues to worsening gait abnormality, cannot longer ambulate with walker, rely on her wheelchair, need assistant transfer, difficulty moving her right leg, she eats well, she can feed herself, she has bowel and bladder incontinence, wear depends, she is relaxed, but more forgetful, confused.  We have personally reviewed MRI of the brain without contrast in August 2016, extensive atrophy, supratentorium T2/FLAIR hyperintensity lesion, no significant change compared to 2013, no contrast enhancement.  REVIEW OF SYSTEMS: Full 14 system review of systems performed and notable only for those listed, all others are neg:     ALLERGIES: No Known Allergies  HOME MEDICATIONS: Outpatient Medications Prior to Visit  Medication Sig Dispense Refill  . acetaminophen (TYLENOL) 325 MG tablet Take 325 mg by mouth every 6 (six) hours as needed for mild pain or moderate pain. Reported on 10/04/2015    . Calcium Carb-Cholecalciferol (CALCIUM 600 + D) 600-200 MG-UNIT TABS Take 1 tablet by mouth daily.    Marland Kitchen donepezil (ARICEPT) 10 MG tablet Take 1 tablet (10 mg total) by mouth at bedtime. 30 tablet 8  . ENSURE (ENSURE) Take 1 Can  by mouth 3 (three) times daily.    . polyethylene glycol (MIRALAX / GLYCOLAX) packet     . anastrozole (ARIMIDEX) 1 MG tablet Take 1 tablet (1 mg total) by mouth daily. 30 tablet 6  . HYDROcodone-acetaminophen  (NORCO) 5-325 MG per tablet Take 1 tablet by mouth every 6 (six) hours as needed for moderate pain. 90 tablet 0   Facility-Administered Medications Prior to Visit  Medication Dose Route Frequency Provider Last Rate Last Dose  . 0.9 %  sodium chloride infusion   Intravenous Once Heath Lark, MD        PAST MEDICAL HISTORY: Past Medical History:  Diagnosis Date  . Abnormality of gait   . Cancer ([place])    breast  . Memory loss   . Multiple sclerosis ([place])   . Neuromuscular disorder ([place])    multiple sclerosis  . Osteoporosis, unspecified 01/06/2014    PAST SURGICAL HISTORY: Past Surgical History:  Procedure Laterality Date  . DIAGNOSTIC LAPAROSCOPY     "swallowed something"  . MASTECTOMY Right   . MASTECTOMY W/ SENTINEL NODE BIOPSY Right 12/02/2013   Procedure: TOTAL MASTECTOMY WITH SENTINEL LYMPH NODE BIOPSY;  Surgeon: Adin Hector, MD;  Location: [facility];  Service: General;  Laterality: Right;  . TONSILLECTOMY      FAMILY HISTORY: Family History  Problem Relation Age of Onset  . Cancer Mother     breast    SOCIAL HISTORY: Social History   Social History  . Marital status: Widowed    Spouse name: N/A  . Number of children: 2  . Years of education: N/A   Occupational History  . Not on file.   Social History Main Topics  . Smoking status: Former Smoker    Packs/day: 1.00    Years: 5.00    Types: Cigarettes  . Smokeless tobacco: Never Used     Comment: "quit smoking cigarettes in 1999"  . Alcohol use Yes  . Drug use: Yes    Types: Cocaine     Comment: 12/29/2014 "quit "2011"  . Sexual activity: No   Other Topics Concern  . Not on file   Social History Narrative   Patient lives at home with [name]. Patient has 2 children. Patient does not work.      PHYSICAL EXAM  Vitals:   11/16/16 1042  BP: 112/69  Pulse: 76  Weight: 118 lb (53.5 kg)  Height: 6' (1.829 m)   Body mass index is 16 kg/m. Gen: NAD, conversant, well nourised,  well  groomed  Cardiovascular: Regular rate rhythm,  Neck: Supple, no carotid bruit.  NEUROLOGICAL EXAM: animal naming 5 MMSE - Mini Mental State Exam 11/16/2016 03/02/2016 09/02/2015  Orientation to time 1 1 1   Orientation to Place 3 3 3   Registration 3 3 3   Attention/ Calculation 3 2 2   Recall 0 1 0  Language- name 2 objects 2 2 2   Language- repeat 1 1 1   Language- follow 3 step command 3 3 3   Language- read & follow direction 1 1 1   Write a sentence 1 0 0  Copy design 0 0 0  Total score 18 17 16      CRANIAL NERVES: CN II: Visual fields are full to confrontation. Pupil equal round reactive to light CN III, IV, VI: extraocular movement are normal. No ptosis. CN V: Facial sensation is intact to pinprick in all 3 divisions bilaterally.   CN VII: Face is symmetric with normal eye closure and smile. CN VIII:  Hearing is normal to rubbing fingers CN IX, X: Palate elevates symmetrically. Phonation is normal. CN XI: Head turning and shoulder shrug are intact CN XII: Tongue is midline with normal movements and no atrophy.  MOTOR:She has mild bilateral lower extremity spasticity, right arm fixation on rapid rotating movement, moderate bilateral lower extremity spasticity, mild  bilateral hip flexion, weakness, right worse than left REFLEXES: Reflexes are hyperactive and symmetric, Plantar responses are flexor. SENSORY:Withdraws to pain COORDINATION:  Rapid alternating movements and fine finger movements are intact. There is no dysmetria on finger-to-nose and heel-knee-shin.  GAIT/STANCE: She needs 2 people assistant to get up from seated position, dragging right leg, unsteady, right ankle plantar flexion  DIAGNOSTIC DATA (LABS, IMAGING, TESTING) - I reviewed patient records, labs, notes, testing and imaging myself where available.  Lab Results  Component Value Date   WBC 6.2 04/05/2016   HGB 12.4 04/05/2016   HCT 38.1 04/05/2016   MCV 94.8 04/05/2016   PLT 215  04/05/2016      Component Value Date/Time   NA 138 04/05/2016 1056   K 3.7 04/05/2016 1056   CL 108 12/30/2014 0507   CO2 26 04/05/2016 1056   GLUCOSE 85 04/05/2016 1056   BUN 14.9 04/05/2016 1056   CREATININE 1.0 04/05/2016 1056   CALCIUM 9.5 04/05/2016 1056   PROT 7.8 04/05/2016 1056   ALBUMIN 3.6 04/05/2016 1056   AST 13 04/05/2016 1056   ALT 11 04/05/2016 1056   ALKPHOS 67 04/05/2016 1056   BILITOT 0.60 04/05/2016 1056   GFRNONAA >60 12/30/2014 0507   GFRAA >60 12/30/2014 0507   ASSESSMENT AND PLAN  61 y.o. year old female   Secondary progressive multiple sclerosis sclerosis  We have personally reviewed repeat MRI of the brain in 2016, extensive atrophy, supratentorium lesions consistent with multiple sclerosis, no enhancing lesion  I do not think she is a good candidate for long term immunomodulation therapy Worsening gait abnormality  Increased right leg difficulty, I have prescribed right ankle brace,  Home physical therapy  Return to clinic in 6 months  Worsening dementia  Continue Aricept 10 mg daily   Marcial Pacas, M.D. Ph.D.  [facility] [address], [place] [address] Phone: [phone] Fax:      [phone]  CC: [facility]

## 2016-11-21 ENCOUNTER — Telehealth: Payer: Self-pay | Admitting: Neurology

## 2016-11-21 NOTE — Telephone Encounter (Signed)
Noted  

## 2016-11-21 NOTE — Telephone Encounter (Signed)
Spoke to [name] with [facility] Patient is going to adult day care during the day. Melissa Oconnell is going to inquire to see if services can be as out patient .

## 2016-11-22 ENCOUNTER — Other Ambulatory Visit: Payer: Self-pay | Admitting: *Deleted

## 2016-11-22 DIAGNOSIS — R269 Unspecified abnormalities of gait and mobility: Secondary | ICD-10-CM

## 2016-11-22 DIAGNOSIS — G35 Multiple sclerosis: Secondary | ICD-10-CM

## 2016-11-22 DIAGNOSIS — Z299 Encounter for prophylactic measures, unspecified: Secondary | ICD-10-CM

## 2016-11-22 DIAGNOSIS — R413 Other amnesia: Secondary | ICD-10-CM

## 2016-11-22 NOTE — Telephone Encounter (Signed)
Ok per Dr. Krista Blue for outpatient PT.  Orders placed in Epic.

## 2016-11-22 NOTE — Telephone Encounter (Signed)
Melissa Oconnell Physical therapist from [facility] health called to inform that pt goes to adult daycare Mon-Fri and in the opinion of Lemmie Evens pt is not home bound.  Lemmie Evens made the suggestion that the same adult daycare [facility] that picks up pt to take her to the daycare could also take pt to Outpatient physical therapy.  Marla feel Outpt therapy would be best for pt.  Lemmie Evens can be reached at [phone]

## 2016-11-23 NOTE — Telephone Encounter (Signed)
Referral sent to outpatient therapy.

## 2016-12-08 ENCOUNTER — Ambulatory Visit (HOSPITAL_COMMUNITY)
Admission: EM | Admit: 2016-12-08 | Discharge: 2016-12-08 | Disposition: A | Discharge status: Home / Self Care | Payer: Medicare Other | Attending: Internal Medicine | Admitting: Internal Medicine

## 2016-12-08 ENCOUNTER — Encounter (HOSPITAL_COMMUNITY): Payer: Self-pay | Admitting: Emergency Medicine

## 2016-12-08 DIAGNOSIS — K5900 Constipation, unspecified: Secondary | ICD-10-CM | POA: Diagnosis not present

## 2016-12-08 MED ORDER — MAGNESIUM CITRATE PO SOLN: 2.0000 | Freq: Once | ORAL | 1 refills | Status: AC

## 2016-12-08 NOTE — ED Provider Notes (Signed)
CSN: [id number]     Arrival date & time 12/08/16  1902 History   First MD Initiated Contact with Patient 12/08/16 2000     Chief Complaint  Patient presents with  . Constipation   (Consider location/radiation/quality/duration/timing/severity/associated sxs/prior Treatment) 61 year old female presents to clinic with a one-week history of constipation. States she has not had a bowel movement in 7 days. She denies any nausea, has had no vomiting, states she's continue to take MiraLAX, and has tried some over-the-counter milk of Magnesia without success. States she has some mild abdominal pain, and distention, however she states she not in significant pain. She does not drink a lot of water, urinates 4-5 times a day.   The history is provided by the patient and a caregiver.  Constipation  Associated symptoms: no abdominal pain, no nausea and no vomiting     Past Medical History:  Diagnosis Date  . Abnormality of gait   . Cancer ([place])    breast  . Memory loss   . Multiple sclerosis ([place])   . Neuromuscular disorder ([place])    multiple sclerosis  . Osteoporosis, unspecified 01/06/2014   Past Surgical History:  Procedure Laterality Date  . DIAGNOSTIC LAPAROSCOPY     "swallowed something"  . MASTECTOMY Right   . MASTECTOMY W/ SENTINEL NODE BIOPSY Right 12/02/2013   Procedure: TOTAL MASTECTOMY WITH SENTINEL LYMPH NODE BIOPSY;  Surgeon: Adin Hector, MD;  Location: [facility];  Service: General;  Laterality: Right;  . TONSILLECTOMY     Family History  Problem Relation Age of Onset  . Cancer Mother        breast   Social History  Substance Use Topics  . Smoking status: Former Smoker    Packs/day: 1.00    Years: 5.00    Types: Cigarettes  . Smokeless tobacco: Never Used     Comment: "quit smoking cigarettes in 1999"  . Alcohol use Yes   OB History    No data available     Review of Systems  Constitutional: Negative.   HENT: Negative.   Respiratory: Negative.   Cardiovascular:  Negative.   Gastrointestinal: Positive for abdominal distention and constipation. Negative for abdominal pain, nausea and vomiting.  Musculoskeletal: Negative.   Skin: Negative.   Neurological: Negative.     Allergies  Patient has no known allergies.  Home Medications   Prior to Admission medications   Medication Sig Start Date End Date Taking? Authorizing Provider  donepezil (ARICEPT) 10 MG tablet Take 1 tablet (10 mg total) by mouth at bedtime. 03/02/16  Yes Dennie Bible, NP  ENSURE (ENSURE) Take 1 Can by mouth 3 (three) times daily.   Yes [provider]  polyethylene glycol (MIRALAX / GLYCOLAX) packet  03/14/15  Yes [provider]  acetaminophen (TYLENOL) 325 MG tablet Take 325 mg by mouth every 6 (six) hours as needed for mild pain or moderate pain. Reported on 10/04/2015    [provider]  Calcium Carb-Cholecalciferol (CALCIUM 600 + D) 600-200 MG-UNIT TABS Take 1 tablet by mouth daily.    [provider]  magnesium citrate SOLN Take 592 mLs (2 Bottles total) by mouth once. 12/08/16 12/08/16  Barnet Glasgow, NP   Meds Ordered and Administered this Visit  Medications - No data to display  BP 120/66 (BP Location: Left Arm)   Pulse 76   Temp 99.1 F (37.3 C) (Oral)   Resp 20   SpO2 99%  No data found.   Physical Exam  Constitutional: She is oriented to person, place, and time. She appears well-developed and well-nourished. No distress.  HENT:  Head: Normocephalic.  Right Ear: External ear normal.  Left Ear: External ear normal.  Eyes: Conjunctivae are normal.  Neck: Normal range of motion.  Cardiovascular: Normal rate and regular rhythm.   Pulmonary/Chest: Effort normal and breath sounds normal.  Abdominal: Soft. Normal appearance. Bowel sounds are decreased. There is no tenderness.  Bowel sounds present, however decreased.  Neurological: She is alert and oriented to person, place, and time.  Skin: Skin is warm and dry.  Capillary refill takes less than 2 seconds. She is not diaphoretic.  Psychiatric: She has a normal mood and affect. Her behavior is normal.  Nursing note and vitals reviewed.   Urgent Care Course     Procedures (including critical care time)  Labs Review Labs Reviewed - No data to display  Imaging Review No results found.    MDM   1. Constipation, unspecified constipation type    Continue MiraLAX, start magnesium citrate, and prune juice. If no bowel movement 24 hours go to the ER.    Barnet Glasgow, NP 12/08/16 2014

## 2016-12-08 NOTE — Discharge Instructions (Signed)
For your constipation, drink lots of water, continue taking her MiraLAX as prescribed, I have added mag citrate, drink one bottle every 4 hours and to have a bowel movement, I also recommend you pick up a bottle of prune juice at the grocery store. If you do not have a bowel movement in the next 24 hours, go to the emergency room.

## 2016-12-08 NOTE — ED Triage Notes (Signed)
Caregiver brings pt in for constipation x10 days  LBM - 5/14  Sx today include abd pain, abd distention  Has been taking Mirialax and OTC stool softener and Milk of Magnesia  w/no relief   Brought back on wheelchair.

## 2016-12-09 ENCOUNTER — Emergency Department (HOSPITAL_COMMUNITY): Payer: Medicare Other

## 2016-12-09 ENCOUNTER — Encounter (HOSPITAL_COMMUNITY): Payer: Self-pay

## 2016-12-09 ENCOUNTER — Inpatient Hospital Stay (HOSPITAL_COMMUNITY)
Admission: EM | Admit: 2016-12-09 | Discharge: 2016-12-26 | DRG: 003 | Disposition: A | Discharge status: Other Acute Inpatient Hospital | Payer: Medicare Other | Attending: Pulmonary Disease | Admitting: Pulmonary Disease

## 2016-12-09 DIAGNOSIS — G9341 Metabolic encephalopathy: Secondary | ICD-10-CM | POA: Diagnosis present

## 2016-12-09 DIAGNOSIS — I319 Disease of pericardium, unspecified: Secondary | ICD-10-CM | POA: Diagnosis not present

## 2016-12-09 DIAGNOSIS — J150 Pneumonia due to Klebsiella pneumoniae: Secondary | ICD-10-CM | POA: Diagnosis not present

## 2016-12-09 DIAGNOSIS — D649 Anemia, unspecified: Secondary | ICD-10-CM | POA: Diagnosis present

## 2016-12-09 DIAGNOSIS — E162 Hypoglycemia, unspecified: Secondary | ICD-10-CM | POA: Diagnosis not present

## 2016-12-09 DIAGNOSIS — G35 Multiple sclerosis: Secondary | ICD-10-CM

## 2016-12-09 DIAGNOSIS — R6521 Severe sepsis with septic shock: Secondary | ICD-10-CM | POA: Diagnosis not present

## 2016-12-09 DIAGNOSIS — J9600 Acute respiratory failure, unspecified whether with hypoxia or hypercapnia: Secondary | ICD-10-CM

## 2016-12-09 DIAGNOSIS — I469 Cardiac arrest, cause unspecified: Secondary | ICD-10-CM

## 2016-12-09 DIAGNOSIS — J9811 Atelectasis: Secondary | ICD-10-CM | POA: Diagnosis not present

## 2016-12-09 DIAGNOSIS — K659 Peritonitis, unspecified: Secondary | ICD-10-CM

## 2016-12-09 DIAGNOSIS — M7989 Other specified soft tissue disorders: Secondary | ICD-10-CM | POA: Diagnosis not present

## 2016-12-09 DIAGNOSIS — E46 Unspecified protein-calorie malnutrition: Secondary | ICD-10-CM | POA: Diagnosis present

## 2016-12-09 DIAGNOSIS — I313 Pericardial effusion (noninflammatory): Secondary | ICD-10-CM | POA: Diagnosis not present

## 2016-12-09 DIAGNOSIS — N179 Acute kidney failure, unspecified: Secondary | ICD-10-CM | POA: Diagnosis present

## 2016-12-09 DIAGNOSIS — Z87891 Personal history of nicotine dependence: Secondary | ICD-10-CM

## 2016-12-09 DIAGNOSIS — J9601 Acute respiratory failure with hypoxia: Secondary | ICD-10-CM

## 2016-12-09 DIAGNOSIS — M810 Age-related osteoporosis without current pathological fracture: Secondary | ICD-10-CM | POA: Diagnosis present

## 2016-12-09 DIAGNOSIS — Z1629 Resistance to other single specified antibiotic: Secondary | ICD-10-CM | POA: Diagnosis present

## 2016-12-09 DIAGNOSIS — R579 Shock, unspecified: Secondary | ICD-10-CM | POA: Diagnosis not present

## 2016-12-09 DIAGNOSIS — J9690 Respiratory failure, unspecified, unspecified whether with hypoxia or hypercapnia: Secondary | ICD-10-CM

## 2016-12-09 DIAGNOSIS — R269 Unspecified abnormalities of gait and mobility: Secondary | ICD-10-CM

## 2016-12-09 DIAGNOSIS — Y95 Nosocomial condition: Secondary | ICD-10-CM | POA: Diagnosis present

## 2016-12-09 DIAGNOSIS — E870 Hyperosmolality and hypernatremia: Secondary | ICD-10-CM | POA: Diagnosis not present

## 2016-12-09 DIAGNOSIS — G35D Multiple sclerosis, unspecified: Secondary | ICD-10-CM | POA: Diagnosis present

## 2016-12-09 DIAGNOSIS — J690 Pneumonitis due to inhalation of food and vomit: Secondary | ICD-10-CM | POA: Diagnosis not present

## 2016-12-09 DIAGNOSIS — E872 Acidosis: Secondary | ICD-10-CM | POA: Diagnosis present

## 2016-12-09 DIAGNOSIS — Z7189 Other specified counseling: Secondary | ICD-10-CM

## 2016-12-09 DIAGNOSIS — I472 Ventricular tachycardia: Secondary | ICD-10-CM | POA: Diagnosis not present

## 2016-12-09 DIAGNOSIS — R413 Other amnesia: Secondary | ICD-10-CM | POA: Diagnosis present

## 2016-12-09 DIAGNOSIS — F0390 Unspecified dementia without behavioral disturbance: Secondary | ICD-10-CM | POA: Diagnosis present

## 2016-12-09 DIAGNOSIS — J449 Chronic obstructive pulmonary disease, unspecified: Secondary | ICD-10-CM | POA: Diagnosis present

## 2016-12-09 DIAGNOSIS — L7682 Other postprocedural complications of skin and subcutaneous tissue: Secondary | ICD-10-CM

## 2016-12-09 DIAGNOSIS — R471 Dysarthria and anarthria: Secondary | ICD-10-CM | POA: Diagnosis present

## 2016-12-09 DIAGNOSIS — R531 Weakness: Secondary | ICD-10-CM

## 2016-12-09 DIAGNOSIS — Z299 Encounter for prophylactic measures, unspecified: Secondary | ICD-10-CM

## 2016-12-09 DIAGNOSIS — A419 Sepsis, unspecified organism: Secondary | ICD-10-CM | POA: Diagnosis not present

## 2016-12-09 DIAGNOSIS — J90 Pleural effusion, not elsewhere classified: Secondary | ICD-10-CM | POA: Diagnosis not present

## 2016-12-09 DIAGNOSIS — R197 Diarrhea, unspecified: Secondary | ICD-10-CM | POA: Diagnosis present

## 2016-12-09 DIAGNOSIS — R198 Other specified symptoms and signs involving the digestive system and abdomen: Secondary | ICD-10-CM

## 2016-12-09 DIAGNOSIS — R739 Hyperglycemia, unspecified: Secondary | ICD-10-CM | POA: Diagnosis present

## 2016-12-09 DIAGNOSIS — I82612 Acute embolism and thrombosis of superficial veins of left upper extremity: Secondary | ICD-10-CM | POA: Diagnosis not present

## 2016-12-09 DIAGNOSIS — I951 Orthostatic hypotension: Secondary | ICD-10-CM

## 2016-12-09 DIAGNOSIS — Z853 Personal history of malignant neoplasm of breast: Secondary | ICD-10-CM

## 2016-12-09 DIAGNOSIS — Z9889 Other specified postprocedural states: Secondary | ICD-10-CM

## 2016-12-09 DIAGNOSIS — Z95828 Presence of other vascular implants and grafts: Secondary | ICD-10-CM | POA: Diagnosis not present

## 2016-12-09 DIAGNOSIS — Z681 Body mass index (BMI) 19 or less, adult: Secondary | ICD-10-CM | POA: Diagnosis not present

## 2016-12-09 DIAGNOSIS — N390 Urinary tract infection, site not specified: Secondary | ICD-10-CM | POA: Diagnosis present

## 2016-12-09 DIAGNOSIS — J9621 Acute and chronic respiratory failure with hypoxia: Secondary | ICD-10-CM | POA: Diagnosis not present

## 2016-12-09 DIAGNOSIS — I2699 Other pulmonary embolism without acute cor pulmonale: Secondary | ICD-10-CM | POA: Diagnosis not present

## 2016-12-09 DIAGNOSIS — E876 Hypokalemia: Secondary | ICD-10-CM | POA: Diagnosis not present

## 2016-12-09 DIAGNOSIS — E871 Hypo-osmolality and hyponatremia: Secondary | ICD-10-CM | POA: Diagnosis present

## 2016-12-09 DIAGNOSIS — K5720 Diverticulitis of large intestine with perforation and abscess without bleeding: Secondary | ICD-10-CM | POA: Diagnosis present

## 2016-12-09 DIAGNOSIS — I639 Cerebral infarction, unspecified: Secondary | ICD-10-CM

## 2016-12-09 LAB — I-STAT ARTERIAL BLOOD GAS, ED
Acid-base deficit: 5 mmol/L — ABNORMAL HIGH (ref 0.0–2.0)
BICARBONATE: 19.5 mmol/L — AB (ref 20.0–28.0)
O2 SAT: 100 %
PCO2 ART: 32.3 mmHg (ref 32.0–48.0)
Patient temperature: 99
TCO2: 20 mmol/L (ref 0–100)
pH, Arterial: 7.391 (ref 7.350–7.450)
pO2, Arterial: 323 mmHg — ABNORMAL HIGH (ref 83.0–108.0)

## 2016-12-09 LAB — OCCULT BLOOD GASTRIC / DUODENUM (SPECIMEN CUP): Occult Blood, Gastric: NEGATIVE

## 2016-12-09 LAB — DIFFERENTIAL
Basophils Absolute: 0 10*3/uL (ref 0.0–0.1)
Basophils Relative: 1 %
EOS ABS: 0 10*3/uL (ref 0.0–0.7)
Eosinophils Relative: 0 %
LYMPHS PCT: 16 %
Lymphs Abs: 0.8 10*3/uL (ref 0.7–4.0)
Monocytes Absolute: 0.1 10*3/uL (ref 0.1–1.0)
Monocytes Relative: 3 %
NEUTROS ABS: 3.8 10*3/uL (ref 1.7–7.7)
Neutrophils Relative %: 80 %
WBC MORPHOLOGY: INCREASED

## 2016-12-09 LAB — CBC
HCT: 39 % (ref 36.0–46.0)
HEMATOCRIT: 38.7 % (ref 36.0–46.0)
HEMOGLOBIN: 12.6 g/dL (ref 12.0–15.0)
Hemoglobin: 12.6 g/dL (ref 12.0–15.0)
MCH: 30.7 pg (ref 26.0–34.0)
MCH: 31 pg (ref 26.0–34.0)
MCHC: 32.3 g/dL (ref 30.0–36.0)
MCHC: 32.6 g/dL (ref 30.0–36.0)
MCV: 95.1 fL (ref 78.0–100.0)
MCV: 95.3 fL (ref 78.0–100.0)
PLATELETS: 180 10*3/uL (ref 150–400)
Platelets: 224 10*3/uL (ref 150–400)
RBC: 4.1 MIL/uL (ref 3.87–5.11)
RBC: 4.06 MIL/uL (ref 3.87–5.11)
RDW: 13.2 % (ref 11.5–15.5)
RDW: 13.3 % (ref 11.5–15.5)
WBC: 4.7 10*3/uL (ref 4.0–10.5)
WBC: 1.6 10*3/uL — ABNORMAL LOW (ref 4.0–10.5)

## 2016-12-09 LAB — COMPREHENSIVE METABOLIC PANEL
ALBUMIN: 2.9 g/dL — AB (ref 3.5–5.0)
ALK PHOS: 60 U/L (ref 38–126)
ALT: 11 U/L — ABNORMAL LOW (ref 14–54)
ANION GAP: 13 (ref 5–15)
AST: 36 U/L (ref 15–41)
BILIRUBIN TOTAL: 0.7 mg/dL (ref 0.3–1.2)
BUN: 15 mg/dL (ref 6–20)
CALCIUM: 9.3 mg/dL (ref 8.9–10.3)
CO2: 21 mmol/L — ABNORMAL LOW (ref 22–32)
Chloride: 96 mmol/L — ABNORMAL LOW (ref 101–111)
Creatinine, Ser: 1.5 mg/dL — ABNORMAL HIGH (ref 0.44–1.00)
GFR calc Af Amer: 43 mL/min — ABNORMAL LOW (ref >60)
GFR, EST NON AFRICAN AMERICAN: 37 mL/min — AB (ref >60)
Glucose, Bld: 174 mg/dL — ABNORMAL HIGH (ref 65–99)
POTASSIUM: 3.2 mmol/L — AB (ref 3.5–5.1)
Sodium: 130 mmol/L — ABNORMAL LOW (ref 135–145)
TOTAL PROTEIN: 6 g/dL — AB (ref 6.5–8.1)

## 2016-12-09 LAB — I-STAT CHEM 8, ED
BUN: 15 mg/dL (ref 6–20)
Calcium, Ion: 1.14 mmol/L — ABNORMAL LOW (ref 1.15–1.40)
Chloride: 95 mmol/L — ABNORMAL LOW (ref 101–111)
Creatinine, Ser: 1.3 mg/dL — ABNORMAL HIGH (ref 0.44–1.00)
Glucose, Bld: 168 mg/dL — ABNORMAL HIGH (ref 65–99)
HCT: 39 % (ref 36.0–46.0)
Hemoglobin: 13.3 g/dL (ref 12.0–15.0)
POTASSIUM: 3.1 mmol/L — AB (ref 3.5–5.1)
SODIUM: 131 mmol/L — AB (ref 135–145)
TCO2: 21 mmol/L (ref 0–100)

## 2016-12-09 LAB — CREATININE, SERUM
CREATININE: 1.45 mg/dL — AB (ref 0.44–1.00)
GFR calc Af Amer: 44 mL/min — ABNORMAL LOW (ref >60)
GFR, EST NON AFRICAN AMERICAN: 38 mL/min — AB (ref >60)

## 2016-12-09 LAB — TRIGLYCERIDES: TRIGLYCERIDES: 67 mg/dL (ref <150)

## 2016-12-09 LAB — PROTIME-INR
INR: 1.24
PROTHROMBIN TIME: 15.7 s — AB (ref 11.4–15.2)

## 2016-12-09 LAB — HEMOGLOBIN AND HEMATOCRIT, BLOOD
HCT: 39.4 % (ref 36.0–46.0)
Hemoglobin: 12.6 g/dL (ref 12.0–15.0)

## 2016-12-09 LAB — POC OCCULT BLOOD, ED: Fecal Occult Bld: NEGATIVE

## 2016-12-09 LAB — MRSA PCR SCREENING: MRSA BY PCR: NEGATIVE

## 2016-12-09 LAB — PROCALCITONIN

## 2016-12-09 LAB — I-STAT TROPONIN, ED: TROPONIN I, POC: 0 ng/mL (ref 0.00–0.08)

## 2016-12-09 LAB — APTT: aPTT: 26 seconds (ref 24–36)

## 2016-12-09 LAB — GLUCOSE, CAPILLARY
GLUCOSE-CAPILLARY: 107 mg/dL — AB (ref 65–99)
Glucose-Capillary: 133 mg/dL — ABNORMAL HIGH (ref 65–99)

## 2016-12-09 LAB — I-STAT CG4 LACTIC ACID, ED: Lactic Acid, Venous: 4.97 mmol/L (ref 0.5–1.9)

## 2016-12-09 LAB — LACTIC ACID, PLASMA: Lactic Acid, Venous: 5.8 mmol/L (ref 0.5–1.9)

## 2016-12-09 MED ORDER — FENTANYL CITRATE (PF) 100 MCG/2ML IJ SOLN: 100.0000 ug | INTRAMUSCULAR | Status: DC | PRN

## 2016-12-09 MED ORDER — FENTANYL CITRATE (PF) 100 MCG/2ML IJ SOLN: 50.0000 ug | INTRAMUSCULAR | Status: DC | PRN

## 2016-12-09 MED ORDER — ACETAMINOPHEN 325 MG PO TABS: 650.0000 mg | ORAL_TABLET | ORAL | Status: DC | PRN

## 2016-12-09 MED ORDER — PIPERACILLIN-TAZOBACTAM 3.375 G IVPB 30 MIN
3.3750 g | Freq: Once | INTRAVENOUS | Status: AC
  Administered 2016-12-09: 3.375 g via INTRAVENOUS
  Filled 2016-12-09: qty 50

## 2016-12-09 MED ORDER — NOREPINEPHRINE BITARTRATE 1 MG/ML IV SOLN
0.0000 ug/min | Freq: Once | INTRAVENOUS | Status: AC
  Administered 2016-12-09: 5 ug/min via INTRAVENOUS

## 2016-12-09 MED ORDER — SODIUM CHLORIDE 0.9 % IV SOLN
INTRAVENOUS | Status: DC
  Administered 2016-12-09: 23:00:00 via INTRAVENOUS

## 2016-12-09 MED ORDER — VANCOMYCIN HCL IN DEXTROSE 750-5 MG/150ML-% IV SOLN: 750.0000 mg | Freq: Two times a day (BID) | INTRAVENOUS | Status: DC

## 2016-12-09 MED ORDER — SODIUM CHLORIDE 0.9 % IV BOLUS (SEPSIS)
1000.0000 mL | Freq: Once | INTRAVENOUS | Status: AC
  Administered 2016-12-09: 1000 mL via INTRAVENOUS

## 2016-12-09 MED ORDER — PROPOFOL 1000 MG/100ML IV EMUL: 0.0000 ug/kg/min | INTRAVENOUS | Status: DC

## 2016-12-09 MED ORDER — FENTANYL CITRATE (PF) 100 MCG/2ML IJ SOLN
50.0000 ug | INTRAMUSCULAR | Status: DC | PRN
  Administered 2016-12-09 – 2016-12-11 (×5): 50 ug via INTRAVENOUS
  Filled 2016-12-09 (×4): qty 2

## 2016-12-09 MED ORDER — ETOMIDATE 2 MG/ML IV SOLN
INTRAVENOUS | Status: AC | PRN
  Administered 2016-12-09: 8 mg via INTRAVENOUS

## 2016-12-09 MED ORDER — PIPERACILLIN-TAZOBACTAM 3.375 G IVPB
3.3750 g | Freq: Three times a day (TID) | INTRAVENOUS | Status: DC
  Administered 2016-12-09 – 2016-12-12 (×8): 3.375 g via INTRAVENOUS
  Filled 2016-12-09 (×9): qty 50

## 2016-12-09 MED ORDER — MIDAZOLAM HCL 2 MG/2ML IJ SOLN: 2.0000 mg | INTRAMUSCULAR | Status: DC | PRN

## 2016-12-09 MED ORDER — FENTANYL CITRATE (PF) 100 MCG/2ML IJ SOLN
INTRAMUSCULAR | Status: DC | PRN
  Administered 2016-12-09: 100 ug via INTRAVENOUS

## 2016-12-09 MED ORDER — NOREPINEPHRINE BITARTRATE 1 MG/ML IV SOLN
0.0000 ug/min | INTRAVENOUS | Status: DC
  Administered 2016-12-09: 17 ug/min via INTRAVENOUS
  Administered 2016-12-09 – 2016-12-10 (×2): 20 ug/min via INTRAVENOUS
  Administered 2016-12-10: 19 ug/min via INTRAVENOUS
  Administered 2016-12-10: 20 ug/min via INTRAVENOUS
  Filled 2016-12-09 (×11): qty 4

## 2016-12-09 MED ORDER — IOPAMIDOL (ISOVUE-370) INJECTION 76%
50.0000 mL | Freq: Once | INTRAVENOUS | Status: AC | PRN
Start: 2016-12-09 — End: 2016-12-09
  Administered 2016-12-09: 50 mL via INTRAVENOUS

## 2016-12-09 MED ORDER — SUCCINYLCHOLINE CHLORIDE 20 MG/ML IJ SOLN
INTRAMUSCULAR | Status: AC | PRN
  Administered 2016-12-09: 100 mg via INTRAVENOUS

## 2016-12-09 MED ORDER — PANTOPRAZOLE SODIUM 40 MG IV SOLR
40.0000 mg | Freq: Two times a day (BID) | INTRAVENOUS | Status: DC
  Administered 2016-12-09 – 2016-12-13 (×9): 40 mg via INTRAVENOUS
  Filled 2016-12-09 (×10): qty 40

## 2016-12-09 MED ORDER — IPRATROPIUM BROMIDE 0.02 % IN SOLN: 0.5000 mg | RESPIRATORY_TRACT | Status: DC | PRN

## 2016-12-09 MED ORDER — INSULIN ASPART 100 UNIT/ML ~~LOC~~ SOLN
2.0000 [IU] | SUBCUTANEOUS | Status: DC
  Administered 2016-12-09 – 2016-12-10 (×3): 2 [IU] via SUBCUTANEOUS
  Administered 2016-12-14 (×2): 4 [IU] via SUBCUTANEOUS
  Administered 2016-12-15 – 2016-12-16 (×3): 2 [IU] via SUBCUTANEOUS
  Administered 2016-12-16: 4 [IU] via SUBCUTANEOUS
  Administered 2016-12-17 (×2): 2 [IU] via SUBCUTANEOUS
  Administered 2016-12-17 (×2): 4 [IU] via SUBCUTANEOUS
  Administered 2016-12-17 – 2016-12-18 (×4): 2 [IU] via SUBCUTANEOUS

## 2016-12-09 MED ORDER — PROPOFOL 1000 MG/100ML IV EMUL
INTRAVENOUS | Status: AC
  Filled 2016-12-09: qty 100

## 2016-12-09 MED ORDER — VANCOMYCIN HCL IN DEXTROSE 1-5 GM/200ML-% IV SOLN
1000.0000 mg | Freq: Once | INTRAVENOUS | Status: AC
  Administered 2016-12-09: 1000 mg via INTRAVENOUS
  Filled 2016-12-09: qty 200

## 2016-12-09 MED ORDER — ONDANSETRON HCL 4 MG/2ML IJ SOLN: 4.0000 mg | Freq: Four times a day (QID) | INTRAMUSCULAR | Status: DC | PRN

## 2016-12-09 MED ORDER — CHLORHEXIDINE GLUCONATE 0.12% ORAL RINSE (MEDLINE KIT)
15.0000 mL | Freq: Two times a day (BID) | OROMUCOSAL | Status: DC
  Administered 2016-12-10 – 2016-12-13 (×8): 15 mL via OROMUCOSAL

## 2016-12-09 MED ORDER — POTASSIUM CHLORIDE 10 MEQ/100ML IV SOLN
10.0000 meq | INTRAVENOUS | Status: AC
  Administered 2016-12-09 – 2016-12-10 (×5): 10 meq via INTRAVENOUS
  Filled 2016-12-09 (×5): qty 100

## 2016-12-09 MED ORDER — FENTANYL CITRATE (PF) 100 MCG/2ML IJ SOLN
INTRAMUSCULAR | Status: AC
  Filled 2016-12-09: qty 2

## 2016-12-09 MED ORDER — DOCUSATE SODIUM 50 MG/5ML PO LIQD: 100.0000 mg | Freq: Two times a day (BID) | ORAL | Status: DC | PRN

## 2016-12-09 MED ORDER — HEPARIN SODIUM (PORCINE) 5000 UNIT/ML IJ SOLN
5000.0000 [IU] | Freq: Three times a day (TID) | INTRAMUSCULAR | Status: DC
  Administered 2016-12-09 – 2016-12-16 (×19): 5000 [IU] via SUBCUTANEOUS
  Filled 2016-12-09 (×20): qty 1

## 2016-12-09 MED ORDER — SODIUM CHLORIDE 0.9 % IV SOLN: 250.0000 mL | INTRAVENOUS | Status: DC | PRN

## 2016-12-09 MED ORDER — ORAL CARE MOUTH RINSE
15.0000 mL | Freq: Four times a day (QID) | OROMUCOSAL | Status: DC
  Administered 2016-12-10 – 2016-12-13 (×15): 15 mL via OROMUCOSAL

## 2016-12-09 MED ORDER — SODIUM CHLORIDE 0.9 % IV BOLUS (SEPSIS)
500.0000 mL | Freq: Once | INTRAVENOUS | Status: AC
  Administered 2016-12-09: 500 mL via INTRAVENOUS

## 2016-12-09 MED ORDER — IOPAMIDOL (ISOVUE-370) INJECTION 76%
INTRAVENOUS | Status: AC
  Filled 2016-12-09: qty 50

## 2016-12-09 MED ORDER — MIDAZOLAM HCL 2 MG/2ML IJ SOLN
INTRAMUSCULAR | Status: AC
  Filled 2016-12-09: qty 2

## 2016-12-09 MED ORDER — SODIUM CHLORIDE 0.9 % IV SOLN
0.0000 ug/kg/h | INTRAVENOUS | Status: DC
  Filled 2016-12-09: qty 2

## 2016-12-09 MED ORDER — MIDAZOLAM HCL 5 MG/5ML IJ SOLN
INTRAMUSCULAR | Status: DC | PRN
  Administered 2016-12-09: 2 mg via INTRAVENOUS

## 2016-12-09 NOTE — ED Notes (Addendum)
She follows commands   She is increasing;y more awake.  When asked if she had pain she nodded her head.  She can give me a thumbc up on both hands.  She is making good eye contact and appears to understand what I am saying to her

## 2016-12-09 NOTE — Progress Notes (Signed)
Pt transported on vent to CT and back top Trauma A. Pt's vitals remained stable throughout.

## 2016-12-09 NOTE — ED Notes (Signed)
Large light brown stool cleaned from the pts  Linens changed.  The pt attempts to help whikle we are turning her.  bp low  On levophed

## 2016-12-09 NOTE — ED Notes (Signed)
Caregiver at the bedside. 

## 2016-12-09 NOTE — ED Triage Notes (Signed)
Patient arrived by [facility] after patient activated as Code Stroke pta. Patient was with daughter at home and found patient non-verbal with left sided weakness. On arrival patient moved into Trauma room for intubation due to neuro status. Dr. Jeanell Sparrow and neuro with stroke RN at bedside. CBG 181, Sinus tach 140 on arrival. Patient had been alert and oriented and ambulatory prior to this event at 1315.

## 2016-12-09 NOTE — Progress Notes (Signed)
Pharmacy Antibiotic Note  Melissa Oconnell is a 61 y.o. female who presented to the [facility] on 12/09/2016 with L-sided weakness and non-verbal concerning for CVA. The patient was noted to have SIRs and pharmacy has been consulted for Vancomycin+ Zosyn dosing for r/o sepsis. SCr 1.3 << 1.5, CrCl~30-40 ml/min.   Vancomycin 1g IV x 1 and Zosyn 3.375g IV x 1 have already been ordered by the EDP and are appropriate.  Plan: 1. Vancomycin 1g IV x 1 followed by 750 mg IV every 24 hours 2. Zosyn 3.375g IV x 1 (over 30 min) followed by 3.375g IV every 8 hours (infused over 4 hours) 3. Will continue to follow renal function, culture results, LOT, and antibiotic de-escalation plans   Height: 5\' 8"  (172.7 cm) Weight: 110 lb (49.9 kg) IBW/kg (Calculated) : 63.9  Temp (24hrs), Avg:99.1 F (37.3 C), Min:99 F (37.2 C), Max:99.1 F (37.3 C)   Recent Labs Lab 12/09/16 1415 12/09/16 1420  WBC 4.7  --   CREATININE 1.50* 1.30*    Estimated Creatinine Clearance: 36.3 mL/min (A) (by C-G formula based on SCr of 1.3 mg/dL (H)).    No Known Allergies  Antimicrobials this admission: Vanc 5/26 >> Zosyn 5/26 >>  Dose adjustments this admission: n/a  Microbiology results: 5/26 BCx >>  Thank you for allowing pharmacy to be a part of this patient's care.  Alycia Rossetti, PharmD, BCPS Clinical Pharmacist Pager: [phone] 12/09/2016 3:27 PM

## 2016-12-09 NOTE — ED Notes (Signed)
Pt had copious amount of stool expelled from rectum while having rectal temp taken. Pt had been to urgent care yesterday for constipation.  Stool formed and soft. Light brown.

## 2016-12-09 NOTE — Progress Notes (Signed)
[place] Progress Note Patient Name: Melissa Oconnell DOB: 15-Feb-1956 MRN: [id number]   Date of Service  12/09/2016  HPI/Events of Note  Low BP and elevated HR  eICU Interventions  Bolus 1 liters NS     Intervention Category Intermediate Interventions: Hypotension - evaluation and management  Mauri Brooklyn, P 12/09/2016, 10:45 PM

## 2016-12-09 NOTE — ED Notes (Signed)
Critical care doctor at bedside  He wants the propfol  decreased

## 2016-12-09 NOTE — ED Notes (Signed)
Large light brown stool   Cleaned from the pt  Lt iv came out with turning  Iv vancomycin placed in the rt wrist iv  112ml to go

## 2016-12-09 NOTE — ED Notes (Signed)
Report to [facility] on 6m

## 2016-12-09 NOTE — ED Notes (Signed)
Pt is on the vent failed for that rreason

## 2016-12-09 NOTE — Code Documentation (Signed)
61 y.o. Female with PMHx of breast cancer, HTN, MS, memory loss and gait abnormality who was stated to be in her normal state of health today when family found her sitting in her chair with right sided weakness. [facility] was notified and called code stroke in the field. On arrival to [facility] ED, the patient was met at the bridge by the code stroke team. Patient was in respiratory distress, nonverbal and following intermittent commands. She was rushed into trauma bay A for intubation and stabilization. On assessment during intubation, patient had a dense left hemiplegia and rightward gaze. NIHSS 23. See EMR for NIHSS and code stroke times. Patient was hypotensive with SBP in the 60's. Levo gtt started as well as Propofol gtt. Once patient was stable, she was taken to CT. CTH showing no acute intracranial abnormalities. CTA with no LVO. S/p CT, reassessment NIHSS 7. Patient now following commands and MAE against gravity. No tPA given d/t symptoms rapidly resolving. Not a thrombectomy candidate d/t no LVO. ED bedside handoff with ED RN Santiago Glad

## 2016-12-09 NOTE — Progress Notes (Signed)
eLink Physician-Brief Progress Note Patient Name: Melissa Oconnell DOB: 1956-06-05 MRN: [id number]   Date of Service  12/09/2016  HPI/Events of Note  Low K  eICU Interventions  replaced     Intervention Category Minor Interventions: Electrolytes abnormality - evaluation and management  Mauri Brooklyn, P 12/09/2016, 8:36 PM

## 2016-12-09 NOTE — H&P (Signed)
Melissa Oconnell is an 61 y.o. female.   Chief Complaint: left sided weakness  HPI:   Patient intubated and sedated when I saw her in ER, history obtained from ER Physician  This is a 61 year old lady with a history of breast cancer and multiple sclerosis who is brought in via EMS with report of code stroke. Reported is that patient was at home in her usual state of health with her daughter. The daughter had been with her and she had been at baseline. The daughter then heard a thud in the room and came in to find her altered mental status and left-sided deficits. EMS reports that she has not been able to move her left side and has been decreasing to the right. The breathing was tachypneic and appeared to be having difficulty breathing.  Review of records notes that patient was seen yesterday in urgent care complaining of constipation. She was prescribed mag citrate.  While undergoing CT head, she had a large BM, she has been also putting out coffee ground from her NGT. She has had 2 more bowel movements since then in ER  Per ER nurse christine at bedside, she was waking up and followed some commands for her.  CT head negative for acute findings.    Past Medical History:  Diagnosis Date  . Abnormality of gait   . Cancer ([place])    breast  . Memory loss   . Multiple sclerosis ([place])   . Neuromuscular disorder ([place])    multiple sclerosis  . Osteoporosis, unspecified 01/06/2014    Past Surgical History:  Procedure Laterality Date  . DIAGNOSTIC LAPAROSCOPY     "swallowed something"  . MASTECTOMY Right   . MASTECTOMY W/ SENTINEL NODE BIOPSY Right 12/02/2013   Procedure: TOTAL MASTECTOMY WITH SENTINEL LYMPH NODE BIOPSY;  Surgeon: Adin Hector, MD;  Location: [facility];  Service: General;  Laterality: Right;  . TONSILLECTOMY      Family History  Problem Relation Age of Onset  . Cancer Mother        breast   Social History:  reports that she has quit smoking. Her smoking use included  Cigarettes. She has a 5.00 pack-year smoking history. She has never used smokeless tobacco. She reports that she drinks alcohol. She reports that she uses drugs, including Cocaine.  Allergies: No Known Allergies   (Not in a hospital admission)  Results for orders placed or performed during the hospital encounter of 12/09/16 (from the past 48 hour(s))  Protime-INR     Status: Abnormal   Collection Time: 12/09/16  2:15 PM  Result Value Ref Range   Prothrombin Time 15.7 (H) 11.4 - 15.2 seconds   INR 1.24   APTT     Status: None   Collection Time: 12/09/16  2:15 PM  Result Value Ref Range   aPTT 26 24 - 36 seconds  CBC     Status: None   Collection Time: 12/09/16  2:15 PM  Result Value Ref Range   WBC 4.7 4.0 - 10.5 K/uL   RBC 4.10 3.87 - 5.11 MIL/uL   Hemoglobin 12.6 12.0 - 15.0 g/dL   HCT 39.0 36.0 - 46.0 %   MCV 95.1 78.0 - 100.0 fL   MCH 30.7 26.0 - 34.0 pg   MCHC 32.3 30.0 - 36.0 g/dL   RDW 13.2 11.5 - 15.5 %   Platelets 180 150 - 400 K/uL  Differential     Status: None   Collection Time: 12/09/16  2:15 PM  Result Value Ref Range   Neutrophils Relative % 80 %   Lymphocytes Relative 16 %   Monocytes Relative 3 %   Eosinophils Relative 0 %   Basophils Relative 1 %   Neutro Abs 3.8 1.7 - 7.7 K/uL   Lymphs Abs 0.8 0.7 - 4.0 K/uL   Monocytes Absolute 0.1 0.1 - 1.0 K/uL   Eosinophils Absolute 0.0 0.0 - 0.7 K/uL   Basophils Absolute 0.0 0.0 - 0.1 K/uL   WBC Morphology INCREASED BANDS (>20% BANDS)     Comment: MILD LEFT SHIFT (1-5% METAS, OCC MYELO, OCC BANDS)  Comprehensive metabolic panel     Status: Abnormal   Collection Time: 12/09/16  2:15 PM  Result Value Ref Range   Sodium 130 (L) 135 - 145 mmol/L   Potassium 3.2 (L) 3.5 - 5.1 mmol/L   Chloride 96 (L) 101 - 111 mmol/L   CO2 21 (L) 22 - 32 mmol/L   Glucose, Bld 174 (H) 65 - 99 mg/dL   BUN 15 6 - 20 mg/dL   Creatinine, Ser 1.50 (H) 0.44 - 1.00 mg/dL   Calcium 9.3 8.9 - 10.3 mg/dL   Total Protein 6.0 (L) 6.5 - 8.1  g/dL   Albumin 2.9 (L) 3.5 - 5.0 g/dL   AST 36 15 - 41 U/L   ALT 11 (L) 14 - 54 U/L   Alkaline Phosphatase 60 38 - 126 U/L   Total Bilirubin 0.7 0.3 - 1.2 mg/dL   GFR calc non Af Amer 37 (L) >60 mL/min   GFR calc Af Amer 43 (L) >60 mL/min    Comment: (NOTE) The eGFR has been calculated using the CKD EPI equation. This calculation has not been validated in all clinical situations. eGFR's persistently <60 mL/min signify possible Chronic Kidney Disease.    Anion gap 13 5 - 15  I-stat troponin, ED     Status: None   Collection Time: 12/09/16  2:19 PM  Result Value Ref Range   Troponin i, poc 0.00 0.00 - 0.08 ng/mL   Comment 3            Comment: Due to the release kinetics of cTnI, a negative result within the first hours of the onset of symptoms does not rule out myocardial infarction with certainty. If myocardial infarction is still suspected, repeat the test at appropriate intervals.   I-stat chem 8, ed     Status: Abnormal   Collection Time: 12/09/16  2:20 PM  Result Value Ref Range   Sodium 131 (L) 135 - 145 mmol/L   Potassium 3.1 (L) 3.5 - 5.1 mmol/L   Chloride 95 (L) 101 - 111 mmol/L   BUN 15 6 - 20 mg/dL   Creatinine, Ser 1.30 (H) 0.44 - 1.00 mg/dL   Glucose, Bld 168 (H) 65 - 99 mg/dL   Calcium, Ion 1.14 (L) 1.15 - 1.40 mmol/L   TCO2 21 0 - 100 mmol/L   Hemoglobin 13.3 12.0 - 15.0 g/dL   HCT 39.0 36.0 - 46.0 %  I-Stat arterial blood gas, ED     Status: Abnormal   Collection Time: 12/09/16  2:49 PM  Result Value Ref Range   pH, Arterial 7.391 7.350 - 7.450   pCO2 arterial 32.3 32.0 - 48.0 mmHg   pO2, Arterial 323.0 (H) 83.0 - 108.0 mmHg   Bicarbonate 19.5 (L) 20.0 - 28.0 mmol/L   TCO2 20 0 - 100 mmol/L   O2 Saturation 100.0 %   Acid-base deficit  5.0 (H) 0.0 - 2.0 mmol/L   Patient temperature 99.0 F    Collection site RADIAL, ALLEN'S TEST ACCEPTABLE    Drawn by RT    Sample type ARTERIAL   Occult bld gastric/duodenum (cup to lab)     Status: None   Collection  Time: 12/09/16  4:00 PM  Result Value Ref Range   Occult Blood, Gastric NEGATIVE NEGATIVE  POC occult blood, ED RN will collect     Status: None   Collection Time: 12/09/16  4:00 PM  Result Value Ref Range   Fecal Occult Bld NEGATIVE NEGATIVE   Ct Angio Head W Or Wo Contrast  Result Date: 12/09/2016 CLINICAL DATA:  LEFT-sided deficit, subsequently reversed with improvement in hypotension. History of multiple sclerosis. History of breast cancer. EXAM: CT ANGIOGRAPHY HEAD AND NECK TECHNIQUE: Multidetector CT imaging of the head and neck was performed using the standard protocol during bolus administration of intravenous contrast. Multiplanar CT image reconstructions and MIPs were obtained to evaluate the vascular anatomy. Carotid stenosis measurements (when applicable) are obtained utilizing NASCET criteria, using the distal internal carotid diameter as the denominator. CONTRAST:  CONTRAST 50 mL Isovue 370. COMPARISON:  CT head earlier today. FINDINGS: CTA NECK Aortic arch: Standard branching. Imaged portion shows no evidence of aneurysm or dissection. No significant stenosis of the major arch vessel origins. Right carotid system: No significant atheromatous disease. No evidence of dissection, stenosis (50% or greater) or occlusion. Left carotid system: No evidence of dissection, stenosis (50% or greater) or occlusion. Vertebral arteries: Codominant. Both are diminutive reflecting basilar hypoplasia. No evidence of dissection, stenosis (50% or greater) or occlusion. Nonvascular soft tissues: Thyromegaly, question multinodular goiter. No neck masses. Endotracheal tube and orogastric tube good position. COPD. RIGHT pleural effusion. CTA HEAD Anterior circulation: The internal carotid arteries, anterior cerebral arteries, and middle cerebral arteries are widely patent. No significant stenosis, proximal occlusion, aneurysm, or vascular malformation. Posterior circulation: Diminutive. Fetal origin BILATERAL PCA.  Both vertebrals contribute to basilar formation. No significant stenosis, proximal occlusion, aneurysm, or vascular malformation. Venous sinuses: As permitted by contrast timing, patent. Anatomic variants: BILATERAL fetal PCA. Delayed phase:   No abnormal intracranial enhancement. IMPRESSION: No intracranial or extracranial stenosis of significance. 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 COPD, with RIGHT pleural effusion. Electronically Signed   By: Staci Righter M.D.   On: 12/09/2016 16:01   Ct Angio Neck W Or Wo Contrast  Result Date: 12/09/2016 CLINICAL DATA:  LEFT-sided deficit, subsequently reversed with improvement in hypotension. History of multiple sclerosis. History of breast cancer. EXAM: CT ANGIOGRAPHY HEAD AND NECK TECHNIQUE: Multidetector CT imaging of the head and neck was performed using the standard protocol during bolus administration of intravenous contrast. Multiplanar CT image reconstructions and MIPs were obtained to evaluate the vascular anatomy. Carotid stenosis measurements (when applicable) are obtained utilizing NASCET criteria, using the distal internal carotid diameter as the denominator. CONTRAST:  CONTRAST 50 mL Isovue 370. COMPARISON:  CT head earlier today. FINDINGS: CTA NECK Aortic arch: Standard branching. Imaged portion shows no evidence of aneurysm or dissection. No significant stenosis of the major arch vessel origins. Right carotid system: No significant atheromatous disease. No evidence of dissection, stenosis (50% or greater) or occlusion. Left carotid system: No evidence of dissection, stenosis (50% or greater) or occlusion. Vertebral arteries: Codominant. Both are diminutive reflecting basilar hypoplasia. No evidence of dissection, stenosis (50% or greater) or occlusion. Nonvascular soft tissues: Thyromegaly, question multinodular goiter. No neck masses. Endotracheal tube and orogastric tube  good position. COPD. RIGHT pleural effusion. CTA HEAD  Anterior circulation: The internal carotid arteries, anterior cerebral arteries, and middle cerebral arteries are widely patent. No significant stenosis, proximal occlusion, aneurysm, or vascular malformation. Posterior circulation: Diminutive. Fetal origin BILATERAL PCA. Both vertebrals contribute to basilar formation. No significant stenosis, proximal occlusion, aneurysm, or vascular malformation. Venous sinuses: As permitted by contrast timing, patent. Anatomic variants: BILATERAL fetal PCA. Delayed phase:   No abnormal intracranial enhancement. IMPRESSION: No intracranial or extracranial stenosis of significance. 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 COPD, with RIGHT pleural effusion. Electronically Signed   By: Staci Righter M.D.   On: 12/09/2016 16:01   Dg Chest Portable 1 View  Result Date: 12/09/2016 CLINICAL DATA:  Intubation. EXAM: PORTABLE CHEST 1 VIEW COMPARISON:  Nov 26, 2013 FINDINGS: An NG tube terminates below today's film. An ET tube is in good position terminating in the mid trachea. No pneumothorax. The cardiomediastinal silhouette is stable. The left base is poorly evaluated. No acute abnormalities are seen. IMPRESSION: The ETT is in good position. The NG tube terminates below today's film. No acute abnormalities otherwise seen. Electronically Signed   By: Dorise Bullion III M.D   On: 12/09/2016 14:49   Ct Head Code Stroke W/o Cm  Result Date: 12/09/2016 CLINICAL DATA:  Code stroke. LEFT-sided deficit. History of multiple sclerosis. EXAM: CT HEAD WITHOUT CONTRAST TECHNIQUE: Contiguous axial images were obtained from the base of the skull through the vertex without intravenous contrast. COMPARISON:  MR brain 02/15/2015. FINDINGS: Brain: No evidence for acute infarction, hemorrhage, mass lesion, hydrocephalus, or extra-axial fluid. Moderate cerebral and cerebellar atrophy, premature for age. Extensive white matter signal abnormality, consistent with the diagnosis of  multiple sclerosis. No definite progression from priors. Vascular: No hyperdense vessel or unexpected calcification. Skull: Normal. Negative for fracture or focal lesion. Sinuses/Orbits: No acute finding. Other: None. ASPECTS [facility] Stroke Program Early CT Score) - Ganglionic level infarction (caudate, lentiform nuclei, internal capsule, insula, M1-M3 cortex): 7 - Supraganglionic infarction (M4-M6 cortex): 3 Total score (0-10 with 10 being normal): 10 IMPRESSION: 1. Premature for age atrophy. Extensive white matter disease, correlating with the history of multiple sclerosis. No definite acute stroke. No proximal large vessel occlusion. CTA pending. 2. ASPECTS is 10. A text message with these findings was sent to the stroke neurologist at 3:25 p.m. Electronically Signed   By: Staci Righter M.D.   On: 12/09/2016 15:27    Review of Systems  Unable to perform ROS: Intubated    Blood pressure 90/70, pulse (!) 118, temperature 98.7 F (37.1 C), temperature source Temporal, resp. rate 18, height _0  (1.727 m), weight 110 lb (49.9 kg), SpO2 99 %. Physical Exam  Vitals reviewed. Constitutional: She appears distressed. She is intubated.  Cachexia, contractures.  HENT:  Head: Normocephalic and atraumatic.  Neck: Neck supple.  Cardiovascular: Normal heart sounds.  Tachycardia present.   Respiratory: Breath sounds normal. Tachypnea noted. She is intubated.  GI: She exhibits distension. There is hepatomegaly. There is tenderness. There is guarding.  Musculoskeletal: She exhibits no edema.  Neurological: She exhibits normal muscle tone.  Intubated sedated. Moving ext slowly now, she was weak early on left side but having more movements as her BP is improving.   Skin: Skin is dry.     Assessment/Plan  Initiate critical care protocols Low tidal volume lung protective ventilation strategy Start protonix iv bid since concern for coffee ground, if persists, would consult GI. Also repeat H&H in 4 hours.  Check CT A/P since belly somewhat distended and guarded.  Check biofire GI panel on stool Pan CX. Continue vancomycin and zosyn empirically. Check Echo  Continue fluid resuscitation, appears dry. Concern if weakness was related to hypoperfusion from low BP since weakness improving with improved perfusion and BP. CT head negative for acute findings. Neuro on board. May need MRI brain if weakness persists.  No family at bedside.  The patient is critically ill with multiple organ systems failure and requires high complexity decision making for assessment and support, frequent evaluation and titration of therapies, application of advanced monitoring technologies and extensive interpretation of multiple databases.   Critical Care Time devoted to patient care services described in this note is 40  Minutes. This time reflects time of care of this signee: Sharia Reeve, MD.   Pulmonary and [place] Pager: [phone]  12/09/2016, 5:09 PM    Sharia Reeve, MD 12/09/2016, 4:54 PM

## 2016-12-09 NOTE — Progress Notes (Signed)
CRITICAL VALUE ALERT  Critical Value:  Lactic acid 5.8  Date & Time Notied: 12/09/2016 22:50  Provider Notified: Dr. Tamala Julian  Orders Received/Actions taken:

## 2016-12-09 NOTE — Progress Notes (Signed)
[place] notified of 102.9 temp.

## 2016-12-09 NOTE — Progress Notes (Signed)
Pt transported on vent from ED to 2M08. Pt's vitals remained stable throughout.

## 2016-12-09 NOTE — Consult Note (Addendum)
Neurology Consultation Reason for Consult: Left-sided Weakness Referring Physician: Jeanell Sparrow, D  CC: Left-sided weakness  History is obtained from: EMS  HPI: Melissa Oconnell is a 61 y.o. female with a history of multiple sclerosis who is apparently fairly functional at baseline, ambulates without assistance and is talkative. She was in her normal state of health earlier today, and then family saw her sitting on the couch and in her normal state of health, then returned 15 minutes later and she was slumped to the side with severe dysarthria and left-sided weakness.  On arrival, she was very dyspneic, would follow commands but seem to be in respiratory distress and therefore was intubated prior to taking her to CT. She was found to be hypotensive with systolic of 60. She was therefore volume resuscitated and starting on norepinephrine with rapid improvement in her focal neurological findings and beginning to move her left side well.  She was taken for CT angiogram to ensure no large vessel occlusion.  LKW:  Reportedly 1:15 PM tpa given?: no, rapidly improving symptoms  ROS: Unable to obtain due to altered mental status.   Past Medical History:  Diagnosis Date  . Abnormality of gait   . Cancer ([place])    breast  . Memory loss   . Multiple sclerosis ([place])   . Neuromuscular disorder ([place])    multiple sclerosis  . Osteoporosis, unspecified 01/06/2014     Family History  Problem Relation Age of Onset  . Cancer Mother        breast     Social History:  reports that she has quit smoking. Her smoking use included Cigarettes. She has a 5.00 pack-year smoking history. She has never used smokeless tobacco. She reports that she drinks alcohol. She reports that she uses drugs, including Cocaine.   Exam: Current vital signs: There were no vitals taken for this visit. Vital signs in last 24 hours: Temp:  [99.1 F (37.3 C)] 99.1 F (37.3 C) (05/25 1926) Pulse Rate:  [76] 76 (05/25 1926) Resp:   [20] 20 (05/25 1926) BP: (120)/(66) 120/66 (05/25 1926) SpO2:  [99 %] 99 % (05/25 1926)   Physical Exam  Constitutional: Appears well-developed and well-nourished.  Psych: Affect appropriate to situation Eyes: No scleral injection HENT: No OP obstrucion Head: Normocephalic.  Cardiovascular: Normal rate and regular rhythm.  Respiratory: Effort normal and breath sounds normal to anterior ascultation GI: Soft.  No distension. There is no tenderness.  Skin: WDI  Neuro: Mental Status: Patient is awake, alert, She follows commands to stick out her tongue and to show me her thumb on her right arm Cranial Nerves: II: She does not clearly blink to threat from either direction Pupils are equal, round, and reactive to light.   III,IV, VI: She has right gaze preference, does not cross midline to the left V: Blinks to eyelid stimulation bilaterally VII: Her mouth is open with rapid respirations, unclear if there is focal weakness XII: Tongue is midline Motor: She has 0/5 strength in the left arm, minimal withdrawal in the left leg initially, but subsequent she holds both left arm and leg against gravity. Sensory: She has no response to noxious stimuli in the left arm, but he withdraws right arm, does not withdraw her right leg but does grimace to noxious stimuli, no response left leg Cerebellar: Does not perform  She subsequently improved to being able to move all her extremities relatively symmetrically.  I have reviewed labs in epic and the results pertinent to  this consultation are: Creatinine 1.5  I have reviewed the images obtained: CT head-negative, CT-negative  Impression: 61 year old female with focal neurological findings in the setting of severe hypertension which all resolved with resolution of this. My suspicion is that this is all driven by hypertension, an MRI may be helpful that showed other distribution of strokes.  Recommendations: 1) MRI brain 2) further  recommendations pending MRI brain   Roland Rack, MD [facility] [phone]  If 7pm- 7am, please page neurology on call as listed in [place].

## 2016-12-09 NOTE — ED Notes (Signed)
Dr.Steinl shown lactic Acid results ED-lab

## 2016-12-09 NOTE — ED Provider Notes (Signed)
[facility] DEPT Provider Note   CSN: [id number] Arrival date & time: 12/09/16  1406     History   Chief Complaint Chief Complaint  Patient presents with  . Code Stroke   Level V caveat secondary patient being incommunicative and urgency of condition HPI EMMERSYN KRATZKE is a 61 y.o. female.  HPI  This is a 61 year old lady with a history of breast cancer and multiple sclerosis who is brought in via EMS with report of code stroke. Reported is that patient was at home in her usual state of health with her daughter. The daughter had been with her and she had been at baseline. The daughter then heard a thud in the room and came in to find her altered mental status and left-sided deficits. EMS reports that she has not been able to move her left side and has been decreasing to the right. The bridge was tachypneic and appeared to be having difficulty breathing. Review of records notes that patient was seen yesterday in urgent care complaining of constipation. She has been taking me relax at home and was prescribed mag citrate.  Past Medical History:  Diagnosis Date  . Abnormality of gait   . Cancer ([place])    breast  . Memory loss   . Multiple sclerosis ([place])   . Neuromuscular disorder ([place])    multiple sclerosis  . Osteoporosis, unspecified 01/06/2014    Patient Active Problem List   Diagnosis Date Noted  . Preventive measure 04/05/2016  . Goals of care, counseling/discussion 04/05/2016  . Orthostatic hypotension 12/29/2014  . UTI (urinary tract infection) 12/29/2014  . Incisional pain 02/03/2014  . Osteoporosis 01/06/2014  . Breast cancer of upper-outer quadrant of right female breast ([place]) 12/02/2013  . Multiple sclerosis ([place]) 12/10/2012  . Memory loss 12/03/2012  . Accidental Fall 12/03/2012  . Gait Disturbance  12/03/2012    Past Surgical History:  Procedure Laterality Date  . DIAGNOSTIC LAPAROSCOPY     "swallowed something"  . MASTECTOMY Right   . MASTECTOMY W/  SENTINEL NODE BIOPSY Right 12/02/2013   Procedure: TOTAL MASTECTOMY WITH SENTINEL LYMPH NODE BIOPSY;  Surgeon: Adin Hector, MD;  Location: [facility];  Service: General;  Laterality: Right;  . TONSILLECTOMY      OB History    No data available       Home Medications    Prior to Admission medications   Medication Sig Start Date End Date Taking? Authorizing Provider  acetaminophen (TYLENOL) 325 MG tablet Take 325 mg by mouth every 6 (six) hours as needed for mild pain or moderate pain. Reported on 10/04/2015    [provider]  Calcium Carb-Cholecalciferol (CALCIUM 600 + D) 600-200 MG-UNIT TABS Take 1 tablet by mouth daily.    [provider]  donepezil (ARICEPT) 10 MG tablet Take 1 tablet (10 mg total) by mouth at bedtime. 03/02/16   Dennie Bible, NP  ENSURE (ENSURE) Take 1 Can by mouth 3 (three) times daily.    [provider]  polyethylene glycol Merril Abbe / GLYCOLAX) packet  03/14/15   [provider]    Family History Family History  Problem Relation Age of Onset  . Cancer Mother        breast    Social History Social History  Substance Use Topics  . Smoking status: Former Smoker    Packs/day: 1.00    Years: 5.00    Types: Cigarettes  . Smokeless tobacco: Never Used     Comment: "quit  smoking cigarettes in 1999"  . Alcohol use Yes     Allergies   Patient has no known allergies.   Review of Systems Review of Systems  Unable to perform ROS: Acuity of condition     Physical Exam Updated Vital Signs BP 109/71   Pulse (!) 113   Temp 99 F (37.2 C) (Temporal)   Resp (!) 29   Ht 1.727 m (5\' 8" )   Wt 49.9 kg (110 lb)   SpO2 100%   BMI 16.73 kg/m   Physical Exam  Constitutional: She appears well-developed.  Chronically ill-appearing  HENT:  Head: Normocephalic and atraumatic.  Right Ear: External ear normal.  Eyes:  Initial case to right distal cross midline  Neck: Normal range of motion. Neck supple.    Cardiovascular: Tachycardia present.   Pulmonary/Chest: She has no wheezes.  Increased work of breathing and respiratory rate  Abdominal: Soft.  Musculoskeletal:  No deformities noted  Neurological: She displays normal reflexes.  Patient attempts to look provider with verbal stimuli. She has not appeared to be able to move her left side. Dense left hemiparesis  Skin: Skin is warm.  Psychiatric: She has a normal mood and affect.  Nursing note and vitals reviewed.    ED Treatments / Results  Labs (all labs ordered are listed, but only abnormal results are displayed) Labs Reviewed  PROTIME-INR - Abnormal; Notable for the following:       Result Value   Prothrombin Time 15.7 (*)    All other components within normal limits  COMPREHENSIVE METABOLIC PANEL - Abnormal; Notable for the following:    Sodium 130 (*)    Potassium 3.2 (*)    Chloride 96 (*)    CO2 21 (*)    Glucose, Bld 174 (*)    Creatinine, Ser 1.50 (*)    Total Protein 6.0 (*)    Albumin 2.9 (*)    ALT 11 (*)    GFR calc non Af Amer 37 (*)    GFR calc Af Amer 43 (*)    All other components within normal limits  I-STAT CHEM 8, ED - Abnormal; Notable for the following:    Sodium 131 (*)    Potassium 3.1 (*)    Chloride 95 (*)    Creatinine, Ser 1.30 (*)    Glucose, Bld 168 (*)    Calcium, Ion 1.14 (*)    All other components within normal limits  I-STAT ARTERIAL BLOOD GAS, ED - Abnormal; Notable for the following:    pO2, Arterial 323.0 (*)    Bicarbonate 19.5 (*)    Acid-base deficit 5.0 (*)    All other components within normal limits  APTT  CBC  DIFFERENTIAL  TRIGLYCERIDES  I-STAT TROPOININ, ED  CBG MONITORING, ED  I-STAT CHEM 8, ED    EKG  EKG Interpretation None       Radiology Dg Chest Portable 1 View  Result Date: 12/09/2016 CLINICAL DATA:  Intubation. EXAM: PORTABLE CHEST 1 VIEW COMPARISON:  Nov 26, 2013 FINDINGS: An NG tube terminates below today's film. An ET tube is in good position  terminating in the mid trachea. No pneumothorax. The cardiomediastinal silhouette is stable. The left base is poorly evaluated. No acute abnormalities are seen. IMPRESSION: The ETT is in good position. The NG tube terminates below today's film. No acute abnormalities otherwise seen. Electronically Signed   By: Dorise Bullion III M.D   On: 12/09/2016 14:49    Procedures Procedure Name: Intubation Date/Time:  12/09/2016 2:59 PM Performed by: Pattricia Boss Pre-anesthesia Checklist: Patient identified, Suction available, Patient being monitored and Timeout performed Oxygen Delivery Method: Non-rebreather mask Preoxygenation: Pre-oxygenation with 100% oxygen Intubation Type: Rapid sequence Ventilation: Mask ventilation without difficulty Tube size: 8.0 mm Number of attempts: 1 Placement Confirmation: ETT inserted through vocal cords under direct vision,  Positive ETCO2 and Breath sounds checked- equal and bilateral Secured at: 21 cm Tube secured with: ETT holder Dental Injury: Teeth and Oropharynx as per pre-operative assessment  Comments: Post intubation cxr with good tube placement Tube fogging, bilateral bs and end tidal co 2 change      (including critical care time)  Medications Ordered in ED Medications  propofol (DIPRIVAN) 1000 MG/100ML infusion (not administered)  midazolam (VERSED) injection 2 mg (not administered)  midazolam (VERSED) injection 2 mg (not administered)  fentaNYL (SUBLIMAZE) injection 100 mcg (not administered)  fentaNYL (SUBLIMAZE) injection 100 mcg (not administered)  midazolam (VERSED) 2 MG/2ML injection (not administered)  norepinephrine (LEVOPHED) 4 mg in dextrose 5 % 250 mL (0.016 mg/mL) infusion (not administered)  fentaNYL (SUBLIMAZE) 100 MCG/2ML injection (not administered)  propofol (DIPRIVAN) 1000 MG/100ML infusion (not administered)  etomidate (AMIDATE) injection (8 mg Intravenous Given 12/09/16 1421)  succinylcholine (ANECTINE) injection (100 mg  Intravenous Given 12/09/16 1422)  norepinephrine (LEVOPHED) 4 mg in dextrose 5 % 250 mL (0.016 mg/mL) infusion (5 mcg/min Intravenous New Bag/Given 12/09/16 1430)     Initial Impression / Assessment and Plan / ED Course  I have reviewed the triage vital signs and the nursing notes.  Pertinent labs & imaging results that were available during my care of the patient were reviewed by me and considered in my medical decision making (see chart for details).    Patient assessed due to airway concerns prior to CT. Patient occurred in ED please see intubation note. Patient was initially hypotensive with systolic blood pressure in the 60s. She received a fluid bolus and normal. Started. Blood pressure increased to systolic 419. Patient appears to be moving her left side more at this time. Dr. Leonel Ramsay is in the ED and has been seeing the patient concurrently since initial evaluation at  [facility]. 3:22 PM  Nursing reports that when attempted rectal temp patient had a large amount of stooling. Rectal temp was 101.3. Sepsis orders initiated. Patient remains in ct - Dr. Leonel Ramsay planned ct angio after ct to rule lvo 4:16 PM Discussed with Dr.Kirpatrick and he is considering mri after stabilized but does not think acute cva at this time as more likely due to hypotension.  Patient continues to move all extremities well.   Discussed with Dr. Tamala Julian, on for critical care, and they will see and admit.  CRITICAL CARE Performed by: Shaune Pollack Total critical care time: 45 minutes Critical care time was exclusive of separately billable procedures and treating other patients. Critical care was necessary to treat or prevent imminent or life-threatening deterioration. Critical care was time spent personally by me on the following activities: development of treatment plan with patient and/or surrogate as well as nursing, discussions with consultants, evaluation of patient's response to treatment, examination of  patient, obtaining history from patient or surrogate, ordering and performing treatments and interventions, ordering and review of laboratory studies, ordering and review of radiographic studies, pulse oximetry and re-evaluation of patient's condition.  Final Clinical Impressions(s) / ED Diagnoses   Final diagnoses:  Stroke (cerebrum) ([place])  Stroke (cerebrum) ([facility])    New Prescriptions New Prescriptions   No medications on file  Pattricia Boss, MD 12/09/16 [phone]

## 2016-12-09 NOTE — ED Notes (Signed)
Caregiver bobby shamburger  Has the pts earrings and the clothes that were remoived

## 2016-12-09 NOTE — Progress Notes (Signed)
Pt heart rate sustaining in the high 130s low 140s. Increase demand for levophed. EKG performed. MD notified. 1L bolus ordered. Will continue to monitor.

## 2016-12-10 ENCOUNTER — Inpatient Hospital Stay (HOSPITAL_COMMUNITY): Payer: Medicare Other

## 2016-12-10 ENCOUNTER — Inpatient Hospital Stay (HOSPITAL_COMMUNITY): Payer: Medicare Other | Admitting: Anesthesiology

## 2016-12-10 ENCOUNTER — Other Ambulatory Visit: Payer: Self-pay

## 2016-12-10 ENCOUNTER — Encounter (HOSPITAL_COMMUNITY)
Admission: EM | Disposition: A | Discharge status: Nursing Home (Includes ICF) | Payer: Self-pay | Source: Home / Self Care | Attending: Pulmonary Disease

## 2016-12-10 DIAGNOSIS — A419 Sepsis, unspecified organism: Principal | ICD-10-CM

## 2016-12-10 DIAGNOSIS — R6521 Severe sepsis with septic shock: Secondary | ICD-10-CM

## 2016-12-10 HISTORY — PX: LAPAROTOMY: SHX154

## 2016-12-10 LAB — PHOSPHORUS: Phosphorus: 3 mg/dL (ref 2.5–4.6)

## 2016-12-10 LAB — BASIC METABOLIC PANEL
ANION GAP: 12 (ref 5–15)
BUN: 19 mg/dL (ref 6–20)
CALCIUM: 8.5 mg/dL — AB (ref 8.9–10.3)
CO2: 18 mmol/L — ABNORMAL LOW (ref 22–32)
Chloride: 101 mmol/L (ref 101–111)
Creatinine, Ser: 1.34 mg/dL — ABNORMAL HIGH (ref 0.44–1.00)
GFR, EST AFRICAN AMERICAN: 49 mL/min — AB (ref >60)
GFR, EST NON AFRICAN AMERICAN: 42 mL/min — AB (ref >60)
Glucose, Bld: 101 mg/dL — ABNORMAL HIGH (ref 65–99)
Potassium: 3.8 mmol/L (ref 3.5–5.1)
Sodium: 131 mmol/L — ABNORMAL LOW (ref 135–145)

## 2016-12-10 LAB — BLOOD GAS, ARTERIAL
ACID-BASE DEFICIT: 7.8 mmol/L — AB (ref 0.0–2.0)
BICARBONATE: 16.7 mmol/L — AB (ref 20.0–28.0)
Drawn by: 10006
FIO2: 50
LHR: 14 {breaths}/min
MECHVT: 500 mL
O2 Saturation: 96.2 %
PEEP/CPAP: 5 cmH2O
Patient temperature: 98.6
pCO2 arterial: 30.9 mmHg — ABNORMAL LOW (ref 32.0–48.0)
pH, Arterial: 7.352 (ref 7.350–7.450)
pO2, Arterial: 88.8 mmHg (ref 83.0–108.0)

## 2016-12-10 LAB — GLUCOSE, CAPILLARY
GLUCOSE-CAPILLARY: 97 mg/dL (ref 65–99)
Glucose-Capillary: 106 mg/dL — ABNORMAL HIGH (ref 65–99)
Glucose-Capillary: 128 mg/dL — ABNORMAL HIGH (ref 65–99)
Glucose-Capillary: 119 mg/dL — ABNORMAL HIGH (ref 65–99)
Glucose-Capillary: 136 mg/dL — ABNORMAL HIGH (ref 65–99)
Glucose-Capillary: 81 mg/dL (ref 65–99)

## 2016-12-10 LAB — POCT I-STAT 7, (LYTES, BLD GAS, ICA,H+H)
Acid-base deficit: 7 mmol/L — ABNORMAL HIGH (ref 0.0–2.0)
Bicarbonate: 18.7 mmol/L — ABNORMAL LOW (ref 20.0–28.0)
CALCIUM ION: 1.14 mmol/L — AB (ref 1.15–1.40)
HEMATOCRIT: 35 % — AB (ref 36.0–46.0)
HEMOGLOBIN: 11.9 g/dL — AB (ref 12.0–15.0)
O2 Saturation: 97 %
Potassium: 4.7 mmol/L (ref 3.5–5.1)
SODIUM: 131 mmol/L — AB (ref 135–145)
TCO2: 20 mmol/L (ref 0–100)
pCO2 arterial: 37.8 mmHg (ref 32.0–48.0)
pH, Arterial: 7.302 — ABNORMAL LOW (ref 7.350–7.450)
pO2, Arterial: 95 mmHg (ref 83.0–108.0)

## 2016-12-10 LAB — CBC
HEMATOCRIT: 38.5 % (ref 36.0–46.0)
Hemoglobin: 12.4 g/dL (ref 12.0–15.0)
MCH: 30.5 pg (ref 26.0–34.0)
MCHC: 32.2 g/dL (ref 30.0–36.0)
MCV: 94.8 fL (ref 78.0–100.0)
PLATELETS: 216 10*3/uL (ref 150–400)
RBC: 4.06 MIL/uL (ref 3.87–5.11)
RDW: 13.4 % (ref 11.5–15.5)
WBC: 2 10*3/uL — AB (ref 4.0–10.5)

## 2016-12-10 LAB — URINE CULTURE: Culture: NO GROWTH

## 2016-12-10 LAB — LACTIC ACID, PLASMA: LACTIC ACID, VENOUS: 5.3 mmol/L — AB (ref 0.5–1.9)

## 2016-12-10 LAB — MAGNESIUM: MAGNESIUM: 2.7 mg/dL — AB (ref 1.7–2.4)

## 2016-12-10 SURGERY — LAPAROTOMY, EXPLORATORY
Anesthesia: General

## 2016-12-10 MED ORDER — 0.9 % SODIUM CHLORIDE (POUR BTL) OPTIME
TOPICAL | Status: DC | PRN
  Administered 2016-12-10: 7000 mL

## 2016-12-10 MED ORDER — MIDAZOLAM HCL 2 MG/2ML IJ SOLN
INTRAMUSCULAR | Status: DC | PRN
  Administered 2016-12-10 (×4): 1 mg via INTRAVENOUS

## 2016-12-10 MED ORDER — CHLORHEXIDINE GLUCONATE CLOTH 2 % EX PADS: 6.0000 | MEDICATED_PAD | Freq: Once | CUTANEOUS | Status: DC

## 2016-12-10 MED ORDER — NOREPINEPHRINE BITARTRATE 1 MG/ML IV SOLN
0.0000 ug/min | INTRAVENOUS | Status: DC
  Administered 2016-12-10: 22 ug/min via INTRAVENOUS
  Administered 2016-12-12: 7 ug/min via INTRAVENOUS
  Filled 2016-12-10 (×3): qty 16

## 2016-12-10 MED ORDER — MIDAZOLAM HCL 2 MG/2ML IJ SOLN: 1.0000 mg | INTRAMUSCULAR | Status: DC | PRN

## 2016-12-10 MED ORDER — ROCURONIUM BROMIDE 100 MG/10ML IV SOLN
INTRAVENOUS | Status: DC | PRN
  Administered 2016-12-10: 20 mg via INTRAVENOUS
  Administered 2016-12-10: 40 mg via INTRAVENOUS
  Administered 2016-12-10: 20 mg via INTRAVENOUS

## 2016-12-10 MED ORDER — PROPOFOL 10 MG/ML IV BOLUS
INTRAVENOUS | Status: AC
  Filled 2016-12-10: qty 20

## 2016-12-10 MED ORDER — SODIUM CHLORIDE 0.9 % IV SOLN
250.0000 mL | INTRAVENOUS | Status: DC
  Administered 2016-12-10 (×2): 1000 mL via INTRAVENOUS

## 2016-12-10 MED ORDER — MIDAZOLAM HCL 2 MG/2ML IJ SOLN
INTRAMUSCULAR | Status: AC
  Filled 2016-12-10: qty 2

## 2016-12-10 MED ORDER — CHLORHEXIDINE GLUCONATE CLOTH 2 % EX PADS
6.0000 | MEDICATED_PAD | Freq: Once | CUTANEOUS | Status: AC
  Administered 2016-12-10: 6 via TOPICAL

## 2016-12-10 MED ORDER — PHENYLEPHRINE HCL 10 MG/ML IJ SOLN
INTRAMUSCULAR | Status: DC | PRN
  Administered 2016-12-10: 80 ug via INTRAVENOUS
  Administered 2016-12-10: 40 ug via INTRAVENOUS
  Administered 2016-12-10 (×2): 80 ug via INTRAVENOUS

## 2016-12-10 MED ORDER — SODIUM CHLORIDE 0.9 % IV SOLN
Freq: Once | INTRAVENOUS | Status: AC
  Administered 2016-12-10: 01:00:00 via INTRAVENOUS

## 2016-12-10 MED ORDER — DEXTROSE-NACL 5-0.45 % IV SOLN
INTRAVENOUS | Status: DC
  Administered 2016-12-10: 06:00:00 via INTRAVENOUS

## 2016-12-10 MED ORDER — ACETAMINOPHEN 325 MG PO TABS: 650.0000 mg | ORAL_TABLET | Freq: Four times a day (QID) | ORAL | Status: DC | PRN

## 2016-12-10 MED ORDER — LACTATED RINGERS IV SOLN
INTRAVENOUS | Status: DC | PRN
  Administered 2016-12-10 (×2): via INTRAVENOUS

## 2016-12-10 MED ORDER — ALBUMIN HUMAN 5 % IV SOLN
INTRAVENOUS | Status: DC | PRN
  Administered 2016-12-10 (×2): via INTRAVENOUS

## 2016-12-10 MED ORDER — VANCOMYCIN HCL IN DEXTROSE 750-5 MG/150ML-% IV SOLN
750.0000 mg | INTRAVENOUS | Status: DC
  Administered 2016-12-10: 750 mg via INTRAVENOUS
  Filled 2016-12-10: qty 150

## 2016-12-10 MED ORDER — ACETAMINOPHEN 650 MG RE SUPP
650.0000 mg | Freq: Four times a day (QID) | RECTAL | Status: DC | PRN
  Administered 2016-12-10: 650 mg via RECTAL
  Filled 2016-12-10: qty 1

## 2016-12-10 MED ORDER — FENTANYL CITRATE (PF) 250 MCG/5ML IJ SOLN
INTRAMUSCULAR | Status: AC
  Filled 2016-12-10: qty 5

## 2016-12-10 MED ORDER — MIDAZOLAM HCL 2 MG/2ML IJ SOLN
2.0000 mg | Freq: Once | INTRAMUSCULAR | Status: AC
  Administered 2016-12-10: 2 mg via INTRAVENOUS

## 2016-12-10 SURGICAL SUPPLY — 44 items
BLADE CLIPPER SURG (BLADE) ×2 IMPLANT
CANISTER SUCT 3000ML PPV (MISCELLANEOUS) ×7 IMPLANT
CHLORAPREP W/TINT 26ML (MISCELLANEOUS) ×3 IMPLANT
COVER SURGICAL LIGHT HANDLE (MISCELLANEOUS) ×3 IMPLANT
DRAPE LAPAROSCOPIC ABDOMINAL (DRAPES) ×3 IMPLANT
DRAPE WARM FLUID 44X44 (DRAPE) ×3 IMPLANT
DRSG OPSITE POSTOP 4X10 (GAUZE/BANDAGES/DRESSINGS) IMPLANT
DRSG OPSITE POSTOP 4X8 (GAUZE/BANDAGES/DRESSINGS) IMPLANT
DRSG VAC ATS LRG SENSATRAC (GAUZE/BANDAGES/DRESSINGS) ×2 IMPLANT
ELECT BLADE 6.5 EXT (BLADE) IMPLANT
ELECT CAUTERY BLADE 6.4 (BLADE) ×3 IMPLANT
ELECT REM PT RETURN 9FT ADLT (ELECTROSURGICAL) ×3
ELECTRODE REM PT RTRN 9FT ADLT (ELECTROSURGICAL) ×1 IMPLANT
GLOVE BIO SURGEON STRL SZ8 (GLOVE) ×3 IMPLANT
GLOVE BIOGEL PI IND STRL 8 (GLOVE) ×1 IMPLANT
GLOVE BIOGEL PI INDICATOR 8 (GLOVE) ×2
GOWN STRL REUS W/ TWL LRG LVL3 (GOWN DISPOSABLE) ×1 IMPLANT
GOWN STRL REUS W/ TWL XL LVL3 (GOWN DISPOSABLE) ×1 IMPLANT
GOWN STRL REUS W/TWL LRG LVL3 (GOWN DISPOSABLE) ×3
GOWN STRL REUS W/TWL XL LVL3 (GOWN DISPOSABLE) ×3
KIT BASIN OR (CUSTOM PROCEDURE TRAY) ×3 IMPLANT
KIT OSTOMY DRAINABLE 2.75 STR (WOUND CARE) ×2 IMPLANT
KIT ROOM TURNOVER OR (KITS) ×3 IMPLANT
LIGASURE IMPACT 36 18CM CVD LR (INSTRUMENTS) IMPLANT
NS IRRIG 1000ML POUR BTL (IV SOLUTION) ×18 IMPLANT
PACK GENERAL/GYN (CUSTOM PROCEDURE TRAY) ×3 IMPLANT
PAD ARMBOARD 7.5X6 YLW CONV (MISCELLANEOUS) ×1 IMPLANT
PAD NEG PRESSURE SENSATRAC (MISCELLANEOUS) ×2 IMPLANT
RELOAD PROXIMATE 75MM BLUE (ENDOMECHANICALS) ×3 IMPLANT
RELOAD STAPLE 75 3.8 BLU REG (ENDOMECHANICALS) IMPLANT
SPECIMEN JAR LARGE (MISCELLANEOUS) IMPLANT
SPONGE LAP 18X18 X RAY DECT (DISPOSABLE) ×4 IMPLANT
STAPLER PROXIMATE 75MM BLUE (STAPLE) ×2 IMPLANT
STAPLER VISISTAT 35W (STAPLE) ×3 IMPLANT
SUCTION POOLE TIP (SUCTIONS) ×3 IMPLANT
SUT PDS AB 1 TP1 96 (SUTURE) ×6 IMPLANT
SUT VIC AB 2-0 SH 18 (SUTURE) ×1 IMPLANT
SUT VIC AB 3-0 SH 18 (SUTURE) ×5 IMPLANT
SUT VICRYL AB 2 0 TIES (SUTURE) ×3 IMPLANT
SUT VICRYL AB 3 0 TIES (SUTURE) ×3 IMPLANT
TOWEL OR 17X24 6PK STRL BLUE (TOWEL DISPOSABLE) ×3 IMPLANT
TOWEL OR 17X26 10 PK STRL BLUE (TOWEL DISPOSABLE) ×3 IMPLANT
TRAY FOLEY W/METER SILVER 16FR (SET/KITS/TRAYS/PACK) IMPLANT
YANKAUER SUCT BULB TIP NO VENT (SUCTIONS) IMPLANT

## 2016-12-10 NOTE — Transfer of Care (Signed)
Immediate Anesthesia Transfer of Care Note  Patient: Melissa Oconnell  Procedure(s) Performed: Procedure(s): EXPLORATORY LAPAROTOMY WITH SIGMOID COLECTOMY ANT HARTMAN'S POUCH (N/A)  Patient Location: ICU  Anesthesia Type:General  Level of Consciousness: Patient remains intubated per anesthesia plan  Airway & Oxygen Therapy: Patient remains intubated per anesthesia plan and Patient placed on Ventilator (see vital sign flow sheet for setting)  Post-op Assessment: Report given to RN and Post -op Vital signs reviewed and stable  Post vital signs: Reviewed and stable  Last Vitals:  Vitals:   12/10/16 1226 12/10/16 1230  BP:  93/77  Pulse:    Resp:  20  Temp: 37.1 C     Last Pain:  Vitals:   12/10/16 1226  TempSrc: Oral  PainSc:          Complications: No apparent anesthesia complications

## 2016-12-10 NOTE — Progress Notes (Signed)
PCCM Progress Note  Admission date: 12/09/2016 Referring provider: Dr. Zigmund Daniel, ER  CC: Lt side weakness  HPI: 61 yo female presented with Lt side weakness, dysarthria.  She has hx of MS.  Initial concern for code stroke.  Noted to have hypotension from sepsis.  Had abdominal pain and CT abd/pelvis concerning for free air.  Subjective: C/o abdominal pain. Remains on pressors.  Vital signs: BP 98/85   Pulse (!) 125   Temp 98.1 F (36.7 C) (Oral)   Resp (!) 25   Ht 5\' 8"  (1.727 m)   Wt 117 lb 4.6 oz (53.2 kg)   SpO2 100%   BMI 17.83 kg/m   Intake/output: I/O last 3 completed shifts: In: 5688.6 [I.V.:4238.6; IV Piggyback:1450] Out: 387 [Urine:745; Emesis/NG output:250]  General: alert Neuro: follows commands HEENT: ETT in place Cardiac: regular, tachycardic Chest: no wheeze Abd: distended, tender with minimal palpation Ext: no edema Skin: no rashes   CMP Latest Ref Rng & Units 12/10/2016 12/09/2016 12/09/2016  Glucose 65 - 99 mg/dL 101(H) - 168(H)  BUN 6 - 20 mg/dL 19 - 15  Creatinine 0.44 - 1.00 mg/dL 1.34(H) 1.45(H) 1.30(H)  Sodium 135 - 145 mmol/L 131(L) - 131(L)  Potassium 3.5 - 5.1 mmol/L 3.8 - 3.1(L)  Chloride 101 - 111 mmol/L 101 - 95(L)  CO2 22 - 32 mmol/L 18(L) - -  Calcium 8.9 - 10.3 mg/dL 8.5(L) - -  Total Protein 6.5 - 8.1 g/dL - - -  Total Bilirubin 0.3 - 1.2 mg/dL - - -  Alkaline Phos 38 - 126 U/L - - -  AST 15 - 41 U/L - - -  ALT 14 - 54 U/L - - -     CBC Latest Ref Rng & Units 12/10/2016 12/09/2016 12/09/2016  WBC 4.0 - 10.5 K/uL 2.0(L) 1.6(L) -  Hemoglobin 12.0 - 15.0 g/dL 12.4 12.6 12.6  Hematocrit 36.0 - 46.0 % 38.5 38.7 39.4  Platelets 150 - 400 K/uL 216 224 -     ABG    Component Value Date/Time   PHART 7.352 12/10/2016 0510   PCO2ART 30.9 (L) 12/10/2016 0510   PO2ART 88.8 12/10/2016 0510   HCO3 16.7 (L) 12/10/2016 0510   TCO2 20 12/09/2016 1449   ACIDBASEDEF 7.8 (H) 12/10/2016 0510   O2SAT 96.2 12/10/2016 0510     CBG (last 3)    Recent Labs  12/09/16 2332 12/10/16 0353 12/10/16 0813  GLUCAP 107* 106* 128*     Imaging: Ct Abdomen Pelvis Wo Contrast  Result Date: 12/10/2016 CLINICAL DATA:  Abdominal guarding. Coffee-ground emesis. History of breast cancer and multiple sclerosis. Code stroke. EXAM: CT ABDOMEN AND PELVIS WITHOUT CONTRAST TECHNIQUE: Multidetector CT imaging of the abdomen and pelvis was performed following the standard protocol without IV contrast. COMPARISON:  02/07/2009 FINDINGS: Lower chest: Small bilateral pleural effusions with basilar atelectasis. Emphysematous changes in the lung bases. An enteric tube is present with tip in the upper stomach. Hepatobiliary: Increased density in the gallbladder suggesting sludge. No focal liver lesions. Pancreas: Unremarkable. No pancreatic ductal dilatation or surrounding inflammatory changes. Spleen: Normal in size without focal abnormality. Adrenals/Urinary Tract: There is residual contrast material in the renal collecting systems and bladder. Bladder is decompressed with a Foley catheter. No hydronephrosis or hydroureter. Kidneys are symmetrical in size. Stomach/Bowel: Diffuse small bowel wall thickening with mild distention of fluid-filled loops. Colon is mostly decompressed with stool in the right side and transverse regions. Mild colonic wall thickening. Bowel and colonic wall thickening may be  related to ascites or could represent an enterocolitis. Infectious or inflammatory etiologies may be considered. Bowel ischemia not excluded. No pneumatosis. No mesenteric atherosclerosis. Appendix is not identified. Vascular/Lymphatic: No significant vascular findings are present. No enlarged abdominal or pelvic lymph nodes. Reproductive: Uterus and bilateral adnexa are unremarkable. Other: Moderate diffuse abdominal and pelvic ascites. There few small bubbles of gas that are not definitively within bowel and suggest a small amount of free intra-abdominal air. In the absence  of recent surgery, this appearance would be worrisome for bowel perforation. Abdominal wall musculature appears intact. Musculoskeletal: No destructive bone lesions. IMPRESSION: Mildly dilated fluid-filled small bowel with diffuse small bowel and colonic wall thickening. Diffuse abdominal ascites. Possible small amount of free air. Bowel changes could be due to ascites but bowel ischemia, infectious or inflammatory enterocolitis could also be considered. The presence of small amount of free air is worrisome for bowel perforation. Laboratory correlation is suggested. Small bilateral pleural effusions with basilar atelectasis. Emphysematous changes in the lung bases. Probable gallbladder sludge. These results were called by telephone at the time of interpretation on 12/10/2016 at 2:40 am to Dr. Corrie Dandy, who verbally acknowledged these results. Electronically Signed   By: Lucienne Capers M.D.   On: 12/10/2016 02:53   Ct Angio Head W Or Wo Contrast  Result Date: 12/09/2016 CLINICAL DATA:  LEFT-sided deficit, subsequently reversed with improvement in hypotension. History of multiple sclerosis. History of breast cancer. EXAM: CT ANGIOGRAPHY HEAD AND NECK TECHNIQUE: Multidetector CT imaging of the head and neck was performed using the standard protocol during bolus administration of intravenous contrast. Multiplanar CT image reconstructions and MIPs were obtained to evaluate the vascular anatomy. Carotid stenosis measurements (when applicable) are obtained utilizing NASCET criteria, using the distal internal carotid diameter as the denominator. CONTRAST:  CONTRAST 50 mL Isovue 370. COMPARISON:  CT head earlier today. FINDINGS: CTA NECK Aortic arch: Standard branching. Imaged portion shows no evidence of aneurysm or dissection. No significant stenosis of the major arch vessel origins. Right carotid system: No significant atheromatous disease. No evidence of dissection, stenosis (50% or greater) or occlusion. Left  carotid system: No evidence of dissection, stenosis (50% or greater) or occlusion. Vertebral arteries: Codominant. Both are diminutive reflecting basilar hypoplasia. No evidence of dissection, stenosis (50% or greater) or occlusion. Nonvascular soft tissues: Thyromegaly, question multinodular goiter. No neck masses. Endotracheal tube and orogastric tube good position. COPD. RIGHT pleural effusion. CTA HEAD Anterior circulation: The internal carotid arteries, anterior cerebral arteries, and middle cerebral arteries are widely patent. No significant stenosis, proximal occlusion, aneurysm, or vascular malformation. Posterior circulation: Diminutive. Fetal origin BILATERAL PCA. Both vertebrals contribute to basilar formation. No significant stenosis, proximal occlusion, aneurysm, or vascular malformation. Venous sinuses: As permitted by contrast timing, patent. Anatomic variants: BILATERAL fetal PCA. Delayed phase:   No abnormal intracranial enhancement. IMPRESSION: No intracranial or extracranial stenosis of significance. 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 COPD, with RIGHT pleural effusion. Electronically Signed   By: Staci Righter M.D.   On: 12/09/2016 16:01   Ct Angio Neck W Or Wo Contrast  Result Date: 12/09/2016 CLINICAL DATA:  LEFT-sided deficit, subsequently reversed with improvement in hypotension. History of multiple sclerosis. History of breast cancer. EXAM: CT ANGIOGRAPHY HEAD AND NECK TECHNIQUE: Multidetector CT imaging of the head and neck was performed using the standard protocol during bolus administration of intravenous contrast. Multiplanar CT image reconstructions and MIPs were obtained to evaluate the vascular anatomy. Carotid stenosis measurements (when applicable) are obtained utilizing  NASCET criteria, using the distal internal carotid diameter as the denominator. CONTRAST:  CONTRAST 50 mL Isovue 370. COMPARISON:  CT head earlier today. FINDINGS: CTA NECK Aortic arch:  Standard branching. Imaged portion shows no evidence of aneurysm or dissection. No significant stenosis of the major arch vessel origins. Right carotid system: No significant atheromatous disease. No evidence of dissection, stenosis (50% or greater) or occlusion. Left carotid system: No evidence of dissection, stenosis (50% or greater) or occlusion. Vertebral arteries: Codominant. Both are diminutive reflecting basilar hypoplasia. No evidence of dissection, stenosis (50% or greater) or occlusion. Nonvascular soft tissues: Thyromegaly, question multinodular goiter. No neck masses. Endotracheal tube and orogastric tube good position. COPD. RIGHT pleural effusion. CTA HEAD Anterior circulation: The internal carotid arteries, anterior cerebral arteries, and middle cerebral arteries are widely patent. No significant stenosis, proximal occlusion, aneurysm, or vascular malformation. Posterior circulation: Diminutive. Fetal origin BILATERAL PCA. Both vertebrals contribute to basilar formation. No significant stenosis, proximal occlusion, aneurysm, or vascular malformation. Venous sinuses: As permitted by contrast timing, patent. Anatomic variants: BILATERAL fetal PCA. Delayed phase:   No abnormal intracranial enhancement. IMPRESSION: No intracranial or extracranial stenosis of significance. 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 COPD, with RIGHT pleural effusion. Electronically Signed   By: Staci Righter M.D.   On: 12/09/2016 16:01   Mr Brain Wo Contrast  Result Date: 12/10/2016 CLINICAL DATA:  Altered mental status, LEFT-sided deficits. Follow-up code stroke. History of breast cancer and multiple sclerosis. EXAM: MRI HEAD WITHOUT CONTRAST TECHNIQUE: Multiplanar, multiecho pulse sequences of the brain and surrounding structures were obtained without intravenous contrast. COMPARISON:  CT HEAD Dec 09, 2016 and MRI of the head February 15, 2015 FINDINGS: Moderately motion degraded examination. BRAIN: No  reduced diffusion to suggest acute ischemia. No susceptibility artifact to suggest hemorrhage. Mild global parenchymal brain volume loss for age, disproportionate vermian atrophy, unchanged. Confluent supratentorial white matter T2 hyperintensities radiating from the periventricular margin stable from prior MRI. Stable RIGHT cerebellar and LEFT pontine lesions. RIGHT and possibly LEFT thalamus stable T2 hyperintensities. Lesions demonstrate low T1 signal most compatible with black holes of demyelination. No midline shift, mass effect or masses. No abnormal extra-axial fluid collections. VASCULAR: Normal major intracranial vascular flow voids present at skull base. SKULL AND UPPER CERVICAL SPINE: No abnormal sellar expansion. No suspicious calvarial bone marrow signal. Craniocervical junction maintained. SINUSES/ORBITS: The mastoid air-cells and included paranasal sinuses are well-aerated. The included ocular globes and orbital contents are non-suspicious. OTHER: Patient is edentulous. IMPRESSION: No acute intracranial process on this motion degraded examination. Stable examination including severe chronic demyelination and mild parenchymal brain volume loss for age, disproportionately affecting the vermis. Electronically Signed   By: Elon Alas M.D.   On: 12/10/2016 03:45   Dg Chest Port 1 View  Result Date: 12/10/2016 CLINICAL DATA:  Status post intubation. EXAM: PORTABLE CHEST 1 VIEW COMPARISON:  Radiograph of Dec 09, 2016. FINDINGS: Stable cardiomediastinal silhouette. Endotracheal and nasogastric tubes are unchanged in position. No pneumothorax is noted. Mild bibasilar atelectasis or infiltrates are noted with possible associated minimal pleural effusions. Bony thorax is unremarkable. IMPRESSION: Mild bibasilar atelectasis or infiltrates are noted with possible associated minimal pleural effusions. Stable support apparatus. Electronically Signed   By: Marijo Conception, M.D.   On: 12/10/2016 08:19    Dg Chest Portable 1 View  Result Date: 12/09/2016 CLINICAL DATA:  Intubation. EXAM: PORTABLE CHEST 1 VIEW COMPARISON:  Nov 26, 2013 FINDINGS: An NG tube terminates below today's film. An ET tube  is in good position terminating in the mid trachea. No pneumothorax. The cardiomediastinal silhouette is stable. The left base is poorly evaluated. No acute abnormalities are seen. IMPRESSION: The ETT is in good position. The NG tube terminates below today's film. No acute abnormalities otherwise seen. Electronically Signed   By: Dorise Bullion III M.D   On: 12/09/2016 14:49   Ct Head Code Stroke W/o Cm  Result Date: 12/09/2016 CLINICAL DATA:  Code stroke. LEFT-sided deficit. History of multiple sclerosis. EXAM: CT HEAD WITHOUT CONTRAST TECHNIQUE: Contiguous axial images were obtained from the base of the skull through the vertex without intravenous contrast. COMPARISON:  MR brain 02/15/2015. FINDINGS: Brain: No evidence for acute infarction, hemorrhage, mass lesion, hydrocephalus, or extra-axial fluid. Moderate cerebral and cerebellar atrophy, premature for age. Extensive white matter signal abnormality, consistent with the diagnosis of multiple sclerosis. No definite progression from priors. Vascular: No hyperdense vessel or unexpected calcification. Skull: Normal. Negative for fracture or focal lesion. Sinuses/Orbits: No acute finding. Other: None. ASPECTS [facility] Stroke Program Early CT Score) - Ganglionic level infarction (caudate, lentiform nuclei, internal capsule, insula, M1-M3 cortex): 7 - Supraganglionic infarction (M4-M6 cortex): 3 Total score (0-10 with 10 being normal): 10 IMPRESSION: 1. Premature for age atrophy. Extensive white matter disease, correlating with the history of multiple sclerosis. No definite acute stroke. No proximal large vessel occlusion. CTA pending. 2. ASPECTS is 10. A text message with these findings was sent to the stroke neurologist at 3:25 p.m. Electronically Signed   By:  Staci Righter M.D.   On: 12/09/2016 15:27     Studies: CT abd/pelvis 5/27 >> small b/l effusions, emphysema, GB sludge, diffuse SB thickening, mod ascites, small amount of free air MRI brain 5/27 >> no acute process, severe chronic demyelination  Antibiotics: Vancomycin 5/26 >> Zosyn 5/26 >>  Cultures: Blood 5/26 >> Sputum 5/26 >> Urine 5/26 >> GI panel 5/26 >>  Lines/tubes: ETT 5/26 >>  Events: 5/26 Admit, neuro consulted 5/27 Neuro s/o, surgery consulted  Summary: 61 yo female with altered mental status from septic shock with concern for acute abdomen.  PMhx of dementia, MS and felt not to be candidate for immunomodulation therapy by neurology at last visit on 11/16/16.  Assessment/plan:  Acute respiratory failure hypoxia in setting of septic shock. - continue vent support - f/u CXR  Septic shock with lactic acidosis with concern for acute abdomen. - surgery consulted - continue Abx - pressors to keep MAP > 65 - continue IV fluids - NG tube to suction  Acute renal failure >> baseline creatinine 1 from 04/05/16. - continue IV fluids - f/u BMET  Leukopenia in setting of sepsis. - f/u CBC  Acute metabolic encephalopathy. Hx of dementia, MS. - RASS goal 0  Hyperglycemia. - SSI  DVT prophylaxis - SCDs/SQ heparin SUP - Protonix Nutrition - NPO Goals of care - full code   CC time 38 minutes  Chesley Mires, MD [place] 12/10/2016, 9:54 AM Pager:  [phone] After 3pm call: [phone]

## 2016-12-10 NOTE — Procedures (Signed)
Pt scheduled for OR, placed back on full support at this time.

## 2016-12-10 NOTE — Progress Notes (Signed)
No acute findings on MRI, suspect presentation was all due to hypotension. Please call with any further neurological concerns.   Roland Rack, MD [facility] [phone]  If 7pm- 7am, please page neurology on call as listed in [place].

## 2016-12-10 NOTE — Consult Note (Signed)
Reason for Consult: Free intraabdominal air Referring Physician: Chesley Mires, MD CC:  Altered Mental status  Melissa Oconnell is an 61 y.o. female.  HPI: Pt brought to the Ed when family found her with altered mental status, right sided weakness, Code stroke called in the field.  Pt was in respiratory distress, nonverbal and follow commands intermittently.  She was intubated and had a dense left heiplegia with right gaze, hypotensive, on Levophed in the ED on admit by CCM with consult by Neurology.     She was admitted by CCM. MRI showed no acute findings, and it was neurology's opinion the neuro changes were related to her hypotension.  Pt was  found to have a tender abdomen on exam, fever up to 102.9, during the evening.   Ct of the Abdomen was obtained early this AM.  Pt remains profoundly ill, and on pressors.  CT scan shows:  Diffuse small bowel wall thickening with mild distention of fluid-filled loops. Colon is mostly decompressed with stool in the right side and transverse regions. Mild colonic wall thickening. Bowel and colonic wall thickening may be related to ascites or could represent an enterocolitis. Infectious or inflammatory etiologies may be considered. Bowel ischemia not excluded. No pneumatosis. No mesenteric atherosclerosis. Appendix is not identified.    We were called this AM by Dr. Halford Chessman to evaluate for peritonitis. As noted above she remains on the ventilator; she is hypotensive on levophed, she feels febrile, and she has an exquisitely tender abdomen on exam.  She will easily respond to any palpation of her abdomen.    TM 102.9, BP in the 90's on Levophed, tachycardic up to the 130's currently.  Metabolic acidosis on ABG, Na 131, Hypokalemia better, creatinine is up to 1.3 range, low protein and albumin on CMP.  Past Medical History:  Diagnosis Date  . Abnormality of gait   . Cancer ([place])    breast  . Memory loss   . Multiple sclerosis ([place])   . Neuromuscular disorder  ([place])    multiple sclerosis  . Osteoporosis, unspecified 01/06/2014    Past Surgical History:  Procedure Laterality Date  . DIAGNOSTIC LAPAROSCOPY     "swallowed something"  . MASTECTOMY Right   . MASTECTOMY W/ SENTINEL NODE BIOPSY Right 12/02/2013   Procedure: TOTAL MASTECTOMY WITH SENTINEL LYMPH NODE BIOPSY;  Surgeon: Adin Hector, MD;  Location: [facility];  Service: General;  Laterality: Right;  . TONSILLECTOMY      Family History  Problem Relation Age of Onset  . Cancer Mother        breast    Social History:  reports that she has quit smoking. Her smoking use included Cigarettes. She has a 5.00 pack-year smoking history. She has never used smokeless tobacco. She reports that she drinks alcohol. She reports that she uses drugs, including Cocaine.  Allergies: No Known Allergies  Medications:  Prior to Admission:  Prescriptions Prior to Admission  Medication Sig Dispense Refill Last Dose  . acetaminophen (TYLENOL) 325 MG tablet Take 325 mg by mouth every 6 (six) hours as needed for mild pain or moderate pain. Reported on 10/04/2015   month ago  . Calcium Carb-Cholecalciferol (CALCIUM 600 + D) 600-200 MG-UNIT TABS Take 1 tablet by mouth at bedtime.    12/08/2016 at pm  . docusate sodium (COLACE) 100 MG capsule Take 100 mg by mouth 3 (three) times daily as needed (constipation).   12/08/2016 at Unknown time  . donepezil (ARICEPT) 10 MG tablet  Take 1 tablet (10 mg total) by mouth at bedtime. 30 tablet 8 12/08/2016 at pm  . ENSURE (ENSURE) Take 237 mLs by mouth 3 (three) times daily.    12/07/2016  . polyethylene glycol (MIRALAX / GLYCOLAX) packet Take 17 g by mouth daily as needed (constipation). Mix in 8 oz liquid and drink   12/08/2016 at pm   Scheduled: . chlorhexidine gluconate (MEDLINE KIT)  15 mL Mouth Rinse BID  . heparin  5,000 Units Subcutaneous Q8H  . insulin aspart  2-6 Units Subcutaneous Q4H  . mouth rinse  15 mL Mouth Rinse QID  . pantoprazole (PROTONIX) IV  40 mg  Intravenous Q12H   Continuous: . sodium chloride 1,000 mL (12/10/16 1051)  . norepinephrine (LEVOPHED) Adult infusion 19 mcg/min (12/10/16 1010)  . piperacillin-tazobactam (ZOSYN)  IV 3.375 g (12/10/16 0742)  . vancomycin     Anti-infectives    Start     Dose/Rate Route Frequency Ordered Stop   12/10/16 1700  vancomycin (VANCOCIN) IVPB 750 mg/150 ml premix  Status:  Discontinued     750 mg 150 mL/hr over 60 Minutes Intravenous Every 12 hours 12/09/16 1532 12/10/16 1025   12/10/16 1700  vancomycin (VANCOCIN) IVPB 750 mg/150 ml premix     750 mg 150 mL/hr over 60 Minutes Intravenous Every 24 hours 12/10/16 1025     12/10/16 0000  piperacillin-tazobactam (ZOSYN) IVPB 3.375 g     3.375 g 12.5 mL/hr over 240 Minutes Intravenous Every 8 hours 12/09/16 1532     12/09/16 1530  piperacillin-tazobactam (ZOSYN) IVPB 3.375 g     3.375 g 100 mL/hr over 30 Minutes Intravenous  Once 12/09/16 1520 12/09/16 1659   12/09/16 1530  vancomycin (VANCOCIN) IVPB 1000 mg/200 mL premix     1,000 mg 200 mL/hr over 60 Minutes Intravenous  Once 12/09/16 1520 12/09/16 1801      Results for orders placed or performed during the hospital encounter of 12/09/16 (from the past 48 hour(s))  Protime-INR     Status: Abnormal   Collection Time: 12/09/16  2:15 PM  Result Value Ref Range   Prothrombin Time 15.7 (H) 11.4 - 15.2 seconds   INR 1.24   APTT     Status: None   Collection Time: 12/09/16  2:15 PM  Result Value Ref Range   aPTT 26 24 - 36 seconds  CBC     Status: None   Collection Time: 12/09/16  2:15 PM  Result Value Ref Range   WBC 4.7 4.0 - 10.5 K/uL   RBC 4.10 3.87 - 5.11 MIL/uL   Hemoglobin 12.6 12.0 - 15.0 g/dL   HCT 39.0 36.0 - 46.0 %   MCV 95.1 78.0 - 100.0 fL   MCH 30.7 26.0 - 34.0 pg   MCHC 32.3 30.0 - 36.0 g/dL   RDW 13.2 11.5 - 15.5 %   Platelets 180 150 - 400 K/uL  Differential     Status: None   Collection Time: 12/09/16  2:15 PM  Result Value Ref Range   Neutrophils Relative % 80 %    Lymphocytes Relative 16 %   Monocytes Relative 3 %   Eosinophils Relative 0 %   Basophils Relative 1 %   Neutro Abs 3.8 1.7 - 7.7 K/uL   Lymphs Abs 0.8 0.7 - 4.0 K/uL   Monocytes Absolute 0.1 0.1 - 1.0 K/uL   Eosinophils Absolute 0.0 0.0 - 0.7 K/uL   Basophils Absolute 0.0 0.0 - 0.1 K/uL   WBC Morphology  INCREASED BANDS (>20% BANDS)     Comment: MILD LEFT SHIFT (1-5% METAS, OCC MYELO, OCC BANDS)  Comprehensive metabolic panel     Status: Abnormal   Collection Time: 12/09/16  2:15 PM  Result Value Ref Range   Sodium 130 (L) 135 - 145 mmol/L   Potassium 3.2 (L) 3.5 - 5.1 mmol/L   Chloride 96 (L) 101 - 111 mmol/L   CO2 21 (L) 22 - 32 mmol/L   Glucose, Bld 174 (H) 65 - 99 mg/dL   BUN 15 6 - 20 mg/dL   Creatinine, Ser 6.38 (H) 0.44 - 1.00 mg/dL   Calcium 9.3 8.9 - 45.3 mg/dL   Total Protein 6.0 (L) 6.5 - 8.1 g/dL   Albumin 2.9 (L) 3.5 - 5.0 g/dL   AST 36 15 - 41 U/L   ALT 11 (L) 14 - 54 U/L   Alkaline Phosphatase 60 38 - 126 U/L   Total Bilirubin 0.7 0.3 - 1.2 mg/dL   GFR calc non Af Amer 37 (L) >60 mL/min   GFR calc Af Amer 43 (L) >60 mL/min    Comment: (NOTE) The eGFR has been calculated using the CKD EPI equation. This calculation has not been validated in all clinical situations. eGFR's persistently <60 mL/min signify possible Chronic Kidney Disease.    Anion gap 13 5 - 15  Triglycerides     Status: None   Collection Time: 12/09/16  2:15 PM  Result Value Ref Range   Triglycerides 67 <150 mg/dL  I-stat troponin, ED     Status: None   Collection Time: 12/09/16  2:19 PM  Result Value Ref Range   Troponin i, poc 0.00 0.00 - 0.08 ng/mL   Comment 3            Comment: Due to the release kinetics of cTnI, a negative result within the first hours of the onset of symptoms does not rule out myocardial infarction with certainty. If myocardial infarction is still suspected, repeat the test at appropriate intervals.   I-stat chem 8, ed     Status: Abnormal   Collection  Time: 12/09/16  2:20 PM  Result Value Ref Range   Sodium 131 (L) 135 - 145 mmol/L   Potassium 3.1 (L) 3.5 - 5.1 mmol/L   Chloride 95 (L) 101 - 111 mmol/L   BUN 15 6 - 20 mg/dL   Creatinine, Ser 6.46 (H) 0.44 - 1.00 mg/dL   Glucose, Bld 803 (H) 65 - 99 mg/dL   Calcium, Ion 2.12 (L) 1.15 - 1.40 mmol/L   TCO2 21 0 - 100 mmol/L   Hemoglobin 13.3 12.0 - 15.0 g/dL   HCT 24.8 25.0 - 03.7 %  I-Stat arterial blood gas, ED     Status: Abnormal   Collection Time: 12/09/16  2:49 PM  Result Value Ref Range   pH, Arterial 7.391 7.350 - 7.450   pCO2 arterial 32.3 32.0 - 48.0 mmHg   pO2, Arterial 323.0 (H) 83.0 - 108.0 mmHg   Bicarbonate 19.5 (L) 20.0 - 28.0 mmol/L   TCO2 20 0 - 100 mmol/L   O2 Saturation 100.0 %   Acid-base deficit 5.0 (H) 0.0 - 2.0 mmol/L   Patient temperature 99.0 F    Collection site RADIAL, ALLEN'S TEST ACCEPTABLE    Drawn by RT    Sample type ARTERIAL   Occult bld gastric/duodenum (cup to lab)     Status: None   Collection Time: 12/09/16  4:00 PM  Result Value Ref Range  Occult Blood, Gastric NEGATIVE NEGATIVE  POC occult blood, ED RN will collect     Status: None   Collection Time: 12/09/16  4:00 PM  Result Value Ref Range   Fecal Occult Bld NEGATIVE NEGATIVE  I-Stat CG4 Lactic Acid, ED  (not at  [facility])     Status: Abnormal   Collection Time: 12/09/16  5:35 PM  Result Value Ref Range   Lactic Acid, Venous 4.97 (HH) 0.5 - 1.9 mmol/L   Comment NOTIFIED PHYSICIAN   CBC     Status: Abnormal   Collection Time: 12/09/16  7:01 PM  Result Value Ref Range   WBC 1.6 (L) 4.0 - 10.5 K/uL   RBC 4.06 3.87 - 5.11 MIL/uL   Hemoglobin 12.6 12.0 - 15.0 g/dL   HCT 38.7 36.0 - 46.0 %   MCV 95.3 78.0 - 100.0 fL   MCH 31.0 26.0 - 34.0 pg   MCHC 32.6 30.0 - 36.0 g/dL   RDW 13.3 11.5 - 15.5 %   Platelets 224 150 - 400 K/uL  Creatinine, serum     Status: Abnormal   Collection Time: 12/09/16  7:01 PM  Result Value Ref Range   Creatinine, Ser 1.45 (H) 0.44 - 1.00 mg/dL   GFR calc  non Af Amer 38 (L) >60 mL/min   GFR calc Af Amer 44 (L) >60 mL/min    Comment: (NOTE) The eGFR has been calculated using the CKD EPI equation. This calculation has not been validated in all clinical situations. eGFR's persistently <60 mL/min signify possible Chronic Kidney Disease.   Procalcitonin     Status: None   Collection Time: 12/09/16  7:01 PM  Result Value Ref Range   Procalcitonin >150.00 ng/mL    Comment:        Interpretation: PCT >= 10 ng/mL: Important systemic inflammatory response, almost exclusively due to severe bacterial sepsis or septic shock. (NOTE)         ICU PCT Algorithm               Non ICU PCT Algorithm    ----------------------------     ------------------------------         PCT < 0.25 ng/mL                 PCT < 0.1 ng/mL     Stopping of antibiotics            Stopping of antibiotics       strongly encouraged.               strongly encouraged.    ----------------------------     ------------------------------       PCT level decrease by               PCT < 0.25 ng/mL       >= 80% from peak PCT       OR PCT 0.25 - 0.5 ng/mL          Stopping of antibiotics                                             encouraged.     Stopping of antibiotics           encouraged.    ----------------------------     ------------------------------       PCT level decrease by  PCT >= 0.25 ng/mL       < 80% from peak PCT        AND PCT >= 0.5 ng/mL             Continuing antibiotics                                              encouraged.       Continuing antibiotics            encouraged.    ----------------------------     ------------------------------     PCT level increase compared          PCT > 0.5 ng/mL         with peak PCT AND          PCT >= 0.5 ng/mL             Escalation of antibiotics                                          strongly encouraged.      Escalation of antibiotics        strongly encouraged.   Hemoglobin and hematocrit, blood      Status: None   Collection Time: 12/09/16  7:01 PM  Result Value Ref Range   Hemoglobin 12.6 12.0 - 15.0 g/dL   HCT 39.4 36.0 - 46.0 %  MRSA PCR Screening     Status: None   Collection Time: 12/09/16  7:03 PM  Result Value Ref Range   MRSA by PCR NEGATIVE NEGATIVE    Comment:        The GeneXpert MRSA Assay (FDA approved for NASAL specimens only), is one component of a comprehensive MRSA colonization surveillance program. It is not intended to diagnose MRSA infection nor to guide or monitor treatment for MRSA infections.   Glucose, capillary     Status: Abnormal   Collection Time: 12/09/16  7:47 PM  Result Value Ref Range   Glucose-Capillary 133 (H) 65 - 99 mg/dL   Comment 1 Capillary Specimen   Lactic acid, plasma     Status: Abnormal   Collection Time: 12/09/16  9:52 PM  Result Value Ref Range   Lactic Acid, Venous 5.8 (HH) 0.5 - 1.9 mmol/L    Comment: CRITICAL RESULT CALLED TO, READ BACK BY AND VERIFIED WITH: TURNER D,RN 12/09/16 2249 WAYK   Glucose, capillary     Status: Abnormal   Collection Time: 12/09/16 11:32 PM  Result Value Ref Range   Glucose-Capillary 107 (H) 65 - 99 mg/dL   Comment 1 Capillary Specimen   Lactic acid, plasma     Status: Abnormal   Collection Time: 12/10/16 12:05 AM  Result Value Ref Range   Lactic Acid, Venous 5.3 (HH) 0.5 - 1.9 mmol/L    Comment: CRITICAL RESULT CALLED TO, READ BACK BY AND VERIFIED WITH: MOTLEY J,RN 12/10/16 0104 WAYK   CBC     Status: Abnormal   Collection Time: 12/10/16  3:04 AM  Result Value Ref Range   WBC 2.0 (L) 4.0 - 10.5 K/uL   RBC 4.06 3.87 - 5.11 MIL/uL   Hemoglobin 12.4 12.0 - 15.0 g/dL   HCT 38.5 36.0 - 46.0 %   MCV 94.8 78.0 -  100.0 fL   MCH 30.5 26.0 - 34.0 pg   MCHC 32.2 30.0 - 36.0 g/dL   RDW 13.4 11.5 - 15.5 %   Platelets 216 150 - 400 K/uL  Basic metabolic panel     Status: Abnormal   Collection Time: 12/10/16  3:04 AM  Result Value Ref Range   Sodium 131 (L) 135 - 145 mmol/L   Potassium 3.8  3.5 - 5.1 mmol/L    Comment: DELTA CHECK NOTED   Chloride 101 101 - 111 mmol/L   CO2 18 (L) 22 - 32 mmol/L   Glucose, Bld 101 (H) 65 - 99 mg/dL   BUN 19 6 - 20 mg/dL   Creatinine, Ser 1.34 (H) 0.44 - 1.00 mg/dL   Calcium 8.5 (L) 8.9 - 10.3 mg/dL   GFR calc non Af Amer 42 (L) >60 mL/min   GFR calc Af Amer 49 (L) >60 mL/min    Comment: (NOTE) The eGFR has been calculated using the CKD EPI equation. This calculation has not been validated in all clinical situations. eGFR's persistently <60 mL/min signify possible Chronic Kidney Disease.    Anion gap 12 5 - 15  Magnesium     Status: Abnormal   Collection Time: 12/10/16  3:04 AM  Result Value Ref Range   Magnesium 2.7 (H) 1.7 - 2.4 mg/dL  Phosphorus     Status: None   Collection Time: 12/10/16  3:04 AM  Result Value Ref Range   Phosphorus 3.0 2.5 - 4.6 mg/dL  Glucose, capillary     Status: Abnormal   Collection Time: 12/10/16  3:53 AM  Result Value Ref Range   Glucose-Capillary 106 (H) 65 - 99 mg/dL   Comment 1 Capillary Specimen   Blood gas, arterial     Status: Abnormal   Collection Time: 12/10/16  5:10 AM  Result Value Ref Range   FIO2 50.00    Delivery systems VENTILATOR    Mode PRESSURE REGULATED VOLUME CONTROL    VT 500 mL   LHR 14 resp/min   Peep/cpap 5.0 cm H20   pH, Arterial 7.352 7.350 - 7.450   pCO2 arterial 30.9 (L) 32.0 - 48.0 mmHg   pO2, Arterial 88.8 83.0 - 108.0 mmHg   Bicarbonate 16.7 (L) 20.0 - 28.0 mmol/L   Acid-base deficit 7.8 (H) 0.0 - 2.0 mmol/L   O2 Saturation 96.2 %   Patient temperature 98.6    Collection site BRACHIAL ARTERY    Drawn by [id number]    Sample type ARTERIAL    Allens test (pass/fail) PASS PASS  Glucose, capillary     Status: Abnormal   Collection Time: 12/10/16  8:13 AM  Result Value Ref Range   Glucose-Capillary 128 (H) 65 - 99 mg/dL   Comment 1 Capillary Specimen    Comment 2 Notify RN     Ct Abdomen Pelvis Wo Contrast  Result Date: 12/10/2016 CLINICAL DATA:  Abdominal  guarding. Coffee-ground emesis. History of breast cancer and multiple sclerosis. Code stroke. EXAM: CT ABDOMEN AND PELVIS WITHOUT CONTRAST TECHNIQUE: Multidetector CT imaging of the abdomen and pelvis was performed following the standard protocol without IV contrast. COMPARISON:  02/07/2009 FINDINGS: Lower chest: Small bilateral pleural effusions with basilar atelectasis. Emphysematous changes in the lung bases. An enteric tube is present with tip in the upper stomach. Hepatobiliary: Increased density in the gallbladder suggesting sludge. No focal liver lesions. Pancreas: Unremarkable. No pancreatic ductal dilatation or surrounding inflammatory changes. Spleen: Normal in size without focal abnormality. Adrenals/Urinary Tract: There  is residual contrast material in the renal collecting systems and bladder. Bladder is decompressed with a Foley catheter. No hydronephrosis or hydroureter. Kidneys are symmetrical in size. Stomach/Bowel: Diffuse small bowel wall thickening with mild distention of fluid-filled loops. Colon is mostly decompressed with stool in the right side and transverse regions. Mild colonic wall thickening. Bowel and colonic wall thickening may be related to ascites or could represent an enterocolitis. Infectious or inflammatory etiologies may be considered. Bowel ischemia not excluded. No pneumatosis. No mesenteric atherosclerosis. Appendix is not identified. Vascular/Lymphatic: No significant vascular findings are present. No enlarged abdominal or pelvic lymph nodes. Reproductive: Uterus and bilateral adnexa are unremarkable. Other: Moderate diffuse abdominal and pelvic ascites. There few small bubbles of gas that are not definitively within bowel and suggest a small amount of free intra-abdominal air. In the absence of recent surgery, this appearance would be worrisome for bowel perforation. Abdominal wall musculature appears intact. Musculoskeletal: No destructive bone lesions. IMPRESSION: Mildly  dilated fluid-filled small bowel with diffuse small bowel and colonic wall thickening. Diffuse abdominal ascites. Possible small amount of free air. Bowel changes could be due to ascites but bowel ischemia, infectious or inflammatory enterocolitis could also be considered. The presence of small amount of free air is worrisome for bowel perforation. Laboratory correlation is suggested. Small bilateral pleural effusions with basilar atelectasis. Emphysematous changes in the lung bases. Probable gallbladder sludge. These results were called by telephone at the time of interpretation on 12/10/2016 at 2:40 am to Dr. Corrie Dandy, who verbally acknowledged these results. Electronically Signed   By: Lucienne Capers M.D.   On: 12/10/2016 02:53   Ct Angio Head W Or Wo Contrast  Result Date: 12/09/2016 CLINICAL DATA:  LEFT-sided deficit, subsequently reversed with improvement in hypotension. History of multiple sclerosis. History of breast cancer. EXAM: CT ANGIOGRAPHY HEAD AND NECK TECHNIQUE: Multidetector CT imaging of the head and neck was performed using the standard protocol during bolus administration of intravenous contrast. Multiplanar CT image reconstructions and MIPs were obtained to evaluate the vascular anatomy. Carotid stenosis measurements (when applicable) are obtained utilizing NASCET criteria, using the distal internal carotid diameter as the denominator. CONTRAST:  CONTRAST 50 mL Isovue 370. COMPARISON:  CT head earlier today. FINDINGS: CTA NECK Aortic arch: Standard branching. Imaged portion shows no evidence of aneurysm or dissection. No significant stenosis of the major arch vessel origins. Right carotid system: No significant atheromatous disease. No evidence of dissection, stenosis (50% or greater) or occlusion. Left carotid system: No evidence of dissection, stenosis (50% or greater) or occlusion. Vertebral arteries: Codominant. Both are diminutive reflecting basilar hypoplasia. No evidence of  dissection, stenosis (50% or greater) or occlusion. Nonvascular soft tissues: Thyromegaly, question multinodular goiter. No neck masses. Endotracheal tube and orogastric tube good position. COPD. RIGHT pleural effusion. CTA HEAD Anterior circulation: The internal carotid arteries, anterior cerebral arteries, and middle cerebral arteries are widely patent. No significant stenosis, proximal occlusion, aneurysm, or vascular malformation. Posterior circulation: Diminutive. Fetal origin BILATERAL PCA. Both vertebrals contribute to basilar formation. No significant stenosis, proximal occlusion, aneurysm, or vascular malformation. Venous sinuses: As permitted by contrast timing, patent. Anatomic variants: BILATERAL fetal PCA. Delayed phase:   No abnormal intracranial enhancement. IMPRESSION: No intracranial or extracranial stenosis of significance. 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 COPD, with RIGHT pleural effusion. Electronically Signed   By: Staci Righter M.D.   On: 12/09/2016 16:01   Ct Angio Neck W Or Wo Contrast  Result Date: 12/09/2016 CLINICAL DATA:  LEFT-sided  deficit, subsequently reversed with improvement in hypotension. History of multiple sclerosis. History of breast cancer. EXAM: CT ANGIOGRAPHY HEAD AND NECK TECHNIQUE: Multidetector CT imaging of the head and neck was performed using the standard protocol during bolus administration of intravenous contrast. Multiplanar CT image reconstructions and MIPs were obtained to evaluate the vascular anatomy. Carotid stenosis measurements (when applicable) are obtained utilizing NASCET criteria, using the distal internal carotid diameter as the denominator. CONTRAST:  CONTRAST 50 mL Isovue 370. COMPARISON:  CT head earlier today. FINDINGS: CTA NECK Aortic arch: Standard branching. Imaged portion shows no evidence of aneurysm or dissection. No significant stenosis of the major arch vessel origins. Right carotid system: No significant  atheromatous disease. No evidence of dissection, stenosis (50% or greater) or occlusion. Left carotid system: No evidence of dissection, stenosis (50% or greater) or occlusion. Vertebral arteries: Codominant. Both are diminutive reflecting basilar hypoplasia. No evidence of dissection, stenosis (50% or greater) or occlusion. Nonvascular soft tissues: Thyromegaly, question multinodular goiter. No neck masses. Endotracheal tube and orogastric tube good position. COPD. RIGHT pleural effusion. CTA HEAD Anterior circulation: The internal carotid arteries, anterior cerebral arteries, and middle cerebral arteries are widely patent. No significant stenosis, proximal occlusion, aneurysm, or vascular malformation. Posterior circulation: Diminutive. Fetal origin BILATERAL PCA. Both vertebrals contribute to basilar formation. No significant stenosis, proximal occlusion, aneurysm, or vascular malformation. Venous sinuses: As permitted by contrast timing, patent. Anatomic variants: BILATERAL fetal PCA. Delayed phase:   No abnormal intracranial enhancement. IMPRESSION: No intracranial or extracranial stenosis of significance. 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 COPD, with RIGHT pleural effusion. Electronically Signed   By: Staci Righter M.D.   On: 12/09/2016 16:01   Mr Brain Wo Contrast  Result Date: 12/10/2016 CLINICAL DATA:  Altered mental status, LEFT-sided deficits. Follow-up code stroke. History of breast cancer and multiple sclerosis. EXAM: MRI HEAD WITHOUT CONTRAST TECHNIQUE: Multiplanar, multiecho pulse sequences of the brain and surrounding structures were obtained without intravenous contrast. COMPARISON:  CT HEAD Dec 09, 2016 and MRI of the head February 15, 2015 FINDINGS: Moderately motion degraded examination. BRAIN: No reduced diffusion to suggest acute ischemia. No susceptibility artifact to suggest hemorrhage. Mild global parenchymal brain volume loss for age, disproportionate vermian  atrophy, unchanged. Confluent supratentorial white matter T2 hyperintensities radiating from the periventricular margin stable from prior MRI. Stable RIGHT cerebellar and LEFT pontine lesions. RIGHT and possibly LEFT thalamus stable T2 hyperintensities. Lesions demonstrate low T1 signal most compatible with black holes of demyelination. No midline shift, mass effect or masses. No abnormal extra-axial fluid collections. VASCULAR: Normal major intracranial vascular flow voids present at skull base. SKULL AND UPPER CERVICAL SPINE: No abnormal sellar expansion. No suspicious calvarial bone marrow signal. Craniocervical junction maintained. SINUSES/ORBITS: The mastoid air-cells and included paranasal sinuses are well-aerated. The included ocular globes and orbital contents are non-suspicious. OTHER: Patient is edentulous. IMPRESSION: No acute intracranial process on this motion degraded examination. Stable examination including severe chronic demyelination and mild parenchymal brain volume loss for age, disproportionately affecting the vermis. Electronically Signed   By: Elon Alas M.D.   On: 12/10/2016 03:45   Dg Chest Port 1 View  Result Date: 12/10/2016 CLINICAL DATA:  Status post intubation. EXAM: PORTABLE CHEST 1 VIEW COMPARISON:  Radiograph of Dec 09, 2016. FINDINGS: Stable cardiomediastinal silhouette. Endotracheal and nasogastric tubes are unchanged in position. No pneumothorax is noted. Mild bibasilar atelectasis or infiltrates are noted with possible associated minimal pleural effusions. Bony thorax is unremarkable. IMPRESSION: Mild bibasilar atelectasis or infiltrates  are noted with possible associated minimal pleural effusions. Stable support apparatus. Electronically Signed   By: Marijo Conception, M.D.   On: 12/10/2016 08:19   Dg Chest Portable 1 View  Result Date: 12/09/2016 CLINICAL DATA:  Intubation. EXAM: PORTABLE CHEST 1 VIEW COMPARISON:  Nov 26, 2013 FINDINGS: An NG tube terminates below  today's film. An ET tube is in good position terminating in the mid trachea. No pneumothorax. The cardiomediastinal silhouette is stable. The left base is poorly evaluated. No acute abnormalities are seen. IMPRESSION: The ETT is in good position. The NG tube terminates below today's film. No acute abnormalities otherwise seen. Electronically Signed   By: Dorise Bullion III M.D   On: 12/09/2016 14:49   Ct Head Code Stroke W/o Cm  Result Date: 12/09/2016 CLINICAL DATA:  Code stroke. LEFT-sided deficit. History of multiple sclerosis. EXAM: CT HEAD WITHOUT CONTRAST TECHNIQUE: Contiguous axial images were obtained from the base of the skull through the vertex without intravenous contrast. COMPARISON:  MR brain 02/15/2015. FINDINGS: Brain: No evidence for acute infarction, hemorrhage, mass lesion, hydrocephalus, or extra-axial fluid. Moderate cerebral and cerebellar atrophy, premature for age. Extensive white matter signal abnormality, consistent with the diagnosis of multiple sclerosis. No definite progression from priors. Vascular: No hyperdense vessel or unexpected calcification. Skull: Normal. Negative for fracture or focal lesion. Sinuses/Orbits: No acute finding. Other: None. ASPECTS [facility] Stroke Program Early CT Score) - Ganglionic level infarction (caudate, lentiform nuclei, internal capsule, insula, M1-M3 cortex): 7 - Supraganglionic infarction (M4-M6 cortex): 3 Total score (0-10 with 10 being normal): 10 IMPRESSION: 1. Premature for age atrophy. Extensive white matter disease, correlating with the history of multiple sclerosis. No definite acute stroke. No proximal large vessel occlusion. CTA pending. 2. ASPECTS is 10. A text message with these findings was sent to the stroke neurologist at 3:25 p.m. Electronically Signed   By: Staci Righter M.D.   On: 12/09/2016 15:27    Review of Systems  Unable to perform ROS: Intubated   Blood pressure 98/85, pulse (!) 125, temperature 98.1 F (36.7 C),  temperature source Oral, resp. rate (!) 25, height '5\' 8"'$  (1.727 m), weight 53.2 kg (117 lb 4.6 oz), SpO2 100 %. Physical Exam  Constitutional:  Thin female on the ventilator, who looks very ill.  Hypotensive on Levophed, tachycardia with HR up in the 130's while I was there.  She feels febrile now.    HENT:  Head: Normocephalic and atraumatic.  She is intubated  Eyes: Right eye exhibits no discharge. Left eye exhibits no discharge. No scleral icterus.  Pupils are equal  Neck: Normal range of motion. Neck supple. No JVD present. No tracheal deviation present. No thyromegaly present.  Cardiovascular: Normal rate, regular rhythm and normal heart sounds.   No murmur heard. She is cold and no distal pulses noted on exam  Respiratory: Effort normal and breath sounds normal. No respiratory distress. She has no wheezes. She has no rales.  She is intubated and on the VENT.  GI: She exhibits distension. She exhibits no mass. There is tenderness (Very tender). There is rebound and guarding.  Musculoskeletal: She exhibits no edema or tenderness.  Lymphadenopathy:    She has no cervical adenopathy.  Neurological:  She is intubated but will try to follow some commands.  She clearly very tender on abdominal exam.   Skin: Skin is dry. No rash noted. No erythema. No pallor.  She feels febrile above the trunk, and cold below the abdomen  Psychiatric:  Could not access.    Assessment/Plan: Sepsis with viscus perforation Shock secondary to sepsis Acute renal failure Leukopenia - severe sepsis Acute metabolic encephalopathy  Some history of dementia Multiple sclerosis   Plan:  Pt has been seen and evaluated by Dr. Brantley Stage.  He plans to talk to the family and take her to the OR for exploratory laparotomy for perforated viscus and sepsis this AM.  OR is aware and making preparations.   Agree with antibiotics and CCM support.     Jaxon Flatt 12/10/2016, 9:45 AM

## 2016-12-10 NOTE — Anesthesia Preprocedure Evaluation (Addendum)
Anesthesia Evaluation  Patient identified by MRN, date of birth, ID band Patient unresponsive    Reviewed: Allergy & Precautions, NPO status , Patient's Chart, lab work & pertinent test results, Unable to perform ROS - Chart review onlyPreop documentation limited or incomplete due to emergent nature of procedure.  History of Anesthesia Complications Negative for: history of anesthetic complications  Airway Mallampati: Intubated       Dental   Pulmonary former smoker,  Intubated 12/09/16 for confusion, hypoxemia and resp failure with sepsis   breath sounds clear to auscultation       Cardiovascular  Rhythm:Regular Rate:Tachycardia  Hypotensive, on Norepinephrine infusion to support BP   Neuro/Psych  Neuromuscular disease (MS: memory loss, gait abnormality) CVA (L hemiparesis?)    GI/Hepatic Neg liver ROS, Free air abdomen   Endo/Other  negative endocrine ROS  Renal/GU Renal InsufficiencyRenal disease (creat 1.34)     Musculoskeletal   Abdominal   Peds  Hematology negative hematology ROS (+)   Anesthesia Other Findings Breast cancer  Reproductive/Obstetrics                           Anesthesia Physical Anesthesia Plan  ASA: IV and emergent  Anesthesia Plan: General   Post-op Pain Management:    Induction: Inhalational  Airway Management Planned: Oral ETT  Additional Equipment: Arterial line  Intra-op Plan:   Post-operative Plan: Post-operative intubation/ventilation  Informed Consent:   History available from chart only and Only emergency history available  Plan Discussed with: CRNA and Surgeon  Anesthesia Plan Comments: (Plan routine monitors, A line, GETA with existing ETT, post op ventilation)       Anesthesia Quick Evaluation

## 2016-12-10 NOTE — Progress Notes (Signed)
[facility] Progress Note Patient Name: Melissa Oconnell DOB: 1956-02-16 MRN: [id number]   Date of Service  12/10/2016  HPI/Events of Note  Radiologist calls to discuss report of CT scan of abdomen and pelvis. No definite diagnosis based on scan of the abdomen and pelvis.  Could be inflammation, infection, acute abdomen, ischemia.   Blood pressure 108/83, heart rate 120, respiratory rate 20.   She responds well to fluid boluses.   Currently intubated, comfortable. On prn sedation  On abx  eICU Interventions  Will keep on maintenance fluids.  Continue antibiotics.   Observe abdomen.      Intervention Category Major Interventions: Other:  [place] 12/10/2016, 4:51 AM

## 2016-12-10 NOTE — Progress Notes (Signed)
Patient has been transferred to OR with CRNA, on Levo gtt no sedation.  Gershon Crane

## 2016-12-10 NOTE — Procedures (Signed)
Central Venous Catheter Insertion Procedure Note Melissa Oconnell [id number] [birth year]  Procedure: Insertion of Central Venous Catheter Indications: Assessment of intravascular volume, Drug and/or fluid administration and need for pressors  Procedure Details Consent: Risks of procedure as well as the alternatives and risks of each were explained to the (patient/caregiver).  Consent for procedure obtained. Time Out: Verified patient identification, verified procedure, site/side was marked, verified correct patient position, special equipment/implants available, medications/allergies/relevent history reviewed, required imaging and test results available.  Performed  Maximum sterile technique was used including antiseptics, cap, gloves, gown, hand hygiene, mask and sheet. Skin prep: Chlorhexidine; local anesthetic administered A antimicrobial bonded/coated triple lumen catheter was placed in the left subclavian vein using the Seldinger technique.  Evaluation Blood flow good Complications: No apparent complications Patient did tolerate procedure well. Chest X-ray ordered to verify placement.  CXR: pending.  Sharia Reeve 12/10/2016, 4:29 PM

## 2016-12-10 NOTE — Op Note (Signed)
Preoperative diagnosis: Peritonitis with free air and sepsis  Postoperative diagnosis: Perforated sigmoid colon from sigmoid diverticulitis with fecal peritonitis and Hinchey 4  Procedure: Exploratory laparotomy with sigmoid colectomy and formation of colostomy with lysis of adhesions  Surgeon: Erroll Luna M.D.  Anesthesia: Gen.  DL 100 mL  Specimen sigmoid colon with perforation 2 pathology  Drains: 19 round drain to pelvis  Indications for procedure: The patient is a 61 year old female with brought to the emergency room yesterday after a syncopal episode. She's been battling constipation was placed on laxatives earlier in the week. Her bowels begun removed. When she initially presented unresponsive she was felt to have had a stroke. She was evaluated for this and this was negative. She had a tender abdomen at admission and CT scan revealed free air. Surgery was consulted. Upon examination she was intubated on inotropic support in septic shock. She had diffuse peritonitis on exam after reviewing her CT scan had evidence of a perforated viscus. I discussed this with her care provider explained emergent need for exploratory laparotomy.The procedure has been discussed with the care provider .  Alternative therapies have been discussed with the patient.  Operative risks include bleeding,  Infection,  Organ injury,  Nerve injury,  Blood vessel injury,  DVT,  Pulmonary embolism, ostomy   Death,  And possible reoperation.  Medical management risks include worsening of present situation.  The success of the procedure is 50 -90 % at treating patients symptoms.  The patient understands and agrees to proceed.   Description of procedure: The patient was taken emergently from the ICU to the operating room. Placed upon the OR table. She was oriented they intubated and sedated. Gen. anesthesia initiated and the abdomen was prepped and draped in sterile fashion. Timeout was done. Proper patient procedure  verified. She was already on preoperative antibiotics. Midline incision was used. The abdominal cavity was entered and there was feculent gross contamination all 4 quadrants. This was suctioned out. A retractor was placed. The stomach was normal. The duodenum was normal. The gallbladder was normal. Small bowel was run from the ligament of Treitz to the ileocecal valve. Small bowel was matted down to the left lower quadrant sigmoid colon. There is evidence of free sigmoid colonic perforation which appeared to be from diverticular disease with gross fecal contamination. Adhesions within the small bowel loops are taken down to further evaluate this. There is significant serosal inflammation but no signs of injury. This was run to the ileocecal valve. There is no evidence of appendicitis. A sending colon transverse colon descending colon are grossly normal. Sigmoid colon was massively inflamed with free perforation mid sigmoid colon. I mobilized sigmoid colon from the retroperitoneum. The ureter was identified and preserved the left. Stable was used to divide the mid sigmoid colon proximal to perforation and then a second load of the same GIA 75 stapler was used to divide the distal sigmoid colon. This was removed at fusion the LigaSure to take down the mesentery. 6 L of irrigation were used to irrigate out the abdominal cavity. I reexamined small large bowel to verify there are no other abnormalities or injuries not. The left lower quadrant colostomy brought out after a circular incision was made through the skin in the transverse incision to the fascia of the rectus muscle. He was quite thin colostomy port outlet ample length. There is no signs of ischemia. There is no twisting. After voiding out the colostomy all sponges were counted and found to be correct.  There is no signs of any ongoing bleeding. I closed the fascia with double-stranded #1 PDS. The wound VAC was placed in the subcutaneous tissues. The colostomy  is matured with 3-0 Vicryl. Appliance placed. All final counts are found to be correct. The patient was taken to the ICU in stable condition intubated but still frequently ill.

## 2016-12-11 ENCOUNTER — Inpatient Hospital Stay (HOSPITAL_COMMUNITY): Payer: Medicare Other

## 2016-12-11 ENCOUNTER — Encounter (HOSPITAL_COMMUNITY): Payer: Self-pay | Admitting: Surgery

## 2016-12-11 DIAGNOSIS — I313 Pericardial effusion (noninflammatory): Secondary | ICD-10-CM

## 2016-12-11 LAB — COMPREHENSIVE METABOLIC PANEL
ALK PHOS: 50 U/L (ref 38–126)
ALT: 33 U/L (ref 14–54)
ANION GAP: 7 (ref 5–15)
AST: 64 U/L — ABNORMAL HIGH (ref 15–41)
Albumin: 2 g/dL — ABNORMAL LOW (ref 3.5–5.0)
BUN: 17 mg/dL (ref 6–20)
CO2: 18 mmol/L — ABNORMAL LOW (ref 22–32)
CREATININE: 1.01 mg/dL — AB (ref 0.44–1.00)
Calcium: 7.1 mg/dL — ABNORMAL LOW (ref 8.9–10.3)
Chloride: 105 mmol/L (ref 101–111)
GFR, EST NON AFRICAN AMERICAN: 59 mL/min — AB (ref >60)
Glucose, Bld: 50 mg/dL — ABNORMAL LOW (ref 65–99)
Potassium: 4.1 mmol/L (ref 3.5–5.1)
SODIUM: 130 mmol/L — AB (ref 135–145)
TOTAL PROTEIN: 4.5 g/dL — AB (ref 6.5–8.1)
Total Bilirubin: 0.6 mg/dL (ref 0.3–1.2)

## 2016-12-11 LAB — GLUCOSE, CAPILLARY
GLUCOSE-CAPILLARY: 68 mg/dL (ref 65–99)
GLUCOSE-CAPILLARY: 99 mg/dL (ref 65–99)
GLUCOSE-CAPILLARY: 103 mg/dL — AB (ref 65–99)
Glucose-Capillary: 40 mg/dL — CL (ref 65–99)
Glucose-Capillary: 211 mg/dL — ABNORMAL HIGH (ref 65–99)
Glucose-Capillary: 64 mg/dL — ABNORMAL LOW (ref 65–99)
Glucose-Capillary: 120 mg/dL — ABNORMAL HIGH (ref 65–99)
Glucose-Capillary: 96 mg/dL (ref 65–99)
Glucose-Capillary: 100 mg/dL — ABNORMAL HIGH (ref 65–99)

## 2016-12-11 LAB — MAGNESIUM: MAGNESIUM: 2.4 mg/dL (ref 1.7–2.4)

## 2016-12-11 LAB — CBC
HCT: 38.8 % (ref 36.0–46.0)
HEMOGLOBIN: 13 g/dL (ref 12.0–15.0)
MCH: 30.4 pg (ref 26.0–34.0)
MCHC: 33.5 g/dL (ref 30.0–36.0)
MCV: 90.7 fL (ref 78.0–100.0)
PLATELETS: 187 10*3/uL (ref 150–400)
RBC: 4.28 MIL/uL (ref 3.87–5.11)
RDW: 13.5 % (ref 11.5–15.5)
WBC: 7.8 10*3/uL (ref 4.0–10.5)

## 2016-12-11 LAB — BLOOD GAS, ARTERIAL
ACID-BASE DEFICIT: 7.5 mmol/L — AB (ref 0.0–2.0)
BICARBONATE: 16.2 mmol/L — AB (ref 20.0–28.0)
Drawn by: 236041
FIO2: 40
MECHVT: 500 mL
O2 Saturation: 97.6 %
PEEP: 5 cmH2O
PO2 ART: 107 mmHg (ref 83.0–108.0)
Patient temperature: 98.6
RATE: 14 resp/min
pCO2 arterial: 25.8 mmHg — ABNORMAL LOW (ref 32.0–48.0)
pH, Arterial: 7.415 (ref 7.350–7.450)

## 2016-12-11 LAB — ECHOCARDIOGRAM COMPLETE
Height: 68 in
Weight: 1985.9 oz

## 2016-12-11 LAB — PHOSPHORUS: PHOSPHORUS: 4 mg/dL (ref 2.5–4.6)

## 2016-12-11 MED ORDER — DEXTROSE 50 % IV SOLN
INTRAVENOUS | Status: AC
  Administered 2016-12-11: 50 mL
  Filled 2016-12-11: qty 100

## 2016-12-11 MED ORDER — VANCOMYCIN HCL 500 MG IV SOLR
500.0000 mg | Freq: Two times a day (BID) | INTRAVENOUS | Status: DC
  Administered 2016-12-11: 500 mg via INTRAVENOUS
  Filled 2016-12-11: qty 500

## 2016-12-11 MED ORDER — VANCOMYCIN HCL 500 MG IV SOLR
500.0000 mg | Freq: Two times a day (BID) | INTRAVENOUS | Status: DC
  Administered 2016-12-11 – 2016-12-12 (×2): 500 mg via INTRAVENOUS
  Filled 2016-12-11 (×2): qty 500

## 2016-12-11 MED ORDER — DEXTROSE-NACL 5-0.9 % IV SOLN
INTRAVENOUS | Status: DC
  Administered 2016-12-11: 100 mL/h via INTRAVENOUS
  Administered 2016-12-11 – 2016-12-12 (×2): via INTRAVENOUS
  Administered 2016-12-12: 100 mL/h via INTRAVENOUS
  Administered 2016-12-13 – 2016-12-16 (×4): via INTRAVENOUS

## 2016-12-11 MED ORDER — DEXTROSE 50 % IV SOLN
INTRAVENOUS | Status: AC
  Administered 2016-12-11: 50 mL
  Filled 2016-12-11: qty 50

## 2016-12-11 MED ORDER — VASOPRESSIN 20 UNIT/ML IV SOLN
0.0300 [IU]/min | INTRAVENOUS | Status: DC
  Administered 2016-12-11: 0.03 [IU]/min via INTRAVENOUS
  Filled 2016-12-11: qty 2

## 2016-12-11 MED ORDER — SODIUM CHLORIDE 0.9 % IV BOLUS (SEPSIS)
500.0000 mL | Freq: Once | INTRAVENOUS | Status: AC
  Administered 2016-12-11: 500 mL via INTRAVENOUS

## 2016-12-11 NOTE — Progress Notes (Signed)
eLink Physician-Brief Progress Note Patient Name: Melissa Oconnell DOB: [birth year] MRN: [id number]   Date of Service  12/11/2016  HPI/Events of Note  Called by bedside nursing for shock, arrhythmia Chart reviewed, has MS, taken to OR today for perforated sigmoid colon, has colostomy Levophed requirements increasing and now with sustained HR 150's  eICU Interventions  Check 12 lead to evaluate for Aflutter Bolus saline 500cc saline now Add vasopressin to decrease dose of levophed     Intervention Category Major Interventions: Shock - evaluation and management;Arrhythmia - evaluation and management  Simonne Maffucci 12/11/2016, 12:19 AM

## 2016-12-11 NOTE — Progress Notes (Signed)
Pharmacy Antibiotic Note  Melissa Oconnell is a 61 y.o. female who presented to the [facility] on 12/09/2016 with L-sided weakness and non-verbal concerning for CVA. The patient was noted to have SIRs and pharmacy has been consulted for Vancomycin+ Zosyn dosing for r/o sepsis. Scr has improved. Tmax is 100 and WBC is WNL  Plan: Change vancomycin to 500mg  IVQ 12H Continue zosyn 3.375gm IV Q8H (4 hr inf) F/u renal fxn, C&S, clinical status and trough at SS  Height: 5\' 8"  (172.7 cm) Weight: 124 lb 1.9 oz (56.3 kg) IBW/kg (Calculated) : 63.9  Temp (24hrs), Avg:98.8 F (37.1 C), Min:97.8 F (36.6 C), Max:100 F (37.8 C)   Recent Labs Lab 12/09/16 1415 12/09/16 1420 12/09/16 1735 12/09/16 1901 12/09/16 2152 12/10/16 0005 12/10/16 0304 12/11/16 0415  WBC 4.7  --   --  1.6*  --   --  2.0* 7.8  CREATININE 1.50* 1.30*  --  1.45*  --   --  1.34* 1.01*  LATICACIDVEN  --   --  4.97*  --  5.8* 5.3*  --   --     Estimated Creatinine Clearance: 52.6 mL/min (A) (by C-G formula based on SCr of 1.01 mg/dL (H)).    No Known Allergies  Antimicrobials this admission: Vanc 5/26 >> Zosyn 5/26 >>  Dose adjustments this admission: 5/28 Vanc changed to 500mg  Q12H due to improving renal fxn  Microbiology results: 5/26 MRSA - NEG 5/26 Urine - NEG 5/26 Blood - NGTD 5/27 TA - pending  Thank you for allowing pharmacy to be a part of this patient's care.  Salome Arnt, PharmD, BCPS Pager # [phone] 12/11/2016 9:47 AM

## 2016-12-11 NOTE — Progress Notes (Signed)
[facility] Progress Note 1 Day Post-Op  Subjective: Patient is awake, alert and appropriately responsive on the ventilator.  Objective: Vital signs in last 24 hours: Temp:  [97.8 F (36.6 C)-100 F (37.8 C)] 100 F (37.8 C) (05/28 0753) Pulse Rate:  [111-141] 111 (05/28 0819) Resp:  [14-28] 16 (05/28 0819) BP: (54-126)/(21-93) 93/76 (05/28 0819) SpO2:  [96 %-100 %] 100 % (05/28 0310) FiO2 (%):  [30 %-40 %] 30 % (05/28 0819) Weight:  [56.3 kg (124 lb 1.9 oz)] 56.3 kg (124 lb 1.9 oz) (05/28 0417) Last BM Date: 12/11/16  Intake/Output from previous day: 05/27 0701 - 05/28 0700 In: 5802.9 [I.V.:5152.9; IV Piggyback:650] Out: 3700 [Urine:2485; Emesis/NG output:400; Drains:665; Blood:150] Intake/Output this shift: Total I/O In: 145.7 [I.V.:145.7] Out: -   General: No acute distress at rest  Lungs: Clear.  Oxygen saturations 100% on FIO2 30%  Abd: Soft, tender, no bowel sounds, but lats of stool in colostomy bage.  NPWD in place  Extremities: No changes  Neuro: Seems to be intact  Lab Results:  @LABLAST2 (wbc:2,hgb:2,hct:2,plt:2) BMET ) Recent Labs  12/10/16 0304 12/10/16 1400 12/11/16 0415  NA 131* 131* 130*  K 3.8 4.7 4.1  CL 101  --  105  CO2 18*  --  18*  GLUCOSE 101*  --  50*  BUN 19  --  17  CREATININE 1.34*  --  1.01*  CALCIUM 8.5*  --  7.1*   PT/INR  Recent Labs  12/09/16 1415  LABPROT 15.7*  INR 1.24   ABG  Recent Labs  12/10/16 1400 12/11/16 0310  PHART 7.302* 7.415  HCO3 18.7* 16.2*    Studies/Results: Ct Abdomen Pelvis Wo Contrast  Result Date: 12/10/2016 CLINICAL DATA:  Abdominal guarding. Coffee-ground emesis. History of breast cancer and multiple sclerosis. Code stroke. EXAM: CT ABDOMEN AND PELVIS WITHOUT CONTRAST TECHNIQUE: Multidetector CT imaging of the abdomen and pelvis was performed following the standard protocol without IV contrast. COMPARISON:  02/07/2009 FINDINGS: Lower chest: Small bilateral pleural effusions with basilar  atelectasis. Emphysematous changes in the lung bases. An enteric tube is present with tip in the upper stomach. Hepatobiliary: Increased density in the gallbladder suggesting sludge. No focal liver lesions. Pancreas: Unremarkable. No pancreatic ductal dilatation or surrounding inflammatory changes. Spleen: Normal in size without focal abnormality. Adrenals/Urinary Tract: There is residual contrast material in the renal collecting systems and bladder. Bladder is decompressed with a Foley catheter. No hydronephrosis or hydroureter. Kidneys are symmetrical in size. Stomach/Bowel: Diffuse small bowel wall thickening with mild distention of fluid-filled loops. Colon is mostly decompressed with stool in the right side and transverse regions. Mild colonic wall thickening. Bowel and colonic wall thickening may be related to ascites or could represent an enterocolitis. Infectious or inflammatory etiologies may be considered. Bowel ischemia not excluded. No pneumatosis. No mesenteric atherosclerosis. Appendix is not identified. Vascular/Lymphatic: No significant vascular findings are present. No enlarged abdominal or pelvic lymph nodes. Reproductive: Uterus and bilateral adnexa are unremarkable. Other: Moderate diffuse abdominal and pelvic ascites. There few small bubbles of gas that are not definitively within bowel and suggest a small amount of free intra-abdominal air. In the absence of recent surgery, this appearance would be worrisome for bowel perforation. Abdominal wall musculature appears intact. Musculoskeletal: No destructive bone lesions. IMPRESSION: Mildly dilated fluid-filled small bowel with diffuse small bowel and colonic wall thickening. Diffuse abdominal ascites. Possible small amount of free air. Bowel changes could be due to ascites but bowel ischemia, infectious or inflammatory enterocolitis could also be considered.  The presence of small amount of free air is worrisome for bowel perforation. Laboratory  correlation is suggested. Small bilateral pleural effusions with basilar atelectasis. Emphysematous changes in the lung bases. Probable gallbladder sludge. These results were called by telephone at the time of interpretation on 12/10/2016 at 2:40 am to Dr. Corrie Dandy, who verbally acknowledged these results. Electronically Signed   By: Lucienne Capers M.D.   On: 12/10/2016 02:53   Ct Angio Head W Or Wo Contrast  Result Date: 12/09/2016 CLINICAL DATA:  LEFT-sided deficit, subsequently reversed with improvement in hypotension. History of multiple sclerosis. History of breast cancer. EXAM: CT ANGIOGRAPHY HEAD AND NECK TECHNIQUE: Multidetector CT imaging of the head and neck was performed using the standard protocol during bolus administration of intravenous contrast. Multiplanar CT image reconstructions and MIPs were obtained to evaluate the vascular anatomy. Carotid stenosis measurements (when applicable) are obtained utilizing NASCET criteria, using the distal internal carotid diameter as the denominator. CONTRAST:  CONTRAST 50 mL Isovue 370. COMPARISON:  CT head earlier today. FINDINGS: CTA NECK Aortic arch: Standard branching. Imaged portion shows no evidence of aneurysm or dissection. No significant stenosis of the major arch vessel origins. Right carotid system: No significant atheromatous disease. No evidence of dissection, stenosis (50% or greater) or occlusion. Left carotid system: No evidence of dissection, stenosis (50% or greater) or occlusion. Vertebral arteries: Codominant. Both are diminutive reflecting basilar hypoplasia. No evidence of dissection, stenosis (50% or greater) or occlusion. Nonvascular soft tissues: Thyromegaly, question multinodular goiter. No neck masses. Endotracheal tube and orogastric tube good position. COPD. RIGHT pleural effusion. CTA HEAD Anterior circulation: The internal carotid arteries, anterior cerebral arteries, and middle cerebral arteries are widely patent. No significant  stenosis, proximal occlusion, aneurysm, or vascular malformation. Posterior circulation: Diminutive. Fetal origin BILATERAL PCA. Both vertebrals contribute to basilar formation. No significant stenosis, proximal occlusion, aneurysm, or vascular malformation. Venous sinuses: As permitted by contrast timing, patent. Anatomic variants: BILATERAL fetal PCA. Delayed phase:   No abnormal intracranial enhancement. IMPRESSION: No intracranial or extracranial stenosis of significance. 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 COPD, with RIGHT pleural effusion. Electronically Signed   By: Staci Righter M.D.   On: 12/09/2016 16:01   Ct Angio Neck W Or Wo Contrast  Result Date: 12/09/2016 CLINICAL DATA:  LEFT-sided deficit, subsequently reversed with improvement in hypotension. History of multiple sclerosis. History of breast cancer. EXAM: CT ANGIOGRAPHY HEAD AND NECK TECHNIQUE: Multidetector CT imaging of the head and neck was performed using the standard protocol during bolus administration of intravenous contrast. Multiplanar CT image reconstructions and MIPs were obtained to evaluate the vascular anatomy. Carotid stenosis measurements (when applicable) are obtained utilizing NASCET criteria, using the distal internal carotid diameter as the denominator. CONTRAST:  CONTRAST 50 mL Isovue 370. COMPARISON:  CT head earlier today. FINDINGS: CTA NECK Aortic arch: Standard branching. Imaged portion shows no evidence of aneurysm or dissection. No significant stenosis of the major arch vessel origins. Right carotid system: No significant atheromatous disease. No evidence of dissection, stenosis (50% or greater) or occlusion. Left carotid system: No evidence of dissection, stenosis (50% or greater) or occlusion. Vertebral arteries: Codominant. Both are diminutive reflecting basilar hypoplasia. No evidence of dissection, stenosis (50% or greater) or occlusion. Nonvascular soft tissues: Thyromegaly, question  multinodular goiter. No neck masses. Endotracheal tube and orogastric tube good position. COPD. RIGHT pleural effusion. CTA HEAD Anterior circulation: The internal carotid arteries, anterior cerebral arteries, and middle cerebral arteries are widely patent. No significant stenosis, proximal  occlusion, aneurysm, or vascular malformation. Posterior circulation: Diminutive. Fetal origin BILATERAL PCA. Both vertebrals contribute to basilar formation. No significant stenosis, proximal occlusion, aneurysm, or vascular malformation. Venous sinuses: As permitted by contrast timing, patent. Anatomic variants: BILATERAL fetal PCA. Delayed phase:   No abnormal intracranial enhancement. IMPRESSION: No intracranial or extracranial stenosis of significance. 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 COPD, with RIGHT pleural effusion. Electronically Signed   By: Staci Righter M.D.   On: 12/09/2016 16:01   Mr Brain Wo Contrast  Result Date: 12/10/2016 CLINICAL DATA:  Altered mental status, LEFT-sided deficits. Follow-up code stroke. History of breast cancer and multiple sclerosis. EXAM: MRI HEAD WITHOUT CONTRAST TECHNIQUE: Multiplanar, multiecho pulse sequences of the brain and surrounding structures were obtained without intravenous contrast. COMPARISON:  CT HEAD Dec 09, 2016 and MRI of the head February 15, 2015 FINDINGS: Moderately motion degraded examination. BRAIN: No reduced diffusion to suggest acute ischemia. No susceptibility artifact to suggest hemorrhage. Mild global parenchymal brain volume loss for age, disproportionate vermian atrophy, unchanged. Confluent supratentorial white matter T2 hyperintensities radiating from the periventricular margin stable from prior MRI. Stable RIGHT cerebellar and LEFT pontine lesions. RIGHT and possibly LEFT thalamus stable T2 hyperintensities. Lesions demonstrate low T1 signal most compatible with black holes of demyelination. No midline shift, mass effect or masses.  No abnormal extra-axial fluid collections. VASCULAR: Normal major intracranial vascular flow voids present at skull base. SKULL AND UPPER CERVICAL SPINE: No abnormal sellar expansion. No suspicious calvarial bone marrow signal. Craniocervical junction maintained. SINUSES/ORBITS: The mastoid air-cells and included paranasal sinuses are well-aerated. The included ocular globes and orbital contents are non-suspicious. OTHER: Patient is edentulous. IMPRESSION: No acute intracranial process on this motion degraded examination. Stable examination including severe chronic demyelination and mild parenchymal brain volume loss for age, disproportionately affecting the vermis. Electronically Signed   By: Elon Alas M.D.   On: 12/10/2016 03:45   Dg Chest Port 1 View  Result Date: 12/11/2016 CLINICAL DATA:  Follow-up respiratory failure EXAM: PORTABLE CHEST 1 VIEW COMPARISON:  04/13/2017 FINDINGS: Cardiac shadow is stable. Left subclavian central line, nasogastric catheter and endotracheal tube are again seen and stable. Bibasilar atelectatic changes are again seen and stable. No new focal abnormality is seen. IMPRESSION: Stable bibasilar changes. Tubes and lines as described above. Electronically Signed   By: Inez Catalina M.D.   On: 12/11/2016 07:53   Dg Chest Port 1 View  Result Date: 12/10/2016 CLINICAL DATA:  Central line placement, multiple sclerosis EXAM: PORTABLE CHEST 1 VIEW COMPARISON:  Portable exam 1638 hours compared to 12/10/2016 FINDINGS: Tip of endotracheal tube projects 4.2 cm above carina. Nasogastric tube extends into stomach. LEFT subclavian central venous catheter with tip projecting over cavoatrial junction. Rotated to the LEFT. Stable heart size and mediastinal contours. Persistent RIGHT basilar atelectasis. Persistent atelectasis versus consolidation LEFT lower lobe. Skin folds project over upper RIGHT chest. Upper lungs clear without pneumothorax. IMPRESSION: Persistent LEFT lower lobe  atelectasis versus consolidation and mild RIGHT basilar atelectasis. New LEFT subclavian central venous catheter without pneumothorax. Electronically Signed   By: Lavonia Dana M.D.   On: 12/10/2016 16:47   Dg Chest Port 1 View  Result Date: 12/10/2016 CLINICAL DATA:  Status post intubation. EXAM: PORTABLE CHEST 1 VIEW COMPARISON:  Radiograph of Dec 09, 2016. FINDINGS: Stable cardiomediastinal silhouette. Endotracheal and nasogastric tubes are unchanged in position. No pneumothorax is noted. Mild bibasilar atelectasis or infiltrates are noted with possible associated minimal pleural effusions. Bony thorax is unremarkable. IMPRESSION:  Mild bibasilar atelectasis or infiltrates are noted with possible associated minimal pleural effusions. Stable support apparatus. Electronically Signed   By: Marijo Conception, M.D.   On: 12/10/2016 08:19   Dg Chest Portable 1 View  Result Date: 12/09/2016 CLINICAL DATA:  Intubation. EXAM: PORTABLE CHEST 1 VIEW COMPARISON:  Nov 26, 2013 FINDINGS: An NG tube terminates below today's film. An ET tube is in good position terminating in the mid trachea. No pneumothorax. The cardiomediastinal silhouette is stable. The left base is poorly evaluated. No acute abnormalities are seen. IMPRESSION: The ETT is in good position. The NG tube terminates below today's film. No acute abnormalities otherwise seen. Electronically Signed   By: Dorise Bullion III M.D   On: 12/09/2016 14:49   Ct Head Code Stroke W/o Cm  Result Date: 12/09/2016 CLINICAL DATA:  Code stroke. LEFT-sided deficit. History of multiple sclerosis. EXAM: CT HEAD WITHOUT CONTRAST TECHNIQUE: Contiguous axial images were obtained from the base of the skull through the vertex without intravenous contrast. COMPARISON:  MR brain 02/15/2015. FINDINGS: Brain: No evidence for acute infarction, hemorrhage, mass lesion, hydrocephalus, or extra-axial fluid. Moderate cerebral and cerebellar atrophy, premature for age. Extensive white  matter signal abnormality, consistent with the diagnosis of multiple sclerosis. No definite progression from priors. Vascular: No hyperdense vessel or unexpected calcification. Skull: Normal. Negative for fracture or focal lesion. Sinuses/Orbits: No acute finding. Other: None. ASPECTS [facility] Stroke Program Early CT Score) - Ganglionic level infarction (caudate, lentiform nuclei, internal capsule, insula, M1-M3 cortex): 7 - Supraganglionic infarction (M4-M6 cortex): 3 Total score (0-10 with 10 being normal): 10 IMPRESSION: 1. Premature for age atrophy. Extensive white matter disease, correlating with the history of multiple sclerosis. No definite acute stroke. No proximal large vessel occlusion. CTA pending. 2. ASPECTS is 10. A text message with these findings was sent to the stroke neurologist at 3:25 p.m. Electronically Signed   By: Staci Righter M.D.   On: 12/09/2016 15:27    Anti-infectives: Anti-infectives    Start     Dose/Rate Route Frequency Ordered Stop   12/10/16 1700  vancomycin (VANCOCIN) IVPB 750 mg/150 ml premix  Status:  Discontinued     750 mg 150 mL/hr over 60 Minutes Intravenous Every 12 hours 12/09/16 1532 12/10/16 1025   12/10/16 1700  vancomycin (VANCOCIN) IVPB 750 mg/150 ml premix     750 mg 150 mL/hr over 60 Minutes Intravenous Every 24 hours 12/10/16 1025     12/10/16 0000  piperacillin-tazobactam (ZOSYN) IVPB 3.375 g     3.375 g 12.5 mL/hr over 240 Minutes Intravenous Every 8 hours 12/09/16 1532     12/09/16 1530  piperacillin-tazobactam (ZOSYN) IVPB 3.375 g     3.375 g 100 mL/hr over 30 Minutes Intravenous  Once 12/09/16 1520 12/09/16 1659   12/09/16 1530  vancomycin (VANCOCIN) IVPB 1000 mg/200 mL premix     1,000 mg 200 mL/hr over 60 Minutes Intravenous  Once 12/09/16 1520 12/09/16 1801      Assessment/Plan: s/p Procedure(s): 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  Remove OGT with extubation. May be NPO except for ice chips.  LOS: 2 days   Kathryne Eriksson. Dahlia Bailiff, MD, FACS [phone] [facility] Surgery 12/11/2016

## 2016-12-11 NOTE — Anesthesia Postprocedure Evaluation (Addendum)
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 : hemodynamics improving. Anesthetic complications: no       Last Vitals:  Vitals:   12/11/16 0615 12/11/16 0700  BP: (!) 98/59 91/73  Pulse:    Resp: (!) 22 (!) 21  Temp:      Last Pain:  Vitals:   12/11/16 0414  TempSrc: Oral  PainSc:                  Cardale Dorer,E. Freeland Pracht

## 2016-12-11 NOTE — Progress Notes (Signed)
Initial Nutrition Assessment  DOCUMENTATION CODES:   Not applicable  INTERVENTION:   When GI status allows enteral nutrition, recommend start TF:   Vital AF 1.2 at 25 ml/h, increase by 10 ml every 4 hours to goal rate of 55 ml/h (1320 ml per day)  Provides 1584 kcal, 99 gm protein, 1071 ml free water daily  NUTRITION DIAGNOSIS:   Inadequate oral intake related to inability to eat as evidenced by NPO status.  GOAL:   Patient will meet greater than or equal to 90% of their needs  MONITOR:   Vent status, Skin, Labs, I & O's  REASON FOR ASSESSMENT:   Ventilator    ASSESSMENT:   61 yo female presented with Lt side weakness, dysarthria.  She has hx of MS.  Initial concern for code stroke.  Noted to have hypotension from sepsis.  Had abdominal pain and CT abd/pelvis concerning for free air. She was taken to the OR for ex-lap and found to have a sigmoid perforation. 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  Patient is currently intubated on ventilator support MV: 10.6 L/min Temp (24hrs), Avg:98.8 F (37.1 C), Min:97.8 F (36.6 C), Max:100 F (37.8 C)  Nutrition-Focused physical exam completed. Findings are no fat depletion, mild-severe muscle depletion, and no edema.  Labs reviewed: sodium 130 (L) Medications reviewed.  Diet Order:  Diet NPO time specified  Skin:  Wound (see comment) (wound VAC to surgical abdominal incision)  Last BM:  5/28  Height:   Ht Readings from Last 1 Encounters:  12/09/16 5\' 8"  (1.727 m)    Weight:   Wt Readings from Last 1 Encounters:  12/11/16 124 lb 1.9 oz (56.3 kg)    Ideal Body Weight:  63.6 kg  BMI:  Body mass index is 18.87 kg/m.  Estimated Nutritional Needs:   Kcal:  4888  Protein:  85-95 gm  Fluid:  1.6-1.8 L  EDUCATION NEEDS:   No education needs identified at this time  Molli Barrows, [place], [place], [place] Pager [phone] After Hours Pager [phone]

## 2016-12-11 NOTE — Progress Notes (Signed)
PCCM Progress Note  Admission date: 12/09/2016 Referring provider: Dr. Zigmund Daniel, ER  CC: Lt side weakness  HPI: 61 yo female presented with Lt side weakness, dysarthria.  She has hx of MS.  Initial concern for code stroke.  Noted to have hypotension from sepsis.  Had abdominal pain and CT abd/pelvis concerning for free air. She was taken to the OR for ex-lap and found to have a sigmoid perforation. 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 She was then sent to the ICU for recovery on the vent and required vasopressors for septic shock.   Subjective: To OR yesterday. No acute events overnight.  Vital signs: BP 92/71   Pulse (!) 111   Temp 100 F (37.8 C) (Oral)   Resp 18   Ht 5\' 8"  (1.727 m)   Wt 56.3 kg (124 lb 1.9 oz)   SpO2 100%   BMI 18.87 kg/m   Intake/output: I/O last 3 completed shifts: In: 9283.7 [I.V.:8183.7; IV [id number]:7425] Out: 9563 [Urine:3230; Emesis/NG output:650; Drains:665; Blood:150]  General: Frail elderly female Sleepy on vent. Neuro: Follows commands and interacts. RASS -2 HEENT: ETT in place  Cardiac: RRR, no MRG Chest: Clear, bilateral breath sounds Abd: Surgical dressing in place Ext: No acute deformity or ROM limitation Skin: Grossly intact   CMP Latest Ref Rng & Units 12/11/2016 12/10/2016 12/10/2016  Glucose 65 - 99 mg/dL 50(L) - 101(H)  BUN 6 - 20 mg/dL 17 - 19  Creatinine 0.44 - 1.00 mg/dL 1.01(H) - 1.34(H)  Sodium 135 - 145 mmol/L 130(L) 131(L) 131(L)  Potassium 3.5 - 5.1 mmol/L 4.1 4.7 3.8  Chloride 101 - 111 mmol/L 105 - 101  CO2 22 - 32 mmol/L 18(L) - 18(L)  Calcium 8.9 - 10.3 mg/dL 7.1(L) - 8.5(L)  Total Protein 6.5 - 8.1 g/dL 4.5(L) - -  Total Bilirubin 0.3 - 1.2 mg/dL 0.6 - -  Alkaline Phos 38 - 126 U/L 50 - -  AST 15 - 41 U/L 64(H) - -  ALT 14 - 54 U/L 33 - -     CBC Latest Ref Rng & Units 12/11/2016 12/10/2016 12/10/2016  WBC 4.0 - 10.5 K/uL 7.8 - 2.0(L)  Hemoglobin 12.0 - 15.0 g/dL 13.0 11.9(L) 12.4  Hematocrit 36.0 - 46.0  % 38.8 35.0(L) 38.5  Platelets 150 - 400 K/uL 187 - 216     ABG    Component Value Date/Time   PHART 7.415 12/11/2016 0310   PCO2ART 25.8 (L) 12/11/2016 0310   PO2ART 107 12/11/2016 0310   HCO3 16.2 (L) 12/11/2016 0310   TCO2 20 12/10/2016 1400   ACIDBASEDEF 7.5 (H) 12/11/2016 0310   O2SAT 97.6 12/11/2016 0310     CBG (last 3)   Recent Labs  12/11/16 0751 12/11/16 0845 12/11/16 1007  GLUCAP 68 64* 120*     Imaging: Ct Abdomen Pelvis Wo Contrast  Result Date: 12/10/2016 CLINICAL DATA:  Abdominal guarding. Coffee-ground emesis. History of breast cancer and multiple sclerosis. Code stroke. EXAM: CT ABDOMEN AND PELVIS WITHOUT CONTRAST TECHNIQUE: Multidetector CT imaging of the abdomen and pelvis was performed following the standard protocol without IV contrast. COMPARISON:  02/07/2009 FINDINGS: Lower chest: Small bilateral pleural effusions with basilar atelectasis. Emphysematous changes in the lung bases. An enteric tube is present with tip in the upper stomach. Hepatobiliary: Increased density in the gallbladder suggesting sludge. No focal liver lesions. Pancreas: Unremarkable. No pancreatic ductal dilatation or surrounding inflammatory changes. Spleen: Normal in size without focal abnormality. Adrenals/Urinary Tract: There is residual contrast material  in the renal collecting systems and bladder. Bladder is decompressed with a Foley catheter. No hydronephrosis or hydroureter. Kidneys are symmetrical in size. Stomach/Bowel: Diffuse small bowel wall thickening with mild distention of fluid-filled loops. Colon is mostly decompressed with stool in the right side and transverse regions. Mild colonic wall thickening. Bowel and colonic wall thickening may be related to ascites or could represent an enterocolitis. Infectious or inflammatory etiologies may be considered. Bowel ischemia not excluded. No pneumatosis. No mesenteric atherosclerosis. Appendix is not identified. Vascular/Lymphatic:  No significant vascular findings are present. No enlarged abdominal or pelvic lymph nodes. Reproductive: Uterus and bilateral adnexa are unremarkable. Other: Moderate diffuse abdominal and pelvic ascites. There few small bubbles of gas that are not definitively within bowel and suggest a small amount of free intra-abdominal air. In the absence of recent surgery, this appearance would be worrisome for bowel perforation. Abdominal wall musculature appears intact. Musculoskeletal: No destructive bone lesions. IMPRESSION: Mildly dilated fluid-filled small bowel with diffuse small bowel and colonic wall thickening. Diffuse abdominal ascites. Possible small amount of free air. Bowel changes could be due to ascites but bowel ischemia, infectious or inflammatory enterocolitis could also be considered. The presence of small amount of free air is worrisome for bowel perforation. Laboratory correlation is suggested. Small bilateral pleural effusions with basilar atelectasis. Emphysematous changes in the lung bases. Probable gallbladder sludge. These results were called by telephone at the time of interpretation on 12/10/2016 at 2:40 am to Dr. Corrie Dandy, who verbally acknowledged these results. Electronically Signed   By: Lucienne Capers M.D.   On: 12/10/2016 02:53   Ct Angio Head W Or Wo Contrast  Result Date: 12/09/2016 CLINICAL DATA:  LEFT-sided deficit, subsequently reversed with improvement in hypotension. History of multiple sclerosis. History of breast cancer. EXAM: CT ANGIOGRAPHY HEAD AND NECK TECHNIQUE: Multidetector CT imaging of the head and neck was performed using the standard protocol during bolus administration of intravenous contrast. Multiplanar CT image reconstructions and MIPs were obtained to evaluate the vascular anatomy. Carotid stenosis measurements (when applicable) are obtained utilizing NASCET criteria, using the distal internal carotid diameter as the denominator. CONTRAST:  CONTRAST 50 mL Isovue  370. COMPARISON:  CT head earlier today. FINDINGS: CTA NECK Aortic arch: Standard branching. Imaged portion shows no evidence of aneurysm or dissection. No significant stenosis of the major arch vessel origins. Right carotid system: No significant atheromatous disease. No evidence of dissection, stenosis (50% or greater) or occlusion. Left carotid system: No evidence of dissection, stenosis (50% or greater) or occlusion. Vertebral arteries: Codominant. Both are diminutive reflecting basilar hypoplasia. No evidence of dissection, stenosis (50% or greater) or occlusion. Nonvascular soft tissues: Thyromegaly, question multinodular goiter. No neck masses. Endotracheal tube and orogastric tube good position. COPD. RIGHT pleural effusion. CTA HEAD Anterior circulation: The internal carotid arteries, anterior cerebral arteries, and middle cerebral arteries are widely patent. No significant stenosis, proximal occlusion, aneurysm, or vascular malformation. Posterior circulation: Diminutive. Fetal origin BILATERAL PCA. Both vertebrals contribute to basilar formation. No significant stenosis, proximal occlusion, aneurysm, or vascular malformation. Venous sinuses: As permitted by contrast timing, patent. Anatomic variants: BILATERAL fetal PCA. Delayed phase:   No abnormal intracranial enhancement. IMPRESSION: No intracranial or extracranial stenosis of significance. 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 COPD, with RIGHT pleural effusion. Electronically Signed   By: Staci Righter M.D.   On: 12/09/2016 16:01   Ct Angio Neck W Or Wo Contrast  Result Date: 12/09/2016 CLINICAL DATA:  LEFT-sided deficit, subsequently reversed with  improvement in hypotension. History of multiple sclerosis. History of breast cancer. EXAM: CT ANGIOGRAPHY HEAD AND NECK TECHNIQUE: Multidetector CT imaging of the head and neck was performed using the standard protocol during bolus administration of intravenous contrast.  Multiplanar CT image reconstructions and MIPs were obtained to evaluate the vascular anatomy. Carotid stenosis measurements (when applicable) are obtained utilizing NASCET criteria, using the distal internal carotid diameter as the denominator. CONTRAST:  CONTRAST 50 mL Isovue 370. COMPARISON:  CT head earlier today. FINDINGS: CTA NECK Aortic arch: Standard branching. Imaged portion shows no evidence of aneurysm or dissection. No significant stenosis of the major arch vessel origins. Right carotid system: No significant atheromatous disease. No evidence of dissection, stenosis (50% or greater) or occlusion. Left carotid system: No evidence of dissection, stenosis (50% or greater) or occlusion. Vertebral arteries: Codominant. Both are diminutive reflecting basilar hypoplasia. No evidence of dissection, stenosis (50% or greater) or occlusion. Nonvascular soft tissues: Thyromegaly, question multinodular goiter. No neck masses. Endotracheal tube and orogastric tube good position. COPD. RIGHT pleural effusion. CTA HEAD Anterior circulation: The internal carotid arteries, anterior cerebral arteries, and middle cerebral arteries are widely patent. No significant stenosis, proximal occlusion, aneurysm, or vascular malformation. Posterior circulation: Diminutive. Fetal origin BILATERAL PCA. Both vertebrals contribute to basilar formation. No significant stenosis, proximal occlusion, aneurysm, or vascular malformation. Venous sinuses: As permitted by contrast timing, patent. Anatomic variants: BILATERAL fetal PCA. Delayed phase:   No abnormal intracranial enhancement. IMPRESSION: No intracranial or extracranial stenosis of significance. 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 COPD, with RIGHT pleural effusion. Electronically Signed   By: Staci Righter M.D.   On: 12/09/2016 16:01   Mr Brain Wo Contrast  Result Date: 12/10/2016 CLINICAL DATA:  Altered mental status, LEFT-sided deficits. Follow-up code  stroke. History of breast cancer and multiple sclerosis. EXAM: MRI HEAD WITHOUT CONTRAST TECHNIQUE: Multiplanar, multiecho pulse sequences of the brain and surrounding structures were obtained without intravenous contrast. COMPARISON:  CT HEAD Dec 09, 2016 and MRI of the head February 15, 2015 FINDINGS: Moderately motion degraded examination. BRAIN: No reduced diffusion to suggest acute ischemia. No susceptibility artifact to suggest hemorrhage. Mild global parenchymal brain volume loss for age, disproportionate vermian atrophy, unchanged. Confluent supratentorial white matter T2 hyperintensities radiating from the periventricular margin stable from prior MRI. Stable RIGHT cerebellar and LEFT pontine lesions. RIGHT and possibly LEFT thalamus stable T2 hyperintensities. Lesions demonstrate low T1 signal most compatible with black holes of demyelination. No midline shift, mass effect or masses. No abnormal extra-axial fluid collections. VASCULAR: Normal major intracranial vascular flow voids present at skull base. SKULL AND UPPER CERVICAL SPINE: No abnormal sellar expansion. No suspicious calvarial bone marrow signal. Craniocervical junction maintained. SINUSES/ORBITS: The mastoid air-cells and included paranasal sinuses are well-aerated. The included ocular globes and orbital contents are non-suspicious. OTHER: Patient is edentulous. IMPRESSION: No acute intracranial process on this motion degraded examination. Stable examination including severe chronic demyelination and mild parenchymal brain volume loss for age, disproportionately affecting the vermis. Electronically Signed   By: Elon Alas M.D.   On: 12/10/2016 03:45   Dg Chest Port 1 View  Result Date: 12/11/2016 CLINICAL DATA:  Follow-up respiratory failure EXAM: PORTABLE CHEST 1 VIEW COMPARISON:  04/13/2017 FINDINGS: Cardiac shadow is stable. Left subclavian central line, nasogastric catheter and endotracheal tube are again seen and stable. Bibasilar  atelectatic changes are again seen and stable. No new focal abnormality is seen. IMPRESSION: Stable bibasilar changes. Tubes and lines as described above. Electronically Signed  By: Inez Catalina M.D.   On: 12/11/2016 07:53   Dg Chest Port 1 View  Result Date: 12/10/2016 CLINICAL DATA:  Central line placement, multiple sclerosis EXAM: PORTABLE CHEST 1 VIEW COMPARISON:  Portable exam 1638 hours compared to 12/10/2016 FINDINGS: Tip of endotracheal tube projects 4.2 cm above carina. Nasogastric tube extends into stomach. LEFT subclavian central venous catheter with tip projecting over cavoatrial junction. Rotated to the LEFT. Stable heart size and mediastinal contours. Persistent RIGHT basilar atelectasis. Persistent atelectasis versus consolidation LEFT lower lobe. Skin folds project over upper RIGHT chest. Upper lungs clear without pneumothorax. IMPRESSION: Persistent LEFT lower lobe atelectasis versus consolidation and mild RIGHT basilar atelectasis. New LEFT subclavian central venous catheter without pneumothorax. Electronically Signed   By: Lavonia Dana M.D.   On: 12/10/2016 16:47   Dg Chest Port 1 View  Result Date: 12/10/2016 CLINICAL DATA:  Status post intubation. EXAM: PORTABLE CHEST 1 VIEW COMPARISON:  Radiograph of Dec 09, 2016. FINDINGS: Stable cardiomediastinal silhouette. Endotracheal and nasogastric tubes are unchanged in position. No pneumothorax is noted. Mild bibasilar atelectasis or infiltrates are noted with possible associated minimal pleural effusions. Bony thorax is unremarkable. IMPRESSION: Mild bibasilar atelectasis or infiltrates are noted with possible associated minimal pleural effusions. Stable support apparatus. Electronically Signed   By: Marijo Conception, M.D.   On: 12/10/2016 08:19   Dg Chest Portable 1 View  Result Date: 12/09/2016 CLINICAL DATA:  Intubation. EXAM: PORTABLE CHEST 1 VIEW COMPARISON:  Nov 26, 2013 FINDINGS: An NG tube terminates below today's film. An ET tube  is in good position terminating in the mid trachea. No pneumothorax. The cardiomediastinal silhouette is stable. The left base is poorly evaluated. No acute abnormalities are seen. IMPRESSION: The ETT is in good position. The NG tube terminates below today's film. No acute abnormalities otherwise seen. Electronically Signed   By: Dorise Bullion III M.D   On: 12/09/2016 14:49   Ct Head Code Stroke W/o Cm  Result Date: 12/09/2016 CLINICAL DATA:  Code stroke. LEFT-sided deficit. History of multiple sclerosis. EXAM: CT HEAD WITHOUT CONTRAST TECHNIQUE: Contiguous axial images were obtained from the base of the skull through the vertex without intravenous contrast. COMPARISON:  MR brain 02/15/2015. FINDINGS: Brain: No evidence for acute infarction, hemorrhage, mass lesion, hydrocephalus, or extra-axial fluid. Moderate cerebral and cerebellar atrophy, premature for age. Extensive white matter signal abnormality, consistent with the diagnosis of multiple sclerosis. No definite progression from priors. Vascular: No hyperdense vessel or unexpected calcification. Skull: Normal. Negative for fracture or focal lesion. Sinuses/Orbits: No acute finding. Other: None. ASPECTS [facility] Stroke Program Early CT Score) - Ganglionic level infarction (caudate, lentiform nuclei, internal capsule, insula, M1-M3 cortex): 7 - Supraganglionic infarction (M4-M6 cortex): 3 Total score (0-10 with 10 being normal): 10 IMPRESSION: 1. Premature for age atrophy. Extensive white matter disease, correlating with the history of multiple sclerosis. No definite acute stroke. No proximal large vessel occlusion. CTA pending. 2. ASPECTS is 10. A text message with these findings was sent to the stroke neurologist at 3:25 p.m. Electronically Signed   By: Staci Righter M.D.   On: 12/09/2016 15:27     Studies: CT abd/pelvis 5/27 >> small b/l effusions, emphysema, GB sludge, diffuse SB thickening, mod ascites, small amount of free air MRI brain 5/27 >>  no acute process, severe chronic demyelination  Antibiotics: Vancomycin 5/26 >> Zosyn 5/26 >>  Cultures: Blood 5/26 >> Sputum 5/26 >> Urine 5/26 >> neg GI panel 5/26 >>  Lines/tubes: ETT 5/26 >>  Events: 5/26 Admit, neuro consulted 5/27 Neuro s/o, surgery consulted  Summary: 61 yo female with altered mental status from septic shock with concern for acute abdomen.  PMHx of dementia, MS and felt not to be candidate for immunomodulation therapy by neurology at last visit on 11/16/16.  Assessment/plan:  Acute respiratory failure hypoxia in setting of septic shock. - continue vent support - f/u CXR - VAP bundle - ABG as clinically indicated  Septic shock with lactic acidosis with concern for acute abdomen. - surgery following - ABX as above - Keep vancomycin until sputum grows out - pressors to keep MAP > 65 (currently norepi 15mcg) - Change IVF to D5 NS at 148mL/Hr - NG tube to suction - Keep NPO for now per surgery  Acute renal failure > baseline creatinine 1 from 04/05/16. - continue IV fluids - f/u BMET  Leukopenia in setting of sepsis. - f/u CBC  Acute metabolic encephalopathy. Hx of dementia, MS. - RASS goal -1  Hyperglycemia- some episodes oh hypoglycemia - SSI - add Dextrose to IVF  DVT prophylaxis - SCDs/SQ heparin SUP - Protonix Nutrition - NPO Goals of care - full code   Needs iPAL meeting by 6/2  CC time 35 mins  Georgann Housekeeper, AGACNP-BC [facility] Pulmonology/Critical Care Pager [phone] or [phone]  12/11/2016 11:16 AM

## 2016-12-11 NOTE — Progress Notes (Signed)
  Echocardiogram 2D Echocardiogram has been performed.  Keyna Blizard T Tori Cupps 12/11/2016, 1:53 PM

## 2016-12-12 ENCOUNTER — Inpatient Hospital Stay (HOSPITAL_COMMUNITY): Payer: Medicare Other

## 2016-12-12 ENCOUNTER — Encounter (HOSPITAL_COMMUNITY): Payer: Self-pay | Admitting: General Practice

## 2016-12-12 DIAGNOSIS — K659 Peritonitis, unspecified: Secondary | ICD-10-CM

## 2016-12-12 DIAGNOSIS — G35 Multiple sclerosis: Secondary | ICD-10-CM

## 2016-12-12 DIAGNOSIS — R531 Weakness: Secondary | ICD-10-CM

## 2016-12-12 LAB — GLUCOSE, CAPILLARY
GLUCOSE-CAPILLARY: 64 mg/dL — AB (ref 65–99)
GLUCOSE-CAPILLARY: 70 mg/dL (ref 65–99)
GLUCOSE-CAPILLARY: 87 mg/dL (ref 65–99)
GLUCOSE-CAPILLARY: 84 mg/dL (ref 65–99)
Glucose-Capillary: 110 mg/dL — ABNORMAL HIGH (ref 65–99)
Glucose-Capillary: 90 mg/dL (ref 65–99)
Glucose-Capillary: 93 mg/dL (ref 65–99)

## 2016-12-12 LAB — CULTURE, RESPIRATORY

## 2016-12-12 LAB — CBC
HEMATOCRIT: 31.1 % — AB (ref 36.0–46.0)
HEMOGLOBIN: 10.5 g/dL — AB (ref 12.0–15.0)
MCH: 30.5 pg (ref 26.0–34.0)
MCHC: 33.8 g/dL (ref 30.0–36.0)
MCV: 90.4 fL (ref 78.0–100.0)
Platelets: 128 10*3/uL — ABNORMAL LOW (ref 150–400)
RBC: 3.44 MIL/uL — ABNORMAL LOW (ref 3.87–5.11)
RDW: 13.9 % (ref 11.5–15.5)
WBC: 13.4 10*3/uL — AB (ref 4.0–10.5)

## 2016-12-12 LAB — CULTURE, RESPIRATORY W GRAM STAIN

## 2016-12-12 LAB — BASIC METABOLIC PANEL
ANION GAP: 6 (ref 5–15)
BUN: 19 mg/dL (ref 6–20)
CALCIUM: 6.9 mg/dL — AB (ref 8.9–10.3)
CO2: 20 mmol/L — ABNORMAL LOW (ref 22–32)
Chloride: 105 mmol/L (ref 101–111)
Creatinine, Ser: 0.92 mg/dL (ref 0.44–1.00)
GFR calc Af Amer: >60 mL/min (ref >60)
GLUCOSE: 103 mg/dL — AB (ref 65–99)
POTASSIUM: 3.3 mmol/L — AB (ref 3.5–5.1)
Sodium: 131 mmol/L — ABNORMAL LOW (ref 135–145)

## 2016-12-12 LAB — PHOSPHORUS: PHOSPHORUS: 2.6 mg/dL (ref 2.5–4.6)

## 2016-12-12 LAB — TRIGLYCERIDES: TRIGLYCERIDES: 106 mg/dL (ref <150)

## 2016-12-12 LAB — MAGNESIUM: MAGNESIUM: 2.3 mg/dL (ref 1.7–2.4)

## 2016-12-12 MED ORDER — SODIUM CHLORIDE 0.9 % IV SOLN
3.0000 g | Freq: Four times a day (QID) | INTRAVENOUS | Status: DC
  Administered 2016-12-12 – 2016-12-16 (×17): 3 g via INTRAVENOUS
  Filled 2016-12-12 (×17): qty 3

## 2016-12-12 MED ORDER — SODIUM CHLORIDE 0.9 % IV SOLN
1.0000 g | Freq: Once | INTRAVENOUS | Status: AC
  Administered 2016-12-12: 1 g via INTRAVENOUS
  Filled 2016-12-12: qty 10

## 2016-12-12 MED ORDER — POTASSIUM CHLORIDE 10 MEQ/50ML IV SOLN
10.0000 meq | INTRAVENOUS | Status: AC
  Administered 2016-12-12 (×2): 10 meq via INTRAVENOUS
  Filled 2016-12-12 (×2): qty 50

## 2016-12-12 MED ORDER — DEXTROSE 50 % IV SOLN
INTRAVENOUS | Status: AC
  Administered 2016-12-12: 25 mL
  Filled 2016-12-12: qty 50

## 2016-12-12 NOTE — Progress Notes (Signed)
Pharmacy Antibiotic Note  Melissa Oconnell is a 61 y.o. female admitted on 12/09/2016 with Klebsiella pneumonia.  Pharmacy has been consulted for Zosyn dosing.  Plan: After discussion with Dr. Lake Bells, antibiotics will be changed to Unasyn 3g IV q6h.  Height: 5\' 8"  (172.7 cm) Weight: 129 lb 10.1 oz (58.8 kg) IBW/kg (Calculated) : 63.9  Temp (24hrs), Avg:98.4 F (36.9 C), Min:97.6 F (36.4 C), Max:99.2 F (37.3 C)   Recent Labs Lab 12/09/16 1415 12/09/16 1420 12/09/16 1735 12/09/16 1901 12/09/16 2152 12/10/16 0005 12/10/16 0304 12/11/16 0415 12/12/16 0310  WBC 4.7  --   --  1.6*  --   --  2.0* 7.8 13.4*  CREATININE 1.50* 1.30*  --  1.45*  --   --  1.34* 1.01* 0.92  LATICACIDVEN  --   --  4.97*  --  5.8* 5.3*  --   --   --     Estimated Creatinine Clearance: 60.4 mL/min (by C-G formula based on SCr of 0.92 mg/dL).    No Known Allergies  Antimicrobials this admission: Vanc 5/26>>5/29 Zosyn 5/26>>5/29 Unasyn 5/29  Dose adjustments this admission:   Microbiology results: 5/26 MRSA - NEG 5/26 Urine - NEG 5/26 Blood - NGTD 5/27 TA - rare Kleb pneumoniae - R to Amp, otherwise pan sensitive  Thank you for allowing pharmacy to be a part of this patient's care.  Sloan Leiter, PharmD, BCPS Clinical Pharmacist Clinical phone 12/12/2016 until 3:30 PM - [phone] After hours, please call [phone] 12/12/2016 2:02 PM

## 2016-12-12 NOTE — Progress Notes (Signed)
Subjective: Severe diffuse weakness, right worse than left, on today's exam by ICU attending. Unable to safely extubate due to severe weakness. Neurology was called to re-evaluate regarding possible MS exacerbation. The patient communicates via mouthing and nodding/shaking her head, indicating that she feels that her weakness is due to an MS exacerbation. She is amenable to steroid treatment.   Objective: Current vital signs: BP (!) 90/56   Pulse 94   Temp 97.6 F (36.4 C) (Oral)   Resp 19   Ht 5' 8" (1.727 m)   Wt 58.8 kg (129 lb 10.1 oz)   SpO2 100%   BMI 19.71 kg/m  Vital signs in last 24 hours: Temp:  [97.6 F (36.4 C)-99.2 F (37.3 C)] 97.6 F (36.4 C) (05/29 1151) Pulse Rate:  [90-116] 94 (05/29 1345) Resp:  [14-24] 19 (05/29 1345) BP: (78-127)/(53-88) 90/56 (05/29 1345) SpO2:  [100 %] 100 % (05/29 1345) FiO2 (%):  [30 %] 30 % (05/29 1212) Weight:  [58.8 kg (129 lb 10.1 oz)] 58.8 kg (129 lb 10.1 oz) (05/29 0303)  Intake/Output from previous day: 05/28 0701 - 05/29 0700 In: 3259.5 [I.V.:2769.5; IV Piggyback:300] Out: 1140 [Urine:945; Drains:195] Intake/Output this shift: Total I/O In: 824.1 [I.V.:614.1; IV Piggyback:210] Out: 260 [Urine:260] Nutritional status: Diet NPO time specified  Neurologic Exam: Ment: Awake and alert. Nods head correctly to orientation questions. Able to follow all commands. CN: EOMI with saccadic quality of visual pursuits. Occasional nystagmus noted. Pupils small at 2 mm, difficult to discern reactivity to light. Visual fields intact. Facial motor function grossly symmetric in the context of intubation.  Motor: 4-/5 bilateral upper extremities, worse proximally and slightly weaker on the right. Increased, spastic tone bilateral upper extremities.  3/5 bilateral lower extremities to hip flexion. 3/5 right knee extension. 4/5 left knee extension. Spastic tone bilaterally.  Sensory: Able to perceive FT bilateral upper and lower extremities without  extinction.  Reflexes: Brisk, low-amplitude 3+ reflexes biceps and patellae bilaterally.  Cerebellar: Coarse ataxia bilateral upper extremities.  Gait: Unable to assess.   Lab Results: Results for orders placed or performed during the hospital encounter of 12/09/16 (from the past 48 hour(s))  Glucose, capillary     Status: None   Collection Time: 12/10/16  5:06 PM  Result Value Ref Range   Glucose-Capillary 97 65 - 99 mg/dL   Comment 1 Capillary Specimen    Comment 2 Notify RN   Glucose, capillary     Status: Abnormal   Collection Time: 12/10/16  8:10 PM  Result Value Ref Range   Glucose-Capillary 136 (H) 65 - 99 mg/dL   Comment 1 Capillary Specimen   Glucose, capillary     Status: None   Collection Time: 12/10/16 11:32 PM  Result Value Ref Range   Glucose-Capillary 81 65 - 99 mg/dL   Comment 1 Capillary Specimen   Blood gas, arterial     Status: Abnormal   Collection Time: 12/11/16  3:10 AM  Result Value Ref Range   FIO2 40.00    Delivery systems VENTILATOR    Mode PRESSURE REGULATED VOLUME CONTROL    VT 500 mL   LHR 14 resp/min   Peep/cpap 5.0 cm H20   pH, Arterial 7.415 7.350 - 7.450   pCO2 arterial 25.8 (L) 32.0 - 48.0 mmHg   pO2, Arterial 107 83.0 - 108.0 mmHg   Bicarbonate 16.2 (L) 20.0 - 28.0 mmol/L   Acid-base deficit 7.5 (H) 0.0 - 2.0 mmol/L   O2 Saturation 97.6 %   Patient   temperature 98.6    Collection site LEFT RADIAL    Drawn by [id number]    Sample type ARTERIAL DRAW    Allens test (pass/fail) PASS PASS  Glucose, capillary     Status: Abnormal   Collection Time: 12/11/16  4:13 AM  Result Value Ref Range   Glucose-Capillary 40 (LL) 65 - 99 mg/dL   Comment 1 Capillary Specimen   Comprehensive metabolic panel     Status: Abnormal   Collection Time: 12/11/16  4:15 AM  Result Value Ref Range   Sodium 130 (L) 135 - 145 mmol/L   Potassium 4.1 3.5 - 5.1 mmol/L   Chloride 105 101 - 111 mmol/L   CO2 18 (L) 22 - 32 mmol/L   Glucose, Bld 50 (L) 65 - 99 mg/dL    BUN 17 6 - 20 mg/dL   Creatinine, Ser 1.01 (H) 0.44 - 1.00 mg/dL   Calcium 7.1 (L) 8.9 - 10.3 mg/dL   Total Protein 4.5 (L) 6.5 - 8.1 g/dL   Albumin 2.0 (L) 3.5 - 5.0 g/dL   AST 64 (H) 15 - 41 U/L   ALT 33 14 - 54 U/L   Alkaline Phosphatase 50 38 - 126 U/L   Total Bilirubin 0.6 0.3 - 1.2 mg/dL   GFR calc non Af Amer 59 (L) >60 mL/min   GFR calc Af Amer >60 >60 mL/min    Comment: (NOTE) The eGFR has been calculated using the CKD EPI equation. This calculation has not been validated in all clinical situations. eGFR's persistently <60 mL/min signify possible Chronic Kidney Disease.    Anion gap 7 5 - 15  CBC     Status: None   Collection Time: 12/11/16  4:15 AM  Result Value Ref Range   WBC 7.8 4.0 - 10.5 K/uL   RBC 4.28 3.87 - 5.11 MIL/uL   Hemoglobin 13.0 12.0 - 15.0 g/dL   HCT 38.8 36.0 - 46.0 %   MCV 90.7 78.0 - 100.0 fL   MCH 30.4 26.0 - 34.0 pg   MCHC 33.5 30.0 - 36.0 g/dL   RDW 13.5 11.5 - 15.5 %   Platelets 187 150 - 400 K/uL  Magnesium     Status: None   Collection Time: 12/11/16  4:15 AM  Result Value Ref Range   Magnesium 2.4 1.7 - 2.4 mg/dL  Phosphorus     Status: None   Collection Time: 12/11/16  4:15 AM  Result Value Ref Range   Phosphorus 4.0 2.5 - 4.6 mg/dL  Glucose, capillary     Status: Abnormal   Collection Time: 12/11/16  4:40 AM  Result Value Ref Range   Glucose-Capillary 211 (H) 65 - 99 mg/dL   Comment 1 Capillary Specimen   Glucose, capillary     Status: None   Collection Time: 12/11/16  7:51 AM  Result Value Ref Range   Glucose-Capillary 68 65 - 99 mg/dL   Comment 1 Capillary Specimen    Comment 2 Notify RN   Glucose, capillary     Status: Abnormal   Collection Time: 12/11/16  8:45 AM  Result Value Ref Range   Glucose-Capillary 64 (L) 65 - 99 mg/dL  Glucose, capillary     Status: Abnormal   Collection Time: 12/11/16 10:07 AM  Result Value Ref Range   Glucose-Capillary 120 (H) 65 - 99 mg/dL   Comment 1 Capillary Specimen   Glucose,  capillary     Status: None   Collection Time: 12/11/16 11:37 AM  Result   Value Ref Range   Glucose-Capillary 99 65 - 99 mg/dL   Comment 1 Capillary Specimen    Comment 2 Notify RN   Glucose, capillary     Status: None   Collection Time: 12/11/16  3:39 PM  Result Value Ref Range   Glucose-Capillary 96 65 - 99 mg/dL   Comment 1 Capillary Specimen    Comment 2 Notify RN   Glucose, capillary     Status: Abnormal   Collection Time: 12/11/16  7:59 PM  Result Value Ref Range   Glucose-Capillary 103 (H) 65 - 99 mg/dL   Comment 1 Document in Chart   Glucose, capillary     Status: Abnormal   Collection Time: 12/11/16 11:29 PM  Result Value Ref Range   Glucose-Capillary 100 (H) 65 - 99 mg/dL   Comment 1 Document in Chart   CBC     Status: Abnormal   Collection Time: 12/12/16  3:10 AM  Result Value Ref Range   WBC 13.4 (H) 4.0 - 10.5 K/uL   RBC 3.44 (L) 3.87 - 5.11 MIL/uL   Hemoglobin 10.5 (L) 12.0 - 15.0 g/dL   HCT 31.1 (L) 36.0 - 46.0 %   MCV 90.4 78.0 - 100.0 fL   MCH 30.5 26.0 - 34.0 pg   MCHC 33.8 30.0 - 36.0 g/dL   RDW 13.9 11.5 - 15.5 %   Platelets 128 (L) 150 - 400 K/uL  Magnesium     Status: None   Collection Time: 12/12/16  3:10 AM  Result Value Ref Range   Magnesium 2.3 1.7 - 2.4 mg/dL  Phosphorus     Status: None   Collection Time: 12/12/16  3:10 AM  Result Value Ref Range   Phosphorus 2.6 2.5 - 4.6 mg/dL  Basic metabolic panel     Status: Abnormal   Collection Time: 12/12/16  3:10 AM  Result Value Ref Range   Sodium 131 (L) 135 - 145 mmol/L   Potassium 3.3 (L) 3.5 - 5.1 mmol/L    Comment: DELTA CHECK NOTED   Chloride 105 101 - 111 mmol/L   CO2 20 (L) 22 - 32 mmol/L   Glucose, Bld 103 (H) 65 - 99 mg/dL   BUN 19 6 - 20 mg/dL   Creatinine, Ser 0.92 0.44 - 1.00 mg/dL   Calcium 6.9 (L) 8.9 - 10.3 mg/dL   GFR calc non Af Amer >60 >60 mL/min   GFR calc Af Amer >60 >60 mL/min    Comment: (NOTE) The eGFR has been calculated using the CKD EPI equation. This calculation  has not been validated in all clinical situations. eGFR's persistently <60 mL/min signify possible Chronic Kidney Disease.    Anion gap 6 5 - 15  Glucose, capillary     Status: Abnormal   Collection Time: 12/12/16  3:19 AM  Result Value Ref Range   Glucose-Capillary 110 (H) 65 - 99 mg/dL   Comment 1 QC Due    Comment 2 Notify RN   Glucose, capillary     Status: None   Collection Time: 12/12/16  7:49 AM  Result Value Ref Range   Glucose-Capillary 90 65 - 99 mg/dL   Comment 1 Capillary Specimen   Glucose, capillary     Status: Abnormal   Collection Time: 12/12/16 11:43 AM  Result Value Ref Range   Glucose-Capillary 64 (L) 65 - 99 mg/dL   Comment 1 Notify RN    Comment 2 Repeat Test   Glucose, capillary     Status:   None   Collection Time: 12/12/16 11:45 AM  Result Value Ref Range   Glucose-Capillary 70 65 - 99 mg/dL   Comment 1 Capillary Specimen     Recent Results (from the past 240 hour(s))  Blood Culture (routine x 2)     Status: None (Preliminary result)   Collection Time: 12/09/16  5:17 PM  Result Value Ref Range Status   Specimen Description BLOOD LEFT HAND  Final   Special Requests   Final    Blood Culture adequate volume BOTTLES DRAWN AEROBIC ONLY   Culture NO GROWTH 2 DAYS  Final   Report Status PENDING  Incomplete  Urine culture     Status: None   Collection Time: 12/09/16  7:00 PM  Result Value Ref Range Status   Specimen Description URINE, CATHETERIZED  Final   Special Requests NONE  Final   Culture NO GROWTH  Final   Report Status 12/10/2016 FINAL  Final  Culture, blood (routine x 2)     Status: None (Preliminary result)   Collection Time: 12/09/16  7:01 PM  Result Value Ref Range Status   Specimen Description BLOOD LEFT ARM  Final   Special Requests IN PEDIATRIC BOTTLE Blood Culture adequate volume  Final   Culture NO GROWTH 2 DAYS  Final   Report Status PENDING  Incomplete  Culture, blood (routine x 2)     Status: None (Preliminary result)   Collection  Time: 12/09/16  7:01 PM  Result Value Ref Range Status   Specimen Description BLOOD LEFT HAND  Final   Special Requests IN PEDIATRIC BOTTLE Blood Culture adequate volume  Final   Culture NO GROWTH 2 DAYS  Final   Report Status PENDING  Incomplete  MRSA PCR Screening     Status: None   Collection Time: 12/09/16  7:03 PM  Result Value Ref Range Status   MRSA by PCR NEGATIVE NEGATIVE Final    Comment:        The GeneXpert MRSA Assay (FDA approved for NASAL specimens only), is one component of a comprehensive MRSA colonization surveillance program. It is not intended to diagnose MRSA infection nor to guide or monitor treatment for MRSA infections.   Culture, respiratory (tracheal aspirate)     Status: None (Preliminary result)   Collection Time: 12/10/16 12:05 PM  Result Value Ref Range Status   Specimen Description TRACHEAL ASPIRATE  Final   Special Requests NONE  Final   Gram Stain   Final    FEW WBC PRESENT,BOTH PMN AND MONONUCLEAR ABUNDANT GRAM POSITIVE COCCI MODERATE BUDDING YEAST SEEN YEAST WITH PSEUDOHYPHAE FEW GRAM NEGATIVE RODS FEW GRAM NEGATIVE COCCOBACILLI    Culture   Final    FEW YEAST RARE KLEBSIELLA PNEUMONIAE IDENTIFICATION TO FOLLOW    Report Status PENDING  Incomplete   Organism ID, Bacteria KLEBSIELLA PNEUMONIAE  Final      Susceptibility   Klebsiella pneumoniae - MIC*    AMPICILLIN RESISTANT Resistant     CEFAZOLIN <=4 SENSITIVE Sensitive     CEFEPIME <=1 SENSITIVE Sensitive     CEFTAZIDIME <=1 SENSITIVE Sensitive     CEFTRIAXONE <=1 SENSITIVE Sensitive     CIPROFLOXACIN <=0.25 SENSITIVE Sensitive     GENTAMICIN <=1 SENSITIVE Sensitive     IMIPENEM <=0.25 SENSITIVE Sensitive     TRIMETH/SULFA <=20 SENSITIVE Sensitive     AMPICILLIN/SULBACTAM 4 SENSITIVE Sensitive     PIP/TAZO <=4 SENSITIVE Sensitive     Extended ESBL NEGATIVE Sensitive     * RARE KLEBSIELLA  PNEUMONIAE    Lipid Panel No results for input(s): CHOL, TRIG, HDL, CHOLHDL, VLDL,  LDLCALC in the last 72 hours.  Studies/Results: Dg Chest Port 1 View  Result Date: 12/12/2016 CLINICAL DATA:  Respiratory failure. EXAM: PORTABLE CHEST 1 VIEW COMPARISON:  12/11/2016. FINDINGS: Endotracheal tube, NG tube, left subclavian line in good anatomic position. Heart size stable. Persistent bibasilar atelectasis/infiltrates. Small left pleural effusion. No pneumothorax. Surgical clips right chest. IMPRESSION: 1.  Lines and tubes in stable position. 2. Persistent bibasilar atelectasis/ infiltrate. Small left pleural effusion. No significant change . Electronically Signed   By: Marcello Moores  Register   On: 12/12/2016 06:34   Dg Chest Port 1 View  Result Date: 12/11/2016 CLINICAL DATA:  Follow-up respiratory failure EXAM: PORTABLE CHEST 1 VIEW COMPARISON:  04/13/2017 FINDINGS: Cardiac shadow is stable. Left subclavian central line, nasogastric catheter and endotracheal tube are again seen and stable. Bibasilar atelectatic changes are again seen and stable. No new focal abnormality is seen. IMPRESSION: Stable bibasilar changes. Tubes and lines as described above. Electronically Signed   By: Inez Catalina M.D.   On: 12/11/2016 07:53   Dg Chest Port 1 View  Result Date: 12/10/2016 CLINICAL DATA:  Central line placement, multiple sclerosis EXAM: PORTABLE CHEST 1 VIEW COMPARISON:  Portable exam 1638 hours compared to 12/10/2016 FINDINGS: Tip of endotracheal tube projects 4.2 cm above carina. Nasogastric tube extends into stomach. LEFT subclavian central venous catheter with tip projecting over cavoatrial junction. Rotated to the LEFT. Stable heart size and mediastinal contours. Persistent RIGHT basilar atelectasis. Persistent atelectasis versus consolidation LEFT lower lobe. Skin folds project over upper RIGHT chest. Upper lungs clear without pneumothorax. IMPRESSION: Persistent LEFT lower lobe atelectasis versus consolidation and mild RIGHT basilar atelectasis. New LEFT subclavian central venous catheter  without pneumothorax. Electronically Signed   By: Lavonia Dana M.D.   On: 12/10/2016 16:47    Medications:  Scheduled: . chlorhexidine gluconate (MEDLINE KIT)  15 mL Mouth Rinse BID  . Chlorhexidine Gluconate Cloth  6 each Topical Once  . heparin  5,000 Units Subcutaneous Q8H  . insulin aspart  2-6 Units Subcutaneous Q4H  . mouth rinse  15 mL Mouth Rinse QID  . pantoprazole (PROTONIX) IV  40 mg Intravenous Q12H    Impression: 61 year old female with focal neurological findings in the setting of severe hypotension on initial evaluation by Neurology in the ED. The focal deficits had all resolved with resolution of the hypotension. Now with diffuse weakness, right worse than left, in addition to neck weakness, though possibly to be secondary to MS exacerbation.  1. MRI brain performed on 5/27 revealed no acute intracranial process on the motion degraded examination. Chronic findings consisting of severe chronic demyelination and mild parenchymal brain volume loss for age, disproportionately affecting the vermis, were noted. 2. Hypocalcemia may be causing mild tetany, including constriction of pharyngeal muscles, which can lead to difficulty breathing.  3. Mild hyponatremia. May be contributing to her diffuse weakness.  4. Decreased neurological reserve likely based upon appearance of her brain MRI, with white matter, cortical and subcortical atrophy. This would predispose to diffuse weakness in the setting of intercurrent infection or postoperative state.   Recommendations: 1. Correct electrolyte disturbances and reassess strength.  2. OOB to chair if possible, even while intubated, to prevent deconditioning and improve the chances of eventual successful intubation.  3. PT/OT. 4. Clinically does not appear consistent with an MS exacerbation. If still weak after above measures, call Neurology to re-evaluate and reconsider pulsed-dose steroids.  LOS: 3 days   @Electronically signed: Dr.   @ 12/12/2016  2:24 PM   

## 2016-12-12 NOTE — Clinical Social Work Note (Addendum)
CSW was consulted to demonstration of home care wound ostomy. However, this is not an appropriate consult for CSW.   [place], [place]

## 2016-12-12 NOTE — Progress Notes (Signed)
2 Days Post-Op    CC:AMS/ free intraabdominal air  Subjective: Pt on vent, but alert and responds to questions. Still in Mittens.  No BS, Wound vac in place.   No BS.  Stool in the ostomy bag. Drain is serous fluid and it is clear.    Objective: Vital signs in last 24 hours: Temp:  [98.1 F (36.7 C)-99.2 F (37.3 C)] 98.1 F (36.7 C) (05/29 0751) Pulse Rate:  [98-116] 98 (05/29 0900) Resp:  [14-119] 18 (05/29 0900) BP: (67-123)/(25-88) 99/70 (05/29 0900) SpO2:  [100 %] 100 % (05/29 0900) FiO2 (%):  [30 %] 30 % (05/29 0806) Weight:  [58.8 kg (129 lb 10.1 oz)] 58.8 kg (129 lb 10.1 oz) (05/29 0303) Last BM Date: 12/12/16 3259 IV 945 urine 195 drain Stool x 3 recorded. Afebrile, VSS Na 131, K+ 3.3 WBC is up some CXR:  Persistent bibasilar atelectasis/ infiltrate. Small left pleural effusion. No significant change .   Intake/Output from previous day: 05/28 0701 - 05/29 0700 In: 3259.5 [I.V.:2769.5; IV Piggyback:300] Out: 1140 [Urine:945; Drains:195] Intake/Output this shift: Total I/O In: 204 [I.V.:104; IV Piggyback:100] Out: 100 [Urine:100]  General appearance: alert and still on ventilator, some pressor, with mittens in place. GI: soft, no distension, No BS, stool in ostomy bag.    Lab Results:   Recent Labs  12/11/16 0415 12/12/16 0310  WBC 7.8 13.4*  HGB 13.0 10.5*  HCT 38.8 31.1*  PLT 187 128*    BMET  Recent Labs  12/11/16 0415 12/12/16 0310  NA 130* 131*  K 4.1 3.3*  CL 105 105  CO2 18* 20*  GLUCOSE 50* 103*  BUN 17 19  CREATININE 1.01* 0.92  CALCIUM 7.1* 6.9*   PT/INR  Recent Labs  12/09/16 1415  LABPROT 15.7*  INR 1.24     Recent Labs Lab 12/09/16 1415 12/11/16 0415  AST 36 64*  ALT 11* 33  ALKPHOS 60 50  BILITOT 0.7 0.6  PROT 6.0* 4.5*  ALBUMIN 2.9* 2.0*     Lipase     Component Value Date/Time   LIPASE 30 05/29/2007 1535     Medications: . chlorhexidine gluconate (MEDLINE KIT)  15 mL Mouth Rinse BID  .  Chlorhexidine Gluconate Cloth  6 each Topical Once  . heparin  5,000 Units Subcutaneous Q8H  . insulin aspart  2-6 Units Subcutaneous Q4H  . mouth rinse  15 mL Mouth Rinse QID  . pantoprazole (PROTONIX) IV  40 mg Intravenous Q12H   . dextrose 5 % and 0.9% NaCl 100 mL/hr at 12/12/16 0907  . norepinephrine (LEVOPHED) Adult infusion 3 mcg/min (12/12/16 0803)  . piperacillin-tazobactam (ZOSYN)  IV 3.375 g (12/12/16 0757)  . vancomycin 500 mg (12/12/16 0930)  . vasopressin (PITRESSIN) infusion - *FOR SHOCK* Stopped (12/11/16 1648)   Anti-infectives    Start     Dose/Rate Route Frequency Ordered Stop   12/11/16 2230  vancomycin (VANCOCIN) 500 mg in sodium chloride 0.9 % 100 mL IVPB     500 mg 100 mL/hr over 60 Minutes Intravenous Every 12 hours 12/11/16 1118     12/11/16 1030  vancomycin (VANCOCIN) 500 mg in sodium chloride 0.9 % 100 mL IVPB  Status:  Discontinued     500 mg 100 mL/hr over 60 Minutes Intravenous Every 12 hours 12/11/16 0944 12/11/16 1115   12/10/16 1700  vancomycin (VANCOCIN) IVPB 750 mg/150 ml premix  Status:  Discontinued     750 mg 150 mL/hr over 60 Minutes Intravenous Every 12  hours 12/09/16 1532 12/10/16 1025   12/10/16 1700  vancomycin (VANCOCIN) IVPB 750 mg/150 ml premix  Status:  Discontinued     750 mg 150 mL/hr over 60 Minutes Intravenous Every 24 hours 12/10/16 1025 12/11/16 0944   12/10/16 0000  piperacillin-tazobactam (ZOSYN) IVPB 3.375 g     3.375 g 12.5 mL/hr over 240 Minutes Intravenous Every 8 hours 12/09/16 1532     12/09/16 1530  piperacillin-tazobactam (ZOSYN) IVPB 3.375 g     3.375 g 100 mL/hr over 30 Minutes Intravenous  Once 12/09/16 1520 12/09/16 1659   12/09/16 1530  vancomycin (VANCOCIN) IVPB 1000 mg/200 mL premix     1,000 mg 200 mL/hr over 60 Minutes Intravenous  Once 12/09/16 1520 12/09/16 1801     Assessment/Plan Perforated sigmoid colon from sigmoid diverticulitis with fecal peritonitis and Hinchey 4 Exploratory laparotomy with sigmoid  colectomy and formation of colostomy with lysis of adhesions, 12/10/16, Dr. Marcello Moores [place]  POD  2 Shock secondary to sepsis Acute renal failure Leukopenia - severe sepsis Acute metabolic encephalopathy  Some history of dementia Multiple sclerosis FEN:  IV fluids/NPO ID:  Zosyn/Vancomycin 5/26 =>> day 4   DVT: Heparin  Plan:  Medical management per CCM.  I would plan to take wound vac down tomorrow, that will put her on a MWF schedule.  We will look at the wound at that time.      LOS: 3 days    Leonid Manus 12/12/2016 [phone] ]

## 2016-12-12 NOTE — Progress Notes (Signed)
[facility] ADULT ICU REPLACEMENT PROTOCOL FOR AM LAB REPLACEMENT ONLY  The patient does apply for the [facility] Adult ICU Electrolyte Replacment Protocol based on the criteria listed below:   1. Is GFR >/= 40 ml/min? Yes.    Patient's GFR today is >60 2. Is urine output >/= 0.5 ml/kg/hr for the last 6 hours? Yes.   Patient's UOP is 0.68 ml/kg/hr 3. Is BUN < 60 mg/dL? Yes.    Patient's BUN today is 19 4. Abnormal electrolyte(s): K-3.3 5. Ordered repletion with: per protocol 6. If a panic level lab has been reported, has the CCM MD in charge been notified? Yes.  .   Physician:  Dr. Mariane Masters, Philis Nettle 12/12/2016 5:26 AM

## 2016-12-12 NOTE — Progress Notes (Signed)
PULMONARY / CRITICAL CARE MEDICINE   Name: Melissa Oconnell MRN: [id number] DOB: 10-04-1955    ADMISSION DATE:  12/09/2016 CONSULTATION DATE:  12/09/2016   REFERRING MD:  Jeanell Sparrow  CHIEF COMPLAINT:  confusion  HISTORY OF PRESENT ILLNESS:   61 y/o with MS, had perforated sigmoid colon, to OR, VDRF. Septic shock.  SUBJECTIVE:  Passing SBT Low dose levophed  VITAL SIGNS: BP (!) 78/55   Pulse 94   Temp 98.1 F (36.7 C) (Oral)   Resp 18   Ht 5\' 8"  (1.727 m)   Wt 58.8 kg (129 lb 10.1 oz)   SpO2 100%   BMI 19.71 kg/m   HEMODYNAMICS:    VENTILATOR SETTINGS: Vent Mode: PSV;CPAP FiO2 (%):  [30 %] 30 % Set Rate:  [14 bmp] 14 bmp Vt Set:  [500 mL] 500 mL PEEP:  [5 cmH20] 5 cmH20 Pressure Support:  [5 cmH20] 5 cmH20 Plateau Pressure:  [15 cmH20-17 cmH20] 15 cmH20  INTAKE / OUTPUT: I/O last 3 completed shifts: In: 5188.5 [I.V.:4698.5; Other:190; IV ATFTDDUKG:254] Out: 2195 [Urine:1835; Drains:360]  PHYSICAL EXAMINATION: General:  Weak,lying in bed Neuro:  Awake, nods head appropriately to questions, 2/5 strength R arm, flicker toes, minimal movement left arm HEENT:  NCAT ett in place Cardiovascular:  RRR, no mgr Lungs:  CTA B, vent supported breathing Abdomen:  BS+, soft, nontender Musculoskeletal:  Diminished bulk/tone Skin:  Some edema arms bilaterally  LABS:  BMET  Recent Labs Lab 12/10/16 0304 12/10/16 1400 12/11/16 0415 12/12/16 0310  NA 131* 131* 130* 131*  K 3.8 4.7 4.1 3.3*  CL 101  --  105 105  CO2 18*  --  18* 20*  BUN 19  --  17 19  CREATININE 1.34*  --  1.01* 0.92  GLUCOSE 101*  --  50* 103*    Electrolytes  Recent Labs Lab 12/10/16 0304 12/11/16 0415 12/12/16 0310  CALCIUM 8.5* 7.1* 6.9*  MG 2.7* 2.4 2.3  PHOS 3.0 4.0 2.6    CBC  Recent Labs Lab 12/10/16 0304 12/10/16 1400 12/11/16 0415 12/12/16 0310  WBC 2.0*  --  7.8 13.4*  HGB 12.4 11.9* 13.0 10.5*  HCT 38.5 35.0* 38.8 31.1*  PLT 216  --  187 128*    Coag's  Recent  Labs Lab 12/09/16 1415  APTT 26  INR 1.24    Sepsis Markers  Recent Labs Lab 12/09/16 1735 12/09/16 1901 12/09/16 2152 12/10/16 0005  LATICACIDVEN 4.97*  --  5.8* 5.3*  PROCALCITON  --  >150.00  --   --     ABG  Recent Labs Lab 12/10/16 0510 12/10/16 1400 12/11/16 0310  PHART 7.352 7.302* 7.415  PCO2ART 30.9* 37.8 25.8*  PO2ART 88.8 95.0 107    Liver Enzymes  Recent Labs Lab 12/09/16 1415 12/11/16 0415  AST 36 64*  ALT 11* 33  ALKPHOS 60 50  BILITOT 0.7 0.6  ALBUMIN 2.9* 2.0*    Cardiac Enzymes No results for input(s): TROPONINI, PROBNP in the last 168 hours.  Glucose  Recent Labs Lab 12/11/16 1007 12/11/16 1137 12/11/16 1539 12/11/16 1959 12/11/16 2329 12/12/16 0749  GLUCAP 120* 99 96 103* 100* 90    Imaging Dg Chest Port 1 View  Result Date: 12/12/2016 CLINICAL DATA:  Respiratory failure. EXAM: PORTABLE CHEST 1 VIEW COMPARISON:  12/11/2016. FINDINGS: Endotracheal tube, NG tube, left subclavian line in good anatomic position. Heart size stable. Persistent bibasilar atelectasis/infiltrates. Small left pleural effusion. No pneumothorax. Surgical clips right chest. IMPRESSION: 1.  Lines  and tubes in stable position. 2. Persistent bibasilar atelectasis/ infiltrate. Small left pleural effusion. No significant change . Electronically Signed   By: Marcello Moores  Register   On: 12/12/2016 06:34     STUDIES:  5/26 ct head/neck angiogram> no intracranial stenosis 5/27 CT abdomen> free air, sigmoid colon swelling  CULTURES: 5/27 resp > klebsiella, GPC 5/26 blood > neg   ANTIBIOTICS: 5/26 vanc > 5/29 5/26 zosyn >   SIGNIFICANT EVENTS: 5/27 OR exlap, colostomy  LINES/TUBES: 5/26 ETT >  5/26 left subclavian CVL>   DISCUSSION: 61 y/o female with MS baseline who had a perforated sigmoid colon leading to sepsis, encephalopathy, respiratory failure.   ASSESSMENT / PLAN:  PULMONARY A: Acute respiratory failure with hypoxemia P:   Would like to  extubate, but she is very weak, I fear she may not be able to clear secretions Monitor in ICU Talk to family about her baseline strength  CARDIOVASCULAR A:  Septic shock> resolving P:  Tele Wean off levophed  RENAL A:   AKI> resolved Hypokalemia Hypocalcemia P:   Monitor BMET and UOP Replace electrolytes as needed   GASTROINTESTINAL A:   Sigmoid colon perforation S/p ostomy P:   NPO per surgery Nutrition per sugrey  HEMATOLOGIC A:   No acute issues P:  Monitor for bleedign  INFECTIOUS A:   Septic shock due to peritonitis Klebsiella CAP P:   Stop vanc F/u cultures Continue HCAP coverage with zosyn Plan peritonitis coverage for 5 days  ENDOCRINE A:   No acute issues P:   Continue D5 NS  NEUROLOGIC A:   MS baseline > flaring now? Unclear baseline functional status P:   Hold sedation today Talk to family/caregiver about baseline strength   FAMILY  - Updates: none bedside  My cc time 33 minutes  Roselie Awkward, MD [place] P[facility]M Pager: [phone] Cell: [phone] After 3pm or if no response, call [phone]    12/12/2016, 9:53 AM

## 2016-12-12 NOTE — Consult Note (Addendum)
WOC requested to perform first post-op dressing change on Wed at 0830 with PA present to assess wound.   Julien Girt MSN, RN, [place], [place], [place]

## 2016-12-13 LAB — GLUCOSE, CAPILLARY
GLUCOSE-CAPILLARY: 68 mg/dL (ref 65–99)
GLUCOSE-CAPILLARY: 81 mg/dL (ref 65–99)
Glucose-Capillary: 65 mg/dL (ref 65–99)
Glucose-Capillary: 71 mg/dL (ref 65–99)
Glucose-Capillary: 80 mg/dL (ref 65–99)
Glucose-Capillary: 85 mg/dL (ref 65–99)
Glucose-Capillary: 85 mg/dL (ref 65–99)

## 2016-12-13 LAB — CBC
HCT: 26.5 % — ABNORMAL LOW (ref 36.0–46.0)
Hemoglobin: 9 g/dL — ABNORMAL LOW (ref 12.0–15.0)
MCH: 30.4 pg (ref 26.0–34.0)
MCHC: 34 g/dL (ref 30.0–36.0)
MCV: 89.5 fL (ref 78.0–100.0)
PLATELETS: 102 10*3/uL — AB (ref 150–400)
RBC: 2.96 MIL/uL — ABNORMAL LOW (ref 3.87–5.11)
RDW: 13.8 % (ref 11.5–15.5)
WBC: 13 10*3/uL — AB (ref 4.0–10.5)

## 2016-12-13 LAB — MAGNESIUM: Magnesium: 2 mg/dL (ref 1.7–2.4)

## 2016-12-13 LAB — CBC WITH DIFFERENTIAL/PLATELET
BASOS ABS: 0 10*3/uL (ref 0.0–0.1)
Basophils Relative: 0 %
EOS ABS: 0 10*3/uL (ref 0.0–0.7)
Eosinophils Relative: 0 %
HCT: 25.2 % — ABNORMAL LOW (ref 36.0–46.0)
HEMOGLOBIN: 8.6 g/dL — AB (ref 12.0–15.0)
LYMPHS PCT: 9 %
Lymphs Abs: 1.1 10*3/uL (ref 0.7–4.0)
MCH: 30.6 pg (ref 26.0–34.0)
MCHC: 34.1 g/dL (ref 30.0–36.0)
MCV: 89.7 fL (ref 78.0–100.0)
MONO ABS: 0.2 10*3/uL (ref 0.1–1.0)
Monocytes Relative: 2 %
NEUTROS ABS: 11 10*3/uL — AB (ref 1.7–7.7)
Neutrophils Relative %: 89 %
PLATELETS: 96 10*3/uL — AB (ref 150–400)
RBC: 2.81 MIL/uL — ABNORMAL LOW (ref 3.87–5.11)
RDW: 14 % (ref 11.5–15.5)
WBC Morphology: INCREASED
WBC: 12.3 10*3/uL — ABNORMAL HIGH (ref 4.0–10.5)

## 2016-12-13 LAB — BASIC METABOLIC PANEL
ANION GAP: 5 (ref 5–15)
Anion gap: 3 — ABNORMAL LOW (ref 5–15)
Anion gap: 3 — ABNORMAL LOW (ref 5–15)
BUN: 16 mg/dL (ref 6–20)
BUN: 14 mg/dL (ref 6–20)
BUN: 13 mg/dL (ref 6–20)
CALCIUM: 7.3 mg/dL — AB (ref 8.9–10.3)
CALCIUM: 7.5 mg/dL — AB (ref 8.9–10.3)
CHLORIDE: 113 mmol/L — AB (ref 101–111)
CHLORIDE: 114 mmol/L — AB (ref 101–111)
CO2: 20 mmol/L — ABNORMAL LOW (ref 22–32)
CO2: 22 mmol/L (ref 22–32)
CO2: 22 mmol/L (ref 22–32)
Calcium: 7.4 mg/dL — ABNORMAL LOW (ref 8.9–10.3)
Chloride: 110 mmol/L (ref 101–111)
Creatinine, Ser: 0.7 mg/dL (ref 0.44–1.00)
Creatinine, Ser: 0.68 mg/dL (ref 0.44–1.00)
Creatinine, Ser: 0.64 mg/dL (ref 0.44–1.00)
GFR calc Af Amer: >60 mL/min (ref >60)
GFR calc Af Amer: >60 mL/min (ref >60)
GFR calc non Af Amer: >60 mL/min (ref >60)
GFR calc non Af Amer: >60 mL/min (ref >60)
GLUCOSE: 89 mg/dL (ref 65–99)
GLUCOSE: 89 mg/dL (ref 65–99)
Glucose, Bld: 84 mg/dL (ref 65–99)
Potassium: 2.8 mmol/L — ABNORMAL LOW (ref 3.5–5.1)
Potassium: 4 mmol/L (ref 3.5–5.1)
Potassium: 3.9 mmol/L (ref 3.5–5.1)
SODIUM: 138 mmol/L (ref 135–145)
Sodium: 135 mmol/L (ref 135–145)
Sodium: 139 mmol/L (ref 135–145)

## 2016-12-13 MED ORDER — FENTANYL CITRATE (PF) 100 MCG/2ML IJ SOLN: 12.5000 ug | INTRAMUSCULAR | Status: DC | PRN

## 2016-12-13 MED ORDER — VITAL HIGH PROTEIN PO LIQD: 1000.0000 mL | ORAL | Status: DC

## 2016-12-13 MED ORDER — DEXTROSE 50 % IV SOLN
INTRAVENOUS | Status: AC
  Administered 2016-12-13: 25 mL
  Filled 2016-12-13: qty 50

## 2016-12-13 MED ORDER — POTASSIUM CHLORIDE 10 MEQ/50ML IV SOLN
10.0000 meq | INTRAVENOUS | Status: AC
  Administered 2016-12-13 (×8): 10 meq via INTRAVENOUS
  Filled 2016-12-13 (×8): qty 50

## 2016-12-13 MED ORDER — POTASSIUM CHLORIDE 20 MEQ/15ML (10%) PO SOLN
40.0000 meq | Freq: Once | ORAL | Status: AC
  Administered 2016-12-13: 40 meq
  Filled 2016-12-13: qty 30

## 2016-12-13 MED ORDER — ORAL CARE MOUTH RINSE
15.0000 mL | Freq: Two times a day (BID) | OROMUCOSAL | Status: DC
  Administered 2016-12-13 – 2016-12-14 (×2): 15 mL via OROMUCOSAL

## 2016-12-13 MED ORDER — DEXTROSE 50 % IV SOLN
INTRAVENOUS | Status: AC
  Administered 2016-12-13: 50 mL
  Filled 2016-12-13: qty 50

## 2016-12-13 NOTE — Progress Notes (Signed)
Pt's CBG 68 and Dr. Lake Bells notified. New orders carried out.

## 2016-12-13 NOTE — Progress Notes (Signed)
3 Days Post-Op    CC:  Sepsis/AMS/free intraabdominal air  Subjective: Still on the VENT.  Dr. Lake Bells has had Neuro evaluate her.   Dr. Lake Bells would like to give steroids, in big doses .   She is awake and moves some with exam.  Wound vac and Ostomy site both look good.  Objective: Vital signs in last 24 hours: Temp:  [97.6 F (36.4 C)-99 F (37.2 C)] 99 F (37.2 C) (05/30 0752) Pulse Rate:  [81-98] 88 (05/30 0800) Resp:  [13-23] 18 (05/30 0800) BP: (76-127)/(30-88) 98/64 (05/30 0800) SpO2:  [99 %-100 %] 100 % (05/30 0800) FiO2 (%):  [30 %] 30 % (05/30 0800) Weight:  [60.6 kg (133 lb 9.6 oz)] 60.6 kg (133 lb 9.6 oz) (05/30 0357) Last BM Date: 12/13/16 3058 IV. 925 urine 110 from drain Stool recorded Afebrile, VSS K+ 2.8 H/H is down, not sure why from surgical standpoint Film yesterday:  Endotracheal tube, NG tube, left subclavian line in good anatomic position. Heart size stable. Persistent bibasilar atelectasis/infiltrates. Small left pleural effusion. No pneumothorax. Surgical clips right chest.  Intake/Output from previous day: 05/29 0701 - 05/30 0700 In: 3058.3 [I.V.:2448.3; IV Piggyback:610] Out: 2993 [Urine:925; Drains:110] Intake/Output this shift: Total I/O In: 201.9 [I.V.:101.9; IV Piggyback:100] Out: 75 [Urine:75]  General appearance: alert, no distress and still Vent dependent GI: soft, open wound and ostomy look fine.  No BS, some stool in the ostomy, but I can't tell if that is what was there yesterday or not.    Lab Results:   Recent Labs  12/12/16 0310 12/13/16 0350  WBC 13.4* 12.3*  HGB 10.5* 8.6*  HCT 31.1* 25.2*  PLT 128* 96*    BMET  Recent Labs  12/12/16 0310 12/13/16 0350  NA 131* 135  K 3.3* 2.8*  CL 105 110  CO2 20* 20*  GLUCOSE 103* 89  BUN 19 16  CREATININE 0.92 0.70  CALCIUM 6.9* 7.3*   PT/INR No results for input(s): LABPROT, INR in the last 72 hours.   Recent Labs Lab 12/09/16 1415 12/11/16 0415  AST 36  64*  ALT 11* 33  ALKPHOS 60 50  BILITOT 0.7 0.6  PROT 6.0* 4.5*  ALBUMIN 2.9* 2.0*     Lipase     Component Value Date/Time   LIPASE 30 05/29/2007 1535     Medications: . chlorhexidine gluconate (MEDLINE KIT)  15 mL Mouth Rinse BID  . Chlorhexidine Gluconate Cloth  6 each Topical Once  . heparin  5,000 Units Subcutaneous Q8H  . insulin aspart  2-6 Units Subcutaneous Q4H  . mouth rinse  15 mL Mouth Rinse QID  . pantoprazole (PROTONIX) IV  40 mg Intravenous Q12H    Assessment/Plan Perforated sigmoid colon from sigmoid diverticulitis with fecal peritonitis and Hinchey 4 Exploratory laparotomy with sigmoid colectomy and formation of colostomy with lysis of adhesions, 12/10/16, Dr. Marcello Moores [place]  POD  2 Shock secondary to sepsis Acute renal failure Leukopenia - severe sepsis Acute metabolic encephalopathy  Some history of dementia Multiple sclerosis FEN:  IV fluids/NPO ID:  Zosyn/Vancomycin 5/26 =>> day 5 DVT: Heparin  Plan:  I will discuss steroids with Dr. Redmond Pulling, notes report gross contmination all quadrants.  She has clear serous fluid from her drain.         LOS: 4 days    Melissa Oconnell 12/13/2016 [phone]

## 2016-12-13 NOTE — Progress Notes (Signed)
eLink Physician-Brief Progress Note Patient Name: ALMETER WESTHOFF DOB: [birth year] MRN: [id number]   Date of Service  12/13/2016  HPI/Events of Note  Camera check on patient postextubation at approximately 10:02 AM. Resting comfortably on nasal cannula with eyes closed. Vital signs stable. Normal work of breathing.   eICU Interventions  Continuing close monitoring postextubation.      Intervention Category Major Interventions: Respiratory failure - evaluation and management  Tera Partridge 12/13/2016, 9:49 PM

## 2016-12-13 NOTE — Progress Notes (Signed)
[facility] ADULT ICU REPLACEMENT PROTOCOL FOR AM LAB REPLACEMENT ONLY  The patient does apply for the [facility] Adult ICU Electrolyte Replacment Protocol based on the criteria listed below:   1. Is GFR >/= 40 ml/min? Yes.    Patient's GFR today >60 2. Is urine output >/= 0.5 ml/kg/hr for the last 6 hours? Yes.   Patient's UOP is0.6 ml/kg/hr 3. Is BUN < 60 mg/dL? Yes.    Patient's BUN today is 16 4. Abnormal electrolyte(s): 2.8  5. Ordered repletion with: per protocol 6. If a panic level lab has been reported, has the CCM MD in charge been notified? Yes.  .   Physician:  Dr. Kirke Corin, Philis Nettle 12/13/2016 4:44 AM

## 2016-12-13 NOTE — Consult Note (Addendum)
[place] Nurse wound consult note Reason for Consult: Vac dressing changed with PA at the bedside to assess wound appearance during first post-op dressing. Wound type: Full thickness post-op wound to midline abd Measurement: 21X3X.2cm Wound bed: beefy red Drainage (amount, consistency, odor) scant amt pink drainage in the cannister, no odor Periwound: Intact skin surrounding Dressing procedure/placement/frequency: Pt medicated for pain prior to the procedure and tolerated with minimal discomfort.  Applied one piece black foam to 129mm cont suction.  Pt will not need the Vac machine by the time she is discharged, since wound is very shallow.   Abd wound is located very close to the stoma; WOC will perform dressing change Q M/W/F with pouch changes.  [place] Nurse ostomy consult note Stoma type/location: Colostomy surgery was performed 5/27. Stomal assessment/size: Stoma red and viable, slightly above skin level, 1 3/4 inches.   Peristomal assessment:  Intact skin surrounding Output: Mod amt brown liquid stool in the pouch Ostomy pouching:  2pc.  Education provided: Applied 2 piece pouch and barrier ring to maintain seal.  No family members present for teaching session and pt is sedated.  Will begin teaching sessions when stable and out of ICU. Enrolled patient in [place] program: No Julien Girt MSN, [place], [place], [place], [place]

## 2016-12-13 NOTE — Progress Notes (Signed)
PULMONARY / CRITICAL CARE MEDICINE   Name: Melissa Oconnell MRN: [id number] DOB: [birth year]    ADMISSION DATE:  12/09/2016 CONSULTATION DATE:  12/09/2016   REFERRING MD:  Jeanell Sparrow  CHIEF COMPLAINT:  confusion  HISTORY OF PRESENT ILLNESS:   61 y/o with MS, had perforated sigmoid colon, to OR, VDRF. Septic shock.  SUBJECTIVE:  Seen by neurology Passing SBT again hypokalemia  VITAL SIGNS: BP 98/64 (BP Location: Left Arm)   Pulse 88   Temp 99 F (37.2 C) (Oral)   Resp 18   Ht 5\' 8"  (1.727 m)   Wt 60.6 kg (133 lb 9.6 oz)   SpO2 100%   BMI 20.31 kg/m   HEMODYNAMICS:    VENTILATOR SETTINGS: Vent Mode: PRVC FiO2 (%):  [30 %] 30 % Set Rate:  [14 bmp] 14 bmp Vt Set:  [500 mL] 500 mL PEEP:  [5 cmH20] 5 cmH20 Plateau Pressure:  [10 cmH20-18 cmH20] 18 cmH20  INTAKE / OUTPUT: I/O last 3 completed shifts: In: 4673.5 [I.V.:3753.5; Other:110; IV Piggyback:810] Out: 1435 [IWLNL:8921; Drains:150]  PHYSICAL EXAMINATION: General:  Resting comfortably in bed HENT: NCAT ETT in place PULM: CTA B, vent supported breathing CV: RRR, no mgr GI: BS+, soft, nontender MSK: normal bulk and tone Neuro: arouses easily, follow commands, able to raise hand in air today, lift head off pillow   LABS:  BMET  Recent Labs Lab 12/11/16 0415 12/12/16 0310 12/13/16 0350  NA 130* 131* 135  K 4.1 3.3* 2.8*  CL 105 105 110  CO2 18* 20* 20*  BUN 17 19 16   CREATININE 1.01* 0.92 0.70  GLUCOSE 50* 103* 89    Electrolytes  Recent Labs Lab 12/10/16 0304 12/11/16 0415 12/12/16 0310 12/13/16 0350  CALCIUM 8.5* 7.1* 6.9* 7.3*  MG 2.7* 2.4 2.3  --   PHOS 3.0 4.0 2.6  --     CBC  Recent Labs Lab 12/11/16 0415 12/12/16 0310 12/13/16 0350  WBC 7.8 13.4* 12.3*  HGB 13.0 10.5* 8.6*  HCT 38.8 31.1* 25.2*  PLT 187 128* 96*    Coag's  Recent Labs Lab 12/09/16 1415  APTT 26  INR 1.24    Sepsis Markers  Recent Labs Lab 12/09/16 1735 12/09/16 1901 12/09/16 2152 12/10/16 0005   LATICACIDVEN 4.97*  --  5.8* 5.3*  PROCALCITON  --  >150.00  --   --     ABG  Recent Labs Lab 12/10/16 0510 12/10/16 1400 12/11/16 0310  PHART 7.352 7.302* 7.415  PCO2ART 30.9* 37.8 25.8*  PO2ART 88.8 95.0 107    Liver Enzymes  Recent Labs Lab 12/09/16 1415 12/11/16 0415  AST 36 64*  ALT 11* 33  ALKPHOS 60 50  BILITOT 0.7 0.6  ALBUMIN 2.9* 2.0*    Cardiac Enzymes No results for input(s): TROPONINI, PROBNP in the last 168 hours.  Glucose  Recent Labs Lab 12/12/16 1339 12/12/16 1607 12/12/16 1942 12/13/16 0006 12/13/16 0345 12/13/16 0748  GLUCAP 87 93 84 81 85 65    Imaging No results found.   STUDIES:  5/26 ct head/neck angiogram> no intracranial stenosis 5/27 CT abdomen> free air, sigmoid colon swelling  CULTURES: 5/27 resp > klebsiella, GPC 5/26 blood > neg   ANTIBIOTICS: 5/26 vanc > 5/29 5/26 zosyn > 5/29 5/29 unasyn >   SIGNIFICANT EVENTS: 5/27 OR exlap, colostomy  LINES/TUBES: 5/26 ETT >  5/26 left subclavian CVL>   DISCUSSION: 61 y/o female with MS baseline who had a perforated sigmoid colon leading to sepsis, encephalopathy, respiratory  failure.   ASSESSMENT / PLAN:  PULMONARY A: Acute respiratory failure with hypoxemia > limited by weakness P:   Pressure support as tolerated VAP prevention  CARDIOVASCULAR A:  Septic shock> resolving P:  Tele Wean off levophed  RENAL A:   Hypokalemia >  Hypocalcemia P:   Replace K IV and po Monitor BMET and UOP Replace electrolytes as needed   GASTROINTESTINAL A:   Sigmoid colon perforation S/p ostomy P:   Start trickle feeds today   HEMATOLOGIC A:   Anemia > no clear sign of bleeding, suspect dilution P:  Monitor CBC q shift Transfuse if Hgb < 7gm/dL  INFECTIOUS A:   Septic shock due to peritonitis > improving Klebsiella CAP P:   Plan to run unasyn for 7 day course of antibiotics total  ENDOCRINE A:   No acute issues P:   KVO D5  NEUROLOGIC A:    MS baseline > stronger today  P:   Replace K today PT post extubation Mobilize asap   FAMILY  - Updates: none bedside  My cc time 32 minutes  Roselie Awkward, MD [place] PCCM Pager: [phone] Cell: [phone] After 3pm or if no response, call [phone]    12/13/2016, 8:55 AM

## 2016-12-13 NOTE — Care Management Note (Addendum)
Case Management Note  Patient Details  Name: Melissa Oconnell MRN: [id number] Date of Birth: [birth year]  Subjective/Objective:   Pt admitted with Left side weakness - found to have bowel perf -  Now with wound vac and ostomy                   Action/Plan:  PTA lived with caregiver [name] [facility]) Per POA pt is semi independent with ADLs.  Pt extubated today.  PT eval ordered for pt due to additional medical complexity.    Expected Discharge Date:  12/19/16               Expected Discharge Plan:     In-House Referral:     Discharge planning Services  CM Consult  Post Acute Care Choice:    Choice offered to:     DME Arranged:    DME Agency:     HH Arranged:    HH Agency:     Status of Service:     If discussed at [employer], dates discussed:    Additional Comments:  Maryclare Labrador, RN 12/13/2016, 11:54 AM

## 2016-12-13 NOTE — Procedures (Signed)
Extubation Procedure Note  Patient Details:   Name: Melissa Oconnell DOB: October 24, 1955 MRN: [id number]   Airway Documentation:     Evaluation  O2 sats: stable throughout Complications: No apparent complications Patient did tolerate procedure well. Bilateral Breath Sounds: Clear, Diminished   Yes  4l/min [place] IS placed in room, unable to perform at this time.  Revonda Standard 12/13/2016, 10:02 AM

## 2016-12-14 ENCOUNTER — Inpatient Hospital Stay (HOSPITAL_COMMUNITY): Payer: Medicare Other

## 2016-12-14 ENCOUNTER — Inpatient Hospital Stay (HOSPITAL_COMMUNITY): Payer: Medicare Other | Admitting: Certified Registered"

## 2016-12-14 DIAGNOSIS — I469 Cardiac arrest, cause unspecified: Secondary | ICD-10-CM

## 2016-12-14 DIAGNOSIS — R413 Other amnesia: Secondary | ICD-10-CM

## 2016-12-14 LAB — BASIC METABOLIC PANEL
ANION GAP: 4 — AB (ref 5–15)
Anion gap: 13 (ref 5–15)
BUN: 16 mg/dL (ref 6–20)
BUN: 20 mg/dL (ref 6–20)
CALCIUM: 7.5 mg/dL — AB (ref 8.9–10.3)
CHLORIDE: 114 mmol/L — AB (ref 101–111)
CHLORIDE: 112 mmol/L — AB (ref 101–111)
CO2: 22 mmol/L (ref 22–32)
CO2: 15 mmol/L — ABNORMAL LOW (ref 22–32)
CREATININE: 0.66 mg/dL (ref 0.44–1.00)
CREATININE: 1.07 mg/dL — AB (ref 0.44–1.00)
Calcium: 7.6 mg/dL — ABNORMAL LOW (ref 8.9–10.3)
GFR calc Af Amer: >60 mL/min (ref >60)
GFR calc non Af Amer: >60 mL/min (ref >60)
GFR calc non Af Amer: 55 mL/min — ABNORMAL LOW (ref >60)
Glucose, Bld: 87 mg/dL (ref 65–99)
Glucose, Bld: 297 mg/dL — ABNORMAL HIGH (ref 65–99)
POTASSIUM: 3.9 mmol/L (ref 3.5–5.1)
Potassium: 3.9 mmol/L (ref 3.5–5.1)
SODIUM: 140 mmol/L (ref 135–145)
SODIUM: 140 mmol/L (ref 135–145)

## 2016-12-14 LAB — CBC
HCT: 29 % — ABNORMAL LOW (ref 36.0–46.0)
HEMATOCRIT: 25.8 % — AB (ref 36.0–46.0)
Hemoglobin: 8.7 g/dL — ABNORMAL LOW (ref 12.0–15.0)
Hemoglobin: 9.3 g/dL — ABNORMAL LOW (ref 12.0–15.0)
MCH: 30.4 pg (ref 26.0–34.0)
MCH: 30 pg (ref 26.0–34.0)
MCHC: 33.7 g/dL (ref 30.0–36.0)
MCHC: 32.1 g/dL (ref 30.0–36.0)
MCV: 90.2 fL (ref 78.0–100.0)
MCV: 93.5 fL (ref 78.0–100.0)
PLATELETS: 114 10*3/uL — AB (ref 150–400)
PLATELETS: 125 10*3/uL — AB (ref 150–400)
RBC: 2.86 MIL/uL — AB (ref 3.87–5.11)
RBC: 3.1 MIL/uL — ABNORMAL LOW (ref 3.87–5.11)
RDW: 14.1 % (ref 11.5–15.5)
RDW: 14.7 % (ref 11.5–15.5)
WBC: 13.3 10*3/uL — AB (ref 4.0–10.5)
WBC: 17.4 10*3/uL — AB (ref 4.0–10.5)

## 2016-12-14 LAB — CULTURE, BLOOD (ROUTINE X 2)
CULTURE: NO GROWTH
CULTURE: NO GROWTH
CULTURE: NO GROWTH
SPECIAL REQUESTS: ADEQUATE
Special Requests: ADEQUATE
Special Requests: ADEQUATE

## 2016-12-14 LAB — GLUCOSE, CAPILLARY
GLUCOSE-CAPILLARY: 79 mg/dL (ref 65–99)
GLUCOSE-CAPILLARY: 85 mg/dL (ref 65–99)
GLUCOSE-CAPILLARY: 108 mg/dL — AB (ref 65–99)
Glucose-Capillary: 166 mg/dL — ABNORMAL HIGH (ref 65–99)
Glucose-Capillary: 175 mg/dL — ABNORMAL HIGH (ref 65–99)
Glucose-Capillary: 272 mg/dL — ABNORMAL HIGH (ref 65–99)

## 2016-12-14 LAB — POCT I-STAT 3, ART BLOOD GAS (G3+)
Acid-base deficit: 10 mmol/L — ABNORMAL HIGH (ref 0.0–2.0)
Bicarbonate: 15.5 mmol/L — ABNORMAL LOW (ref 20.0–28.0)
O2 Saturation: 97 %
PCO2 ART: 29.7 mmHg — AB (ref 32.0–48.0)
PH ART: 7.322 — AB (ref 7.350–7.450)
PO2 ART: 91 mmHg (ref 83.0–108.0)
Patient temperature: 97
TCO2: 16 mmol/L (ref 0–100)

## 2016-12-14 LAB — LACTIC ACID, PLASMA: LACTIC ACID, VENOUS: 8.5 mmol/L — AB (ref 0.5–1.9)

## 2016-12-14 LAB — PHOSPHORUS: Phosphorus: 5.9 mg/dL — ABNORMAL HIGH (ref 2.5–4.6)

## 2016-12-14 LAB — MAGNESIUM
Magnesium: 2 mg/dL (ref 1.7–2.4)
Magnesium: 2.3 mg/dL (ref 1.7–2.4)

## 2016-12-14 MED ORDER — SODIUM CHLORIDE 0.9 % IV SOLN
1000.0000 mg | Freq: Every day | INTRAVENOUS | Status: DC
  Administered 2016-12-14 – 2016-12-17 (×4): 1000 mg via INTRAVENOUS
  Filled 2016-12-14 (×5): qty 8

## 2016-12-14 MED ORDER — CHLORHEXIDINE GLUCONATE 0.12% ORAL RINSE (MEDLINE KIT)
15.0000 mL | Freq: Two times a day (BID) | OROMUCOSAL | Status: DC
  Administered 2016-12-14 – 2016-12-18 (×8): 15 mL via OROMUCOSAL

## 2016-12-14 MED ORDER — FENTANYL CITRATE (PF) 100 MCG/2ML IJ SOLN: 100.0000 ug | INTRAMUSCULAR | Status: DC | PRN

## 2016-12-14 MED ORDER — MIDAZOLAM HCL 2 MG/2ML IJ SOLN: 2.0000 mg | INTRAMUSCULAR | Status: DC | PRN

## 2016-12-14 MED ORDER — FENTANYL CITRATE (PF) 100 MCG/2ML IJ SOLN
100.0000 ug | INTRAMUSCULAR | Status: DC | PRN
  Administered 2016-12-14: 100 ug via INTRAVENOUS
  Administered 2016-12-15: 50 ug via INTRAVENOUS
  Filled 2016-12-14 (×2): qty 2

## 2016-12-14 MED ORDER — SODIUM CHLORIDE 0.9% FLUSH
10.0000 mL | Freq: Two times a day (BID) | INTRAVENOUS | Status: DC
  Administered 2016-12-14 – 2016-12-20 (×12): 10 mL
  Administered 2016-12-21: 20 mL
  Administered 2016-12-21 – 2016-12-26 (×9): 10 mL

## 2016-12-14 MED ORDER — CHLORHEXIDINE GLUCONATE CLOTH 2 % EX PADS
6.0000 | MEDICATED_PAD | Freq: Every day | CUTANEOUS | Status: DC
  Administered 2016-12-14: 6 via TOPICAL

## 2016-12-14 MED ORDER — CHLORHEXIDINE GLUCONATE CLOTH 2 % EX PADS
6.0000 | MEDICATED_PAD | Freq: Every day | CUTANEOUS | Status: DC
  Administered 2016-12-14 – 2016-12-26 (×12): 6 via TOPICAL

## 2016-12-14 MED ORDER — ORAL CARE MOUTH RINSE
15.0000 mL | Freq: Four times a day (QID) | OROMUCOSAL | Status: DC
  Administered 2016-12-15 – 2016-12-18 (×13): 15 mL via OROMUCOSAL

## 2016-12-14 MED ORDER — SODIUM CHLORIDE 0.9% FLUSH
10.0000 mL | Freq: Two times a day (BID) | INTRAVENOUS | Status: DC
  Administered 2016-12-14: 30 mL

## 2016-12-14 MED ORDER — SODIUM CHLORIDE 0.9% FLUSH: 10.0000 mL | INTRAVENOUS | Status: DC | PRN

## 2016-12-14 NOTE — Progress Notes (Signed)
Rehab Admissions Coordinator Note:  Patient was screened by Cleatrice Burke for appropriateness for an Inpatient Acute Rehab Consult per PT recommendation.  At this time, we are recommending Inpatient Rehab consult. Please place order for an inpt rehab consult to assess if pt would be a candidate to admit to CIR.  Cleatrice Burke 12/14/2016, 9:18 AM  I can be reached at [phone].

## 2016-12-14 NOTE — Progress Notes (Signed)
Pt being fed dinner by caregiver when sats dropped to 70%. Checked on pt who became unresponsive, pt became bradycardic, and then pulseless. CPR started, code called (see code sheet) and pt intubated. Red saliva came out of ETT that looked like the red jello from her dinner tray. Caregiver/POA [name] updated on pt status.

## 2016-12-14 NOTE — Progress Notes (Signed)
PULMONARY / CRITICAL CARE MEDICINE   Name: Melissa Oconnell MRN: [id number] DOB: 02-Apr-1956    ADMISSION DATE:  12/09/2016 CONSULTATION DATE:  12/09/2016   REFERRING MD:  Jeanell Sparrow  CHIEF COMPLAINT:  confusion  HISTORY OF PRESENT ILLNESS:   61 y/o with MS, had perforated sigmoid colon, to OR, VDRF. Septic shock.  SUBJECTIVE:  Extubated yesterday Doing OK overnight  VITAL SIGNS: BP (!) 85/65   Pulse 85   Temp 97.3 F (36.3 C) (Oral)   Resp (!) 39   Ht 5\' 8"  (1.727 m)   Wt 58.8 kg (129 lb 10.1 oz)   SpO2 99%   BMI 19.71 kg/m   HEMODYNAMICS:    VENTILATOR SETTINGS: Vent Mode: PSV;CPAP FiO2 (%):  [30 %] 30 % PEEP:  [5 cmH20] 5 cmH20 Pressure Support:  [5 cmH20] 5 cmH20  INTAKE / OUTPUT: I/O last 3 completed shifts: In: 3300 [I.V.:2400; IV Piggyback:900] Out: 1130 [Urine:885; Drains:95; Stool:150]  PHYSICAL EXAMINATION:  Gen: sitting up with help on side of bed HENT: NCAT OP clear PULM: CTA B, normal effort CV: RRR, no mgr GI: BS+, soft, nontender, drain/dressing in place MSK: diminshed tone Neuro: can't hold up head, can only sit up with assistance   LABS:  BMET  Recent Labs Lab 12/13/16 1550 12/13/16 2108 12/14/16 0405  NA 138 139 140  K 4.0 3.9 3.9  CL 113* 114* 114*  CO2 22 22 22   BUN 14 13 16   CREATININE 0.68 0.64 0.66  GLUCOSE 84 89 87    Electrolytes  Recent Labs Lab 12/10/16 0304 12/11/16 0415 12/12/16 0310  12/13/16 1550 12/13/16 2108 12/14/16 0405  CALCIUM 8.5* 7.1* 6.9*  < > 7.5* 7.4* 7.6*  MG 2.7* 2.4 2.3  --  2.0  --  2.0  PHOS 3.0 4.0 2.6  --   --   --   --   < > = values in this interval not displayed.  CBC  Recent Labs Lab 12/13/16 0350 12/13/16 1550 12/14/16 0405  WBC 12.3* 13.0* 13.3*  HGB 8.6* 9.0* 8.7*  HCT 25.2* 26.5* 25.8*  PLT 96* 102* 114*    Coag's  Recent Labs Lab 12/09/16 1415  APTT 26  INR 1.24    Sepsis Markers  Recent Labs Lab 12/09/16 1735 12/09/16 1901 12/09/16 2152 12/10/16 0005   LATICACIDVEN 4.97*  --  5.8* 5.3*  PROCALCITON  --  >150.00  --   --     ABG  Recent Labs Lab 12/10/16 0510 12/10/16 1400 12/11/16 0310  PHART 7.352 7.302* 7.415  PCO2ART 30.9* 37.8 25.8*  PO2ART 88.8 95.0 107    Liver Enzymes  Recent Labs Lab 12/09/16 1415 12/11/16 0415  AST 36 64*  ALT 11* 33  ALKPHOS 60 50  BILITOT 0.7 0.6  ALBUMIN 2.9* 2.0*    Cardiac Enzymes No results for input(s): TROPONINI, PROBNP in the last 168 hours.  Glucose  Recent Labs Lab 12/13/16 1135 12/13/16 1555 12/13/16 1956 12/13/16 2331 12/14/16 0343 12/14/16 0801  GLUCAP 68 71 85 80 79 85    Imaging No results found.   STUDIES:  5/26 ct head/neck angiogram> no intracranial stenosis 5/27 CT abdomen> free air, sigmoid colon swelling  CULTURES: 5/27 resp > klebsiella, GPC 5/26 blood > neg   ANTIBIOTICS: 5/26 vanc > 5/29 5/26 zosyn > 5/29 5/29 unasyn >   SIGNIFICANT EVENTS: 5/27 OR exlap, colostomy  LINES/TUBES: 5/26 ETT >  5/26 left subclavian CVL>   DISCUSSION: 60 y/o female with MS baseline  who had a perforated sigmoid colon leading to sepsis, encephalopathy, respiratory failure.   ASSESSMENT / PLAN:  PULMONARY A: Acute respiratory failure with hypoxemia > resolved P:   Monitor O2 saturation Incentive spirometry SLP evaluation  CARDIOVASCULAR A:  Septic shock> resolved P:  Tele Monitor hemodynamics  RENAL A:   Hypokalemia > resolved P:   Monitor BMET and UOP Replace electrolytes as needed   GASTROINTESTINAL A:   Sigmoid colon perforation S/p ostomy P:   Advance diet SLP evaluation   HEMATOLOGIC A:   Anemia > no clear sign of bleeding, suspect dilution P:  Monitor CBC q shift Transfuse if Hgb < 7gm/dL  INFECTIOUS A:   Septic shock due to peritonitis > improving Klebsiella CAP P:   Plan to run unasyn for 7 day course of antibiotics total  ENDOCRINE A:   No acute issues P:   KVO D5  NEUROLOGIC A:   MS baseline > she  feels that she is weaker and thinks she is having a flare up P:   PT today Mobilize    FAMILY  - Updates: updated [name] (Welcome) 5/29  Move to SDU, [facility] service  Roselie Awkward, MD [place] PCCM Pager: [phone] Cell: [phone] After 3pm or if no response, call [phone]    12/14/2016, 8:39 AM

## 2016-12-14 NOTE — Progress Notes (Signed)
Inpatient Rehabilitation  Met with patient to discuss team's recommendation for IP Rehab.  Shared booklets and answered questions.  Plan to reach out to patient's health care POA tomorrow.  Will follow along for timing of medical readiness and bed availability.  Please call with questions.   Carmelia Roller., CCC/SLP Admission Coordinator  [place]  Cell [phone]

## 2016-12-14 NOTE — Progress Notes (Signed)
Subjective: After correction of electrolyte disturbance and successful extubation, the patient is still diffusely weak.   Objective: Current vital signs: BP (!) 89/72   Pulse 81   Temp 97.3 F (36.3 C) (Oral)   Resp (!) 34   Ht 5' 8" (1.727 m)   Wt 58.8 kg (129 lb 10.1 oz)   SpO2 100%   BMI 19.71 kg/m  Vital signs in last 24 hours: Temp:  [97.3 F (36.3 C)-100.1 F (37.8 C)] 97.3 F (36.3 C) (05/31 0800) Pulse Rate:  [81-104] 81 (05/31 1200) Resp:  [19-41] 34 (05/31 1200) BP: (81-107)/(56-75) 89/72 (05/31 1200) SpO2:  [89 %-100 %] 100 % (05/31 1200) Weight:  [58.8 kg (129 lb 10.1 oz)] 58.8 kg (129 lb 10.1 oz) (05/31 0347)  Intake/Output from previous day: 05/30 0701 - 05/31 0700 In: 1827.2 [I.V.:1227.2; IV Piggyback:600] Out: 610 [Urine:425; Drains:35; Stool:150] Intake/Output this shift: Total I/O In: 438 [I.V.:280; IV Piggyback:158] Out: 90 [Urine:90] Nutritional status: Diet clear liquid Room service appropriate? Yes; Fluid consistency: Thin  Neurologic Exam: Ment: Awake and alert. Answers questions correctly with 1-3 word replies. Able to follow all commands. CN: EOMI with saccadic quality of visual pursuits. Facial motor function grossly symmetric. Speech is dysarthric.  Motor: 4-/5 bilateral upper extremities.  3/5 bilateral lower extremities.  Decreased muscle bulk x 4.  Spastic tone bilaterally.     Lab Results: Results for orders placed or performed during the hospital encounter of 12/09/16 (from the past 48 hour(s))  Glucose, capillary     Status: None   Collection Time: 12/12/16  1:39 PM  Result Value Ref Range   Glucose-Capillary 87 65 - 99 mg/dL  Triglycerides     Status: None   Collection Time: 12/12/16  2:44 PM  Result Value Ref Range   Triglycerides 106 <150 mg/dL  Glucose, capillary     Status: None   Collection Time: 12/12/16  4:07 PM  Result Value Ref Range   Glucose-Capillary 93 65 - 99 mg/dL   Comment 1 Capillary Specimen   Glucose,  capillary     Status: None   Collection Time: 12/12/16  7:42 PM  Result Value Ref Range   Glucose-Capillary 84 65 - 99 mg/dL   Comment 1 Notify RN   Glucose, capillary     Status: None   Collection Time: 12/13/16 12:06 AM  Result Value Ref Range   Glucose-Capillary 81 65 - 99 mg/dL   Comment 1 Notify RN   Glucose, capillary     Status: None   Collection Time: 12/13/16  3:45 AM  Result Value Ref Range   Glucose-Capillary 85 65 - 99 mg/dL   Comment 1 Notify RN   BMET in AM     Status: Abnormal   Collection Time: 12/13/16  3:50 AM  Result Value Ref Range   Sodium 135 135 - 145 mmol/L   Potassium 2.8 (L) 3.5 - 5.1 mmol/L   Chloride 110 101 - 111 mmol/L   CO2 20 (L) 22 - 32 mmol/L   Glucose, Bld 89 65 - 99 mg/dL   BUN 16 6 - 20 mg/dL   Creatinine, Ser 0.70 0.44 - 1.00 mg/dL   Calcium 7.3 (L) 8.9 - 10.3 mg/dL   GFR calc non Af Amer >60 >60 mL/min   GFR calc Af Amer >60 >60 mL/min    Comment: (NOTE) The eGFR has been calculated using the CKD EPI equation. This calculation has not been validated in all clinical situations. eGFR's persistently <60 mL/min  signify possible Chronic Kidney Disease.    Anion gap 5 5 - 15  CBC with Differential/Platelet     Status: Abnormal   Collection Time: 12/13/16  3:50 AM  Result Value Ref Range   WBC 12.3 (H) 4.0 - 10.5 K/uL   RBC 2.81 (L) 3.87 - 5.11 MIL/uL   Hemoglobin 8.6 (L) 12.0 - 15.0 g/dL   HCT 25.2 (L) 36.0 - 46.0 %   MCV 89.7 78.0 - 100.0 fL   MCH 30.6 26.0 - 34.0 pg   MCHC 34.1 30.0 - 36.0 g/dL   RDW 14.0 11.5 - 15.5 %   Platelets 96 (L) 150 - 400 K/uL    Comment: PLATELET COUNT CONFIRMED BY SMEAR REPEATED TO VERIFY    Neutrophils Relative % 89 %   Lymphocytes Relative 9 %   Monocytes Relative 2 %   Eosinophils Relative 0 %   Basophils Relative 0 %   Neutro Abs 11.0 (H) 1.7 - 7.7 K/uL   Lymphs Abs 1.1 0.7 - 4.0 K/uL   Monocytes Absolute 0.2 0.1 - 1.0 K/uL   Eosinophils Absolute 0.0 0.0 - 0.7 K/uL   Basophils Absolute 0.0  0.0 - 0.1 K/uL   WBC Morphology INCREASED BANDS (>20% BANDS)     Comment: MILD LEFT SHIFT (1-5% METAS, OCC MYELO, OCC BANDS) TOXIC GRANULATION DOHLE BODIES   Glucose, capillary     Status: None   Collection Time: 12/13/16  7:48 AM  Result Value Ref Range   Glucose-Capillary 65 65 - 99 mg/dL   Comment 1 Capillary Specimen    Comment 2 Notify RN   Glucose, capillary     Status: None   Collection Time: 12/13/16 11:35 AM  Result Value Ref Range   Glucose-Capillary 68 65 - 99 mg/dL   Comment 1 Capillary Specimen    Comment 2 Notify RN   Basic metabolic panel     Status: Abnormal   Collection Time: 12/13/16  3:50 PM  Result Value Ref Range   Sodium 138 135 - 145 mmol/L   Potassium 4.0 3.5 - 5.1 mmol/L   Chloride 113 (H) 101 - 111 mmol/L   CO2 22 22 - 32 mmol/L   Glucose, Bld 84 65 - 99 mg/dL   BUN 14 6 - 20 mg/dL   Creatinine, Ser 0.68 0.44 - 1.00 mg/dL   Calcium 7.5 (L) 8.9 - 10.3 mg/dL   GFR calc non Af Amer >60 >60 mL/min   GFR calc Af Amer >60 >60 mL/min    Comment: (NOTE) The eGFR has been calculated using the CKD EPI equation. This calculation has not been validated in all clinical situations. eGFR's persistently <60 mL/min signify possible Chronic Kidney Disease.    Anion gap 3 (L) 5 - 15  CBC     Status: Abnormal   Collection Time: 12/13/16  3:50 PM  Result Value Ref Range   WBC 13.0 (H) 4.0 - 10.5 K/uL   RBC 2.96 (L) 3.87 - 5.11 MIL/uL   Hemoglobin 9.0 (L) 12.0 - 15.0 g/dL   HCT 26.5 (L) 36.0 - 46.0 %   MCV 89.5 78.0 - 100.0 fL   MCH 30.4 26.0 - 34.0 pg   MCHC 34.0 30.0 - 36.0 g/dL   RDW 13.8 11.5 - 15.5 %   Platelets 102 (L) 150 - 400 K/uL    Comment: CONSISTENT WITH PREVIOUS RESULT  Magnesium     Status: None   Collection Time: 12/13/16  3:50 PM  Result Value Ref Range  Magnesium 2.0 1.7 - 2.4 mg/dL  Glucose, capillary     Status: None   Collection Time: 12/13/16  3:55 PM  Result Value Ref Range   Glucose-Capillary 71 65 - 99 mg/dL   Comment 1  Capillary Specimen   Glucose, capillary     Status: None   Collection Time: 12/13/16  7:56 PM  Result Value Ref Range   Glucose-Capillary 85 65 - 99 mg/dL   Comment 1 Capillary Specimen    Comment 2 Notify RN   BMET today at 2000     Status: Abnormal   Collection Time: 12/13/16  9:08 PM  Result Value Ref Range   Sodium 139 135 - 145 mmol/L   Potassium 3.9 3.5 - 5.1 mmol/L   Chloride 114 (H) 101 - 111 mmol/L   CO2 22 22 - 32 mmol/L   Glucose, Bld 89 65 - 99 mg/dL   BUN 13 6 - 20 mg/dL   Creatinine, Ser 0.64 0.44 - 1.00 mg/dL   Calcium 7.4 (L) 8.9 - 10.3 mg/dL   GFR calc non Af Amer >60 >60 mL/min   GFR calc Af Amer >60 >60 mL/min    Comment: (NOTE) The eGFR has been calculated using the CKD EPI equation. This calculation has not been validated in all clinical situations. eGFR's persistently <60 mL/min signify possible Chronic Kidney Disease.    Anion gap 3 (L) 5 - 15  Glucose, capillary     Status: None   Collection Time: 12/13/16 11:31 PM  Result Value Ref Range   Glucose-Capillary 80 65 - 99 mg/dL   Comment 1 Capillary Specimen    Comment 2 Notify RN   Glucose, capillary     Status: None   Collection Time: 12/14/16  3:43 AM  Result Value Ref Range   Glucose-Capillary 79 65 - 99 mg/dL   Comment 1 Notify RN   BMET in AM     Status: Abnormal   Collection Time: 12/14/16  4:05 AM  Result Value Ref Range   Sodium 140 135 - 145 mmol/L   Potassium 3.9 3.5 - 5.1 mmol/L   Chloride 114 (H) 101 - 111 mmol/L   CO2 22 22 - 32 mmol/L   Glucose, Bld 87 65 - 99 mg/dL   BUN 16 6 - 20 mg/dL   Creatinine, Ser 0.66 0.44 - 1.00 mg/dL   Calcium 7.6 (L) 8.9 - 10.3 mg/dL   GFR calc non Af Amer >60 >60 mL/min   GFR calc Af Amer >60 >60 mL/min    Comment: (NOTE) The eGFR has been calculated using the CKD EPI equation. This calculation has not been validated in all clinical situations. eGFR's persistently <60 mL/min signify possible Chronic Kidney Disease.    Anion gap 4 (L) 5 - 15   CBC     Status: Abnormal   Collection Time: 12/14/16  4:05 AM  Result Value Ref Range   WBC 13.3 (H) 4.0 - 10.5 K/uL   RBC 2.86 (L) 3.87 - 5.11 MIL/uL   Hemoglobin 8.7 (L) 12.0 - 15.0 g/dL   HCT 25.8 (L) 36.0 - 46.0 %   MCV 90.2 78.0 - 100.0 fL   MCH 30.4 26.0 - 34.0 pg   MCHC 33.7 30.0 - 36.0 g/dL   RDW 14.1 11.5 - 15.5 %   Platelets 114 (L) 150 - 400 K/uL    Comment: CONSISTENT WITH PREVIOUS RESULT  Magnesium     Status: None   Collection Time: 12/14/16  4:05 AM  Result Value Ref Range   Magnesium 2.0 1.7 - 2.4 mg/dL  Glucose, capillary     Status: None   Collection Time: 12/14/16  8:01 AM  Result Value Ref Range   Glucose-Capillary 85 65 - 99 mg/dL   Comment 1 Document in Chart   Glucose, capillary     Status: Abnormal   Collection Time: 12/14/16 11:34 AM  Result Value Ref Range   Glucose-Capillary 108 (H) 65 - 99 mg/dL   Comment 1 Document in Chart     Recent Results (from the past 240 hour(s))  Blood Culture (routine x 2)     Status: None (Preliminary result)   Collection Time: 12/09/16  5:17 PM  Result Value Ref Range Status   Specimen Description BLOOD LEFT HAND  Final   Special Requests   Final    Blood Culture adequate volume BOTTLES DRAWN AEROBIC ONLY   Culture NO GROWTH 4 DAYS  Final   Report Status PENDING  Incomplete  Urine culture     Status: None   Collection Time: 12/09/16  7:00 PM  Result Value Ref Range Status   Specimen Description URINE, CATHETERIZED  Final   Special Requests NONE  Final   Culture NO GROWTH  Final   Report Status 12/10/2016 FINAL  Final  Culture, blood (routine x 2)     Status: None (Preliminary result)   Collection Time: 12/09/16  7:01 PM  Result Value Ref Range Status   Specimen Description BLOOD LEFT ARM  Final   Special Requests IN PEDIATRIC BOTTLE Blood Culture adequate volume  Final   Culture NO GROWTH 4 DAYS  Final   Report Status PENDING  Incomplete  Culture, blood (routine x 2)     Status: None (Preliminary result)    Collection Time: 12/09/16  7:01 PM  Result Value Ref Range Status   Specimen Description BLOOD LEFT HAND  Final   Special Requests IN PEDIATRIC BOTTLE Blood Culture adequate volume  Final   Culture NO GROWTH 4 DAYS  Final   Report Status PENDING  Incomplete  MRSA PCR Screening     Status: None   Collection Time: 12/09/16  7:03 PM  Result Value Ref Range Status   MRSA by PCR NEGATIVE NEGATIVE Final    Comment:        The GeneXpert MRSA Assay (FDA approved for NASAL specimens only), is one component of a comprehensive MRSA colonization surveillance program. It is not intended to diagnose MRSA infection nor to guide or monitor treatment for MRSA infections.   Culture, respiratory (tracheal aspirate)     Status: None   Collection Time: 12/10/16 12:05 PM  Result Value Ref Range Status   Specimen Description TRACHEAL ASPIRATE  Final   Special Requests NONE  Final   Gram Stain   Final    FEW WBC PRESENT,BOTH PMN AND MONONUCLEAR ABUNDANT GRAM POSITIVE COCCI MODERATE BUDDING YEAST SEEN YEAST WITH PSEUDOHYPHAE FEW GRAM NEGATIVE RODS FEW GRAM NEGATIVE COCCOBACILLI    Culture FEW CANDIDA ALBICANS RARE KLEBSIELLA PNEUMONIAE   Final   Report Status 12/12/2016 FINAL  Final   Organism ID, Bacteria KLEBSIELLA PNEUMONIAE  Final      Susceptibility   Klebsiella pneumoniae - MIC*    AMPICILLIN RESISTANT Resistant     CEFAZOLIN <=4 SENSITIVE Sensitive     CEFEPIME <=1 SENSITIVE Sensitive     CEFTAZIDIME <=1 SENSITIVE Sensitive     CEFTRIAXONE <=1 SENSITIVE Sensitive     CIPROFLOXACIN <=0.25 SENSITIVE Sensitive  GENTAMICIN <=1 SENSITIVE Sensitive     IMIPENEM <=0.25 SENSITIVE Sensitive     TRIMETH/SULFA <=20 SENSITIVE Sensitive     AMPICILLIN/SULBACTAM 4 SENSITIVE Sensitive     PIP/TAZO <=4 SENSITIVE Sensitive     Extended ESBL NEGATIVE Sensitive     * RARE KLEBSIELLA PNEUMONIAE    Lipid Panel  Recent Labs  12/12/16 1444  TRIG 106    Studies/Results: No results  found.  Medications:   Current Facility-Administered Medications:  .  acetaminophen (TYLENOL) suppository 650 mg, 650 mg, Rectal, Q6H PRN, de Dios, Ranshaw A, MD, 650 mg at 12/10/16 0039 .  Ampicillin-Sulbactam (UNASYN) 3 g in sodium chloride 0.9 % 100 mL IVPB, 3 g, Intravenous, Q6H, McQuaid, Douglas B, MD, Stopped at 12/14/16 0801 .  6 CHG cloth bath night before surgery, , , Once **AND** 6 CHG cloth bath AM of surgery, , , Once **AND** Chlorhexidine Gluconate Cloth 2 % PADS 6 each, 6 each, Topical, Once **AND** [COMPLETED] Chlorhexidine Gluconate Cloth 2 % PADS 6 each, 6 each, Topical, Once, Cornett, Marcello Moores, MD, 6 each at 12/10/16 1130 .  Chlorhexidine Gluconate Cloth 2 % PADS 6 each, 6 each, Topical, Daily, Simonne Maffucci B, MD, 6 each at 12/14/16 1030 .  dextrose 5 %-0.9 % sodium chloride infusion, , Intravenous, Continuous, McQuaid, Douglas B, MD, Last Rate: 50 mL/hr at 12/14/16 1206 .  fentaNYL (SUBLIMAZE) injection 12.5-25 mcg, 12.5-25 mcg, Intravenous, Q2H PRN, Simonne Maffucci B, MD .  heparin injection 5,000 Units, 5,000 Units, Subcutaneous, Q8H, Sharia Reeve, MD, 5,000 Units at 12/14/16 0502 .  insulin aspart (novoLOG) injection 2-6 Units, 2-6 Units, Subcutaneous, Q4H, Sharia Reeve, MD, 2 Units at 12/10/16 2011 .  MEDLINE mouth rinse, 15 mL, Mouth Rinse, BID, McQuaid, Douglas B, MD, 15 mL at 12/14/16 0953 .  methylPREDNISolone sodium succinate (SOLU-MEDROL) 1,000 mg in sodium chloride 0.9 % 50 mL IVPB, 1,000 mg, Intravenous, Daily, Kerney Elbe, MD, Stopped at 12/14/16 1046 .  midazolam (VERSED) injection 1-2 mg, 1-2 mg, Intravenous, Q2H PRN, Halford Chessman, Vineet, MD .  ondansetron (ZOFRAN) injection 4 mg, 4 mg, Intravenous, Q6H PRN, Panchal, Amar, MD .  sodium chloride flush (NS) 0.9 % injection 10-40 mL, 10-40 mL, Intracatheter, Q12H, McQuaid, Douglas B, MD, 30 mL at 12/14/16 0948 .  sodium chloride flush (NS) 0.9 % injection 10-40 mL, 10-40 mL, Intracatheter, PRN, Juanito Doom,  MD  Facility-Administered Medications Ordered in Other Encounters:  .  0.9 %  sodium chloride infusion, , Intravenous, Once, Heath Lark, MD   Impression: 61 year old female withfocal neurological findings in the setting of severe hypotension on initial evaluation by Neurology in the ED. The focal deficits had all resolved with resolution of the hypotension. Now with diffuse weakness, right worse than left, in addition to neck weakness, thought possibly to be secondary to MS exacerbation.  1. MRI brain performed on 5/27 revealed no acute intracranial process on the motion degraded examination. Chronic findings consisting of severe chronic demyelination and mild parenchymal brain volume loss for age, disproportionately affecting the vermis, were noted. 2. Has had some improvement with correction of electrolytes, but still with diffuse weakness that is worse than her baseline. It would be reasonable to empirically treat her with IV pulsed dose steroids and assess for improvement. Potential benefits are felt to outweigh risks.   Recommendations: 1. Continue with OOB to chair on a daily basis, gradually increasing time out of bed towards a goal of regaining mobility.  2. PT/OT. 3. Solumedrol 1000 mg IV  qd x 5 days. Monitor for infection, elevated CBG and electrolyte changes.  .  LOS: 5 days   _0  signed: Dr. Kerney Elbe 12/14/2016  12:41 PM

## 2016-12-14 NOTE — Progress Notes (Signed)
[place] Progress Note Patient Name: KEVYN WENGERT DOB: October 25, 1955 MRN: [id number]   Date of Service  12/14/2016  HPI/Events of Note  Reviewed patient's post central venous line portable chest x-ray. Endotracheal tube approximately 6 and made above carina. Film rotated to the right slightly. This obscures the hilum. Tip of central venous catheter appears in distal SVC. No pneumothorax post placement.   eICU Interventions  1. Central venous catheter orders placed for line maintenance 2. Order placed for advancement of endotracheal tube 2 cm 3. Morning portable chest x-ray already ordered      Intervention Category Major Interventions: Respiratory failure - evaluation and management  Tera Partridge 12/14/2016, 8:15 PM

## 2016-12-14 NOTE — NC FL2 (Signed)
[place] LEVEL OF CARE SCREENING TOOL     IDENTIFICATION  Patient Name: Melissa Oconnell Birthdate: Aug 19, 1955 Sex: female Admission Date (Current Location): 12/09/2016  [facility] and [place] Number:  Herbalist and Address:  [facility] [place], [place] [address] [place],  [address]      Provider Number: [id number]  Attending Physician Name and Address:  Juanito Doom, MD  Relative Name and Phone Number:       Current Level of Care: Hospital Recommended Level of Care: [place] Prior Approval Number:    Date Approved/Denied:   PASRR Number: [id number]   Discharge Plan: SNF    Current Diagnoses: Patient Active Problem List   Diagnosis Date Noted  . Shock ([place]) 12/09/2016  . Left-sided weakness 12/09/2016  . Diarrhea 12/09/2016  . Preventive measure 04/05/2016  . Goals of care, counseling/discussion 04/05/2016  . Orthostatic hypotension 12/29/2014  . UTI (urinary tract infection) 12/29/2014  . Incisional pain 02/03/2014  . Osteoporosis 01/06/2014  . Breast cancer of upper-outer quadrant of right female breast ([place]) 12/02/2013  . Multiple sclerosis ([place]) 12/10/2012  . Memory loss 12/03/2012  . Accidental Fall 12/03/2012  . 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)  (Please see d/c summary)  AMBULATORY STATUS COMMUNICATION OF NEEDS Skin   Extensive Assist Verbally Wound Vac (Negative pressure wound therapy: abdomen 125 mmHg, continuous)                       Personal Care Assistance Level of Assistance  Bathing, Feeding, Dressing Bathing Assistance: Maximum assistance Feeding assistance: Limited assistance Dressing Assistance: Maximum assistance      Functional Limitations Info  Sight, Hearing, Speech Sight Info: Adequate Hearing Info: Adequate Speech Info: Impaired (Slurred/Dysarthria)    SPECIAL CARE FACTORS FREQUENCY  PT (By licensed PT), OT (By licensed OT)     PT Frequency: 3x/week OT Frequency: 3x/week            Contractures Contractures Info: Not present    Additional Factors Info  Code Status, Allergies Code Status Info: Full Code Allergies Info: No known allergies           Current Medications (12/14/2016):  This is the current hospital active medication list Current Facility-Administered Medications  Medication Dose Route Frequency Provider Last Rate Last Dose  . acetaminophen (TYLENOL) suppository 650 mg  650 mg Rectal Q6H PRN de Dios, Mike Gip, MD   650 mg at 12/10/16 0039  . Ampicillin-Sulbactam (UNASYN) 3 g in sodium chloride 0.9 % 100 mL IVPB  3 g Intravenous Q6H Juanito Doom, MD   Stopped at 12/14/16 0801  . Chlorhexidine Gluconate Cloth 2 % PADS 6 each  6 each Topical Once Cornett, Thomas, MD      . Chlorhexidine Gluconate Cloth 2 % PADS 6 each  6 each Topical Daily McQuaid, Douglas B, MD      . dextrose 5 %-0.9 % sodium chloride infusion   Intravenous Continuous Simonne Maffucci B, MD 50 mL/hr at 12/13/16 1310    . fentaNYL (SUBLIMAZE) injection 12.5-25 mcg  12.5-25 mcg Intravenous Q2H PRN Simonne Maffucci B, MD      . heparin injection 5,000  Units  5,000 Units Subcutaneous Q8H Sharia Reeve, MD   5,000 Units at 12/14/16 0502  . insulin aspart (novoLOG) injection 2-6 Units  2-6 Units Subcutaneous Q4H Sharia Reeve, MD   2 Units at 12/10/16 2011  . MEDLINE mouth rinse  15 mL Mouth Rinse BID Simonne Maffucci B, MD   15 mL at 12/14/16 0953  . methylPREDNISolone sodium succinate (SOLU-MEDROL) 1,000 mg in sodium chloride 0.9 % 50 mL IVPB  1,000 mg Intravenous Daily Kerney Elbe, MD 58 mL/hr at 12/14/16 0946 1,000 mg at 12/14/16 0946  . midazolam (VERSED) injection 1-2 mg  1-2 mg Intravenous  Q2H PRN Chesley Mires, MD      . ondansetron (ZOFRAN) injection 4 mg  4 mg Intravenous Q6H PRN Sharia Reeve, MD      . sodium chloride flush (NS) 0.9 % injection 10-40 mL  10-40 mL Intracatheter Q12H Simonne Maffucci B, MD   30 mL at 12/14/16 0948  . sodium chloride flush (NS) 0.9 % injection 10-40 mL  10-40 mL Intracatheter PRN Juanito Doom, MD       Facility-Administered Medications Ordered in Other Encounters  Medication Dose Route Frequency Provider Last Rate Last Dose  . 0.9 %  sodium chloride infusion   Intravenous Once Heath Lark, MD         Discharge Medications: Please see discharge summary for a list of discharge medications.  Relevant Imaging Results:  Relevant Lab Results:   Additional Information SSN: [id number]  Eileen Stanford, LCSW

## 2016-12-14 NOTE — Progress Notes (Signed)
CRITICAL VALUE ALERT  Critical Value: Lactic Acid 8.5  Date & Time Notied: 12/14/16 2100  Provider Notified: Dr. Ashok Cordia  Orders Received/Actions taken: Yes

## 2016-12-14 NOTE — Evaluation (Signed)
Clinical/Bedside Swallow Evaluation Patient Details  Name: Melissa Oconnell MRN: [id number] Date of Birth: [birth year]  Today's Date: 12/14/2016 Time: SLP Start Time (ACUTE ONLY): 41 SLP Stop Time (ACUTE ONLY): 1033 SLP Time Calculation (min) (ACUTE ONLY): 14 min  Past Medical History:  Past Medical History:  Diagnosis Date  . Abnormality of gait   . Cancer ([place])    breast  . Memory loss   . Multiple sclerosis ([place])   . Neuromuscular disorder ([place])    multiple sclerosis  . Osteoporosis, unspecified 01/06/2014   Past Surgical History:  Past Surgical History:  Procedure Laterality Date  . DIAGNOSTIC LAPAROSCOPY     "swallowed something"  . LAPAROTOMY N/A 12/10/2016   Procedure: EXPLORATORY LAPAROTOMY WITH SIGMOID COLECTOMY ANT HARTMAN'S POUCH;  Surgeon: Erroll Luna, MD;  Location: [facility];  Service: General;  Laterality: N/A;  . MASTECTOMY Right   . MASTECTOMY W/ SENTINEL NODE BIOPSY Right 12/02/2013   Procedure: TOTAL MASTECTOMY WITH SENTINEL LYMPH NODE BIOPSY;  Surgeon: Adin Hector, MD;  Location: [facility];  Service: General;  Laterality: Right;  . TONSILLECTOMY     HPI:  61 yo with h/o MS and breast CA admitted with bowel perforation s/p ostomy with VAC and significant weakness. VDRF 5/26-5/30.    Assessment / Plan / Recommendation Clinical Impression  Patient presents with a moderate oral dysphagia characterized by oral weakness resulting in decreased labial seal, weak lingual manipulation of bolus, and prolonged oral transit. Pharyngeally, patient with sluggish appearing hyo-laryngeal movement upon palpation but no overt indication of aspiration with vocal quality remaining baseline during po trials. Suspect some degree of baseline dysphagia with MS, exacerbated by acute deconditioning although at this time, apteint appears to be protecting airway. Concern for silent aspiration remains given 4 day intubation, hoarse vocal quality indicating decreased glottal closure (patient  reports baseline), and weak cough response. Will f/u closely. SLP Visit Diagnosis: Dysphagia, oropharyngeal phase (R13.12)    Aspiration Risk  Moderate aspiration risk    Diet Recommendation Dysphagia 1 (Puree);Thin liquid (when ready to advance diet from a GI standpoint)   Liquid Administration via: Cup;Straw Medication Administration: Crushed with puree Supervision: Patient able to self feed;Staff to assist with self feeding;Full supervision/cueing for compensatory strategies Compensations: Slow rate;Small sips/bites Postural Changes: Remain upright for at least 30 minutes after po intake    Other  Recommendations Oral Care Recommendations: Oral care BID   Follow up Recommendations None      Frequency and Duration min 2x/week  1 week       Prognosis Prognosis for Safe Diet Advancement: Good      Swallow Study   General HPI: 61 yo with h/o MS and breast CA admitted with bowel perforation s/p ostomy with VAC and significant weakness. VDRF 5/26-5/30.  Type of Study: Bedside Swallow Evaluation Previous Swallow Assessment: none noted Diet Prior to this Study: Thin liquids (clear liquids) Temperature Spikes Noted: No Respiratory Status: Room air History of Recent Intubation: Yes Length of Intubations (days): 4 days Date extubated: 12/13/16 Behavior/Cognition: Cooperative;Pleasant mood;Lethargic/Drowsy Oral Cavity Assessment: Within Functional Limits Oral Care Completed by SLP: Recent completion by staff Oral Cavity - Dentition: Dentures, not available Vision: Functional for self-feeding Self-Feeding Abilities: Able to feed self;Needs assist Patient Positioning: Upright in chair Baseline Vocal Quality: Hoarse;Low vocal intensity (reports that this is baseline) Volitional Cough: Weak Volitional Swallow: Unable to elicit    Oral/Motor/Sensory Function Overall Oral Motor/Sensory Function: Generalized oral weakness   Ice Chips Ice chips: Not tested  Thin Liquid Thin Liquid:  Impaired Presentation: Cup;Self Fed;Straw Oral Phase Impairments: Reduced labial seal;Reduced lingual movement/coordination Oral Phase Functional Implications: Prolonged oral transit Pharyngeal  Phase Impairments: Other (comments) (sluggish appearing hyo-laryngeal movement)    Nectar Thick Nectar Thick Liquid: Not tested   Honey Thick Honey Thick Liquid: Not tested   Puree Puree: Impaired Presentation: Spoon Oral Phase Impairments: Impaired mastication;Reduced lingual movement/coordination Oral Phase Functional Implications: Prolonged oral transit   Solid   GO   Solid: Impaired Oral Phase Impairments: Impaired mastication;Reduced lingual movement/coordination Oral Phase Functional Implications: Prolonged oral transit;Impaired mastication;Oral residue (severely delayed oral transit)       Kaisyn Reinhold MA, CCC-SLP [phone]  Izabella Marcantel Meryl 12/14/2016,10:39 AM

## 2016-12-14 NOTE — Anesthesia Procedure Notes (Signed)
Procedures

## 2016-12-14 NOTE — Care Management Note (Signed)
Case Management Note  Patient Details  Name: MICHALINA CALBERT MRN: [id number] Date of Birth: May 09, 1956  Subjective/Objective:   Pt admitted with Left side weakness - found to have bowel perf -  Now with wound vac and ostomy                   Action/Plan:  PTA lived with caregiver [name] [facility]) Per POA pt is semi independent with ADLs.  Pt extubated today.  PT eval ordered for pt due to additional medical complexity.    Expected Discharge Date:  12/19/16               Expected Discharge Plan:     In-House Referral:     Discharge planning Services  CM Consult  Post Acute Care Choice:    Choice offered to:     DME Arranged:    DME Agency:     HH Arranged:    HH Agency:     Status of Service:     If discussed at [employer], dates discussed:    Additional Comments: CIR recommended - CSW consulted for back up plan of SNF Maryclare Labrador, RN 12/14/2016, 9:30 AM

## 2016-12-14 NOTE — Evaluation (Signed)
Physical Therapy Evaluation Patient Details Name: Melissa Oconnell MRN: [id number] DOB: April 25, 1956 Today's Date: 12/14/2016   History of Present Illness  61 yo with h/o MS and breast CA admitted with bowel perforation s/p ostomy with VAC and significant weakness. VDRF 5/26-5/30  Clinical Impression  Pt pleasant with flat affect, difficult to understand mumbled speech who lives with a 24hr caregiver and normally walks 91' mod I with RW. Pt very motivated to move and return to PLOF. Pt with generalized weakness without significant different Right vs Left both 3/5 today and very kyphotic posture. Pt with decreased strength, balance, cognition, transfers, gait, and functional mobility who will benefit from acute therapy to maximize strength, function and balance to decrease burden of care and return to prior min assist level and walking short distances in home.  Hr 81 sats 95% on RA Unable to obtain seated BP Lying BP 95/66 (76)    Follow Up Recommendations CIR;Supervision/Assistance - 24 hour    Equipment Recommendations  None recommended by PT    Recommendations for Other Services Rehab consult;OT consult;Speech consult     Precautions / Restrictions Precautions Precautions: Fall Precaution Comments: VAC, jp drain      Mobility  Bed Mobility Overal bed mobility: Needs Assistance Bed Mobility: Supine to Sit     Supine to sit: Mod assist;HOB elevated     General bed mobility comments: cues for sequence with assist to initiate bil LE movement and use of HHA to elevate trunk, mod assist to scoot to EOB with pad  Transfers Overall transfer level: Needs assistance   Transfers: Sit to/from Stand;Stand Pivot Transfers Sit to Stand: Max assist;Mod assist Stand pivot transfers: Mod assist       General transfer comment: initial stand from elevated bed max assist of 2 with knees blocked and belt. Pt with improved sitting balance after standing. 2nd standing trial mod assist with  pt initiating and knees not buckling with bil UE support to pivot to chair  Ambulation/Gait                Stairs            Wheelchair Mobility    Modified Rankin (Stroke Patients Only)       Balance Overall balance assessment: Needs assistance   Sitting balance-Leahy Scale: Zero Sitting balance - Comments: pt progressed from max assist EOB with posterior left lean initially to minguard with flexed trunk after standing. EOB grossly 8 min Postural control: Posterior lean;Left lateral lean   Standing balance-Leahy Scale: Poor                               Pertinent Vitals/Pain Pain Assessment: No/denies pain    Home Living Family/patient expects to be discharged to:: Private residence Living Arrangements: Non-relatives/Friends Available Help at Discharge: Personal care attendant;Available 24 hours/day Type of Home: House Home Access: Ramped entrance     Home Layout: One level Home Equipment: Walker - 2 wheels;Cane - single point;Bedside commode;Shower seat;Wheelchair - manual;Grab bars - tub/shower      Prior Function Level of Independence: Needs assistance   Gait / Transfers Assistance Needed: used walker in home 15', WC for community, stood and transferred without assist  ADL's / Homemaking Assistance Needed: assist for lower body bathing, sits to shower, dresses on her own after [name] lays out her clothes        Hand Dominance  Extremity/Trunk Assessment   Upper Extremity Assessment Upper Extremity Assessment: Generalized weakness    Lower Extremity Assessment Lower Extremity Assessment: Generalized weakness (3/5 bil hip flexion, knee extension and dorsiflexion)    Cervical / Trunk Assessment Cervical / Trunk Assessment: Kyphotic (very forward and flexed neck, unable to extend neck)  Communication   Communication: Expressive difficulties  Cognition Arousal/Alertness: Awake/alert Behavior During Therapy: Flat  affect Overall Cognitive Status: History of cognitive impairments - at baseline                                 General Comments: pt stating it is March of 2016. Aware of caregiver upon entry and providing correct PLOF per caregiver. [name] states pt is not normally oriented to time and has cognitive deficits      General Comments      Exercises     Assessment/Plan    PT Assessment Patient needs continued PT services  PT Problem List Decreased strength;Decreased mobility;Decreased safety awareness;Decreased activity tolerance;Decreased balance;Decreased cognition;Decreased skin integrity;Decreased range of motion       PT Treatment Interventions Gait training;Therapeutic exercise;Patient/family education;Balance training;Functional mobility training;Wheelchair mobility training;Neuromuscular re-education;DME instruction;Therapeutic activities;Cognitive remediation    PT Goals (Current goals can be found in the Care Plan section)  Acute Rehab PT Goals Patient Stated Goal: return home, walk PT Goal Formulation: With patient/family Time For Goal Achievement: 12/28/16 Potential to Achieve Goals: Fair    Frequency Min 3X/week   Barriers to discharge        Co-evaluation               AM-PAC PT "6 Clicks" Daily Activity  Outcome Measure Difficulty turning over in bed (including adjusting bedclothes, sheets and blankets)?: A Lot Difficulty moving from lying on back to sitting on the side of the bed? : Total Difficulty sitting down on and standing up from a chair with arms (e.g., wheelchair, bedside commode, etc,.)?: Total Help needed moving to and from a bed to chair (including a wheelchair)?: Total Help needed walking in hospital room?: Total Help needed climbing 3-5 steps with a railing? : Total 6 Click Score: 7    End of Session Equipment Utilized During Treatment: Gait belt Activity Tolerance: Patient tolerated treatment well Patient left: in  chair;with call bell/phone within reach;with chair alarm set;with family/visitor present Nurse Communication: Mobility status;Precautions PT Visit Diagnosis: Unsteadiness on feet (R26.81);Muscle weakness (generalized) (M62.81);Other abnormalities of gait and mobility (R26.89);Difficulty in walking, not elsewhere classified (R26.2)    Time: 1583-0940 PT Time Calculation (min) (ACUTE ONLY): 25 min   Charges:   PT Evaluation $PT Eval Moderate Complexity: 1 Procedure PT Treatments $Therapeutic Activity: 8-22 mins   PT G Codes:        Elwyn Reach, PT [phone]   [place] B Lonia Roane 12/14/2016, 9:11 AM

## 2016-12-14 NOTE — Progress Notes (Signed)
4 Days Post-Op   Subjective/Chief Complaint: CC has done well since extubation, pain control OK   Objective: Vital signs in last 24 hours: Temp:  [97.3 F (36.3 C)-100.1 F (37.8 C)] 97.3 F (36.3 C) (05/31 0800) Pulse Rate:  [80-104] 85 (05/31 0600) Resp:  [17-41] 39 (05/31 0700) BP: (70-112)/(49-83) 85/65 (05/31 0700) SpO2:  [93 %-100 %] 99 % (05/31 0600) FiO2 (%):  [30 %] 30 % (05/30 0905) Weight:  [58.8 kg (129 lb 10.1 oz)] 58.8 kg (129 lb 10.1 oz) (05/31 0347) Last BM Date: 12/13/16  Intake/Output from previous day: 05/30 0701 - 05/31 0700 In: 1777.2 [I.V.:1177.2; IV Piggyback:600] Out: 610 [Urine:425; Drains:35; Stool:150] Intake/Output this shift: No intake/output data recorded.  General appearance: alert and cooperative Resp: clear to auscultation bilaterally GI: soft, VAC on midline, stool and aor in bag, JP purulent  Lab Results:   Recent Labs  12/13/16 1550 12/14/16 0405  WBC 13.0* 13.3*  HGB 9.0* 8.7*  HCT 26.5* 25.8*  PLT 102* 114*   BMET  Recent Labs  12/13/16 2108 12/14/16 0405  NA 139 140  K 3.9 3.9  CL 114* 114*  CO2 22 22  GLUCOSE 89 87  BUN 13 16  CREATININE 0.64 0.66  CALCIUM 7.4* 7.6*   PT/INR No results for input(s): LABPROT, INR in the last 72 hours. ABG No results for input(s): PHART, HCO3 in the last 72 hours.  Invalid input(s): PCO2, PO2  Studies/Results: No results found.  Anti-infectives: Anti-infectives    Start     Dose/Rate Route Frequency Ordered Stop   12/12/16 1430  Ampicillin-Sulbactam (UNASYN) 3 g in sodium chloride 0.9 % 100 mL IVPB     3 g 200 mL/hr over 30 Minutes Intravenous Every 6 hours 12/12/16 1402 12/17/16 1429   12/11/16 2230  vancomycin (VANCOCIN) 500 mg in sodium chloride 0.9 % 100 mL IVPB  Status:  Discontinued     500 mg 100 mL/hr over 60 Minutes Intravenous Every 12 hours 12/11/16 1118 12/12/16 0956   12/11/16 1030  vancomycin (VANCOCIN) 500 mg in sodium chloride 0.9 % 100 mL IVPB  Status:   Discontinued     500 mg 100 mL/hr over 60 Minutes Intravenous Every 12 hours 12/11/16 0944 12/11/16 1115   12/10/16 1700  vancomycin (VANCOCIN) IVPB 750 mg/150 ml premix  Status:  Discontinued     750 mg 150 mL/hr over 60 Minutes Intravenous Every 12 hours 12/09/16 1532 12/10/16 1025   12/10/16 1700  vancomycin (VANCOCIN) IVPB 750 mg/150 ml premix  Status:  Discontinued     750 mg 150 mL/hr over 60 Minutes Intravenous Every 24 hours 12/10/16 1025 12/11/16 0944   12/10/16 0000  piperacillin-tazobactam (ZOSYN) IVPB 3.375 g  Status:  Discontinued     3.375 g 12.5 mL/hr over 240 Minutes Intravenous Every 8 hours 12/09/16 1532 12/12/16 1402   12/09/16 1530  piperacillin-tazobactam (ZOSYN) IVPB 3.375 g     3.375 g 100 mL/hr over 30 Minutes Intravenous  Once 12/09/16 1520 12/09/16 1659   12/09/16 1530  vancomycin (VANCOCIN) IVPB 1000 mg/200 mL premix     1,000 mg 200 mL/hr over 60 Minutes Intravenous  Once 12/09/16 1520 12/09/16 1801      Assessment/Plan: Perforated sigmoid colon from sigmoid diverticulitis with fecal peritonitis and Hinchey 4 Exploratory laparotomy with sigmoid colectomy and formation of colostomy with lysis of adhesions, 12/10/16, Dr. Marcello Moores [place]  Shock secondary to sepsis Acute metabolic encephalopathy  Some history of dementia Multiple sclerosis FEN:  start  clears ID:  Zosyn/Vancomycin 5/26 =>> day 6 DVT: Heparin I D/W Dr. [place] Bells. Does not seem like she is having a MS flare.  LOS: 5 days    Delrae Hagey E 12/14/2016

## 2016-12-14 NOTE — Progress Notes (Signed)
   12/14/16 1840  Clinical Encounter Type  Visited With Family  Visit Type Code  Spiritual Encounters  Spiritual Needs Emotional  Stress Factors  Patient Stress Factors Not reviewed  Family Stress Factors Family relationships  Responded to [employer]. Family member present. [employer] of presence. Pt stabilized.

## 2016-12-14 NOTE — Procedures (Signed)
Central Venous Catheter Insertion Procedure Note Melissa Oconnell [id number] 1956-06-08  Procedure: Insertion of Central Venous Catheter Indications: Assessment of intravascular volume, Drug and/or fluid administration and Frequent blood sampling  Procedure Details Consent: Risks of procedure as well as the alternatives and risks of each were explained to the (patient/caregiver).  Consent for procedure obtained. Time Out: Verified patient identification, verified procedure, site/side was marked, verified correct patient position, special equipment/implants available, medications/allergies/relevent history reviewed, required imaging and test results available.  Performed  Maximum sterile technique was used including antiseptics, cap, gloves, gown, hand hygiene, mask and sheet. Skin prep: Chlorhexidine; local anesthetic administered A antimicrobial bonded/coated triple lumen catheter was placed in the right internal jugular vein using the Seldinger technique.  Evaluation Blood flow good Complications: No apparent complications Patient did tolerate procedure well. Chest X-ray ordered to verify placement.  CXR: pending.  Hayden Pedro, AGACNP-BC [facility]  Pgr: [phone]  PCCM Pgr: [phone]

## 2016-12-14 NOTE — Consult Note (Signed)
Physical Medicine and Rehabilitation Consult   Reason for Consult: Debility Referring Physician: Dr. Lake Bells   HPI: Melissa Oconnell is a 61 y.o. female with history of MS with gait disorder and dementia, breast cancer ? COPD, who was admitted on 12/09/16 with mental status changes with inability to speak, left  sided weakness, respiratory distress and hypotension. She was intubated in ED and started on fluids and pressors with improved ability to use left side.  CT head done without acute changes. CTA without significant stenosis and fetal origin bilateral PCA. She was found to have coffee ground emesis from NGT and started on IV antibiotics for septic shock. Abdominal tenderness noted and CT abdomen done revealing dilated fluid filled small bowel with presence of free air. She was taken to OR emergently on 5/27 for exploratory lap with lysis of adhesions, sigmoid colectomy and formation of colostomy by Dr. Brantley Stage. Antibiotics narrowed to Unasyn for treatment of Klebsiella CAP.   MRI brain negative for stroke. She was slow to extubate due to severe diffuse weakness and neurology felt symptoms not due to MS exacerbation but could trial steroids. She was started on pulse dose  IV steroids and tolerated extubation on 5/30 and noted to be diffusely weak on PT evaluation yesterday. At baseline, she has 24 hours caregiver and was able to ambulate about 23' with RW at mod independent level and needed assistance with ADL tasks. Currently with significant decline in mobility and ability to carry out ADL tasks. CIR recommended for follow up therapy.    Review of Systems  HENT: Negative for hearing loss and tinnitus.   Eyes: Negative for blurred vision and double vision.  Respiratory: Negative for cough, shortness of breath and wheezing.        Uses inhalers at home?  Cardiovascular: Negative for chest pain and palpitations.  Gastrointestinal: Negative for abdominal pain and nausea.  Genitourinary:  Negative for dysuria and urgency.       Incontinent of bladder  Musculoskeletal: Negative for myalgias.  Skin: Negative for itching and rash.  Neurological: Positive for focal weakness (chronic RLE weakness with knee instability--uses a brace.) and weakness. Negative for dizziness and headaches.  Psychiatric/Behavioral: Positive for memory loss. The patient is not nervous/anxious.       Past Medical History:  Diagnosis Date  . Abnormality of gait   . Cancer ([place])    breast  . Memory loss   . Multiple sclerosis ([place])   . Neuromuscular disorder ([employer])    multiple sclerosis  . Osteoporosis, unspecified 01/06/2014    Past Surgical History:  Procedure Laterality Date  . DIAGNOSTIC LAPAROSCOPY     "swallowed something"  . LAPAROTOMY N/A 12/10/2016   Procedure: EXPLORATORY LAPAROTOMY WITH SIGMOID COLECTOMY ANT HARTMAN'S POUCH;  Surgeon: Erroll Luna, MD;  Location: [facility];  Service: General;  Laterality: N/A;  . MASTECTOMY Right   . MASTECTOMY W/ SENTINEL NODE BIOPSY Right 12/02/2013   Procedure: TOTAL MASTECTOMY WITH SENTINEL LYMPH NODE BIOPSY;  Surgeon: Adin Hector, MD;  Location: [facility];  Service: General;  Laterality: Right;  . TONSILLECTOMY      Family History  Problem Relation Age of Onset  . Cancer Mother        breast    Social History:  Has been living with her friend/caregiver for past 6-7 years.   reports that she has quit smoking. Her smoking use included Cigarettes. She has a 5.00 pack-year smoking history. She has never used smokeless  tobacco. She reports that she drinks alcohol. Has history of cocaine use--quit 2011.     Allergies: No Known Allergies    Medications Prior to Admission  Medication Sig Dispense Refill  . acetaminophen (TYLENOL) 325 MG tablet Take 325 mg by mouth every 6 (six) hours as needed for mild pain or moderate pain. Reported on 10/04/2015    . Calcium Carb-Cholecalciferol (CALCIUM 600 + D) 600-200 MG-UNIT TABS Take 1 tablet by mouth at  bedtime.     . docusate sodium (COLACE) 100 MG capsule Take 100 mg by mouth 3 (three) times daily as needed (constipation).    Marland Kitchen donepezil (ARICEPT) 10 MG tablet Take 1 tablet (10 mg total) by mouth at bedtime. 30 tablet 8  . ENSURE (ENSURE) Take 237 mLs by mouth 3 (three) times daily.     . polyethylene glycol (MIRALAX / GLYCOLAX) packet Take 17 g by mouth daily as needed (constipation). Mix in 8 oz liquid and drink      Home: Home Living Family/patient expects to be discharged to:: Private residence Living Arrangements: Non-relatives/Friends Available Help at Discharge: Personal care attendant, Available 24 hours/day Type of Home: House Home Access: Ramped entrance [place]: One level Bathroom Shower/Tub: Multimedia programmer: [place]: Environmental consultant - 2 wheels, [employer] - single point, Bedside commode, Shower seat, Wheelchair - manual, Grab bars - tub/shower  Functional History: Prior Function Level of Independence: Needs assistance Gait / Transfers Assistance Needed: used walker in home 15', WC for community, stood and transferred without assist ADL's / [place] Needed: assist for lower body bathing, sits to shower, dresses on her own after [name] lays out her clothes Functional Status:  Mobility: Bed Mobility Overal bed mobility: Needs Assistance Bed Mobility: Supine to Sit Supine to sit: Mod assist, HOB elevated General bed mobility comments: cues for sequence with assist to initiate bil LE movement and use of HHA to elevate trunk, mod assist to scoot to EOB with pad Transfers Overall transfer level: Needs assistance Transfers: Sit to/from Stand, Stand Pivot Transfers Sit to Stand: Max assist, Mod assist Stand pivot transfers: Mod assist General transfer comment: initial stand from elevated bed max assist of 2 with knees blocked and belt. Pt with improved sitting balance after standing. 2nd standing trial mod assist with pt initiating and knees  not buckling with bil UE support to pivot to chair      ADL:    Cognition: Cognition Overall Cognitive Status: History of cognitive impairments - at baseline Orientation Level: (P) Oriented to person Cognition Arousal/Alertness: Awake/alert Behavior During Therapy: Flat affect Overall Cognitive Status: History of cognitive impairments - at baseline General Comments: pt stating it is March of 2016. Aware of caregiver upon entry and providing correct PLOF per caregiver. [name] states pt is not normally oriented to time and has cognitive deficits  Blood pressure 95/66, pulse 81, temperature 97.3 F (36.3 C), temperature source Oral, resp. rate (!) 32, height 5\' 8"  (1.727 m), weight 58.8 kg (129 lb 10.1 oz), SpO2 95 %. Physical Exam  Nursing note and vitals reviewed. Constitutional: She is oriented to person, place, and time.  Thin, ill appearing female.  HENT:  Head: Normocephalic and atraumatic.  Eyes: Conjunctivae are normal. Pupils are equal, round, and reactive to light.  Bilateral Exophthalmus.   Neck:  Keeps neck flexed.    Cardiovascular: Normal rate and regular rhythm.   Respiratory: Effort normal. No stridor. No respiratory distress. She has decreased breath sounds.  GI: Soft. She exhibits  distension. Bowel sounds are decreased. There is no tenderness.  VAC in place  Musculoskeletal: She exhibits no tenderness.  Neurological: She is alert and oriented to person, place, and time.  Speaks in a whisper. Able to answer basic questions--mild delay initially. Unable to recall Pres "[place]an".   She is able to follow simple motor commands without difficulty. Essential tremor noted. UE 3/5 prox to distal with right side being a bit stronger than the left. LE: 2/5 HF, 2+KE and 3/5 ADF/PF.   Skin: Skin is warm and dry. No rash noted. No erythema.  Psychiatric: She has a normal mood and affect. Her speech is delayed. She is slowed.    Results for orders placed or performed during the  hospital encounter of 12/09/16 (from the past 24 hour(s))  Glucose, capillary     Status: None   Collection Time: 12/13/16 11:35 AM  Result Value Ref Range   Glucose-Capillary 68 65 - 99 mg/dL   Comment 1 Capillary Specimen    Comment 2 Notify RN   Basic metabolic panel     Status: Abnormal   Collection Time: 12/13/16  3:50 PM  Result Value Ref Range   Sodium 138 135 - 145 mmol/L   Potassium 4.0 3.5 - 5.1 mmol/L   Chloride 113 (H) 101 - 111 mmol/L   CO2 22 22 - 32 mmol/L   Glucose, Bld 84 65 - 99 mg/dL   BUN 14 6 - 20 mg/dL   Creatinine, Ser 0.68 0.44 - 1.00 mg/dL   Calcium 7.5 (L) 8.9 - 10.3 mg/dL   GFR calc non Af Amer >60 >60 mL/min   GFR calc Af Amer >60 >60 mL/min   Anion gap 3 (L) 5 - 15  CBC     Status: Abnormal   Collection Time: 12/13/16  3:50 PM  Result Value Ref Range   WBC 13.0 (H) 4.0 - 10.5 K/uL   RBC 2.96 (L) 3.87 - 5.11 MIL/uL   Hemoglobin 9.0 (L) 12.0 - 15.0 g/dL   HCT 26.5 (L) 36.0 - 46.0 %   MCV 89.5 78.0 - 100.0 fL   MCH 30.4 26.0 - 34.0 pg   MCHC 34.0 30.0 - 36.0 g/dL   RDW 13.8 11.5 - 15.5 %   Platelets 102 (L) 150 - 400 K/uL  Magnesium     Status: None   Collection Time: 12/13/16  3:50 PM  Result Value Ref Range   Magnesium 2.0 1.7 - 2.4 mg/dL  Glucose, capillary     Status: None   Collection Time: 12/13/16  3:55 PM  Result Value Ref Range   Glucose-Capillary 71 65 - 99 mg/dL   Comment 1 Capillary Specimen   Glucose, capillary     Status: None   Collection Time: 12/13/16  7:56 PM  Result Value Ref Range   Glucose-Capillary 85 65 - 99 mg/dL   Comment 1 Capillary Specimen    Comment 2 Notify RN   BMET today at 2000     Status: Abnormal   Collection Time: 12/13/16  9:08 PM  Result Value Ref Range   Sodium 139 135 - 145 mmol/L   Potassium 3.9 3.5 - 5.1 mmol/L   Chloride 114 (H) 101 - 111 mmol/L   CO2 22 22 - 32 mmol/L   Glucose, Bld 89 65 - 99 mg/dL   BUN 13 6 - 20 mg/dL   Creatinine, Ser 0.64 0.44 - 1.00 mg/dL   Calcium 7.4 (L) 8.9 - 10.3  mg/dL  GFR calc non Af Amer >60 >60 mL/min   GFR calc Af Amer >60 >60 mL/min   Anion gap 3 (L) 5 - 15  Glucose, capillary     Status: None   Collection Time: 12/13/16 11:31 PM  Result Value Ref Range   Glucose-Capillary 80 65 - 99 mg/dL   Comment 1 Capillary Specimen    Comment 2 Notify RN   Glucose, capillary     Status: None   Collection Time: 12/14/16  3:43 AM  Result Value Ref Range   Glucose-Capillary 79 65 - 99 mg/dL   Comment 1 Notify RN   BMET in AM     Status: Abnormal   Collection Time: 12/14/16  4:05 AM  Result Value Ref Range   Sodium 140 135 - 145 mmol/L   Potassium 3.9 3.5 - 5.1 mmol/L   Chloride 114 (H) 101 - 111 mmol/L   CO2 22 22 - 32 mmol/L   Glucose, Bld 87 65 - 99 mg/dL   BUN 16 6 - 20 mg/dL   Creatinine, Ser 0.66 0.44 - 1.00 mg/dL   Calcium 7.6 (L) 8.9 - 10.3 mg/dL   GFR calc non Af Amer >60 >60 mL/min   GFR calc Af Amer >60 >60 mL/min   Anion gap 4 (L) 5 - 15  CBC     Status: Abnormal   Collection Time: 12/14/16  4:05 AM  Result Value Ref Range   WBC 13.3 (H) 4.0 - 10.5 K/uL   RBC 2.86 (L) 3.87 - 5.11 MIL/uL   Hemoglobin 8.7 (L) 12.0 - 15.0 g/dL   HCT 25.8 (L) 36.0 - 46.0 %   MCV 90.2 78.0 - 100.0 fL   MCH 30.4 26.0 - 34.0 pg   MCHC 33.7 30.0 - 36.0 g/dL   RDW 14.1 11.5 - 15.5 %   Platelets 114 (L) 150 - 400 K/uL  Magnesium     Status: None   Collection Time: 12/14/16  4:05 AM  Result Value Ref Range   Magnesium 2.0 1.7 - 2.4 mg/dL  Glucose, capillary     Status: None   Collection Time: 12/14/16  8:01 AM  Result Value Ref Range   Glucose-Capillary 85 65 - 99 mg/dL   Comment 1 Document in Chart    No results found.  Assessment/Plan: Diagnosis: functional and mobility deficits after perforated bowel. Pt with history of MS 1. Does the need for close, 24 hr/day medical supervision in concert with the patient's rehab needs make it unreasonable for this patient to be served in a less intensive setting? Yes 2. Co-Morbidities requiring  supervision/potential complications: CV mgt/hypotension, pain mgt, wound care, 3. Due to bladder management, bowel management, safety, skin/wound care, disease management, medication administration, pain management and patient education, does the patient require 24 hr/day rehab nursing? Yes 4. Does the patient require coordinated care of a physician, rehab nurse, PT (1-2 hrs/day, 5 days/week), OT (1-2 hrs/day, 5 days/week) and SLP (1-2 hrs/day, 5 days/week) to address physical and functional deficits in the context of the above medical diagnosis(es)? Yes Addressing deficits in the following areas: balance, endurance, locomotion, strength, transferring, bowel/bladder control, bathing, dressing, feeding, grooming, toileting, cognition and psychosocial support 5. Can the patient actively participate in an intensive therapy program of at least 3 hrs of therapy per day at least 5 days per week? Yes 6. The potential for patient to make measurable gains while on inpatient rehab is excellent 7. Anticipated functional outcomes upon discharge from inpatient rehab are supervision  and min assist  with PT, supervision and min assist with OT, supervision with SLP. 8. Estimated rehab length of stay to reach the above functional goals is: 12-18 days 9. Anticipated D/C setting: Home 10. Anticipated post D/C treatments: HH therapy and Outpatient therapy 11. Overall Rehab/Functional Prognosis: excellent  RECOMMENDATIONS: This patient's condition is appropriate for continued rehabilitative care in the following setting: CIR Patient has agreed to participate in recommended program. Yes Note that insurance prior authorization may be required for reimbursement for recommended care.  Comment: Rehab Admissions Coordinator to follow up.  Thanks,  Meredith Staggers, MD, [place] Pillar, PA-C 12/14/2016

## 2016-12-14 NOTE — Progress Notes (Signed)
PCCM progress note  S:  Called to room due to cardiac arrest.  Per patients RN, patient was eating, O2 saturations dropped to the 70's, became bradycardic which progressed to asystole.  CPR initiated.  Intubated by anesthesia.  CPR started at 1836.  2 epi, 1 bicarb given.  ROSC at 1844.    O: HR ST 147, BP 147/100 ( 109), RR 23, SPO2 not reading accurately   General:  Adult thin female on MV HEENT: ETT, pupils intact, +JVD Neuro: Awake, eyes open, MAE, follows commands CV: ST 140's, weak pulses PULM: even/non-labored, bilateral rhonchi, red contents (?jello) suctioned from ETT Extremities: cool/dry, no edema   A:  Cardiac Arrest -  - occurred during eating - brady to asystole arrest during with 29mins of CPR with ROSC - now following commands P:  ICU monitoring  Place CVL- as no venous access Check CBC, BMET, mag, phos, lactic acid now    Acute respiratory failure - in the setting of cardiac arrest CAP Probable aspiration P:  Continue Unasyn Full vent support Wean FiO2/ PEEP for sats > 94% ABG in 1 hour CXR now and in am  Fentanyl prn for sedation  Family updated at bedside by Hayden Pedro, AGACNP-BC  Add CCT: 67 mins  Kennieth Rad, AGACNP-BC [place] Pgr: [phone] or if no answer [phone] 12/14/2016, 7:15 PM

## 2016-12-14 NOTE — Progress Notes (Signed)
[facility] Progress Note Patient Name: CHAITRA MAST DOB: 1956/05/15 MRN: [id number]   Date of Service  12/14/2016  HPI/Events of Note  Called into room via camera after patient went into bradycardic arrest while eating. Resuscitation initiated by nursing staff as well as CODE BLUE. 1 mg IV epinephrine was administered. Respiratory therapy attempted intubation 1 emergently without success. Anesthesia was then at bedside and intubated the patient but tube required replacement for unclear reason at this time. Endotracheal secretions/fluid was suctioned from the endotracheal tube. Patient did briefly lose a pulse again and CPR was reinitiated. Epinephrine and bicarbonate were administered.   eICU Interventions  1.  ventilator stabilization protocol ordered 2. Postintubation portable chest x-ray & ABG ordered 3. PCCM NP team at bedside to continue stabilization     Intervention Category Major Interventions: Code management / supervision  Tera Partridge 12/14/2016, 6:47 PM

## 2016-12-15 ENCOUNTER — Inpatient Hospital Stay (HOSPITAL_COMMUNITY): Payer: Medicare Other

## 2016-12-15 DIAGNOSIS — J9601 Acute respiratory failure with hypoxia: Secondary | ICD-10-CM

## 2016-12-15 HISTORY — PX: TRACHEOSTOMY: SUR1362

## 2016-12-15 LAB — GLUCOSE, CAPILLARY
GLUCOSE-CAPILLARY: 105 mg/dL — AB (ref 65–99)
GLUCOSE-CAPILLARY: 116 mg/dL — AB (ref 65–99)
GLUCOSE-CAPILLARY: 118 mg/dL — AB (ref 65–99)
GLUCOSE-CAPILLARY: 120 mg/dL — AB (ref 65–99)
GLUCOSE-CAPILLARY: 120 mg/dL — AB (ref 65–99)
Glucose-Capillary: 132 mg/dL — ABNORMAL HIGH (ref 65–99)

## 2016-12-15 LAB — CBC
HCT: 26.2 % — ABNORMAL LOW (ref 36.0–46.0)
Hemoglobin: 8.9 g/dL — ABNORMAL LOW (ref 12.0–15.0)
MCH: 30.8 pg (ref 26.0–34.0)
MCHC: 34 g/dL (ref 30.0–36.0)
MCV: 90.7 fL (ref 78.0–100.0)
PLATELETS: 123 10*3/uL — AB (ref 150–400)
RBC: 2.89 MIL/uL — ABNORMAL LOW (ref 3.87–5.11)
RDW: 14.7 % (ref 11.5–15.5)
WBC: 21.5 10*3/uL — ABNORMAL HIGH (ref 4.0–10.5)

## 2016-12-15 LAB — BASIC METABOLIC PANEL
Anion gap: 5 (ref 5–15)
BUN: 26 mg/dL — AB (ref 6–20)
CALCIUM: 7.5 mg/dL — AB (ref 8.9–10.3)
CO2: 22 mmol/L (ref 22–32)
Chloride: 114 mmol/L — ABNORMAL HIGH (ref 101–111)
Creatinine, Ser: 0.88 mg/dL (ref 0.44–1.00)
GFR calc Af Amer: >60 mL/min (ref >60)
GLUCOSE: 119 mg/dL — AB (ref 65–99)
Potassium: 3.8 mmol/L (ref 3.5–5.1)
Sodium: 141 mmol/L (ref 135–145)

## 2016-12-15 LAB — LACTIC ACID, PLASMA: Lactic Acid, Venous: 2.9 mmol/L (ref 0.5–1.9)

## 2016-12-15 LAB — PHOSPHORUS: Phosphorus: 2.2 mg/dL — ABNORMAL LOW (ref 2.5–4.6)

## 2016-12-15 LAB — MAGNESIUM: Magnesium: 2.3 mg/dL (ref 1.7–2.4)

## 2016-12-15 MED ORDER — SODIUM CHLORIDE 0.9 % IV BOLUS (SEPSIS)
1000.0000 mL | Freq: Once | INTRAVENOUS | Status: AC
  Administered 2016-12-15: 1000 mL via INTRAVENOUS

## 2016-12-15 MED ORDER — SODIUM PHOSPHATES 45 MMOLE/15ML IV SOLN
10.0000 mmol | Freq: Once | INTRAVENOUS | Status: AC
  Administered 2016-12-15: 10 mmol via INTRAVENOUS
  Filled 2016-12-15: qty 3.33

## 2016-12-15 NOTE — Care Management Note (Signed)
Case Management Note  Patient Details  Name: Melissa Oconnell MRN: [id number] Date of Birth: 09/20/1955  Subjective/Objective:   Pt admitted with Left side weakness - found to have bowel perf -  Now with wound vac and ostomy                   Action/Plan:  PTA lived with caregiver [name] [facility]) Per POA pt is semi independent with ADLs.  Pt extubated today.  PT eval ordered for pt due to additional medical complexity.    Expected Discharge Date:  12/19/16               Expected Discharge Plan:     In-House Referral:     Discharge planning Services  CM Consult  Post Acute Care Choice:    Choice offered to:     DME Arranged:    DME Agency:     HH Arranged:    HH Agency:     Status of Service:     If discussed at [employer], dates discussed:    Additional Comments:  Maryclare Labrador, RN 12/15/2016, 2:53 PM

## 2016-12-15 NOTE — Procedures (Signed)
PCCM Video Bronchoscopy Procedure Note  The patient was informed of the risks (including but not limited to bleeding, infection, respiratory failure, lung injury, tooth/oral injury) and benefits of the procedure and gave consent, see chart.  Indication: acute respiratory failure with hypoxemia, aspiration  Post Procedure Diagnosis: same  [facility], Room 2M08  Condition pre procedure: critically ill, on vent  Medications for procedure: same  Procedure description: The bronchoscope was introduced through the endotracheal tube and passed to the bilateral lungs to the level of the subsegmental bronchi throughout the tracheobronchial tree.  Airway exam revealed thick secretions left mainstem bronchus partially occluding the airway lumen.  Procedures performed: therapeutic aspiration of the left mainstem bronchus  Specimens sent: none  Condition post procedure: critically ill, on vent  EBL: none immediate  Complications: none from procedure  Roselie Awkward, MD [facility] PCCM Pager: [phone] Cell: [phone] After 3pm or if no response, call [phone]

## 2016-12-15 NOTE — Progress Notes (Signed)
[facility] Progress Note Patient Name: Melissa Oconnell DOB: 03-03-1956 MRN: [id number]   Date of Service  12/15/2016  HPI/Events of Note  Oliguria - LVEF = 50-55% and CVP = 7.  eICU Interventions  Will bolus with 0.9 NaCl 1 liter IV over 1 hour now.     Intervention Category Intermediate Interventions: Oliguria - evaluation and management  Kailly Richoux [place] 12/15/2016, 5:03 AM

## 2016-12-15 NOTE — Consult Note (Signed)
[facility] Nurse wound consult note Reason for Consult: Vac dressing changed  Wound type: Full thickness post-op wound to midline abd Wound bed: beefy red Drainage (amount, consistency, odor) scant amt pink drainage in the cannister, no odor Periwound: Intact skin surrounding Dressing procedure/placement/frequency: Pt medicated for pain prior to the procedure and tolerated with minimal discomfort. Applied one piece black foam to 125mm cont suction.   Abd wound is located very close to the stoma; WOC will perform dressing change Q M/W/F with pouch changes.  [facility] Nurse ostomy consult note Stoma type/location: Colostomy surgery was performed 5/27. Stomal assessment/size: Stoma red and viable, slightly above skin level, 1 3/4 inches.   Peristomal assessment:  Intact skin surrounding Output: Small amt brown liquid stool in the pouch Ostomy pouching:  1pc.  Education provided: Applied 1 piece pouch and barrier ring to maintain seal.  No family members present for teaching session and pt is sedated.  Will begin teaching sessions when stable and out of ICU. Enrolled patient in [place] program: No Julien Girt MSN, RN, [place], [place], [place]  Surgical team; please assess abd wound on Monday; wound is very shallow and I don't think the Vac is necessary anymore.

## 2016-12-15 NOTE — Progress Notes (Signed)
OT Cancellation Note  Patient Details Name: Melissa Oconnell MRN: [id number] DOB: [birth year]   Cancelled Treatment:    Reason Eval/Treat Not Completed: Medical issues which prohibited therapy (noted events of pm with arrest and intubation will hold and check back on 6/4 for medical stability)   Almon Register [phone] 12/15/2016, 7:30 AM

## 2016-12-15 NOTE — Progress Notes (Signed)
Inpatient Rehabilitation  Spoke with patient's POA, [name] who reported receiving the IP Rehab booklets and was in the process of looking through them.  She said that she will be in here Monday morning and would like to further discuss her rehab [place] options.  Plan for my co-worker, Gerlean Ren to follow up Monday morning.    Carmelia Roller., CCC/SLP Admission Coordinator  [place]  Cell [phone]

## 2016-12-15 NOTE — Progress Notes (Signed)
SLP Cancellation Note  Patient Details Name: MILINA PAGETT MRN: [id number] DOB: August 11, 1955   Cancelled treatment:       Reason Eval/Treat Not Completed: Medical issues which prohibited therapy (pt currently on vent, suspected aspiration, will follow; pt will need instrumental evaluation prior to po administration after extubation)   Luanna Salk, [facility] [place] SLP [phone]

## 2016-12-15 NOTE — Clinical Social Work Note (Signed)
Per RN in progression Pt was reinitiated yesterday. CSW continuing to follow for dispo plan.  [place], [place]

## 2016-12-15 NOTE — Progress Notes (Signed)
5 Days Post-Op    CC:AMS/ free intraabdominal air  Subjective: Asystole event secondary to aspiration, CPR last PM, back on Vent, sedated.  Few BS, OG in place.  Few BS, drainage from JP is cloudy this AM  Objective: Vital signs in last 24 hours: Temp:  [97 F (36.1 C)-99 F (37.2 C)] 99 F (37.2 C) (06/01 0758) Pulse Rate:  [75-110] 75 (06/01 0800) Resp:  [18-37] 18 (06/01 0800) BP: (83-103)/(58-82) 88/61 (06/01 0800) SpO2:  [88 %-100 %] 100 % (06/01 0800) FiO2 (%):  [90 %-100 %] 90 % (06/01 0433) Weight:  [61.6 kg (135 lb 12.9 oz)] 61.6 kg (135 lb 12.9 oz) (06/01 0500) Last BM Date: 12/15/16 1868 IV 310 urine recorded Drain 85 13.6 liters positive fluid balance Admit weight 49.9Kg =>> Weight today 61.6 Kg Afebrile, VSS Phos 2.2/mag 2.3 this AM and only labs CXR:  Diffuse bilateral pulmonary infiltrates/edema again noted. Left lower lobe atelectasis again noted. Small bilateral pleural effusions again noted. Chest is unchanged from prior exam . Intake/Output from previous day: 05/31 0701 - 06/01 0700 In: 1868 [I.V.:610; IV Piggyback:1258] Out: 395 [Urine:310; Drains:85] Intake/Output this shift: No intake/output data recorded.  General appearance: sedated this AM, not really responding to exam.   Resp: clear anterior exam Cardio: regular rate and rhythm, S1, S2 normal, no murmur, click, rub or gallop GI: few BS, wound vac in place, Wound care says she does not really need it.  few BS, drainage is cloudy this AM  Lab Results:   Recent Labs  12/14/16 0405 12/14/16 1852  WBC 13.3* 17.4*  HGB 8.7* 9.3*  HCT 25.8* 29.0*  PLT 114* 125*    BMET  Recent Labs  12/14/16 0405 12/14/16 1852  NA 140 140  K 3.9 3.9  CL 114* 112*  CO2 22 15*  GLUCOSE 87 297*  BUN 16 20  CREATININE 0.66 1.07*  CALCIUM 7.6* 7.5*   PT/INR No results for input(s): LABPROT, INR in the last 72 hours.   Recent Labs Lab 12/09/16 1415 12/11/16 0415  AST 36 64*  ALT 11* 33   ALKPHOS 60 50  BILITOT 0.7 0.6  PROT 6.0* 4.5*  ALBUMIN 2.9* 2.0*     Lipase     Component Value Date/Time   LIPASE 30 05/29/2007 1535     Medications: . chlorhexidine gluconate (MEDLINE KIT)  15 mL Mouth Rinse BID  . Chlorhexidine Gluconate Cloth  6 each Topical Once  . Chlorhexidine Gluconate Cloth  6 each Topical Daily  . heparin  5,000 Units Subcutaneous Q8H  . insulin aspart  2-6 Units Subcutaneous Q4H  . mouth rinse  15 mL Mouth Rinse BID  . mouth rinse  15 mL Mouth Rinse QID  . sodium chloride flush  10-40 mL Intracatheter Q12H    Assessment/Plan Perforated sigmoid colon from sigmoid diverticulitis with fecal peritonitis and [place] 4 Exploratory laparotomy with sigmoid colectomy and formation of colostomy with lysis of adhesions, 12/10/16, Dr. Marcello Moores [place] POD 5 Shock secondary to sepsis Acute renal failure Leukopenia - severe sepsis Acute metabolic encephalopathy  Multiple sclerosis Some history of dementia FEN: IV fluids/NPO ID: Zosyn/Vancomycin 5/26-5/29 >> Unasyn 5/29 =>> day 4 TIR:[id number]  Plan:  Discussed with CCM, will get labs and follow.    LOS: 6 days    Tanith Dagostino 12/15/2016 [phone]

## 2016-12-15 NOTE — Progress Notes (Signed)
PT Cancellation Note  Patient Details Name: Melissa Oconnell MRN: [id number] DOB: 03-17-1956   Cancelled Treatment:    Reason Eval/Treat Not Completed: Medical issues which prohibited therapy (noted events of pm with arrest and intubation will hold and check back on 6/4 for medical stability)   Melissa Oconnell B Melissa Oconnell 12/15/2016, 7:29 AM  Elwyn Reach, Scissors

## 2016-12-15 NOTE — Progress Notes (Signed)
PULMONARY / CRITICAL CARE MEDICINE   Name: Melissa Oconnell MRN: [id number] DOB: Dec 09, 1955    ADMISSION DATE:  12/09/2016 CONSULTATION DATE:  12/09/2016   REFERRING MD:  Jeanell Sparrow  CHIEF COMPLAINT:  confusion  HISTORY OF PRESENT ILLNESS:   61 y/o with MS, had perforated sigmoid colon, to OR, VDRF. Septic shock.  SUBJECTIVE:  Coded while eating, 10 minutes On vent CVL placed  VITAL SIGNS: BP (!) 87/60   Pulse 78   Temp 99 F (37.2 C) (Oral)   Resp 18   Ht 5\' 8"  (1.727 m)   Wt 61.6 kg (135 lb 12.9 oz)   SpO2 100%   BMI 20.65 kg/m   HEMODYNAMICS: CVP:  [7 mmHg] 7 mmHg  VENTILATOR SETTINGS: Vent Mode: PRVC FiO2 (%):  [90 %-100 %] 90 % Set Rate:  [14 bmp-18 bmp] 18 bmp Vt Set:  [500 mL] 500 mL PEEP:  [5 cmH20] 5 cmH20 Plateau Pressure:  [15 cmH20-21 cmH20] 15 cmH20  INTAKE / OUTPUT: I/O last 3 completed shifts: In: 2673.6 [I.V.:1215.6; IV Piggyback:1458] Out: 685 [Urine:435; Drains:100; Stool:150]  PHYSICAL EXAMINATION:  General:  Resting comfortably in bed HENT: NCAT ETT in place PULM: Rhonchi B, diminished left base, normal effort CV: RRR, no mgr GI: BS+, soft, nontender MSK: normal bulk and tone Neuro: awake, follows commands, can raise R hand off bed, nods head to questions appropriately    LABS:  BMET  Recent Labs Lab 12/13/16 2108 12/14/16 0405 12/14/16 1852  NA 139 140 140  K 3.9 3.9 3.9  CL 114* 114* 112*  CO2 22 22 15*  BUN 13 16 20   CREATININE 0.64 0.66 1.07*  GLUCOSE 89 87 297*    Electrolytes  Recent Labs Lab 12/12/16 0310  12/13/16 2108 12/14/16 0405 12/14/16 1852 12/15/16 0340  CALCIUM 6.9*  < > 7.4* 7.6* 7.5*  --   MG 2.3  < >  --  2.0 2.3 2.3  PHOS 2.6  --   --   --  5.9* 2.2*  < > = values in this interval not displayed.  CBC  Recent Labs Lab 12/13/16 1550 12/14/16 0405 12/14/16 1852  WBC 13.0* 13.3* 17.4*  HGB 9.0* 8.7* 9.3*  HCT 26.5* 25.8* 29.0*  PLT 102* 114* 125*    Coag's  Recent Labs Lab  12/09/16 1415  APTT 26  INR 1.24    Sepsis Markers  Recent Labs Lab 12/09/16 1901  12/10/16 0005 12/14/16 1917 12/14/16 2330  LATICACIDVEN  --   < > 5.3* 8.5* 2.9*  PROCALCITON >150.00  --   --   --   --   < > = values in this interval not displayed.  ABG  Recent Labs Lab 12/10/16 1400 12/11/16 0310 12/14/16 2048  PHART 7.302* 7.415 7.322*  PCO2ART 37.8 25.8* 29.7*  PO2ART 95.0 107 91.0    Liver Enzymes  Recent Labs Lab 12/09/16 1415 12/11/16 0415  AST 36 64*  ALT 11* 33  ALKPHOS 60 50  BILITOT 0.7 0.6  ALBUMIN 2.9* 2.0*    Cardiac Enzymes No results for input(s): TROPONINI, PROBNP in the last 168 hours.  Glucose  Recent Labs Lab 12/14/16 1134 12/14/16 1618 12/14/16 2018 12/14/16 2339 12/15/16 0334 12/15/16 0755  GLUCAP 108* 166* 272* 175* 118* 105*    Imaging Dg Chest Port 1 View  Result Date: 12/15/2016 CLINICAL DATA:  Cardiac arrest. EXAM: PORTABLE CHEST 1 VIEW COMPARISON:  12/14/2016. FINDINGS: Endotracheal tube, NG tube, right IJ line stable position. Heart size stable.  Diffuse bilateral pulmonary infiltrates/edema again noted. No pleural effusion or pneumothorax. IMPRESSION: 1.  Lines and tubes in stable position. 2. Diffuse bilateral pulmonary infiltrates/edema again noted. Left lower lobe atelectasis again noted. Small bilateral pleural effusions again noted. Chest is unchanged from prior exam . Electronically Signed   By: Marcello Moores  Register   On: 12/15/2016 06:51   Portable Chest Xray  Result Date: 12/14/2016 CLINICAL DATA:  Acute respiratory failure with hypoxia. Ventilator dependent. EXAM: PORTABLE CHEST 1 VIEW COMPARISON:  12/12/2016, 12/11/2016 and earlier. FINDINGS: Endotracheal tube tip remains in satisfactory position approximately 6 cm above the carina. Right jugular central venous catheter tip projects over the mid SVC. Nasogastric tube courses below the diaphragm into the stomach but its tip is not included on the image. External  pacing pads overlie the chest. Left subclavian central venous catheter has been removed since the examination 2 days ago. Cardiac silhouette upper normal in size to slightly enlarged. Interval development of perihilar airspace pulmonary edema. Dense consolidation in the left lower lobe, not significantly changed. Bilateral pleural effusions are suspected. IMPRESSION: 1.  Support apparatus satisfactory. 2. Acute perihilar airspace pulmonary edema, new since the examination 2 days ago. 3. Stable dense left lower lobe atelectasis and/or pneumonia. Small bilateral pleural effusions are suspected. Electronically Signed   By: Evangeline Dakin M.D.   On: 12/14/2016 19:56   Dg Abd Portable 1v  Result Date: 12/14/2016 CLINICAL DATA:  OG tube placement EXAM: PORTABLE ABDOMEN - 1 VIEW COMPARISON:  None. FINDINGS: AP portable supine view the abdomen at 1952 hours. Only the left upper quadrant has been included on the film. OG tube tip overlies the proximal stomach. Proximal port of the OG tube is at or just distal to the EG junction. Defibrillator pads overlie the lower left hemithorax. Mild gaseous bowel distention noted in the left abdomen. IMPRESSION: OG tube tip is in the proximal stomach. Electronically Signed   By: Misty Stanley M.D.   On: 12/14/2016 20:06     STUDIES:  5/26 ct head/neck angiogram> no intracranial stenosis 5/27 CT abdomen> free air, sigmoid colon swelling  CULTURES: 5/27 resp > klebsiella, GPC 5/26 blood > neg   ANTIBIOTICS: 5/26 vanc > 5/29 5/26 zosyn > 5/29 5/29 unasyn >   SIGNIFICANT EVENTS: 5/27 OR exlap, colostomy 5/31 Cardiac arrest post extubation  LINES/TUBES: 5/26 ETT > 5/30 5/26 left subclavian CVL>   DISCUSSION: 61 y/o female with MS baseline who had a perforated sigmoid colon leading to sepsis, encephalopathy, respiratory failure.  Aspiration event with cardiac arrest on 5/31, appears neurologically intact (despite baseline weakness).    ASSESSMENT /  PLAN:  PULMONARY A: Acute respiratory failure with hypoxemia  Aspiration 5/31 P:   Full vent support Bronchoscopy today for airway clearance/secretions  CARDIOVASCULAR A:  Septic shock > resolved P:  Tele Monitor hemodynamics  RENAL A:   Hypokalemia > resolved Hypophosphatemia AKI P:   Monitor BMET and UOP Replace electrolytes as needed Replace phos Bolus 500cc crystalloid   GASTROINTESTINAL A:   Sigmoid colon perforation S/p ostomy P:   Wound care per surgery   HEMATOLOGIC A:   Anemia > no clear sign of bleeding, suspect dilution P:  Monitor CBC q shift Transfuse if Hgb < 7gm/dL  INFECTIOUS A:   Septic shock due to peritonitis > improved Klebsiella CAP P:   Plan to run unasyn for 7 day course of antibiotics total  ENDOCRINE A:   No acute issues P:   KVO D5  NEUROLOGIC A:  MS baseline > worse, has acute flare up P:   Pulse dose steroids per neurology   FAMILY  - Updates: updated [name] (HCPOA) 6/1  My cc time 34 minutes  Roselie Awkward, MD [facility] Pager: [phone] Cell: [phone] After 3pm or if no response, call [phone]    12/15/2016, 9:59 AM

## 2016-12-16 ENCOUNTER — Encounter (HOSPITAL_COMMUNITY): Payer: Self-pay

## 2016-12-16 ENCOUNTER — Inpatient Hospital Stay (HOSPITAL_COMMUNITY): Payer: Medicare Other

## 2016-12-16 DIAGNOSIS — I472 Ventricular tachycardia: Secondary | ICD-10-CM

## 2016-12-16 DIAGNOSIS — I313 Pericardial effusion (noninflammatory): Secondary | ICD-10-CM

## 2016-12-16 DIAGNOSIS — R579 Shock, unspecified: Secondary | ICD-10-CM

## 2016-12-16 LAB — GLUCOSE, CAPILLARY
GLUCOSE-CAPILLARY: 129 mg/dL — AB (ref 65–99)
GLUCOSE-CAPILLARY: 163 mg/dL — AB (ref 65–99)
GLUCOSE-CAPILLARY: 145 mg/dL — AB (ref 65–99)
Glucose-Capillary: 111 mg/dL — ABNORMAL HIGH (ref 65–99)
Glucose-Capillary: 105 mg/dL — ABNORMAL HIGH (ref 65–99)

## 2016-12-16 LAB — PROTIME-INR
INR: 1.45
PROTHROMBIN TIME: 17.8 s — AB (ref 11.4–15.2)

## 2016-12-16 LAB — CBC WITH DIFFERENTIAL/PLATELET
Basophils Absolute: 0 10*3/uL (ref 0.0–0.1)
Basophils Relative: 0 %
EOS ABS: 0 10*3/uL (ref 0.0–0.7)
EOS PCT: 0 %
HEMATOCRIT: 24.1 % — AB (ref 36.0–46.0)
Hemoglobin: 8.2 g/dL — ABNORMAL LOW (ref 12.0–15.0)
LYMPHS ABS: 2.9 10*3/uL (ref 0.7–4.0)
Lymphocytes Relative: 11 %
MCH: 30.9 pg (ref 26.0–34.0)
MCHC: 34 g/dL (ref 30.0–36.0)
MCV: 90.9 fL (ref 78.0–100.0)
MONO ABS: 1.6 10*3/uL — AB (ref 0.1–1.0)
Monocytes Relative: 6 %
NEUTROS PCT: 83 %
Neutro Abs: 22.2 10*3/uL — ABNORMAL HIGH (ref 1.7–7.7)
PLATELETS: 132 10*3/uL — AB (ref 150–400)
RBC: 2.65 MIL/uL — AB (ref 3.87–5.11)
RDW: 14.4 % (ref 11.5–15.5)
WBC: 26.7 10*3/uL — AB (ref 4.0–10.5)

## 2016-12-16 LAB — COMPREHENSIVE METABOLIC PANEL
ALBUMIN: 1.6 g/dL — AB (ref 3.5–5.0)
ALT: 33 U/L (ref 14–54)
ANION GAP: 11 (ref 5–15)
AST: 32 U/L (ref 15–41)
Alkaline Phosphatase: 152 U/L — ABNORMAL HIGH (ref 38–126)
BILIRUBIN TOTAL: 0.3 mg/dL (ref 0.3–1.2)
BUN: 33 mg/dL — ABNORMAL HIGH (ref 6–20)
CHLORIDE: 110 mmol/L (ref 101–111)
CO2: 20 mmol/L — ABNORMAL LOW (ref 22–32)
Calcium: 7.8 mg/dL — ABNORMAL LOW (ref 8.9–10.3)
Creatinine, Ser: 1.03 mg/dL — ABNORMAL HIGH (ref 0.44–1.00)
GFR calc Af Amer: >60 mL/min (ref >60)
GFR calc non Af Amer: 58 mL/min — ABNORMAL LOW (ref >60)
GLUCOSE: 122 mg/dL — AB (ref 65–99)
POTASSIUM: 4.1 mmol/L (ref 3.5–5.1)
Sodium: 141 mmol/L (ref 135–145)
TOTAL PROTEIN: 4.8 g/dL — AB (ref 6.5–8.1)

## 2016-12-16 LAB — PHOSPHORUS
PHOSPHORUS: 3.3 mg/dL (ref 2.5–4.6)
Phosphorus: 4.3 mg/dL (ref 2.5–4.6)
Phosphorus: 3.3 mg/dL (ref 2.5–4.6)

## 2016-12-16 LAB — POCT I-STAT 3, ART BLOOD GAS (G3+)
ACID-BASE DEFICIT: 3 mmol/L — AB (ref 0.0–2.0)
Bicarbonate: 19.9 mmol/L — ABNORMAL LOW (ref 20.0–28.0)
O2 Saturation: 83 %
PH ART: 7.453 — AB (ref 7.350–7.450)
PO2 ART: 45 mmHg — AB (ref 83.0–108.0)
TCO2: 21 mmol/L (ref 0–100)
pCO2 arterial: 28.5 mmHg — ABNORMAL LOW (ref 32.0–48.0)

## 2016-12-16 LAB — MAGNESIUM
MAGNESIUM: 2.3 mg/dL (ref 1.7–2.4)
MAGNESIUM: 2.2 mg/dL (ref 1.7–2.4)
Magnesium: 2.2 mg/dL (ref 1.7–2.4)

## 2016-12-16 LAB — HEPARIN LEVEL (UNFRACTIONATED): HEPARIN UNFRACTIONATED: 0.48 [IU]/mL (ref 0.30–0.70)

## 2016-12-16 LAB — APTT: APTT: 35 s (ref 24–36)

## 2016-12-16 MED ORDER — PIPERACILLIN-TAZOBACTAM 3.375 G IVPB 30 MIN
3.3750 g | Freq: Once | INTRAVENOUS | Status: AC
  Administered 2016-12-16: 3.375 g via INTRAVENOUS
  Filled 2016-12-16: qty 50

## 2016-12-16 MED ORDER — PIPERACILLIN-TAZOBACTAM 3.375 G IVPB
3.3750 g | Freq: Three times a day (TID) | INTRAVENOUS | Status: AC
  Administered 2016-12-16 – 2016-12-22 (×19): 3.375 g via INTRAVENOUS
  Filled 2016-12-16 (×19): qty 50

## 2016-12-16 MED ORDER — VANCOMYCIN HCL 500 MG IV SOLR
500.0000 mg | Freq: Two times a day (BID) | INTRAVENOUS | Status: DC
  Administered 2016-12-17 – 2016-12-18 (×3): 500 mg via INTRAVENOUS
  Filled 2016-12-16 (×4): qty 500

## 2016-12-16 MED ORDER — VITAL HIGH PROTEIN PO LIQD
1000.0000 mL | ORAL | Status: DC
  Administered 2016-12-16: 1000 mL
  Filled 2016-12-16: qty 1000

## 2016-12-16 MED ORDER — VITAL AF 1.2 CAL PO LIQD
1000.0000 mL | ORAL | Status: DC
  Administered 2016-12-16 – 2016-12-26 (×10): 1000 mL

## 2016-12-16 MED ORDER — PRO-STAT SUGAR FREE PO LIQD
30.0000 mL | Freq: Two times a day (BID) | ORAL | Status: DC
  Administered 2016-12-16: 30 mL
  Filled 2016-12-16: qty 30

## 2016-12-16 MED ORDER — HEPARIN (PORCINE) IN NACL 100-0.45 UNIT/ML-% IJ SOLN
1100.0000 [IU]/h | INTRAMUSCULAR | Status: DC
  Administered 2016-12-16 – 2016-12-18 (×3): 1000 [IU]/h via INTRAVENOUS
  Administered 2016-12-21: 1100 [IU]/h via INTRAVENOUS
  Filled 2016-12-16 (×7): qty 250

## 2016-12-16 MED ORDER — DEXTROSE IN LACTATED RINGERS 5 % IV SOLN
INTRAVENOUS | Status: DC
  Administered 2016-12-16 – 2016-12-18 (×4): via INTRAVENOUS

## 2016-12-16 MED ORDER — IOPAMIDOL (ISOVUE-370) INJECTION 76%
INTRAVENOUS | Status: AC
  Administered 2016-12-16: 100 mL
  Filled 2016-12-16: qty 100

## 2016-12-16 MED ORDER — PANTOPRAZOLE SODIUM 40 MG IV SOLR
40.0000 mg | INTRAVENOUS | Status: DC
  Administered 2016-12-16 – 2016-12-21 (×6): 40 mg via INTRAVENOUS
  Filled 2016-12-16 (×6): qty 40

## 2016-12-16 MED ORDER — SODIUM CHLORIDE 0.9 % IV SOLN
1250.0000 mg | Freq: Once | INTRAVENOUS | Status: AC
  Administered 2016-12-16: 1250 mg via INTRAVENOUS
  Filled 2016-12-16: qty 1250

## 2016-12-16 MED FILL — Medication: Qty: 1 | Status: AC

## 2016-12-16 NOTE — Progress Notes (Signed)
RT note-Patient was transported to CT and now back, no changes at this time.

## 2016-12-16 NOTE — Progress Notes (Signed)
Pharmacy Antibiotic Note  Melissa Oconnell is a 61 y.o. female with possible PNA on unasyn. Pharmacy has been consulted  to change to vancomycin/zosyn dosing. -WBC= 26.7 (up), tmax= 100.1, SCr= 1.03, CrCl ~ 55  Plan: -Zosyn 3.375gm IV q8h -Will follow renal function, cultures and clinical progress -Vancomycin 1250mg  IV x1 followed by 500mg  IV q12h   Height: 5\' 8"  (172.7 cm) Weight: 135 lb 12.9 oz (61.6 kg) IBW/kg (Calculated) : 63.9  Temp (24hrs), Avg:98.7 F (37.1 C), Min:97.6 F (36.4 C), Max:100.1 F (37.8 C)   Recent Labs Lab 12/09/16 1735  12/09/16 2152 12/10/16 0005  12/13/16 1550 12/13/16 2108 12/14/16 0405 12/14/16 1852 12/14/16 1917 12/14/16 2330 12/15/16 0900 12/16/16 0630  WBC  --   < >  --   --   < > 13.0*  --  13.3* 17.4*  --   --  21.5* 26.7*  CREATININE  --   < >  --   --   < > 0.68 0.64 0.66 1.07*  --   --  0.88 1.03*  LATICACIDVEN 4.97*  --  5.8* 5.3*  --   --   --   --   --  8.5* 2.9*  --   --   < > = values in this interval not displayed.  Estimated Creatinine Clearance: 56.5 mL/min (A) (by C-G formula based on SCr of 1.03 mg/dL (H)).    No Known Allergies  Antimicrobials this admission: Vanc 5/26>>5/29 Zosyn 5/26>>5/29 Unasyn 5/29 >>  6/2  Dose adjustments this admission:   Microbiology results: 5/26 MRSA - NEG 5/26 Urine - NEG 5/26 Blood - NGTD 5/27 TA - rare Kleb pneumoniae; few C. albicans - R to Amp, otherwise pan sensitive  Thank you for allowing pharmacy to be a part of this patient's care.  Hildred Laser, Pharm D 12/16/2016 2:27 PM

## 2016-12-16 NOTE — Progress Notes (Signed)
6 Days Post-Op   Subjective/Chief Complaint: Awake on the vent Reports no abdominal pain   Objective: Vital signs in last 24 hours: Temp:  [97.6 F (36.4 C)-100.1 F (37.8 C)] 98.4 F (36.9 C) (06/02 0820) Pulse Rate:  [8-85] 73 (06/02 0845) Resp:  [16-22] 18 (06/02 0845) BP: (84-108)/(49-74) 99/70 (06/02 0845) SpO2:  [99 %-100 %] 100 % (06/02 0845) FiO2 (%):  [40 %-60 %] 60 % (06/02 0717) Weight:  [61.6 kg (135 lb 12.9 oz)] 61.6 kg (135 lb 12.9 oz) (06/02 0500) Last BM Date: 12/15/16  Intake/Output from previous day: 06/01 0701 - 06/02 0700 In: 1002 [I.V.:544; IV Piggyback:458] Out: 685 [Urine:635; Drains:20; Stool:30] Intake/Output this shift: Total I/O In: 110 [I.V.:10; IV Piggyback:100] Out: 35 [Urine:35]  Exam: On vent Following commands Abdomen soft, VAC in place Ostomy pink, nothing in the bag Drain seropurulent  Lab Results:   Recent Labs  12/15/16 0900 12/16/16 0630  WBC 21.5* 26.7*  HGB 8.9* 8.2*  HCT 26.2* 24.1*  PLT 123* 132*   BMET  Recent Labs  12/15/16 0900 12/16/16 0630  NA 141 141  K 3.8 4.1  CL 114* 110  CO2 22 20*  GLUCOSE 119* 122*  BUN 26* 33*  CREATININE 0.88 1.03*  CALCIUM 7.5* 7.8*   PT/INR No results for input(s): LABPROT, INR in the last 72 hours. ABG  Recent Labs  12/14/16 2048 12/16/16 0649  PHART 7.322* 7.453*  HCO3 15.5* 19.9*    Studies/Results: Dg Chest Port 1 View  Result Date: 12/15/2016 CLINICAL DATA:  Cardiac arrest. EXAM: PORTABLE CHEST 1 VIEW COMPARISON:  12/14/2016. FINDINGS: Endotracheal tube, NG tube, right IJ line stable position. Heart size stable. Diffuse bilateral pulmonary infiltrates/edema again noted. No pleural effusion or pneumothorax. IMPRESSION: 1.  Lines and tubes in stable position. 2. Diffuse bilateral pulmonary infiltrates/edema again noted. Left lower lobe atelectasis again noted. Small bilateral pleural effusions again noted. Chest is unchanged from prior exam . Electronically Signed    By: Marcello Moores  Register   On: 12/15/2016 06:51   Portable Chest Xray  Result Date: 12/14/2016 CLINICAL DATA:  Acute respiratory failure with hypoxia. Ventilator dependent. EXAM: PORTABLE CHEST 1 VIEW COMPARISON:  12/12/2016, 12/11/2016 and earlier. FINDINGS: Endotracheal tube tip remains in satisfactory position approximately 6 cm above the carina. Right jugular central venous catheter tip projects over the mid SVC. Nasogastric tube courses below the diaphragm into the stomach but its tip is not included on the image. External pacing pads overlie the chest. Left subclavian central venous catheter has been removed since the examination 2 days ago. Cardiac silhouette upper normal in size to slightly enlarged. Interval development of perihilar airspace pulmonary edema. Dense consolidation in the left lower lobe, not significantly changed. Bilateral pleural effusions are suspected. IMPRESSION: 1.  Support apparatus satisfactory. 2. Acute perihilar airspace pulmonary edema, new since the examination 2 days ago. 3. Stable dense left lower lobe atelectasis and/or pneumonia. Small bilateral pleural effusions are suspected. Electronically Signed   By: Evangeline Dakin M.D.   On: 12/14/2016 19:56   Dg Abd Portable 1v  Result Date: 12/14/2016 CLINICAL DATA:  OG tube placement EXAM: PORTABLE ABDOMEN - 1 VIEW COMPARISON:  None. FINDINGS: AP portable supine view the abdomen at 1952 hours. Only the left upper quadrant has been included on the film. OG tube tip overlies the proximal stomach. Proximal port of the OG tube is at or just distal to the EG junction. Defibrillator pads overlie the lower left hemithorax. Mild gaseous bowel distention  noted in the left abdomen. IMPRESSION: OG tube tip is in the proximal stomach. Electronically Signed   By: Misty Stanley M.D.   On: 12/14/2016 20:06    Anti-infectives: Anti-infectives    Start     Dose/Rate Route Frequency Ordered Stop   12/12/16 1430  Ampicillin-Sulbactam  (UNASYN) 3 g in sodium chloride 0.9 % 100 mL IVPB     3 g 200 mL/hr over 30 Minutes Intravenous Every 6 hours 12/12/16 1402 12/17/16 1429   12/11/16 2230  vancomycin (VANCOCIN) 500 mg in sodium chloride 0.9 % 100 mL IVPB  Status:  Discontinued     500 mg 100 mL/hr over 60 Minutes Intravenous Every 12 hours 12/11/16 1118 12/12/16 0956   12/11/16 1030  vancomycin (VANCOCIN) 500 mg in sodium chloride 0.9 % 100 mL IVPB  Status:  Discontinued     500 mg 100 mL/hr over 60 Minutes Intravenous Every 12 hours 12/11/16 0944 12/11/16 1115   12/10/16 1700  vancomycin (VANCOCIN) IVPB 750 mg/150 ml premix  Status:  Discontinued     750 mg 150 mL/hr over 60 Minutes Intravenous Every 12 hours 12/09/16 1532 12/10/16 1025   12/10/16 1700  vancomycin (VANCOCIN) IVPB 750 mg/150 ml premix  Status:  Discontinued     750 mg 150 mL/hr over 60 Minutes Intravenous Every 24 hours 12/10/16 1025 12/11/16 0944   12/10/16 0000  piperacillin-tazobactam (ZOSYN) IVPB 3.375 g  Status:  Discontinued     3.375 g 12.5 mL/hr over 240 Minutes Intravenous Every 8 hours 12/09/16 1532 12/12/16 1402   12/09/16 1530  piperacillin-tazobactam (ZOSYN) IVPB 3.375 g     3.375 g 100 mL/hr over 30 Minutes Intravenous  Once 12/09/16 1520 12/09/16 1659   12/09/16 1530  vancomycin (VANCOCIN) IVPB 1000 mg/200 mL premix     1,000 mg 200 mL/hr over 60 Minutes Intravenous  Once 12/09/16 1520 12/09/16 1801      Assessment/Plan: s/p Procedure(s): 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  She is now 6 days post op.  Suspect it's from the aspiration, but may need to get a CT scan of the abdomen in the next 1 or 2 days to r/o intra-abdominal abscesses. Ok to start tube feeds from surgery standpoint Continue VAC  LOS: 7 days    Izack Hoogland A 12/16/2016

## 2016-12-16 NOTE — Progress Notes (Signed)
Nutrition Follow-up  DOCUMENTATION CODES:  Underweight  INTERVENTION:  Change TF to Vital AF 1.2 with goal rate of 50 ml/h (1200 ml per day) to provide1440 kcals, 90 gm protein, 973 ml free water daily.  NUTRITION DIAGNOSIS:  Inadequate oral intake related to inability to eat as evidenced by NPO status.  GOAL:  Patient will meet greater than or equal to 90% of their needs  MONITOR:  Diet advancement, Labs, Vent status, TF tolerance, Skin  REASON FOR ASSESSMENT:  Consult Enteral/tube feeding initiation and management  ASSESSMENT:  61 yo female presented with Lt side weakness, dysarthria.  She has hx of MS.  Initial concern for code stroke.  Noted to have hypotension from sepsis.  Had abdominal pain and CT abd/pelvis concerning for free air. She was taken to the OR for ex-lap and found to have a sigmoid perforation. 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  Pt coded again this morning. Will likely require trach  Pt has essentially had no nutrition since admit. She was briefly on clear liquids 5/31. Apparently aspirated, became bradycardic and coded. Had to be re-intubated.   Per WOC note, pt's wound shallow, likely doesn't need VAC. RN states it is not draining. Ostomy functioning  Patient is currently intubated on ventilator support MV: 9.7L/min Temp (24hrs), Avg:98.6 F (37 C), Min:97.6 F (36.4 C), Max:100.1 F (37.8 C) Propofol: None  Pt awake and alert on bed. Vital HP infusing at 40 ml/hr. When asks if she has any nausea/abdominal discomfort she shakes her head 'no'.   Her dosing weight appears to be 117.4 lbs (53.36 kg)  Labs BG 110-130, Phos/K wdl. Albumin: 1.6, WBC:26.7 Meds: Insulin, IV abx, IVF, Methylprednislone,    Recent Labs Lab 12/14/16 1852 12/15/16 0340 12/15/16 0900 12/16/16 0630 12/16/16 1150  NA 140  --  141 141  --   K 3.9  --  3.8 4.1  --   CL 112*  --  114* 110  --   CO2 15*  --  22 20*  --   BUN 20  --  26* 33*  --   CREATININE 1.07*   --  0.88 1.03*  --   CALCIUM 7.5*  --  7.5* 7.8*  --   MG 2.3 2.3  --  2.3 2.2  PHOS 5.9* 2.2*  --  4.3 3.3  GLUCOSE 297*  --  119* 122*  --    Diet Order:  Diet NPO time specified  Skin: Abdominal incision w/ VAC  Last BM:  6/1-ostomy  Height:  Ht Readings from Last 1 Encounters:  12/09/16 5\' 8"  (1.727 m)   Weight:  Wt Readings from Last 1 Encounters:  12/16/16 135 lb 12.9 oz (61.6 kg)   Wt Readings from Last 10 Encounters:  12/16/16 135 lb 12.9 oz (61.6 kg)  11/16/16 118 lb (53.5 kg)  04/05/16 124 lb 8 oz (56.5 kg)  03/02/16 127 lb (57.6 kg)  10/04/15 127 lb (57.6 kg)  09/02/15 128 lb 3.2 oz (58.2 kg)  04/05/15 125 lb 3.2 oz (56.8 kg)  02/25/15 112 lb 12.8 oz (51.2 kg)  01/25/15 119 lb (54 kg)  12/30/14 125 lb 9.6 oz (57 kg)   Ideal Body Weight:  63.6 kg  BMI:  Body mass index using dosing weight is 17.9 kg/m.  Estimated Nutritional Needs:  Kcal:  1510 kcals (psu 2003 b) Protein:  80-95g (1.5-1.8g/kg bw) Fluid:  Per MD  EDUCATION NEEDS:  No education needs identified at this time  Ovid Curd  Cecille Rubin RD, LDN, CNSC Clinical Nutrition Pager: [phone] 12/16/2016 3:48 PM

## 2016-12-16 NOTE — Progress Notes (Signed)
PULMONARY / CRITICAL CARE MEDICINE   Name: JARYN HOCUTT MRN: [id number] DOB: [birth year]    ADMISSION DATE:  12/09/2016 CONSULTATION DATE:  12/09/2016   REFERRING MD:  Jeanell Sparrow  CHIEF COMPLAINT:  confusion  HISTORY OF PRESENT ILLNESS:   61 y/o with MS, had perforated sigmoid colon, to OR, VDRF. Septic shock.  SUBJECTIVE:  Weaning oxygen   VITAL SIGNS: BP 99/70   Pulse 73   Temp 98.4 F (36.9 C) (Oral)   Resp 18   Ht '5\' 8"'$  (1.727 m)   Wt 135 lb 12.9 oz (61.6 kg)   SpO2 100%   BMI 20.65 kg/m   HEMODYNAMICS:    VENTILATOR SETTINGS: Vent Mode: PRVC FiO2 (%):  [40 %-60 %] 40 % Set Rate:  [18 bmp] 18 bmp Vt Set:  [500 mL] 500 mL PEEP:  [5 cmH20] 5 cmH20 Plateau Pressure:  [16 cmH20-21 cmH20] 17 cmH20  INTAKE / OUTPUT:  Intake/Output Summary (Last 24 hours) at 12/16/16 1020 Last data filed at 12/16/16 0811  Gross per 24 hour  Intake              844 ml  Output              595 ml  Net              249 ml   PHYSICAL EXAMINATION: General appearance:  61 Year old  female, conversant on vent  Eyes: anicteric sclerae, moist conjunctivae; PERRL, EOMI bilaterally. Mouth:  membranes and no mucosal ulcerations; normal hard and soft palate, orally intubated. Some blood tinged gastric output Neck: Trachea midline; neck supple, no JVD Lungs/chest: decreased both bases, with normal respiratory effort and no intercostal retractions CV: RRR, no MRGs  Abdomen: Soft, non-tender; no masses or HSM, soft. Not tender. Stoma is pink, wound vac looks good Extremities: No peripheral edema or extremity lymphadenopathy Skin: Normal temperature, turgor and texture; no rash, ulcers or subcutaneous nodules Neuro/ Psych: Appropriate affect, alert and oriented to person, place and time LABS:  BMET  Recent Labs Lab 12/14/16 1852 12/15/16 0900 12/16/16 0630  NA 140 141 141  K 3.9 3.8 4.1  CL 112* 114* 110  CO2 15* 22 20*  BUN 20 26* 33*  CREATININE 1.07* 0.88 1.03*  GLUCOSE 297* 119*  122*    Electrolytes  Recent Labs Lab 12/14/16 1852 12/15/16 0340 12/15/16 0900 12/16/16 0630  CALCIUM 7.5*  --  7.5* 7.8*  MG 2.3 2.3  --  2.3  PHOS 5.9* 2.2*  --  4.3   CBC  Recent Labs Lab 12/14/16 1852 12/15/16 0900 12/16/16 0630  WBC 17.4* 21.5* 26.7*  HGB 9.3* 8.9* 8.2*  HCT 29.0* 26.2* 24.1*  PLT 125* 123* 132*    Coag's  Recent Labs Lab 12/09/16 1415  APTT 26  INR 1.24    Sepsis Markers  Recent Labs Lab 12/09/16 1901  12/10/16 0005 12/14/16 1917 12/14/16 2330  LATICACIDVEN  --   < > 5.3* 8.5* 2.9*  PROCALCITON >150.00  --   --   --   --   < > = values in this interval not displayed.  ABG  Recent Labs Lab 12/11/16 0310 12/14/16 2048 12/16/16 0649  PHART 7.415 7.322* 7.453*  PCO2ART 25.8* 29.7* 28.5*  PO2ART 107 91.0 45.0*    Liver Enzymes  Recent Labs Lab 12/09/16 1415 12/11/16 0415 12/16/16 0630  AST 36 64* 32  ALT 11* 33 33  ALKPHOS 60 50 152*  BILITOT 0.7 0.6 0.3  ALBUMIN 2.9* 2.0* 1.6*    Cardiac Enzymes No results for input(s): TROPONINI, PROBNP in the last 168 hours.  Glucose  Recent Labs Lab 12/15/16 1136 12/15/16 1526 12/15/16 2028 12/15/16 2336 12/16/16 0345 12/16/16 0823  GLUCAP 132* 120* 120* 116* 111* 129*    Imaging No results found.   STUDIES:  5/26 ct head/neck angiogram> no intracranial stenosis 5/27 CT abdomen> free air, sigmoid colon swelling 5/28 echo: Left ventricle:  The cavity size was normal. Wall thickness was normal. Systolic function was normal. The estimated ejection fraction was in the range of 50% to 55%. Features are consistent with a pseudonormal left ventricular filling pattern, with concomitant abnormal relaxation and increased filling pressure (grade 2 diastolic dysfunction).Right ventricle:  The cavity size was normal. Systolic function was mildly to moderately reduced.  CULTURES: 5/27 resp > klebsiella, GPC 5/26 blood > neg   ANTIBIOTICS: 5/26 vanc > 5/29 5/26 zosyn >  5/29 5/29 unasyn > 6/2  SIGNIFICANT EVENTS: 5/27 OR exlap, colostomy 5/31 Cardiac arrest post extubation 6/2 arrested in am. PEA. Then NSVT. Cards called.   LINES/TUBES: 5/26 ETT > 5/30 5/26 left subclavian CVL>   DISCUSSION: 61 y/o female with MS baseline who had a perforated sigmoid colon leading to sepsis, encephalopathy, respiratory failure.  Aspiration event with cardiac arrest on 5/31, appears neurologically intact (despite baseline weakness).    ASSESSMENT / PLAN:  PULMONARY A: Acute respiratory failure with hypoxemia  Aspiration 5/31 Mild resp alk. (she is awake and breathing over set vent rate) P:   Full vent support CT chest today  Ideally would like to diurese but getting CT chest and want to keep renal fxn from worsening  CARDIOVASCULAR A:  Septic shock > resolved Cardiac arrest.  -had second cardiac arrest this am. Was initially PEA and then has brief NSVT  P:  Cont tele Getting CT to be sure no PE Further recs per cards  RENAL A:   AKI P:   Cont IVFs; increasing w/ plan for CT angio F/u am chemistry   GASTROINTESTINAL A:   Sigmoid colon perforation S/p ostomy P:   Wound care per surgery  Start tubefeeds  HEMATOLOGIC A:   Anemia > no clear sign of bleeding, suspect dilution P:  Transfuse per protocol Trend cbc [place] heparin   INFECTIOUS A:   Septic shock due to peritonitis > improved Klebsiella CAP P:   Unasyn (completed)  ENDOCRINE A:   No acute issues P:   KVO D5  NEUROLOGIC A:   MS baseline > worse, has acute flare up P:   Cont pulse steroids per neurology    FAMILY  - Updates: updated [name] (HCPOA) 6/1  Discussion Awake and alert. Had another PEA event. Not sure what precipitated this. Spoke w/ cards. Agree PE a consideration. For today we will get CT chest. After that we will start tubefeeds. She seems stronger. If no issues over night & we can get her O2 down (I think we can); we should be able to resume weaning in  the am.   My ccm time 34 minutes.   Erick Colace ACNP-BC [place] Pager # [phone] OR # [phone] if no answer    12/16/2016, 10:20 AM

## 2016-12-16 NOTE — Plan of Care (Signed)
Called to bedside by Elink for Code blue.  Patient had pulses and was neurologically intact when I arrived.  Per nursing patient went PEA from no apparent cause, 62min of CPR and 1 round of Epi produced ROSC with good neurologic return.  -Patient with NSVT on tele - amiodarone 150mg  IV x1 given -EKG after amio shoed NSR -ABG, CMP, CBC, mag and phos ordered and drawn.    Meribeth Mattes, DO., MS [facility]

## 2016-12-16 NOTE — Progress Notes (Signed)
Pt. Bradycardic into 30's and into PEA. CPR started. One of Epi given. Regained pulses at 06:25am. Pt. In high rate A-Fib post-code. 150mg  of Amiodarone given. Orders per Dr. Arta Bruce. Pt. Now sustaining normal sinus rhythm to low sinus tach. Pt. Is alert and following commands, denies any pain. Will continue to closely monitor.

## 2016-12-16 NOTE — Progress Notes (Signed)
[facility] Progress Note Patient Name: Melissa Oconnell DOB: [birth year] MRN: [id number]   Date of Service  12/16/2016  HPI/Events of Note  Notified for need for stress ulcer prophylaxis.   eICU Interventions  Will order: 1. Protonix IV.      Intervention Category Intermediate Interventions: Best-practice therapies (e.g. DVT, beta blocker, etc.)  [name] 12/16/2016, 10:46 PM

## 2016-12-16 NOTE — Consult Note (Addendum)
Cardiology Consultation:   Patient ID: Melissa Oconnell; [id number]; 03-29-1956   Admit date: 12/09/2016 Date of Consult: 12/16/2016  Primary Care Provider: Care, Jinny Blossom Total Access Primary Cardiologist: New  Patient Profile:   Melissa Oconnell is a 61 y.o. female with a hx of multiple sclerosis and breast Ca, Presented 12/09/2016 with acute dysarthria and left-sided weakness, developed respiratory failure and hypotension secondary to septic shock in the setting of perforated sigmoid colon.   She has no previous known cardiac illness. She is being seen today for the evaluation of transient asystole and tachycardia at the request of Dr. Lake Bells.  History of Present Illness:   Melissa Oconnell has long-standing multiple sclerosis and presented with new focal neurological findings in the setting of severe hypotension and respiratory failure, resolved by resolution of hypotension. Developed findings of septic shock and was taken to the operating room for perforated sigmoid colon undergoing sigmoid colectomy and Hartmann's pouch on May 26.  Echocardiogram performed on May 28 showed normal left ventricular systolic function and dilation of the right heart chambers.There was a small to moderate pericardial effusion without signs of tamponade.  Extubated on May 30.  Neurology evaluation felt pulse steroids with Solu-Medrol 1000 mg IV daily for 5 days was indicated since weakness persisted even after correction of metabolic abnormalities and hemodynamic conditions.  On May 31 in the evening while eating had bradycardic arrest, probably secondary to aspiration. She was reintubated. Underwent bronchoscopy on June 1 with thick secretions aspirated from the left mainstem bronchus. Felt to have Klebsiella pneumonia.  Early this morning around 6:30 AM the patient developed gradual bradycardia and then felt to have pulseless electrical activity. She was intubated at the time. No hypoxemia was  reported. After 2 minutes of CPR and one dose of intravenous epinephrine spontaneous circulation was reestablished and she had a rapid rhythm at around 180 bpm. This was interpreted as showing ventricular tachycardia and she received a single dose of intravenous amiodarone 150 mg, with  rhythm thereafter clearly being sinus tachycardia.  On my review of the rhythm strips I think the patient had a supraventricular tachycardia, possibly atrial fibrillation, with frequent PVCs and/or aberrantly conducted supraventricular beats. I don't think she had ventricular tachycardia.  Current rhythm is normal sinus at 70 bpm.  Past Medical History:  Diagnosis Date  . Abnormality of gait   . Cancer ([place])    breast  . Memory loss   . Multiple sclerosis ([place])   . Neuromuscular disorder ([place])    multiple sclerosis  . Osteoporosis, unspecified 01/06/2014    Past Surgical History:  Procedure Laterality Date  . DIAGNOSTIC LAPAROSCOPY     "swallowed something"  . LAPAROTOMY N/A 12/10/2016   Procedure: EXPLORATORY LAPAROTOMY WITH SIGMOID COLECTOMY ANT HARTMAN'S POUCH;  Surgeon: Erroll Luna, MD;  Location: [place];  Service: General;  Laterality: N/A;  . MASTECTOMY Right   . MASTECTOMY W/ SENTINEL NODE BIOPSY Right 12/02/2013   Procedure: TOTAL MASTECTOMY WITH SENTINEL LYMPH NODE BIOPSY;  Surgeon: Adin Hector, MD;  Location: [facility];  Service: General;  Laterality: Right;  . TONSILLECTOMY       Inpatient Medications: Scheduled Meds: . chlorhexidine gluconate (MEDLINE KIT)  15 mL Mouth Rinse BID  . Chlorhexidine Gluconate Cloth  6 each Topical Once  . Chlorhexidine Gluconate Cloth  6 each Topical Daily  . heparin  5,000 Units Subcutaneous Q8H  . insulin aspart  2-6 Units Subcutaneous Q4H  . mouth rinse  15 mL  Mouth Rinse QID  . sodium chloride flush  10-40 mL Intracatheter Q12H   Continuous Infusions: . ampicillin-sulbactam (UNASYN) IV Stopped (12/16/16 0841)  . dextrose 5 % and 0.9% NaCl 10  mL/hr at 12/16/16 0813  . methylPREDNISolone (SOLU-MEDROL) injection Stopped (12/16/16 0949)   PRN Meds: acetaminophen, fentaNYL (SUBLIMAZE) injection, fentaNYL (SUBLIMAZE) injection, ondansetron (ZOFRAN) IV, sodium chloride flush  Allergies:   No Known Allergies  Social History:   Social History   Social History  . Marital status: Widowed    Spouse name: N/A  . Number of children: 2  . Years of education: N/A   Occupational History  . Not on file.   Social History Main Topics  . Smoking status: Former Smoker    Packs/day: 1.00    Years: 5.00    Types: Cigarettes  . Smokeless tobacco: Never Used     Comment: "quit smoking cigarettes in 1999"  . Alcohol use Yes  . Drug use: Yes    Types: Cocaine     Comment: 12/29/2014 "quit "2011"  . Sexual activity: No   Other Topics Concern  . Not on file   Social History Narrative   Patient lives at home with [name]. Patient has 2 children. Patient does not work.     Family History:   The patient's family history includes Cancer in her mother.  ROS:  Please see the history of present illness.  ROS  Unable to obtain complete review of systems since patient is intubated.  Physical Exam/Data:   Vitals:   12/16/16 0815 12/16/16 0820 12/16/16 0830 12/16/16 0845  BP: 102/65  99/69 99/70  Pulse: 72  71 73  Resp: '18  18 18  '$ Temp:  98.4 F (36.9 C)    TempSrc:  Oral    SpO2: 99%  100% 100%  Weight:      Height:        Intake/Output Summary (Last 24 hours) at 12/16/16 0943 Last data filed at 12/16/16 0811  Gross per 24 hour  Intake              952 ml  Output              630 ml  Net              322 ml   Filed Weights   12/14/16 0347 12/15/16 0500 12/16/16 0500  Weight: 129 lb 10.1 oz (58.8 kg) 135 lb 12.9 oz (61.6 kg) 135 lb 12.9 oz (61.6 kg)   Body mass index is 20.65 kg/m.  General:  Well nourished, well developed, in no acute distress. Intubated, but alert and responds to questions with knots and  shakes her head HEENT: normal Lymph: no adenopathy Neck: no JVD Endocrine:  No thryomegaly Vascular: No carotid bruits; FA pulses 2+ bilaterally without bruits  Cardiac:  normal S1, S2; RRR; no murmur  Lungs:  clear to auscultation bilaterally, no wheezing, rhonchi or rales  Abd: soft, nontender, no hepatomegaly  Ext: Swollen, somewhat tense left upper extremity between the elbow and the hand Musculoskeletal:  No deformities, BUE and BLE strength normal and equal Skin: warm and dry  Neuro:  CNs 2-12 intact, no focal abnormalities noted Psych:  Normal affect    EKG:  The EKG was personally reviewed and demonstrates sinus tachycardia, rather abrupt transition tall R waves in V3,, low voltage limb leads, no acute repolarization abnormalities   Relevant CV Studies:  echo 12/11/2016 - Left ventricle: The cavity size was normal.  Wall thickness was   normal. Systolic function was normal. The estimated ejection   fraction was in the range of 50% to 55%. Features are consistent   with a pseudonormal left ventricular filling pattern, with   concomitant abnormal relaxation and increased filling pressure   (grade 2 diastolic dysfunction). - Right ventricle: Systolic function was mildly to moderately   reduced. - Pericardium, extracardiac: A small to moderate pericardial   effusion was identified circumferential to the heart. There was   no evidence of hemodynamic compromise.  Laboratory Data:  Chemistry Recent Labs Lab 12/14/16 1852 12/15/16 0900 12/16/16 0630  NA 140 141 141  K 3.9 3.8 4.1  CL 112* 114* 110  CO2 15* 22 20*  GLUCOSE 297* 119* 122*  BUN 20 26* 33*  CREATININE 1.07* 0.88 1.03*  CALCIUM 7.5* 7.5* 7.8*  GFRNONAA 55* >60 58*  GFRAA >60 >60 >60  ANIONGAP '13 5 11     '$ Recent Labs Lab 12/09/16 1415 12/11/16 0415 12/16/16 0630  PROT 6.0* 4.5* 4.8*  ALBUMIN 2.9* 2.0* 1.6*  AST 36 64* 32  ALT 11* 33 33  ALKPHOS 60 50 152*  BILITOT 0.7 0.6 0.3    Hematology Recent Labs Lab 12/14/16 1852 12/15/16 0900 12/16/16 0630  WBC 17.4* 21.5* 26.7*  RBC 3.10* 2.89* 2.65*  HGB 9.3* 8.9* 8.2*  HCT 29.0* 26.2* 24.1*  MCV 93.5 90.7 90.9  MCH 30.0 30.8 30.9  MCHC 32.1 34.0 34.0  RDW 14.7 14.7 14.4  PLT 125* 123* 132*   Cardiac EnzymesNo results for input(s): TROPONINI in the last 168 hours.  Recent Labs Lab 12/09/16 1419  TROPIPOC 0.00    BNPNo results for input(s): BNP, PROBNP in the last 168 hours.  DDimer No results for input(s): DDIMER in the last 168 hours.  Radiology/Studies:  Dg Chest Port 1 View  Result Date: 12/15/2016 CLINICAL DATA:  Cardiac arrest. EXAM: PORTABLE CHEST 1 VIEW COMPARISON:  12/14/2016. FINDINGS: Endotracheal tube, NG tube, right IJ line stable position. Heart size stable. Diffuse bilateral pulmonary infiltrates/edema again noted. No pleural effusion or pneumothorax. IMPRESSION: 1.  Lines and tubes in stable position. 2. Diffuse bilateral pulmonary infiltrates/edema again noted. Left lower lobe atelectasis again noted. Small bilateral pleural effusions again noted. Chest is unchanged from prior exam . Electronically Signed   By: Marcello Moores  Register   On: 12/15/2016 06:51   Portable Chest Xray  Result Date: 12/14/2016 CLINICAL DATA:  Acute respiratory failure with hypoxia. Ventilator dependent. EXAM: PORTABLE CHEST 1 VIEW COMPARISON:  12/12/2016, 12/11/2016 and earlier. FINDINGS: Endotracheal tube tip remains in satisfactory position approximately 6 cm above the carina. Right jugular central venous catheter tip projects over the mid SVC. Nasogastric tube courses below the diaphragm into the stomach but its tip is not included on the image. External pacing pads overlie the chest. Left subclavian central venous catheter has been removed since the examination 2 days ago. Cardiac silhouette upper normal in size to slightly enlarged. Interval development of perihilar airspace pulmonary edema. Dense consolidation in the left  lower lobe, not significantly changed. Bilateral pleural effusions are suspected. IMPRESSION: 1.  Support apparatus satisfactory. 2. Acute perihilar airspace pulmonary edema, new since the examination 2 days ago. 3. Stable dense left lower lobe atelectasis and/or pneumonia. Small bilateral pleural effusions are suspected. Electronically Signed   By: Evangeline Dakin M.D.   On: 12/14/2016 19:56   Dg Abd Portable 1v  Result Date: 12/14/2016 CLINICAL DATA:  OG tube placement EXAM: PORTABLE ABDOMEN - 1 VIEW  COMPARISON:  None. FINDINGS: AP portable supine view the abdomen at 1952 hours. Only the left upper quadrant has been included on the film. OG tube tip overlies the proximal stomach. Proximal port of the OG tube is at or just distal to the EG junction. Defibrillator pads overlie the lower left hemithorax. Mild gaseous bowel distention noted in the left abdomen. IMPRESSION: OG tube tip is in the proximal stomach. Electronically Signed   By: Misty Stanley M.D.   On: 12/14/2016 20:06    Assessment and Plan:   1. Bradycardia/PEA: Suspect respiratory cause. ABG performed immediately afterwards showed significant hypoxemia with a PO2 of 45 and a significant Aa gradient. She has been receiving prophylactic doses of heparin and has sequential compression devices on, but remains at high risk for pulmonary embolism. This should be excluded as a cause of the hypoxemia and the arrest. A previous DVT/PE could also explain the presence of right heart dilatation on the initial echocardiogram. Relatively low level of suspicion for coronary artery disease in this patient. Also note the complete absence of any vascular calcifications seen in the coronaries/abdominal visceral artery/pelvic arteries on her CT scan. No evidence of significant cardiac conduction abnormalities on ECG. 2. SVT: I don't think she had true ventricular tachycardia. She did have intermittent aberrancy in the setting of rapid SVT in the 170-180 range.  This occurred after administration of epinephrine and was likely related to the effects of this drug. Specific therapy not indicated at this time 3. Pericardial effusion: On the echo performed on May 28 this was too small to be clinically significant and there were no echo signs of hemodynamic importance, it may explain the low voltage on her EKG and should be reevaluated with a limited echo. 4. Acute respiratory failure: Initially related to septic shock, now probably related to aspiration pneumonia.  This is a critically ill patient with multiple organ system involvement. Complex decision making.   Signed, Sanda Klein, MD  12/16/2016 9:43 AM  Time spent directly with the patient 55 minutes

## 2016-12-16 NOTE — Progress Notes (Signed)
ANTICOAGULATION CONSULT NOTE - Initial Consult  Pharmacy Consult for heparin Indication: pulmonary embolus  No Known Allergies  Patient Measurements: Height: 5\' 8"  (172.7 cm) Weight: 135 lb 12.9 oz (61.6 kg) IBW/kg (Calculated) : 63.9 Heparin Dosing Weight: 61 kg  Vital Signs: Temp: 98.6 F (37 C) (06/02 1159) Temp Source: Oral (06/02 1159) BP: 99/70 (06/02 0845) Pulse Rate: 73 (06/02 0845)  Labs:  Recent Labs  12/14/16 1852 12/15/16 0900 12/16/16 0630  HGB 9.3* 8.9* 8.2*  HCT 29.0* 26.2* 24.1*  PLT 125* 123* 132*  CREATININE 1.07* 0.88 1.03*    Estimated Creatinine Clearance: 56.5 mL/min (A) (by C-G formula based on SCr of 1.03 mg/dL (H)).   Medical History: Past Medical History:  Diagnosis Date  . Abnormality of gait   . Cancer ([place])    breast  . Memory loss   . Multiple sclerosis ([place])   . Neuromuscular disorder ([place])    multiple sclerosis  . Osteoporosis, unspecified 01/06/2014    Assessment: 61 yo F admitted 12/09/2016 with perforated sigmoid colon s/p exlap, colostomy on 5/27. Since then she has had two cardiac arrests, with most recent being this AM. Patient diagnosed with acute PE and pharmacy consulted to start heparin. Patient was previously on SQ heparin prophylaxis with last dose administered 6/2 @ 1322.  Hgb 8.2, Plt 132, No current signs/symptoms of bleeding noted  Goal of Therapy:  Heparin level 0.3-0.7 units/ml Monitor platelets by anticoagulation protocol: Yes   Plan:  - Will not bolus due to recent SQ heparin administration - Start heparin infusion at 1000 units/hr - Monitor 6 hour heparin level - Monitor daily heparin level, CBC and signs/symptoms of bleeding  Dimitri Ped, PharmD, BCPS PGY-2 Infectious Diseases Pharmacy Resident Pager: [phone] 12/16/2016,1:18 PM

## 2016-12-16 NOTE — Progress Notes (Signed)
ANTICOAGULATION CONSULT NOTE - Follow Up Consult  Pharmacy Consult for heparin Indication: pulmonary embolus  No Known Allergies  Patient Measurements: Height: 5\' 8"  (172.7 cm) Weight: 135 lb 12.9 oz (61.6 kg) IBW/kg (Calculated) : 63.9 Heparin Dosing Weight: 61 kg  Vital Signs: Temp: 97.8 F (36.6 C) (06/02 2039) Temp Source: Oral (06/02 2039) BP: 108/71 (06/02 1845) Pulse Rate: 72 (06/02 1954)  Labs:  Recent Labs  12/14/16 1852 12/15/16 0900 12/16/16 0630 12/16/16 1418 12/16/16 1625 12/16/16 2004  HGB 9.3* 8.9* 8.2*  --   --   --   HCT 29.0* 26.2* 24.1*  --   --   --   PLT 125* 123* 132*  --   --   --   APTT  --   --   --  35  --   --   LABPROT  --   --   --   --  17.8*  --   INR  --   --   --   --  1.45  --   HEPARINUNFRC  --   --   --   --   --  0.48  CREATININE 1.07* 0.88 1.03*  --   --   --     Estimated Creatinine Clearance: 56.5 mL/min (A) (by C-G formula based on SCr of 1.03 mg/dL (H)).   Medications:  Infusions:  . dextrose 5% lactated ringers 75 mL/hr at 12/16/16 1259  . feeding supplement (VITAL AF 1.2 CAL) 1,000 mL (12/16/16 1626)  . heparin 1,000 Units/hr (12/16/16 1348)  . methylPREDNISolone (SOLU-MEDROL) injection Stopped (12/16/16 0949)  . piperacillin-tazobactam (ZOSYN)  IV    . [START ON 12/17/2016] vancomycin      Assessment: 61 yo F admitted 12/09/2016 with perforated sigmoid colon s/p exlap, colostomy on 5/27. Since then she has had two cardiac arrests, with most recent being this AM. Patient diagnosed with acute PE and pharmacy consulted to start heparin. Patient was previously on SQ heparin prophylaxis with last dose administered 6/2 @ 1322.  Heparin level 0.48 (therapeutic), Hgb 8.2, Plt 132, No current signs/symptoms of bleeding noted  Goal of Therapy:  Heparin level 0.3-0.7 units/ml Monitor platelets by anticoagulation protocol: Yes   Plan:  - Continue heparin 1000 units/hr - Monitor daily heparin level, CBC and signs/symptoms  of bleeding  Dimitri Ped, PharmD, BCPS PGY-2 Infectious Diseases Pharmacy Resident Pager: [phone] 12/16/2016,8:41 PM

## 2016-12-17 ENCOUNTER — Inpatient Hospital Stay (HOSPITAL_COMMUNITY): Payer: Medicare Other

## 2016-12-17 DIAGNOSIS — Z95828 Presence of other vascular implants and grafts: Secondary | ICD-10-CM

## 2016-12-17 LAB — BLOOD GAS, ARTERIAL
Acid-Base Excess: 0.3 mmol/L (ref 0.0–2.0)
Bicarbonate: 23.6 mmol/L (ref 20.0–28.0)
DRAWN BY: 10006
FIO2: 40
MECHVT: 500 mL
O2 Saturation: 98.4 %
PEEP: 5 cmH2O
PO2 ART: 139 mmHg — AB (ref 83.0–108.0)
Patient temperature: 98.6
RATE: 18 resp/min
pCO2 arterial: 32.8 mmHg (ref 32.0–48.0)
pH, Arterial: 7.471 — ABNORMAL HIGH (ref 7.350–7.450)

## 2016-12-17 LAB — GLUCOSE, CAPILLARY
GLUCOSE-CAPILLARY: 125 mg/dL — AB (ref 65–99)
GLUCOSE-CAPILLARY: 137 mg/dL — AB (ref 65–99)
GLUCOSE-CAPILLARY: 147 mg/dL — AB (ref 65–99)
GLUCOSE-CAPILLARY: 155 mg/dL — AB (ref 65–99)
Glucose-Capillary: 113 mg/dL — ABNORMAL HIGH (ref 65–99)
Glucose-Capillary: 143 mg/dL — ABNORMAL HIGH (ref 65–99)
Glucose-Capillary: 152 mg/dL — ABNORMAL HIGH (ref 65–99)

## 2016-12-17 LAB — BASIC METABOLIC PANEL
Anion gap: 7 (ref 5–15)
BUN: 34 mg/dL — AB (ref 6–20)
CO2: 24 mmol/L (ref 22–32)
Calcium: 7.4 mg/dL — ABNORMAL LOW (ref 8.9–10.3)
Chloride: 116 mmol/L — ABNORMAL HIGH (ref 101–111)
Creatinine, Ser: 0.75 mg/dL (ref 0.44–1.00)
GFR calc Af Amer: >60 mL/min (ref >60)
GLUCOSE: 144 mg/dL — AB (ref 65–99)
Potassium: 3.2 mmol/L — ABNORMAL LOW (ref 3.5–5.1)
Sodium: 147 mmol/L — ABNORMAL HIGH (ref 135–145)

## 2016-12-17 LAB — PHOSPHORUS: PHOSPHORUS: 2.7 mg/dL (ref 2.5–4.6)

## 2016-12-17 LAB — MAGNESIUM: MAGNESIUM: 2 mg/dL (ref 1.7–2.4)

## 2016-12-17 LAB — CBC
HEMATOCRIT: 23.5 % — AB (ref 36.0–46.0)
Hemoglobin: 7.9 g/dL — ABNORMAL LOW (ref 12.0–15.0)
MCH: 30.4 pg (ref 26.0–34.0)
MCHC: 33.6 g/dL (ref 30.0–36.0)
MCV: 90.4 fL (ref 78.0–100.0)
Platelets: 159 10*3/uL (ref 150–400)
RBC: 2.6 MIL/uL — ABNORMAL LOW (ref 3.87–5.11)
RDW: 14.5 % (ref 11.5–15.5)
WBC: 21.9 10*3/uL — ABNORMAL HIGH (ref 4.0–10.5)

## 2016-12-17 LAB — HEPARIN LEVEL (UNFRACTIONATED): HEPARIN UNFRACTIONATED: 0.55 [IU]/mL (ref 0.30–0.70)

## 2016-12-17 MED ORDER — FREE WATER
200.0000 mL | Freq: Four times a day (QID) | Status: DC
  Administered 2016-12-17 – 2016-12-18 (×4): 200 mL

## 2016-12-17 MED ORDER — ATROPINE SULFATE 1 MG/10ML IJ SOSY
PREFILLED_SYRINGE | INTRAMUSCULAR | Status: AC
  Filled 2016-12-17: qty 10

## 2016-12-17 MED ORDER — POTASSIUM CHLORIDE 20 MEQ/15ML (10%) PO SOLN
40.0000 meq | ORAL | Status: AC
  Administered 2016-12-17 (×2): 40 meq
  Filled 2016-12-17 (×2): qty 30

## 2016-12-17 NOTE — Progress Notes (Signed)
ANTICOAGULATION CONSULT NOTE - Follow Up Consult  Pharmacy Consult for heparin Indication: pulmonary embolus  No Known Allergies  Patient Measurements: Height: 5\' 8"  (172.7 cm) Weight: 135 lb 12.9 oz (61.6 kg) IBW/kg (Calculated) : 63.9 Heparin Dosing Weight: 61 kg  Vital Signs: Temp: 98.6 F (37 C) (06/03 0825) Temp Source: Oral (06/03 0825) BP: 99/68 (06/03 0900) Pulse Rate: 72 (06/03 0900)  Labs:  Recent Labs  12/15/16 0900 12/16/16 0630 12/16/16 1418 12/16/16 1625 12/16/16 2004 12/17/16 0332 12/17/16 0333  HGB 8.9* 8.2*  --   --   --  7.9*  --   HCT 26.2* 24.1*  --   --   --  23.5*  --   PLT 123* 132*  --   --   --  159  --   APTT  --   --  35  --   --   --   --   LABPROT  --   --   --  17.8*  --   --   --   INR  --   --   --  1.45  --   --   --   HEPARINUNFRC  --   --   --   --  0.48  --  0.55  CREATININE 0.88 1.03*  --   --   --  0.75  --     Estimated Creatinine Clearance: 72.7 mL/min (by C-G formula based on SCr of 0.75 mg/dL).   Medications:  Infusions:  . dextrose 5% lactated ringers 75 mL/hr at 12/17/16 0717  . feeding supplement (VITAL AF 1.2 CAL) 1,000 mL (12/16/16 1626)  . heparin 1,000 Units/hr (12/16/16 1348)  . methylPREDNISolone (SOLU-MEDROL) injection Stopped (12/16/16 0949)  . piperacillin-tazobactam (ZOSYN)  IV Stopped (12/17/16 0942)  . vancomycin Stopped (12/17/16 0451)    Assessment: 61 yo F admitted 12/09/2016 with perforated sigmoid colon s/p exlap, colostomy on 5/27. Since then she has had two cardiac arrests, with most recent being this AM. Patient diagnosed with acute PE and pharmacy consulted to start heparin.  Heparin level 0.55 (therapeutic), Hgb 7.9, Plt wnl, No current signs/symptoms of bleeding noted  Goal of Therapy:  Heparin level 0.3-0.7 units/ml Monitor platelets by anticoagulation protocol: Yes   Plan:  - Continue heparin 1000 units/hr - Monitor daily heparin level, CBC and signs/symptoms of bleeding  Dimitri Ped, PharmD, BCPS PGY-2 Infectious Diseases Pharmacy Resident Pager: [phone] 12/17/2016,10:33 AM

## 2016-12-17 NOTE — Progress Notes (Signed)
PULMONARY / CRITICAL CARE MEDICINE   Name: Melissa Oconnell MRN: [id number] DOB: 1956-04-13    ADMISSION DATE:  12/09/2016 CONSULTATION DATE:  12/09/2016   REFERRING MD:  Jeanell Sparrow  CHIEF COMPLAINT:  confusion  HISTORY OF PRESENT ILLNESS:   61 y/o with MS, had perforated sigmoid colon, to OR, VDRF. Septic shock.  SUBJECTIVE:  On pressure support ventilation   VITAL SIGNS: BP 115/69   Pulse 86   Temp 98.7 F (37.1 C) (Oral)   Resp 14   Ht 5\' 8"  (1.727 m)   Wt 61.6 kg (135 lb 12.9 oz)   SpO2 100%   BMI 20.65 kg/m   HEMODYNAMICS:    VENTILATOR SETTINGS: Vent Mode: PSV;CPAP FiO2 (%):  [40 %] 40 % Set Rate:  [18 bmp] 18 bmp Vt Set:  [500 mL] 500 mL PEEP:  [5 cmH20] 5 cmH20 Pressure Support:  [12 cmH20] 12 cmH20 Plateau Pressure:  [15 cmH20-16 cmH20] 15 cmH20  INTAKE / OUTPUT:  Intake/Output Summary (Last 24 hours) at 12/17/16 1411 Last data filed at 12/17/16 1400  Gross per 24 hour  Intake          3986.33 ml  Output             1950 ml  Net          2036.33 ml   PHYSICAL EXAMINATION:   General:  Resting comfortably in bed HENT: NCAT ETT in place PULM: CTA B, vent supported breathing CV: RRR, no mgr GI: BS+, soft, nontender MSK: normal bulk and tone Neuro: awake, alert, no distress, seems stronger, raising arms at shoulders bilaterally   LABS:  BMET  Recent Labs Lab 12/15/16 0900 12/16/16 0630 12/17/16 0332  NA 141 141 147*  K 3.8 4.1 3.2*  CL 114* 110 116*  CO2 22 20* 24  BUN 26* 33* 34*  CREATININE 0.88 1.03* 0.75  GLUCOSE 119* 122* 144*    Electrolytes  Recent Labs Lab 12/15/16 0900 12/16/16 0630 12/16/16 1150 12/16/16 1625 12/17/16 0332  CALCIUM 7.5* 7.8*  --   --  7.4*  MG  --  2.3 2.2 2.2 2.0  PHOS  --  4.3 3.3 3.3 2.7   CBC  Recent Labs Lab 12/15/16 0900 12/16/16 0630 12/17/16 0332  WBC 21.5* 26.7* 21.9*  HGB 8.9* 8.2* 7.9*  HCT 26.2* 24.1* 23.5*  PLT 123* 132* 159    Coag's  Recent Labs Lab 12/16/16 1418  12/16/16 1625  APTT 35  --   INR  --  1.45    Sepsis Markers  Recent Labs Lab 12/14/16 1917 12/14/16 2330  LATICACIDVEN 8.5* 2.9*    ABG  Recent Labs Lab 12/14/16 2048 12/16/16 0649 12/17/16 0440  PHART 7.322* 7.453* 7.471*  PCO2ART 29.7* 28.5* 32.8  PO2ART 91.0 45.0* 139*    Liver Enzymes  Recent Labs Lab 12/11/16 0415 12/16/16 0630  AST 64* 32  ALT 33 33  ALKPHOS 50 152*  BILITOT 0.6 0.3  ALBUMIN 2.0* 1.6*    Cardiac Enzymes No results for input(s): TROPONINI, PROBNP in the last 168 hours.  Glucose  Recent Labs Lab 12/16/16 1608 12/16/16 2024 12/17/16 0009 12/17/16 0423 12/17/16 0824 12/17/16 1150  GLUCAP 163* 145* 137* 125* 152* 113*    Imaging Dg Chest Port 1 View  Result Date: 12/17/2016 CLINICAL DATA:  Respiratory failure.  Ventilator dependence. EXAM: PORTABLE CHEST 1 VIEW COMPARISON:  12/15/2016 FINDINGS: Endotracheal tube tip is approximately 3 cm above the base of the carina. The NG tube  passes into the stomach although the distal tip position is not included on the film. Right IJ central line tip overlies proximal SVC level. Cardiopericardial silhouette is at upper limits of normal for size. Left base collapse/ consolidation similar to prior. Interval slight improvement in aeration at the right lung base. Probable tiny bilateral pleural effusions. Telemetry leads overlie the chest. Deferred later pads overlie the left chest. IMPRESSION: Slight interval improvement in right base aeration. Otherwise largely stable exam. Electronically Signed   By: Misty Stanley M.D.   On: 12/17/2016 07:42   6/3 CXR images independently reviewed, improved aeration RLL  STUDIES:  5/26 ct head/neck angiogram> no intracranial stenosis 5/27 CT abdomen> free air, sigmoid colon swelling 5/28 echo: Left ventricle:  The cavity size was normal. Wall thickness was normal. Systolic function was normal. The estimated ejection fraction was in the range of 50% to 55%.  Features are consistent with a pseudonormal left ventricular filling pattern, with concomitant abnormal relaxation and increased filling pressure (grade 2 diastolic dysfunction).Right ventricle:  The cavity size was normal. Systolic function was mildly to moderately reduced. 6/3 Echo repeat >   CULTURES: 5/27 resp > klebsiella, GPC 5/26 blood > neg   ANTIBIOTICS: 5/26 vanc > 5/29 5/26 zosyn > 5/29 5/29 unasyn > 6/2  6/2 vanc >  6/2 Zosyn >   SIGNIFICANT EVENTS: 5/27 OR exlap, colostomy 5/31 Cardiac arrest post extubation 6/2 arrested in am. PEA. Then NSVT. Cards called.   LINES/TUBES: 5/26 ETT > 5/30 5/26 left subclavian CVL> 6/1 6/1 R IJ CVL >   DISCUSSION: 61 y/o female with MS baseline who had a perforated sigmoid colon leading to sepsis, encephalopathy, respiratory failure.  Aspiration event with cardiac arrest on 5/31, re-intubated, bronched food removed.  Then bradycardia/PEA arrest on 6/2 early AM, possibly related to pneumonia/respiratory failure with hypoxemia (though on vent, unclear cause).  ASSESSMENT / PLAN:  PULMONARY A: Acute respiratory failure with hypoxemia  Aspiration 5/31 PE> small HCAP/aspiration pneumonia 6/1 P:   Full vent support Heparin VAP prevention PSV all day today day shift Likely extubation tomorrow > if fails then trach  CARDIOVASCULAR A:  Septic shock > resolved Cardiac arrest x2  RV dilation> cause of arrest? In setting of severe pneumonia and small PE P:  Cont tele Continue heparin per pharmacy, would prefer to hold off on oral agents until we know if she will need trach or not Further recs per cards  RENAL A:   AKI Hypernatremia Hypokalemia P:   Monitor BMET and UOP Replace electrolytes as needed Add free water  GASTROINTESTINAL A:   Sigmoid colon perforation S/p ostomy P:   Wound care per surgery  Continue tubefeeds  HEMATOLOGIC A:   Anemia > no clear sign of bleeding, suspect dilution P:  Transfuse if  Hgb < 7gm/dL Monitor for bleeding  INFECTIOUS A:   Septic shock due to peritonitis > improved Klebsiella CAP HCAP P:   Unasyn (completed)  ENDOCRINE A:   No acute issues P:   Monitor glucose  NEUROLOGIC A:   MS baseline > worse, has acute flare up P:   Cont pulse steroids per neurology  Likely extubation 6/3   FAMILY  - Updates: updated [name] (HCPOA) 6/2  My cc time 35 minutes   Roselie Awkward, MD [facility] Pager: [phone] Cell: [phone] After 3pm or if no response, call [phone]

## 2016-12-17 NOTE — Progress Notes (Signed)
RT note- Placed back to full support, low VE. 

## 2016-12-17 NOTE — Progress Notes (Signed)
[facility] Progress Note Patient Name: Melissa Oconnell DOB: 02-18-1956 MRN: [id number]   Date of Service  12/17/2016  HPI/Events of Note  Hypokalemia  eICU Interventions  Potassium replaced     Intervention Category Intermediate Interventions: Electrolyte abnormality - evaluation and management  DETERDING,ELIZABETH 12/17/2016, 5:06 AM

## 2016-12-17 NOTE — Progress Notes (Signed)
Progress Note  Patient Name: Melissa Oconnell Date of Encounter: 12/17/2016  Primary Cardiologist: New  Subjective  Melissa Oconnell is a 61 y.o. female with a hx of multiple sclerosis and breast Ca, Presented 12/09/2016 with acute dysarthria and left-sided weakness, developed respiratory failure and hypotension secondary to septic shock in the setting of perforated sigmoid colon.   Had transient severe bradycardia and PEA likely due to respiratory distress (severe lung consolidation and sall superimposed PE).  No further significant arrhythmia.   Inpatient Medications    Scheduled Meds: . chlorhexidine gluconate (MEDLINE KIT)  15 mL Mouth Rinse BID  . Chlorhexidine Gluconate Cloth  6 each Topical Once  . Chlorhexidine Gluconate Cloth  6 each Topical Daily  . insulin aspart  2-6 Units Subcutaneous Q4H  . mouth rinse  15 mL Mouth Rinse QID  . pantoprazole (PROTONIX) IV  40 mg Intravenous Q24H  . sodium chloride flush  10-40 mL Intracatheter Q12H   Continuous Infusions: . dextrose 5% lactated ringers 75 mL/hr at 12/17/16 0717  . feeding supplement (VITAL AF 1.2 CAL) 1,000 mL (12/16/16 1626)  . heparin 1,000 Units/hr (12/16/16 1348)  . methylPREDNISolone (SOLU-MEDROL) injection Stopped (12/16/16 0949)  . piperacillin-tazobactam (ZOSYN)  IV Stopped (12/17/16 0942)  . vancomycin Stopped (12/17/16 0451)   PRN Meds: acetaminophen, fentaNYL (SUBLIMAZE) injection, fentaNYL (SUBLIMAZE) injection, ondansetron (ZOFRAN) IV, sodium chloride flush   Vital Signs    Vitals:   12/17/16 0740 12/17/16 0800 12/17/16 0825 12/17/16 0900  BP: 100/62 105/65  99/68  Pulse: 65 69  72  Resp: _0 Temp:   98.6 F (37 C)   TempSrc:   Oral   SpO2: 100% 100%  100%  Weight:      Height:        Intake/Output Summary (Last 24 hours) at 12/17/16 1010 Last data filed at 12/17/16 0900  Gross per 24 hour  Intake          3381.58 ml  Output             1735 ml  Net          1646.58 ml   Filed  Weights   12/15/16 0500 12/16/16 0500 12/17/16 0444  Weight: 135 lb 12.9 oz (61.6 kg) 135 lb 12.9 oz (61.6 kg) 135 lb 12.9 oz (61.6 kg)    Telemetry    SR with frequent PACs, mild sinus bradycardia when asleep - Personally Reviewed  ECG    sinus tachycardia, rather abrupt transition tall R waves in V3,, low voltage limb leads, no acute repolarization abnormalities  - Personally Reviewed  Physical Exam  Asleep, awakes easily, intubated GEN: No acute distress.   Neck: No JVD Cardiac: RRR, no murmurs, rubs, or gallops.  Respiratory: Clear to auscultation bilaterally. GI: Soft, nontender, non-distended  MS: Swollen, somewhat tense left upper extremity between the elbow and the hand (lymphedema?); No deformity. Neuro:  Nonfocal  Psych: Normal affect   Labs    Chemistry Recent Labs Lab 12/11/16 0415  12/15/16 0900 12/16/16 0630 12/17/16 0332  NA 130*  < > 141 141 147*  K 4.1  < > 3.8 4.1 3.2*  CL 105  < > 114* 110 116*  CO2 18*  < > 22 20* 24  GLUCOSE 50*  < > 119* 122* 144*  BUN 17  < > 26* 33* 34*  CREATININE 1.01*  < > 0.88 1.03* 0.75  CALCIUM 7.1*  < > 7.5* 7.8* 7.4*  PROT 4.5*  --   --  4.8*  --   ALBUMIN 2.0*  --   --  1.6*  --   AST 64*  --   --  32  --   ALT 33  --   --  33  --   ALKPHOS 50  --   --  152*  --   BILITOT 0.6  --   --  0.3  --   GFRNONAA 59*  < > >60 58* >60  GFRAA >60  < > >60 >60 >60  ANIONGAP 7  < > _0 < > = values in this interval not displayed.   Hematology Recent Labs Lab 12/15/16 0900 12/16/16 0630 12/17/16 0332  WBC 21.5* 26.7* 21.9*  RBC 2.89* 2.65* 2.60*  HGB 8.9* 8.2* 7.9*  HCT 26.2* 24.1* 23.5*  MCV 90.7 90.9 90.4  MCH 30.8 30.9 30.4  MCHC 34.0 34.0 33.6  RDW 14.7 14.4 14.5  PLT 123* 132* 159    Cardiac EnzymesNo results for input(s): TROPONINI in the last 168 hours. No results for input(s): TROPIPOC in the last 168 hours.   BNPNo results for input(s): BNP, PROBNP in the last 168 hours.   DDimer No results for  input(s): DDIMER in the last 168 hours.   Radiology    Ct Angio Chest Pe W Or Wo Contrast  Result Date: 12/16/2016 CLINICAL DATA:  Cardiac arrest. EXAM: CT ANGIOGRAPHY CHEST WITH CONTRAST TECHNIQUE: Multidetector CT imaging of the chest was performed using the standard protocol during bolus administration of intravenous contrast. Multiplanar CT image reconstructions and MIPs were obtained to evaluate the vascular anatomy. COMPARISON:  None. FINDINGS: Cardiovascular: Heart size mildly increased. No substantial pericardial effusion. No thoracic aortic aneurysm. No evidence for dissection of the thoracic aorta. No large central pulmonary embolus. No evidence for pulmonary embolus to the right lung (see image 155 series 15). Right IJ central line tip positioned mid SVC. Mediastinum/Nodes: Endotracheal and NG tubes are evident. No mediastinal lymphadenopathy. All soft tissue attenuation in the left hilum likely related to consolidative change in the central lung. There is no axillary lymphadenopathy. Lungs/Pleura: Left mainstem bronchus is occluded. Dense airspace consolidation identified posterior left upper lobe and diffusely in the left lower lobe. There is right lower lobe collapse/consolidation. Centrilobular and paraseptal emphysema noted bilaterally. Upper Abdomen: Unremarkable. Musculoskeletal: Diffuse body wall edema evident. Bone windows reveal no worrisome lytic or sclerotic osseous lesions. Review of the MIP images confirms the above findings. IMPRESSION: 1. Trace nonobstructive pulmonary embolus to a segmental branch of the left lower lobe. 2. Dense airspace consolidation posterior left upper lobe and diffusely in the left lower lobe with occluded left mainstem bronchus. 3. Small bilateral pleural effusions. Critical Value/emergent results were called by me at the time of interpretation on 12/16/2016 at 1:13 pm to [facility] , who verbally acknowledged these results. Electronically Signed   By: Misty Stanley M.D.   On: 12/16/2016 13:14   Dg Chest Port 1 View  Result Date: 12/17/2016 CLINICAL DATA:  Respiratory failure.  Ventilator dependence. EXAM: PORTABLE CHEST 1 VIEW COMPARISON:  12/15/2016 FINDINGS: Endotracheal tube tip is approximately 3 cm above the base of the carina. The NG tube passes into the stomach although the distal tip position is not included on the film. Right IJ central line tip overlies proximal SVC level. Cardiopericardial silhouette is at upper limits of normal for size. Left base collapse/ consolidation similar to prior. Interval slight improvement in aeration at  the right lung base. Probable tiny bilateral pleural effusions. Telemetry leads overlie the chest. Deferred later pads overlie the left chest. IMPRESSION: Slight interval improvement in right base aeration. Otherwise largely stable exam. Electronically Signed   By: Misty Stanley M.D.   On: 12/17/2016 07:42    Cardiac Studies   Relevant CV Studies:  echo 12/11/2016 - Left ventricle: The cavity size was normal. Wall thickness was normal. Systolic function was normal. The estimated ejection fraction was in the range of 50% to 55%. Features are consistent with a pseudonormal left ventricular filling pattern, with concomitant abnormal relaxation and increased filling pressure (grade 2 diastolic dysfunction). - Right ventricle: Systolic function was mildly to moderately reduced. - Pericardium, extracardiac: A small to moderate pericardial effusion was identified circumferential to the heart. There was no evidence of hemodynamic compromise  F/U echo for the pericardial effusion is pending  Patient Profile     61 y.o. female with a hx of multiple sclerosis and breast Ca, Presented 12/09/2016 with acute dysarthria and left-sided weakness, developed respiratory failure and hypotension secondary to septic shock in the setting of perforated sigmoid colon.   Had transient severe bradycardia and PEA  likely due to respiratory distress (severe lung consolidation and sall superimposed PE).   Assessment & Plan    1. Brady/PEA arrest:  Likely respiratory trigger. Has not recurred. 2. Small-mod pericardia effusion: uncertain etiology, not hemodynamically important, follow up echo today. 3. Acute respiratory failure: Initially related to septic shock, now probably related to aspiration pneumonia. 4. Subsegmental PE: on IV heparin  Signed, Sanda Klein, MD  12/17/2016, 10:10 AM

## 2016-12-18 ENCOUNTER — Inpatient Hospital Stay (HOSPITAL_COMMUNITY): Payer: Medicare Other

## 2016-12-18 DIAGNOSIS — I319 Disease of pericardium, unspecified: Secondary | ICD-10-CM

## 2016-12-18 LAB — GLUCOSE, CAPILLARY
GLUCOSE-CAPILLARY: 133 mg/dL — AB (ref 65–99)
GLUCOSE-CAPILLARY: 114 mg/dL — AB (ref 65–99)
GLUCOSE-CAPILLARY: 123 mg/dL — AB (ref 65–99)
Glucose-Capillary: 111 mg/dL — ABNORMAL HIGH (ref 65–99)
Glucose-Capillary: 119 mg/dL — ABNORMAL HIGH (ref 65–99)
Glucose-Capillary: 88 mg/dL (ref 65–99)

## 2016-12-18 LAB — BASIC METABOLIC PANEL
ANION GAP: 4 — AB (ref 5–15)
BUN: 29 mg/dL — ABNORMAL HIGH (ref 6–20)
CALCIUM: 7.6 mg/dL — AB (ref 8.9–10.3)
CO2: 26 mmol/L (ref 22–32)
Chloride: 117 mmol/L — ABNORMAL HIGH (ref 101–111)
Creatinine, Ser: 0.69 mg/dL (ref 0.44–1.00)
GFR calc non Af Amer: >60 mL/min (ref >60)
GLUCOSE: 155 mg/dL — AB (ref 65–99)
Potassium: 3.4 mmol/L — ABNORMAL LOW (ref 3.5–5.1)
Sodium: 147 mmol/L — ABNORMAL HIGH (ref 135–145)

## 2016-12-18 LAB — ECHOCARDIOGRAM LIMITED
HEIGHTINCHES: 68 in
WEIGHTICAEL: 2289.26 [oz_av]

## 2016-12-18 LAB — HEPARIN LEVEL (UNFRACTIONATED): Heparin Unfractionated: 0.6 IU/mL (ref 0.30–0.70)

## 2016-12-18 LAB — POCT I-STAT 3, ART BLOOD GAS (G3+)
Acid-Base Excess: 2 mmol/L (ref 0.0–2.0)
Bicarbonate: 26.2 mmol/L (ref 20.0–28.0)
O2 SAT: 89 %
PCO2 ART: 35.3 mmHg (ref 32.0–48.0)
PH ART: 7.477 — AB (ref 7.350–7.450)
Patient temperature: 97.6
TCO2: 27 mmol/L (ref 0–100)
pO2, Arterial: 51 mmHg — ABNORMAL LOW (ref 83.0–108.0)

## 2016-12-18 MED ORDER — FENTANYL CITRATE (PF) 100 MCG/2ML IJ SOLN: 12.5000 ug | INTRAMUSCULAR | Status: DC | PRN

## 2016-12-18 MED ORDER — FUROSEMIDE 10 MG/ML IJ SOLN
40.0000 mg | Freq: Once | INTRAMUSCULAR | Status: AC
Start: 2016-12-18 — End: 2016-12-18
  Administered 2016-12-18: 40 mg via INTRAVENOUS
  Filled 2016-12-18: qty 4

## 2016-12-18 MED ORDER — LORAZEPAM 2 MG/ML IJ SOLN: 2.0000 mg | Freq: Once | INTRAMUSCULAR | Status: DC

## 2016-12-18 MED ORDER — FREE WATER
200.0000 mL | Status: DC
  Administered 2016-12-18 – 2016-12-22 (×29): 200 mL

## 2016-12-18 MED ORDER — POTASSIUM CHLORIDE 20 MEQ/15ML (10%) PO SOLN
40.0000 meq | Freq: Once | ORAL | Status: AC
  Administered 2016-12-18: 40 meq
  Filled 2016-12-18: qty 30

## 2016-12-18 NOTE — Evaluation (Signed)
Occupational Therapy Evaluation Patient Details Name: Melissa Oconnell MRN: [id number] DOB: [birth year] Today's Date: 12/18/2016    History of Present Illness 61 yo with h/o MS and breast CA admitted with bowel perforation s/p ostomy with VAC and significant weakness. VDRF 5/26-5/30; back on vent 6/1   Clinical Impression   This 61 yo female admitted and underwent above presents to acute OT with deficits below (see OT problem list ) thus affecting her PLOF of being able to do her own basic ADLs with set up/S. She will benefit from acute OT with follow up OT on CIR to get back to PLOF to return home with caregiver.    Follow Up Recommendations  CIR;Supervision/Assistance - 24 hour    Equipment Recommendations  Other (comment) (TBD at next venue)    Recommendations for Other Services Rehab consult     Precautions / Restrictions Precautions Precautions: Fall Precaution Comments: VAC, jp drain; abdominal incision Restrictions Weight Bearing Restrictions: No      Mobility Bed Mobility Overal bed mobility: Needs Assistance Bed Mobility: Supine to Sit;Sit to Supine     Supine to sit: Mod assist;+2 for physical assistance Sit to supine: Mod assist;+2 for physical assistance   General bed mobility comments: cues for sequence with vcs to initiate bil LE movement and use of HHA to elevate trunk, mod assist to scoot to EOB with pad  Transfers Overall transfer level: Needs assistance               General transfer comment: Stand from elevated bed mod assist of 2 with belt x2. Standing from 3n1 +2 max A (pt was not problem solving or listening to vcs to let go of 3n1 arms she attempted to stand and surface was lower than bed    Balance Overall balance assessment: Needs assistance Sitting-balance support: Bilateral upper extremity supported;Feet supported Sitting balance-Leahy Scale: Poor Sitting balance - Comments: reliant on UE support intermittently; pt overall min gaurd A     Standing balance support: Bilateral upper extremity supported;During functional activity Standing balance-Leahy Scale: Poor Standing balance comment: left lean                           ADL either performed or assessed with clinical judgement   ADL Overall ADL's : Needs assistance/impaired Eating/Feeding: NPO   Grooming: Minimal assistance (supported sitting)   Upper Body Bathing: Moderate assistance (supported sitting)   Lower Body Bathing: Total assistance Lower Body Bathing Details (indicate cue type and reason): +2 mod A sit<>stand from raised bed Upper Body Dressing : Total assistance Upper Body Dressing Details (indicate cue type and reason): supported sitting Lower Body Dressing: Total assistance Lower Body Dressing Details (indicate cue type and reason): +2 mod A sit<>stand from raised bed Toilet Transfer: Maximal assistance;+2 for physical assistance;Stand-pivot   Toileting- Clothing Manipulation and Hygiene: Total assistance Toileting - Clothing Manipulation Details (indicate cue type and reason): +2 max A sit<>stand from 3n1             Vision Patient Visual Report: No change from baseline              Pertinent Vitals/Pain Pain Assessment: No/denies pain     Hand Dominance Right   Extremity/Trunk Assessment Upper Extremity Assessment Upper Extremity Assessment: Generalized weakness (edema in both hands (Left more than right))           Communication Communication Communication: Expressive difficulties   Cognition Arousal/Alertness: Awake/alert Behavior During  Therapy: WFL for tasks assessed/performed (animated when a family member showed up) Overall Cognitive Status: History of cognitive impairments - at baseline                                                [place] expects to be discharged to:: Private residence Living Arrangements: Non-relatives/Friends Available Help at Discharge: Personal  care attendant;Available 24 hours/day Type of Home: House Home Access: Ramped entrance     Home Layout: One level     Bathroom Shower/Tub: Occupational psychologist: Standard     Home Equipment: Environmental consultant - 2 wheels;Cane - single point;Bedside commode;Shower seat;Wheelchair - manual;Grab bars - tub/shower          Prior Functioning/Environment Level of Independence: Needs assistance  Gait / Transfers Assistance Needed: used walker in home 15', WC for community, stood and transferred without assist ADL's / Homemaking Assistance Needed: assist for lower body bathing, sits to shower, dresses on her own after [name] lays out her clothes            OT Problem List: Decreased strength;Impaired balance (sitting and/or standing);Decreased safety awareness;Increased edema      OT Treatment/Interventions: Self-care/ADL training;Therapeutic activities;Patient/family education;DME and/or AE instruction;Balance training    OT Goals(Current goals can be found in the care plan section)    OT Frequency: Min 2X/week           Co-evaluation PT/OT/SLP Co-Evaluation/Treatment: Yes Reason for Co-Treatment: Complexity of the patient's impairments (multi-system involvement);To address functional/ADL transfers   OT goals addressed during session: Strengthening/ROM;ADL's and self-care      AM-PAC PT "6 Clicks" Daily Activity     Outcome Measure Help from another person eating meals?: Total Help from another person taking care of personal grooming?: A Lot Help from another person toileting, which includes using toliet, bedpan, or urinal?: Total Help from another person bathing (including washing, rinsing, drying)?: A Lot Help from another person to put on and taking off regular upper body clothing?: A Lot Help from another person to put on and taking off regular lower body clothing?: Total 6 Click Score: 9   End of Session Equipment Utilized During Treatment: Gait belt  Activity  Tolerance: Patient tolerated treatment well Patient left: in bed;with call bell/phone within reach;with family/visitor present  OT Visit Diagnosis: Unsteadiness on feet (R26.81);Muscle weakness (generalized) (M62.81)                Time: 4076-8088 OT Time Calculation (min): 29 min Charges:  OT General Charges $OT Visit: 1 Procedure OT Evaluation $OT Eval Moderate Complexity: 1 Procedure [place], OTR/L [phone] 12/18/2016

## 2016-12-18 NOTE — Progress Notes (Signed)
Physical Therapy Treatment Patient Details Name: Melissa Oconnell MRN: [id number] DOB: 22-Sep-1955 Today's Date: 12/18/2016    History of Present Illness 61 yo with h/o MS and breast CA admitted with bowel perforation s/p ostomy with VAC and significant weakness. VDRF 5/26-5/30; with arrest after aspiration 5/31 and reintubated, 2nd arrest 6/2 PEA    PT Comments    Pt with medical complications repeat arrests and return to vent since evaluation. Pt able to continue to participate in therapy, stand and pivot with 2 person assist. Pt continues to appropriate for therapy services and CIR with hopeful extubation soon. Pt encouraged to assist with mobility with nursing. Will continue to follow to progress function and improve mobility.  Pt on CPAP 30% with VSS throughout   Follow Up Recommendations  CIR;Supervision/Assistance - 24 hour     Equipment Recommendations       Recommendations for Other Services Rehab consult;Speech consult     Precautions / Restrictions Precautions Precautions: Fall Precaution Comments: VAC, jp drain; abdominal incision, vent Restrictions Weight Bearing Restrictions: No    Mobility  Bed Mobility Overal bed mobility: Needs Assistance Bed Mobility: Supine to Sit;Sit to Supine     Supine to sit: Mod assist;+2 for physical assistance Sit to supine: Mod assist;+2 for physical assistance   General bed mobility comments: cues for sequence with vcs to initiate bil LE movement and use of HHA to elevate trunk, mod assist to scoot to EOB with pad  Transfers Overall transfer level: Needs assistance   Transfers: Sit to/from Stand;Stand Pivot Transfers Sit to Stand: Mod assist;Max assist Stand pivot transfers: Max assist;+2 physical assistance       General transfer comment: pt stood from elevated bed x 2 with assist of belt, pad and elevated surface, pivoted BSC<>bed with max assist to pivot and control pelvis with cues for sequence. Max assist to stand from  [facility] after 3 attempts. Multimodal cues for all  Ambulation/Gait                 Stairs            Wheelchair Mobility    Modified Rankin (Stroke Patients Only)       Balance Overall balance assessment: Needs assistance Sitting-balance support: Bilateral upper extremity supported;Feet supported Sitting balance-Leahy Scale: Poor Sitting balance - Comments: reliant on UE support intermittently; pt overall min gaurd A    Standing balance support: Bilateral upper extremity supported;During functional activity Standing balance-Leahy Scale: Poor Standing balance comment: left lean                            Cognition Arousal/Alertness: Awake/alert Behavior During Therapy: WFL for tasks assessed/performed Overall Cognitive Status: History of cognitive impairments - at baseline                                 General Comments: pt following commands and nodding appropriately, remains disoriented to time      Exercises      General Comments        Pertinent Vitals/Pain Pain Assessment: No/denies pain    Home Living Family/patient expects to be discharged to:: Private residence Living Arrangements: Non-relatives/Friends Available Help at Discharge: Personal care attendant;Available 24 hours/day Type of Home: House Home Access: Ramped entrance   Home Layout: One level Home Equipment: Walker - 2 wheels;Cane - single point;Bedside commode;Shower seat;Wheelchair - manual;Grab bars -  tub/shower      Prior Function Level of Independence: Needs assistance  Gait / Transfers Assistance Needed: used walker in home 15', WC for community, stood and transferred without assist ADL's / Homemaking Assistance Needed: assist for lower body bathing, sits to shower, dresses on her own after [name] lays out her clothes     PT Goals (current goals can now be found in the care plan section) Progress towards PT goals: Progressing toward goals     Frequency           PT Plan Current plan remains appropriate    Co-evaluation PT/OT/SLP Co-Evaluation/Treatment: Yes Reason for Co-Treatment: Complexity of the patient's impairments (multi-system involvement);For patient/therapist safety PT goals addressed during session: Mobility/safety with mobility;Balance OT goals addressed during session: Strengthening/ROM;ADL's and self-care      AM-PAC PT "6 Clicks" Daily Activity  Outcome Measure  Difficulty turning over in bed (including adjusting bedclothes, sheets and blankets)?: A Lot Difficulty moving from lying on back to sitting on the side of the bed? : Total Difficulty sitting down on and standing up from a chair with arms (e.g., wheelchair, bedside commode, etc,.)?: Total Help needed moving to and from a bed to chair (including a wheelchair)?: Total Help needed walking in hospital room?: Total Help needed climbing 3-5 steps with a railing? : Total 6 Click Score: 7    End of Session Equipment Utilized During Treatment: Gait belt Activity Tolerance: Patient tolerated treatment well Patient left: in bed;with call bell/phone within reach Nurse Communication: Mobility status;Precautions PT Visit Diagnosis: Unsteadiness on feet (R26.81);Muscle weakness (generalized) (M62.81);Other abnormalities of gait and mobility (R26.89);Difficulty in walking, not elsewhere classified (R26.2)     Time: 8280-0349 PT Time Calculation (min) (ACUTE ONLY): 29 min  Charges:  $Therapeutic Activity: 8-22 mins                    G Codes:       Elwyn Reach, PT [phone]    [place] [facility] Anagabriela Jokerst 12/18/2016, 2:25 PM

## 2016-12-18 NOTE — Progress Notes (Signed)
[facility] Progress Note 8 Days Post-Op  Subjective: Patient doing okay.  Awake and alert on the ventilator.  Following commands  Objective: Vital signs in last 24 hours: Temp:  [97.7 F (36.5 C)-98.7 F (37.1 C)] 98.5 F (36.9 C) (06/04 0410) Pulse Rate:  [48-86] 74 (06/04 0700) Resp:  [12-26] 20 (06/04 0700) BP: (97-127)/(60-84) 112/70 (06/04 0700) SpO2:  [99 %-100 %] 100 % (06/04 0700) FiO2 (%):  [30 %-40 %] 30 % (06/04 0416) Weight:  [64.9 kg (143 lb 1.3 oz)] 64.9 kg (143 lb 1.3 oz) (06/04 0445) Last BM Date: 12/17/16  Intake/Output from previous day: 06/03 0701 - 06/04 0700 In: 3548 [I.V.:1870; NG/GT:1300; IV Piggyback:358] Out: 1905 [Urine:1275; Drains:30; Stool:600] Intake/Output this shift: Total I/O In: -  Out: 100 [Urine:100]  General: N oacute distress, not having pain when questioned.  Lungs: Clear.  Oxygen saturations 100% on FIO2 30%.  CXR not done yet today.  Abd: Soft, stoma is functional.  Purulent drainage from the right Blake drain.  Will culture this.  Extremities: No changes.  On heparin drip for PE.  May want to get peripheral LE doppler study at some point to see if there is an identifiable source o the PE.  Neuro: Intact  Lab Results:  @LABLAST2 (wbc:2,hgb:2,hct:2,plt:2) BMET ) Recent Labs  12/17/16 0332 12/18/16 0500  NA 147* 147*  K 3.2* 3.4*  CL 116* 117*  CO2 24 26  GLUCOSE 144* 155*  BUN 34* 29*  CREATININE 0.75 0.69  CALCIUM 7.4* 7.6*   PT/INR  Recent Labs  12/16/16 1625  LABPROT 17.8*  INR 1.45   ABG  Recent Labs  12/16/16 0649 12/17/16 0440  PHART 7.453* 7.471*  HCO3 19.9* 23.6    Studies/Results: Ct Angio Chest Pe W Or Wo Contrast  Result Date: 12/16/2016 CLINICAL DATA:  Cardiac arrest. EXAM: CT ANGIOGRAPHY CHEST WITH CONTRAST TECHNIQUE: Multidetector CT imaging of the chest was performed using the standard protocol during bolus administration of intravenous contrast. Multiplanar CT image reconstructions and  MIPs were obtained to evaluate the vascular anatomy. COMPARISON:  None. FINDINGS: Cardiovascular: Heart size mildly increased. No substantial pericardial effusion. No thoracic aortic aneurysm. No evidence for dissection of the thoracic aorta. No large central pulmonary embolus. No evidence for pulmonary embolus to the right lung (see image 155 series 15). Right IJ central line tip positioned mid SVC. Mediastinum/Nodes: Endotracheal and NG tubes are evident. No mediastinal lymphadenopathy. All soft tissue attenuation in the left hilum likely related to consolidative change in the central lung. There is no axillary lymphadenopathy. Lungs/Pleura: Left mainstem bronchus is occluded. Dense airspace consolidation identified posterior left upper lobe and diffusely in the left lower lobe. There is right lower lobe collapse/consolidation. Centrilobular and paraseptal emphysema noted bilaterally. Upper Abdomen: Unremarkable. Musculoskeletal: Diffuse body wall edema evident. Bone windows reveal no worrisome lytic or sclerotic osseous lesions. Review of the MIP images confirms the above findings. IMPRESSION: 1. Trace nonobstructive pulmonary embolus to a segmental branch of the left lower lobe. 2. Dense airspace consolidation posterior left upper lobe and diffusely in the left lower lobe with occluded left mainstem bronchus. 3. Small bilateral pleural effusions. Critical Value/emergent results were called by me at the time of interpretation on 12/16/2016 at 1:13 pm to [facility] , who verbally acknowledged these results. Electronically Signed   By: Misty Stanley M.D.   On: 12/16/2016 13:14   Dg Chest Port 1 View  Result Date: 12/17/2016 CLINICAL DATA:  Respiratory failure.  Ventilator dependence. EXAM: PORTABLE CHEST  1 VIEW COMPARISON:  12/15/2016 FINDINGS: Endotracheal tube tip is approximately 3 cm above the base of the carina. The NG tube passes into the stomach although the distal tip position is not included on the  film. Right IJ central line tip overlies proximal SVC level. Cardiopericardial silhouette is at upper limits of normal for size. Left base collapse/ consolidation similar to prior. Interval slight improvement in aeration at the right lung base. Probable tiny bilateral pleural effusions. Telemetry leads overlie the chest. Deferred later pads overlie the left chest. IMPRESSION: Slight interval improvement in right base aeration. Otherwise largely stable exam. Electronically Signed   By: Misty Stanley M.D.   On: 12/17/2016 07:42    Anti-infectives: Anti-infectives    Start     Dose/Rate Route Frequency Ordered Stop   12/17/16 0330  vancomycin (VANCOCIN) 500 mg in sodium chloride 0.9 % 100 mL IVPB     500 mg 100 mL/hr over 60 Minutes Intravenous Every 12 hours 12/16/16 1439     12/16/16 2100  piperacillin-tazobactam (ZOSYN) IVPB 3.375 g     3.375 g 12.5 mL/hr over 240 Minutes Intravenous Every 8 hours 12/16/16 1439     12/16/16 1530  vancomycin (VANCOCIN) 1,250 mg in sodium chloride 0.9 % 250 mL IVPB     1,250 mg 166.7 mL/hr over 90 Minutes Intravenous  Once 12/16/16 1439 12/16/16 1755   12/16/16 1500  piperacillin-tazobactam (ZOSYN) IVPB 3.375 g     3.375 g 100 mL/hr over 30 Minutes Intravenous  Once 12/16/16 1439 12/16/16 1559   12/12/16 1430  Ampicillin-Sulbactam (UNASYN) 3 g in sodium chloride 0.9 % 100 mL IVPB  Status:  Discontinued     3 g 200 mL/hr over 30 Minutes Intravenous Every 6 hours 12/12/16 1402 12/16/16 1415   12/11/16 2230  vancomycin (VANCOCIN) 500 mg in sodium chloride 0.9 % 100 mL IVPB  Status:  Discontinued     500 mg 100 mL/hr over 60 Minutes Intravenous Every 12 hours 12/11/16 1118 12/12/16 0956   12/11/16 1030  vancomycin (VANCOCIN) 500 mg in sodium chloride 0.9 % 100 mL IVPB  Status:  Discontinued     500 mg 100 mL/hr over 60 Minutes Intravenous Every 12 hours 12/11/16 0944 12/11/16 1115   12/10/16 1700  vancomycin (VANCOCIN) IVPB 750 mg/150 ml premix  Status:   Discontinued     750 mg 150 mL/hr over 60 Minutes Intravenous Every 12 hours 12/09/16 1532 12/10/16 1025   12/10/16 1700  vancomycin (VANCOCIN) IVPB 750 mg/150 ml premix  Status:  Discontinued     750 mg 150 mL/hr over 60 Minutes Intravenous Every 24 hours 12/10/16 1025 12/11/16 0944   12/10/16 0000  piperacillin-tazobactam (ZOSYN) IVPB 3.375 g  Status:  Discontinued     3.375 g 12.5 mL/hr over 240 Minutes Intravenous Every 8 hours 12/09/16 1532 12/12/16 1402   12/09/16 1530  piperacillin-tazobactam (ZOSYN) IVPB 3.375 g     3.375 g 100 mL/hr over 30 Minutes Intravenous  Once 12/09/16 1520 12/09/16 1659   12/09/16 1530  vancomycin (VANCOCIN) IVPB 1000 mg/200 mL premix     1,000 mg 200 mL/hr over 60 Minutes Intravenous  Once 12/09/16 1520 12/09/16 1801      Assessment/Plan: s/p Procedure(s): EXPLORATORY LAPAROTOMY WITH SIGMOID COLECTOMY ANT HARTMAN'S POUCH No contraindation to extubation from surgical standpoint.    LOS: 9 days   Kathryne Eriksson. Dahlia Bailiff, MD, FACS [phone] [facility] 12/18/2016

## 2016-12-18 NOTE — Progress Notes (Signed)
Progress Note  Patient Name: Melissa Oconnell Date of Encounter: 12/18/2016  Primary Cardiologist:   New (Dr. Sallyanne Kuster)  Subjective   Extubated.  No chest pain.  No SOB.    Inpatient Medications    Scheduled Meds: . Chlorhexidine Gluconate Cloth  6 each Topical Once  . Chlorhexidine Gluconate Cloth  6 each Topical Daily  . free water  200 mL Per Tube Q3H  . insulin aspart  2-6 Units Subcutaneous Q4H  . pantoprazole (PROTONIX) IV  40 mg Intravenous Q24H  . sodium chloride flush  10-40 mL Intracatheter Q12H   Continuous Infusions: . feeding supplement (VITAL AF 1.2 CAL) Stopped (12/18/16 1000)  . heparin 1,000 Units/hr (12/18/16 0800)  . piperacillin-tazobactam (ZOSYN)  IV Stopped (12/18/16 1000)   PRN Meds: acetaminophen, fentaNYL (SUBLIMAZE) injection, ondansetron (ZOFRAN) IV, sodium chloride flush   Vital Signs    Vitals:   12/18/16 1000 12/18/16 1100 12/18/16 1122 12/18/16 1131  BP: 124/77 131/80 131/81   Pulse: 72 69 85   Resp: 17 15 18    Temp:    98.8 F (37.1 C)  TempSrc:    Oral  SpO2: 100% 100%    Weight:      Height:        Intake/Output Summary (Last 24 hours) at 12/18/16 1142 Last data filed at 12/18/16 1100  Gross per 24 hour  Intake             3795 ml  Output             1765 ml  Net             2030 ml   Filed Weights   12/16/16 0500 12/17/16 0444 12/18/16 0445  Weight: 135 lb 12.9 oz (61.6 kg) 135 lb 12.9 oz (61.6 kg) 143 lb 1.3 oz (64.9 kg)    Telemetry    NSR - Personally Reviewed  ECG    NA - Personally Reviewed  Physical Exam   GEN: No acute distress.   Neck: No  JVD Cardiac: RRR, no murmurs, rubs, or gallops.  Respiratory: Clear  to auscultation bilaterally. GI: Soft, nontender, non-distended  MS: No edema; No deformity. Neuro:  Nonfocal  Psych: Normal affect   Labs    Chemistry Recent Labs Lab 12/16/16 0630 12/17/16 0332 12/18/16 0500  NA 141 147* 147*  K 4.1 3.2* 3.4*  CL 110 116* 117*  CO2 20* 24 26    GLUCOSE 122* 144* 155*  BUN 33* 34* 29*  CREATININE 1.03* 0.75 0.69  CALCIUM 7.8* 7.4* 7.6*  PROT 4.8*  --   --   ALBUMIN 1.6*  --   --   AST 32  --   --   ALT 33  --   --   ALKPHOS 152*  --   --   BILITOT 0.3  --   --   GFRNONAA 58* >60 >60  GFRAA >60 >60 >60  ANIONGAP 11 7 4*     Hematology Recent Labs Lab 12/15/16 0900 12/16/16 0630 12/17/16 0332  WBC 21.5* 26.7* 21.9*  RBC 2.89* 2.65* 2.60*  HGB 8.9* 8.2* 7.9*  HCT 26.2* 24.1* 23.5*  MCV 90.7 90.9 90.4  MCH 30.8 30.9 30.4  MCHC 34.0 34.0 33.6  RDW 14.7 14.4 14.5  PLT 123* 132* 159    Cardiac EnzymesNo results for input(s): TROPONINI in the last 168 hours. No results for input(s): TROPIPOC in the last 168 hours.   BNPNo results for input(s): BNP, PROBNP  in the last 168 hours.   DDimer No results for input(s): DDIMER in the last 168 hours.   Radiology    Ct Angio Chest Pe W Or Wo Contrast  Result Date: 12/16/2016 CLINICAL DATA:  Cardiac arrest. EXAM: CT ANGIOGRAPHY CHEST WITH CONTRAST TECHNIQUE: Multidetector CT imaging of the chest was performed using the standard protocol during bolus administration of intravenous contrast. Multiplanar CT image reconstructions and MIPs were obtained to evaluate the vascular anatomy. COMPARISON:  None. FINDINGS: Cardiovascular: Heart size mildly increased. No substantial pericardial effusion. No thoracic aortic aneurysm. No evidence for dissection of the thoracic aorta. No large central pulmonary embolus. No evidence for pulmonary embolus to the right lung (see image 155 series 15). Right IJ central line tip positioned mid SVC. Mediastinum/Nodes: Endotracheal and NG tubes are evident. No mediastinal lymphadenopathy. All soft tissue attenuation in the left hilum likely related to consolidative change in the central lung. There is no axillary lymphadenopathy. Lungs/Pleura: Left mainstem bronchus is occluded. Dense airspace consolidation identified posterior left upper lobe and diffusely in  the left lower lobe. There is right lower lobe collapse/consolidation. Centrilobular and paraseptal emphysema noted bilaterally. Upper Abdomen: Unremarkable. Musculoskeletal: Diffuse body wall edema evident. Bone windows reveal no worrisome lytic or sclerotic osseous lesions. Review of the MIP images confirms the above findings. IMPRESSION: 1. Trace nonobstructive pulmonary embolus to a segmental branch of the left lower lobe. 2. Dense airspace consolidation posterior left upper lobe and diffusely in the left lower lobe with occluded left mainstem bronchus. 3. Small bilateral pleural effusions. Critical Value/emergent results were called by me at the time of interpretation on 12/16/2016 at 1:13 pm to [facility] , who verbally acknowledged these results. Electronically Signed   By: Misty Stanley M.D.   On: 12/16/2016 13:14   Dg Chest Port 1 View  Result Date: 12/17/2016 CLINICAL DATA:  Respiratory failure.  Ventilator dependence. EXAM: PORTABLE CHEST 1 VIEW COMPARISON:  12/15/2016 FINDINGS: Endotracheal tube tip is approximately 3 cm above the base of the carina. The NG tube passes into the stomach although the distal tip position is not included on the film. Right IJ central line tip overlies proximal SVC level. Cardiopericardial silhouette is at upper limits of normal for size. Left base collapse/ consolidation similar to prior. Interval slight improvement in aeration at the right lung base. Probable tiny bilateral pleural effusions. Telemetry leads overlie the chest. Deferred later pads overlie the left chest. IMPRESSION: Slight interval improvement in right base aeration. Otherwise largely stable exam. Electronically Signed   By: Misty Stanley M.D.   On: 12/17/2016 07:42    Cardiac Studies   ECHO:  Today prelim with small pericardial effusion.  There is a large pleural effusion on echo.   Patient Profile     61 y.o. female Melissa Oconnell a 61 y.o.femalewith a hx of multiple sclerosis and breast  cancer who presented 12/09/2016 with acute dysarthria and left-sided weakness,developed respiratory failure and hypotension secondary to septic shock in the setting of perforated sigmoid colon. She had transient severe bradycardia and PEA likely due to respiratory distress (severe lung consolidation and small superimposed PE).   Assessment & Plan    BRADYCARDIA/PEA ARREST:    Suspected secondary to a respiratory cause PE.  Two episodes.  Telemetry without significant arrhythmia.    PERICARDIAL EFFUSION:  This is small as above.  No further therapy.    RESPIRATORY FAILURE:    Aspiration pneumonia being managed per CCM.   She is now extubated.  She received one IV dose of Lasix.  Will follow and start PO meds when able.    PULMONARY EMBOLISM:    Small.  She remains on IV heparin.   Signed, Minus Breeding, MD  12/18/2016, 11:42 AM

## 2016-12-18 NOTE — Progress Notes (Signed)
ANTICOAGULATION CONSULT NOTE - Follow Up Consult  Pharmacy Consult for heparin Indication: pulmonary embolus  No Known Allergies  Patient Measurements: Height: 5\' 8"  (172.7 cm) Weight: 143 lb 1.3 oz (64.9 kg) IBW/kg (Calculated) : 63.9 Heparin Dosing Weight: 61 kg  Vital Signs: Temp: 98.5 F (36.9 C) (06/04 0410) Temp Source: Oral (06/04 0410) BP: 100/83 (06/04 0600) Pulse Rate: 68 (06/04 0600)  Labs:  Recent Labs  12/15/16 0900 12/16/16 0630 12/16/16 1418 12/16/16 1625 12/16/16 2004 12/17/16 0332 12/17/16 0333 12/18/16 0500  HGB 8.9* 8.2*  --   --   --  7.9*  --   --   HCT 26.2* 24.1*  --   --   --  23.5*  --   --   PLT 123* 132*  --   --   --  159  --   --   APTT  --   --  35  --   --   --   --   --   LABPROT  --   --   --  17.8*  --   --   --   --   INR  --   --   --  1.45  --   --   --   --   HEPARINUNFRC  --   --   --   --  0.48  --  0.55 0.60  CREATININE 0.88 1.03*  --   --   --  0.75  --  0.69    Estimated Creatinine Clearance: 75.4 mL/min (by C-G formula based on SCr of 0.69 mg/dL).   Medications:  Infusions:  . dextrose 5% lactated ringers 75 mL/hr at 12/17/16 2015  . feeding supplement (VITAL AF 1.2 CAL) 1,000 mL (12/17/16 1647)  . heparin 1,000 Units/hr (12/17/16 1646)  . methylPREDNISolone (SOLU-MEDROL) injection Stopped (12/17/16 1133)  . piperacillin-tazobactam (ZOSYN)  IV 3.375 g (12/18/16 0600)  . vancomycin Stopped (12/18/16 0530)    Assessment: 62 yo F admitted 12/09/2016 with perforated sigmoid colon s/p exlap, colostomy on 5/27. With two PEA arrests since then. Patient diagnosed with acute PE and pharmacy consulted to start heparin.  Heparin level 0.60 (therapeutic), no CBC this am. No current signs/symptoms of bleeding noted  Goal of Therapy:  Heparin level 0.3-0.7 units/ml Monitor platelets by anticoagulation protocol: Yes   Plan:  - Continue heparin 1000 units/hr - Monitor daily heparin level, CBC and signs/symptoms of  bleeding  Melburn Popper, PharmD Clinical Pharmacy Resident Pager: [phone] 12/18/16 7:09 AM

## 2016-12-18 NOTE — Progress Notes (Signed)
PULMONARY / CRITICAL CARE MEDICINE   Name: Melissa Oconnell MRN: [id number] DOB: 03/01/1956    ADMISSION DATE:  12/09/2016 CONSULTATION DATE:  12/09/2016   REFERRING MD:  Jeanell Sparrow  CHIEF COMPLAINT:  confusion  HISTORY OF PRESENT ILLNESS:   61 y/o with MS, had perforated sigmoid colon, to OR, VDRF. Septic shock.  SUBJECTIVE:  Weaned yesterday   VITAL SIGNS: BP 112/70   Pulse 62   Temp 98.8 F (37.1 C) (Oral)   Resp 16   Ht 5\' 8"  (1.727 m)   Wt 64.9 kg (143 lb 1.3 oz)   SpO2 100%   BMI 21.76 kg/m   HEMODYNAMICS:    VENTILATOR SETTINGS: Vent Mode: PSV;CPAP FiO2 (%):  [30 %-40 %] 30 % Set Rate:  [18 bmp] 18 bmp Vt Set:  [500 mL] 500 mL PEEP:  [5 cmH20] 5 cmH20 Pressure Support:  [5 cmH20-12 cmH20] 5 cmH20 Plateau Pressure:  [13 cmH20-16 cmH20] 13 cmH20  INTAKE / OUTPUT:  Intake/Output Summary (Last 24 hours) at 12/18/16 0903 Last data filed at 12/18/16 0810  Gross per 24 hour  Intake             3458 ml  Output             1805 ml  Net             1653 ml   PHYSICAL EXAMINATION:   General:  Resting comfortably in bed HENT: NCAT ETT in place PULM: rhonchi bilaterally, normal effort CV: RRR, no mgr GI: BS+, soft, nontender,dressing/ostomy intact MSK: normal bulk and tone, swelling left arm Neuro: awake, following commands, can raise hands/head off bed today    LABS:  BMET  Recent Labs Lab 12/16/16 0630 12/17/16 0332 12/18/16 0500  NA 141 147* 147*  K 4.1 3.2* 3.4*  CL 110 116* 117*  CO2 20* 24 26  BUN 33* 34* 29*  CREATININE 1.03* 0.75 0.69  GLUCOSE 122* 144* 155*    Electrolytes  Recent Labs Lab 12/16/16 0630 12/16/16 1150 12/16/16 1625 12/17/16 0332 12/18/16 0500  CALCIUM 7.8*  --   --  7.4* 7.6*  MG 2.3 2.2 2.2 2.0  --   PHOS 4.3 3.3 3.3 2.7  --    CBC  Recent Labs Lab 12/15/16 0900 12/16/16 0630 12/17/16 0332  WBC 21.5* 26.7* 21.9*  HGB 8.9* 8.2* 7.9*  HCT 26.2* 24.1* 23.5*  PLT 123* 132* 159    Coag's  Recent  Labs Lab 12/16/16 1418 12/16/16 1625  APTT 35  --   INR  --  1.45    Sepsis Markers  Recent Labs Lab 12/14/16 1917 12/14/16 2330  LATICACIDVEN 8.5* 2.9*    ABG  Recent Labs Lab 12/14/16 2048 12/16/16 0649 12/17/16 0440  PHART 7.322* 7.453* 7.471*  PCO2ART 29.7* 28.5* 32.8  PO2ART 91.0 45.0* 139*    Liver Enzymes  Recent Labs Lab 12/16/16 0630  AST 32  ALT 33  ALKPHOS 152*  BILITOT 0.3  ALBUMIN 1.6*    Cardiac Enzymes No results for input(s): TROPONINI, PROBNP in the last 168 hours.  Glucose  Recent Labs Lab 12/17/16 1150 12/17/16 1625 12/17/16 1955 12/18/16 0000 12/18/16 0408 12/18/16 0759  GLUCAP 113* 147* 155* 143* 133* 119*    Imaging No results found. 6/3 CXR images independently reviewed, improved aeration RLL  STUDIES:  5/26 ct head/neck angiogram> no intracranial stenosis 5/27 CT abdomen> free air, sigmoid colon swelling 5/28 echo: Left ventricle:  The cavity size was normal. Wall thickness  was normal. Systolic function was normal. The estimated ejection fraction was in the range of 50% to 55%. Features are consistent with a pseudonormal left ventricular filling pattern, with concomitant abnormal relaxation and increased filling pressure (grade 2 diastolic dysfunction).Right ventricle:  The cavity size was normal. Systolic function was mildly to moderately reduced.  CULTURES: 5/27 resp > klebsiella, GPC 5/26 blood > neg     ANTIBIOTICS: 5/26 vanc > 5/29 5/26 zosyn > 5/29 5/29 unasyn > 6/2  6/2 vanc > 6/4 6/2 Zosyn >   SIGNIFICANT EVENTS: 5/27 OR exlap, colostomy 5/31 Cardiac arrest post extubation 6/2 arrested in am. PEA. Then NSVT. Cards called.   LINES/TUBES: 5/26 ETT > 5/30 5/26 left subclavian CVL> 6/1 6/1 R IJ CVL >   DISCUSSION: 61 y/o female with MS baseline who had a perforated sigmoid colon leading to sepsis, encephalopathy, respiratory failure.  Aspiration event with cardiac arrest on 5/31, re-intubated,  bronched food removed.  Then bradycardia/PEA arrest on 6/2 early AM, possibly related to pneumonia/respiratory failure with hypoxemia (though on vent, unclear cause).  ASSESSMENT / PLAN:  PULMONARY A: Acute respiratory failure with hypoxemia > stronger Aspiration 5/31 PE> small HCAP/aspiration pneumonia 6/1 P:   Full vent support Heparin gtt VAP prevention Extubation today, if fails then will need tracheostomy  CARDIOVASCULAR A:  Septic shock > resolved Cardiac arrest x2 > initial aspiration, second episode due to PE+ PNA? Left arm swelling P:  Cont tele Continue heparin per pharmacy, would prefer to hold off on oral agents until we know if she will need trach or not Left arm ultrasound to evaluate for DVT  RENAL A:   AKI Hypernatremia Hypokalemia P:   Monitor BMET and UOP Replace electrolytes as needed Double free water  GASTROINTESTINAL A:   Sigmoid colon perforation S/p ostomy P:   Wound care per surgery  Continue tubefeeds  HEMATOLOGIC A:   Anemia > no clear sign of bleeding, suspect dilution P:  Transfuse if Hgb < 7gm/dL Monitor for bleeding  INFECTIOUS A:   Septic shock due to peritonitis > improved Klebsiella CAP HCAP P:   Stop vanc Continue zosyn for 7 days  ENDOCRINE A:   No acute issues P:   Monitor glucose  NEUROLOGIC A:   MS baseline > worse, has acute flare up P:   Cont pulse steroids per neurology  Sedation protocol for vent synchrony   FAMILY  - Updates: updated [name] (HCPOA) 6/4  Goals of  Care: discussed with [name] and patient and informed them that the likelihood of tracheostomy is very high and if needed may be permanent given her severe weakness and multiple medical problems.  They stated that they would want tracheostomy if she is re-intubated.    My cc time 35  minutes   Roselie Awkward, MD [place] [facility] Pager: [phone] Cell: [phone] After 3pm or if no response, call [phone]

## 2016-12-18 NOTE — Consult Note (Addendum)
[place] Nurse wound consult note Discussed plan of care with surgical PA.  Vac discontinued and moist gauze dressing applied to abd wound.  Wound is beefy red and flush with skin level, small amt blood-tinged drainage. Pt tolerated with minimal amt discomfort.  [place] Nurse ostomy consultnote Stoma type/location: Colostomy surgery was performed 5/27. Pouch is intact with good seal, mod amt brown liquid stool. Will begin teaching sessions when stable and out of ICU. Supplies at bedside for staff nurse use PRN if leakage occurs. Enrolled patient in [place] program: No Julien Girt MSN, [place], [place], [place], [place]

## 2016-12-18 NOTE — Procedures (Signed)
Extubation Procedure Note  Patient Details:   Name: Melissa Oconnell DOB: Sep 23, 1955 MRN: [id number]   Airway Documentation:     Evaluation  O2 sats: stable throughout Complications: No apparent complications Patient did tolerate procedure well. Bilateral Breath Sounds: Rhonchi   Yes   Patient extubated to 2L nasal cannula per MD order.  Positive cuff leak noted.  No evidence of stridor.  Patient able to speak post extubation.  Sats currently 99%.  Vitals are stable.  Incentive spirometry performed x5 with achieved goal of 500.  No apparent complications.   Alphia Moh N 12/18/2016, 11:22 AM

## 2016-12-18 NOTE — Progress Notes (Signed)
Cortrak Tube Team Note:  Consult received to place a Cortrak feeding tube.   A 10 F Cortrak tube was placed in the right nare and secured with a nasal bridle at 72 cm. Per the Cortrak monitor reading the tube tip is in the stomach.   No x-ray is required. RN may begin using tube.   If the tube becomes dislodged please keep the tube and contact the Cortrak team at [email or web address] (password [email or web address]) for replacement.  If after hours and replacement cannot be delayed, place a NG tube and confirm placement with an abdominal x-ray.    Koleen Distance MS, RD, LDN Pager #- [phone]

## 2016-12-18 NOTE — Progress Notes (Signed)
eLink Physician-Brief Progress Note Patient Name: Melissa Oconnell DOB: 07/05/1956 MRN: [id number]   Date of Service  12/18/2016  HPI/Events of Note  Rn called for rise RR Camera care, pt awake vitals okAy except rr 30 Obtain abg  eICU Interventions       Intervention Category Major Interventions: Respiratory failure - evaluation and management  Raylene Miyamoto. 12/18/2016, 11:39 PM

## 2016-12-18 NOTE — Progress Notes (Addendum)
Continuing to follow for possible IP Rehab admission .  Family currently not present.  I will reach out by phone to discuss the rehab venue options.    [place] Admissions Coordinator Cell [phone] Office [phone]

## 2016-12-19 ENCOUNTER — Inpatient Hospital Stay (HOSPITAL_COMMUNITY): Payer: Medicare Other

## 2016-12-19 DIAGNOSIS — M7989 Other specified soft tissue disorders: Secondary | ICD-10-CM

## 2016-12-19 DIAGNOSIS — J9621 Acute and chronic respiratory failure with hypoxia: Secondary | ICD-10-CM

## 2016-12-19 LAB — CBC WITH DIFFERENTIAL/PLATELET
Basophils Absolute: 0 10*3/uL (ref 0.0–0.1)
Basophils Relative: 0 %
Eosinophils Absolute: 0 10*3/uL (ref 0.0–0.7)
Eosinophils Relative: 0 %
HCT: 29.3 % — ABNORMAL LOW (ref 36.0–46.0)
HEMOGLOBIN: 9.5 g/dL — AB (ref 12.0–15.0)
LYMPHS ABS: 1.8 10*3/uL (ref 0.7–4.0)
LYMPHS PCT: 7 %
MCH: 30 pg (ref 26.0–34.0)
MCHC: 32.4 g/dL (ref 30.0–36.0)
MCV: 92.4 fL (ref 78.0–100.0)
MONOS PCT: 2 %
Monocytes Absolute: 0.4 10*3/uL (ref 0.1–1.0)
NEUTROS PCT: 91 %
Neutro Abs: 22.2 10*3/uL — ABNORMAL HIGH (ref 1.7–7.7)
Platelets: 290 10*3/uL (ref 150–400)
RBC: 3.17 MIL/uL — AB (ref 3.87–5.11)
RDW: 14.6 % (ref 11.5–15.5)
WBC: 24.4 10*3/uL — AB (ref 4.0–10.5)

## 2016-12-19 LAB — BLOOD GAS, ARTERIAL
Acid-Base Excess: 1.2 mmol/L (ref 0.0–2.0)
Bicarbonate: 24.8 mmol/L (ref 20.0–28.0)
DRAWN BY: 398991
FIO2: 100
MECHVT: 500 mL
O2 Saturation: 99 %
PATIENT TEMPERATURE: 98.1
PCO2 ART: 36.3 mmHg (ref 32.0–48.0)
PEEP/CPAP: 5 cmH2O
PO2 ART: 251 mmHg — AB (ref 83.0–108.0)
RATE: 16 resp/min
pH, Arterial: 7.448 (ref 7.350–7.450)

## 2016-12-19 LAB — BASIC METABOLIC PANEL
Anion gap: 7 (ref 5–15)
Anion gap: 5 (ref 5–15)
BUN: 25 mg/dL — AB (ref 6–20)
BUN: 20 mg/dL (ref 6–20)
CO2: 26 mmol/L (ref 22–32)
CO2: 26 mmol/L (ref 22–32)
CREATININE: 0.58 mg/dL (ref 0.44–1.00)
Calcium: 7.4 mg/dL — ABNORMAL LOW (ref 8.9–10.3)
Calcium: 7.2 mg/dL — ABNORMAL LOW (ref 8.9–10.3)
Chloride: 111 mmol/L (ref 101–111)
Chloride: 111 mmol/L (ref 101–111)
Creatinine, Ser: 0.61 mg/dL (ref 0.44–1.00)
GFR calc Af Amer: >60 mL/min (ref >60)
GFR calc non Af Amer: >60 mL/min (ref >60)
GLUCOSE: 106 mg/dL — AB (ref 65–99)
Glucose, Bld: 110 mg/dL — ABNORMAL HIGH (ref 65–99)
POTASSIUM: 2.8 mmol/L — AB (ref 3.5–5.1)
POTASSIUM: 2.9 mmol/L — AB (ref 3.5–5.1)
SODIUM: 144 mmol/L (ref 135–145)
Sodium: 142 mmol/L (ref 135–145)

## 2016-12-19 LAB — GLUCOSE, CAPILLARY
GLUCOSE-CAPILLARY: 98 mg/dL (ref 65–99)
GLUCOSE-CAPILLARY: 104 mg/dL — AB (ref 65–99)
Glucose-Capillary: 113 mg/dL — ABNORMAL HIGH (ref 65–99)
Glucose-Capillary: 107 mg/dL — ABNORMAL HIGH (ref 65–99)
Glucose-Capillary: 96 mg/dL (ref 65–99)

## 2016-12-19 LAB — POCT I-STAT 3, ART BLOOD GAS (G3+)
Acid-Base Excess: 2 mmol/L (ref 0.0–2.0)
BICARBONATE: 25.5 mmol/L (ref 20.0–28.0)
O2 SAT: 93 %
PCO2 ART: 34.1 mmHg (ref 32.0–48.0)
PO2 ART: 61 mmHg — AB (ref 83.0–108.0)
Patient temperature: 98.7
TCO2: 27 mmol/L (ref 0–100)
pH, Arterial: 7.482 — ABNORMAL HIGH (ref 7.350–7.450)

## 2016-12-19 LAB — HEPARIN LEVEL (UNFRACTIONATED)
HEPARIN UNFRACTIONATED: 0.52 [IU]/mL (ref 0.30–0.70)
Heparin Unfractionated: 0.19 IU/mL — ABNORMAL LOW (ref 0.30–0.70)

## 2016-12-19 MED ORDER — CHLORHEXIDINE GLUCONATE 0.12% ORAL RINSE (MEDLINE KIT)
15.0000 mL | Freq: Two times a day (BID) | OROMUCOSAL | Status: DC
  Administered 2016-12-19 – 2016-12-21 (×5): 15 mL via OROMUCOSAL

## 2016-12-19 MED ORDER — "THROMBI-PAD 3""X3"" EX PADS"
2.0000 | MEDICATED_PAD | Freq: Once | CUTANEOUS | Status: AC
  Administered 2016-12-19: 1 via TOPICAL
  Filled 2016-12-19: qty 2

## 2016-12-19 MED ORDER — ETOMIDATE 2 MG/ML IV SOLN
20.0000 mg | Freq: Once | INTRAVENOUS | Status: AC
  Administered 2016-12-19: 20 mg via INTRAVENOUS

## 2016-12-19 MED ORDER — FENTANYL CITRATE (PF) 100 MCG/2ML IJ SOLN
INTRAMUSCULAR | Status: AC
  Administered 2016-12-19: 100 ug
  Filled 2016-12-19: qty 2

## 2016-12-19 MED ORDER — SODIUM CHLORIDE 0.9 % IV SOLN
0.0000 ug/min | INTRAVENOUS | Status: DC
  Administered 2016-12-19: 8 ug/min via INTRAVENOUS
  Administered 2016-12-19: 20 ug/min via INTRAVENOUS
  Administered 2016-12-20: 15 ug/min via INTRAVENOUS
  Administered 2016-12-20: 10 ug/min via INTRAVENOUS
  Filled 2016-12-19 (×4): qty 1

## 2016-12-19 MED ORDER — SODIUM CHLORIDE 0.9 % IV BOLUS (SEPSIS)
1000.0000 mL | Freq: Once | INTRAVENOUS | Status: AC
  Administered 2016-12-19: 1000 mL via INTRAVENOUS

## 2016-12-19 MED ORDER — SODIUM CHLORIDE 0.9 % IV BOLUS (SEPSIS)
500.0000 mL | Freq: Once | INTRAVENOUS | Status: AC
  Administered 2016-12-19: 500 mL via INTRAVENOUS

## 2016-12-19 MED ORDER — MIDAZOLAM HCL 2 MG/2ML IJ SOLN: 1.0000 mg | INTRAMUSCULAR | Status: DC | PRN

## 2016-12-19 MED ORDER — SODIUM CHLORIDE 0.9 % IV SOLN
INTRAVENOUS | Status: DC
  Administered 2016-12-23: 17:00:00 via INTRAVENOUS

## 2016-12-19 MED ORDER — MIDAZOLAM HCL 2 MG/2ML IJ SOLN
1.0000 mg | INTRAMUSCULAR | Status: DC | PRN
  Administered 2016-12-19 (×2): 1 mg via INTRAVENOUS

## 2016-12-19 MED ORDER — CHLORHEXIDINE GLUCONATE 0.12% ORAL RINSE (MEDLINE KIT): 15.0000 mL | Freq: Two times a day (BID) | OROMUCOSAL | Status: DC

## 2016-12-19 MED ORDER — FENTANYL CITRATE (PF) 100 MCG/2ML IJ SOLN: 50.0000 ug | INTRAMUSCULAR | Status: DC | PRN

## 2016-12-19 MED ORDER — POTASSIUM CHLORIDE 20 MEQ/15ML (10%) PO SOLN
40.0000 meq | ORAL | Status: AC
  Administered 2016-12-19 (×2): 40 meq
  Filled 2016-12-19 (×2): qty 30

## 2016-12-19 MED ORDER — ORAL CARE MOUTH RINSE
15.0000 mL | OROMUCOSAL | Status: DC
  Administered 2016-12-19 – 2016-12-21 (×10): 15 mL via OROMUCOSAL

## 2016-12-19 MED ORDER — SODIUM CHLORIDE 0.9 % IV SOLN
0.0000 ug/kg/h | INTRAVENOUS | Status: DC
  Filled 2016-12-19: qty 2

## 2016-12-19 MED ORDER — ORAL CARE MOUTH RINSE
15.0000 mL | Freq: Four times a day (QID) | OROMUCOSAL | Status: DC
  Administered 2016-12-19 – 2016-12-20 (×4): 15 mL via OROMUCOSAL

## 2016-12-19 MED ORDER — ROCURONIUM BROMIDE 50 MG/5ML IV SOLN
70.0000 mg | Freq: Once | INTRAVENOUS | Status: AC
  Administered 2016-12-19: 70 mg via INTRAVENOUS

## 2016-12-19 MED ORDER — MIDAZOLAM HCL 2 MG/2ML IJ SOLN
INTRAMUSCULAR | Status: AC
  Administered 2016-12-19: 2 mg
  Filled 2016-12-19: qty 4

## 2016-12-19 MED ORDER — SODIUM CHLORIDE 0.9 % IV SOLN
0.0000 ug/kg/h | INTRAVENOUS | Status: DC
  Administered 2016-12-19: 0.4 ug/kg/h via INTRAVENOUS
  Filled 2016-12-19: qty 4

## 2016-12-19 MED ORDER — FENTANYL CITRATE (PF) 100 MCG/2ML IJ SOLN
50.0000 ug | INTRAMUSCULAR | Status: DC | PRN
  Administered 2016-12-21 (×2): 50 ug via INTRAVENOUS
  Filled 2016-12-19 (×2): qty 2

## 2016-12-19 MED ORDER — FAMOTIDINE IN NACL 20-0.9 MG/50ML-% IV SOLN
20.0000 mg | Freq: Two times a day (BID) | INTRAVENOUS | Status: DC
  Administered 2016-12-19 – 2016-12-22 (×7): 20 mg via INTRAVENOUS
  Filled 2016-12-19 (×7): qty 50

## 2016-12-19 NOTE — Progress Notes (Signed)
Pt. reintubated this am due to recurring respiratory distress.  Will continue to follow at a distance for potential need for CIR in the future.    [place] Admissions Coordinator Cell [phone] Office [phone]

## 2016-12-19 NOTE — Clinical Social Work Note (Signed)
Pt was reintubated yesterday. CSW continuing to follow for medical stability. Recommendation has been CIR however, CSW started SNF backup.  [place], [place]

## 2016-12-19 NOTE — Progress Notes (Signed)
Verbal order to hold Heparin gtt for 4 hours per Dr Lake Bells. Resume at 4pm if abd bleeding controlled

## 2016-12-19 NOTE — Progress Notes (Deleted)
[facility] ADULT ICU REPLACEMENT PROTOCOL FOR AM LAB REPLACEMENT ONLY  The patient does apply for the [facility] Adult ICU Electrolyte Replacment Protocol based on the criteria listed below:   1. Is GFR >/= 40 ml/min? Yes.    Patient's GFR today is >60 2. Is urine output >/= 0.5 ml/kg/hr for the last 6 hours? Yes.   Patient's UOP is 0.9 ml/kg/hr 3. Is BUN < 60 mg/dL? Yes.    Patient's BUN today is 20 4. Abnormal electrolyte(s): 2.9 5. Ordered repletion with: per protocol 6. If a panic level lab has been reported, has the CCM MD in charge been notified? Yes.  .   Physician:  Dr. Carver Fila, Philis Nettle 12/19/2016 11:50 PM

## 2016-12-19 NOTE — Progress Notes (Signed)
Progress Note  Patient Name: Melissa Oconnell Date of Encounter: 12/19/2016  Primary Cardiologist:   [facility] (Dr. Sallyanne Kuster)  Subjective   She had recurrent respiratory distress and required reintubation this AM.   Inpatient Medications    Scheduled Meds: . chlorhexidine gluconate (MEDLINE KIT)  15 mL Mouth Rinse BID  . Chlorhexidine Gluconate Cloth  6 each Topical Once  . Chlorhexidine Gluconate Cloth  6 each Topical Daily  . free water  200 mL Per Tube Q3H  . insulin aspart  2-6 Units Subcutaneous Q4H  . mouth rinse  15 mL Mouth Rinse QID  . pantoprazole (PROTONIX) IV  40 mg Intravenous Q24H  . sodium chloride flush  10-40 mL Intracatheter Q12H   Continuous Infusions: . dexmedetomidine (PRECEDEX) IV infusion 0.4 mcg/kg/hr (12/19/16 1000)  . famotidine (PEPCID) IV Stopped (12/19/16 0930)  . feeding supplement (VITAL AF 1.2 CAL) 1,000 mL (12/18/16 1259)  . heparin 1,000 Units/hr (12/18/16 1609)  . piperacillin-tazobactam (ZOSYN)  IV Stopped (12/19/16 0801)   PRN Meds: acetaminophen, fentaNYL (SUBLIMAZE) injection, fentaNYL (SUBLIMAZE) injection, midazolam, midazolam, ondansetron (ZOFRAN) IV, sodium chloride flush   Vital Signs    Vitals:   12/19/16 0845 12/19/16 1015 12/19/16 1100 12/19/16 1130  BP: 106/70 95/67 (!) 87/56 98/60  Pulse: 84 71 76 75  Resp: _0 Temp:      TempSrc:      SpO2: 100% 100% 100% 100%  Weight:      Height:        Intake/Output Summary (Last 24 hours) at 12/19/16 1134 Last data filed at 12/19/16 0900  Gross per 24 hour  Intake             1530 ml  Output             4510 ml  Net            -2980 ml   Filed Weights   12/17/16 0444 12/18/16 0445 12/19/16 0403  Weight: 135 lb 12.9 oz (61.6 kg) 143 lb 1.3 oz (64.9 kg) 136 lb 7.4 oz (61.9 kg)    Telemetry    NSR - Personally Reviewed  ECG    NA - Personally Reviewed  Physical Exam   GEN: Intubated and sedated Neck: No  JVD Cardiac:  RRR, no murmurs, rubs, or gallops.    Respiratory:    Decreased breath sounds bilaterally GI: Soft, nontender, non-distended, decreased bowel sounds, bleeding slightly from abd wound MS:  Moderate leg edema; No deformity. Neuro:   Intubated and sedated     Labs    Chemistry  Recent Labs Lab 12/16/16 0630 12/17/16 0332 12/18/16 0500 12/19/16 0441  NA 141 147* 147* 144  K 4.1 3.2* 3.4* 2.8*  CL 110 116* 117* 111  CO2 20* _1 GLUCOSE 122* 144* 155* 110*  BUN 33* 34* 29* 25*  CREATININE 1.03* 0.75 0.69 0.61  CALCIUM 7.8* 7.4* 7.6* 7.4*  PROT 4.8*  --   --   --   ALBUMIN 1.6*  --   --   --   AST 32  --   --   --   ALT 33  --   --   --   ALKPHOS 152*  --   --   --   BILITOT 0.3  --   --   --   GFRNONAA 58* >60 >60 >60  GFRAA >60 >60 >60 >60  ANIONGAP 11 7 4* 7  Hematology  Recent Labs Lab 12/16/16 0630 12/17/16 0332 12/19/16 0441  WBC 26.7* 21.9* 24.4*  RBC 2.65* 2.60* 3.17*  HGB 8.2* 7.9* 9.5*  HCT 24.1* 23.5* 29.3*  MCV 90.9 90.4 92.4  MCH 30.9 30.4 30.0  MCHC 34.0 33.6 32.4  RDW 14.4 14.5 14.6  PLT 132* 159 290    Cardiac EnzymesNo results for input(s): TROPONINI in the last 168 hours. No results for input(s): TROPIPOC in the last 168 hours.   BNPNo results for input(s): BNP, PROBNP in the last 168 hours.   DDimer No results for input(s): DDIMER in the last 168 hours.   Radiology    Portable Chest Xray  Result Date: 12/19/2016 CLINICAL DATA:  Hypoxia EXAM: PORTABLE CHEST 1 VIEW COMPARISON:  December 17, 2016 FINDINGS: Endotracheal tube tip is 1.8 cm above the carina. Feeding tube tip is below the diaphragm. Central catheter tip is in the superior vena cava. No pneumothorax. There is airspace consolidation throughout the right mid and lower lung zones with volume loss. There is asymmetric interstitial edema on the right. There is a left pleural effusion with left base atelectasis. Left lung is otherwise clear. Heart size is normal. Pulmonary vascularity is within normal limits. No evident  adenopathy. No bone lesions. IMPRESSION: Tube and catheter positions as described without pneumothorax. Consolidation throughout right mid and lower lung zones with asymmetric pulmonary edema on the right. Suspect widespread pneumonia on the right. There is left base atelectasis with left pleural effusion. Left lung otherwise clear. Cardiac silhouette stable and within normal limits. Electronically Signed   By: Lowella Grip III M.D.   On: 12/19/2016 08:41    Cardiac Studies   ECHO:  - Left ventricle: The cavity size was normal. There was mild   concentric hypertrophy. Systolic function was normal. The   estimated ejection fraction was in the range of 55% to 60%. Wall   motion was normal; there were no regional wall motion   abnormalities. - Aortic valve: Trileaflet; normal thickness leaflets. - Pulmonary arteries: Systolic pressure was within the normal   range. - Inferior vena cava: The vessel was normal in size. - Pericardium, extracardiac: A mild pericardial effusion was   identified posterior to the heart. There was a left pleural   effusion.  Patient Profile     61 y.o. female Melissa Oconnell a 61 y.o.femalewith a hx of multiple sclerosis and breast cancer who presented 12/09/2016 with acute dysarthria and left-sided weakness,developed respiratory failure and hypotension secondary to septic shock in the setting of perforated sigmoid colon. She had transient severe bradycardia and PEA likely due to respiratory distress (severe lung consolidation and small superimposed PE).   Assessment & Plan    BRADYCARDIA/PEA ARREST:    Suspected secondary to a respiratory cause PE.  Two episodes.  Telemetry without significant arrhythmia.   No change in therapy.  We will follow as needed.    PERICARDIAL EFFUSION:  This is small as above.  No further therapy.    RESPIRATORY FAILURE:    Aspiration pneumonia being managed per CCM.   Reintubated.  She was unable to clear thick secretions.   Management per CCM.   PULMONARY EMBOLISM:    Small.  She remains on IV heparin.     Signed, Minus Breeding, MD  12/19/2016, 11:34 AM

## 2016-12-19 NOTE — Progress Notes (Signed)
RT replaced and lowered ETT tube holder to the lowest point possible to keep the ETT safely secured and try to relieve pressure from the cortrak bridle.  RN in room.

## 2016-12-19 NOTE — Progress Notes (Signed)
*  PRELIMINARY RESULTS* Vascular Ultrasound left upper extremity venous duplex has been completed.  Preliminary findings: No evidence of deep vein thrombosis involving the visualized veins of the left upper extremity. Evidence suggests multiple segments of superficial vein thrombosis seen throughout the left upper extremitity.  Segment of left cephalic vein near distal subclavian appears thrombosed, segment of left cephalic at [facility] fossa appears thrombosed and left bsilic vein near forearm IV suggests thrombosis.  Myrtie Cruise Darian Ace 12/19/2016, 11:15 AM

## 2016-12-19 NOTE — Progress Notes (Addendum)
ANTICOAGULATION CONSULT NOTE - Follow Up Consult  Pharmacy Consult for heparin Indication: pulmonary embolus  No Known Allergies  Patient Measurements: Height: 5\' 8"  (172.7 cm) Weight: 136 lb 7.4 oz (61.9 kg) IBW/kg (Calculated) : 63.9 Heparin Dosing Weight: 61 kg  Vital Signs: Temp: 97.7 F (36.5 C) (06/05 0339) Temp Source: Oral (06/05 0339) BP: 92/62 (06/05 0600) Pulse Rate: 87 (06/05 0600)  Labs:  Recent Labs  12/16/16 1418 12/16/16 1625  12/17/16 0332 12/17/16 0333 12/18/16 0500 12/19/16 0439 12/19/16 0441  HGB  --   --   --  7.9*  --   --   --  9.5*  HCT  --   --   --  23.5*  --   --   --  29.3*  PLT  --   --   --  159  --   --   --  290  APTT 35  --   --   --   --   --   --   --   LABPROT  --  17.8*  --   --   --   --   --   --   INR  --  1.45  --   --   --   --   --   --   HEPARINUNFRC  --   --   < >  --  0.55 0.60 0.52  --   CREATININE  --   --   --  0.75  --  0.69  --  0.61  < > = values in this interval not displayed.  Estimated Creatinine Clearance: 73.1 mL/min (by C-G formula based on SCr of 0.61 mg/dL).   Medications:  Infusions:  . feeding supplement (VITAL AF 1.2 CAL) 1,000 mL (12/18/16 1259)  . heparin 1,000 Units/hr (12/18/16 1609)  . piperacillin-tazobactam (ZOSYN)  IV 3.375 g (12/19/16 0401)    Assessment: 61 yo F admitted 12/09/2016 with perforated sigmoid colon s/p exlap, colostomy on 5/27. With two PEA arrests since then. Patient diagnosed with acute PE and pharmacy consulted to start heparin. Extubated 6/4.  Heparin level remains therapeutic (0.52) on gtt at 1000 units/hr, Hgb up at 9.5, PLT 290. No issues noted.  Goal of Therapy:  Heparin level 0.3-0.7 units/ml Monitor platelets by anticoagulation protocol: Yes   Plan:  - Continue heparin 1000 units/hr - Monitor daily heparin level, CBC and signs/symptoms of bleeding - f/u when able to tolerate oral AC  Melburn Popper, PharmD Clinical Pharmacy Resident Pager:  [phone] 12/19/16 7:14 AM    Addendum: Pt no longer with wound vac. Now using wet to dry dressing changes. RN reports continued oozing from right mid abd wound. Left upper extremely [place] also completed that showed no evidence of deep vein thrombosis however multiple segments of superficial vein thromboses. Currently on heparin for PE. Per Dr. Lake Bells will hold heparin and redress wound. If bleeding stabilizes, okay to resume in 4 hours (~4 PM). RN to call pharmacy when gtt resumed.

## 2016-12-19 NOTE — Procedures (Signed)
Intubation Procedure Note Melissa Oconnell [id number] Sep 20, 1955  Procedure: Intubation Indications: Respiratory insufficiency  Procedure Details Consent: Risks of procedure as well as the alternatives and risks of each were explained to the (patient/caregiver).  Consent for procedure obtained. Time Out: Verified patient identification, verified procedure, site/side was marked, verified correct patient position, special equipment/implants available, medications/allergies/relevent history reviewed, required imaging and test results available.  Performed  Drugs Etomidate 42m IV, Versed 251m Rocuronium 707mV DL x 1 with MAC 3 blade Grade 1 view 8.0 ET tube passed through cords under direct visualization Placement confirmed with bilateral breath sounds, positive EtCO2 change and smoke in tube   Evaluation Hemodynamic Status: BP stable throughout; O2 sats: stable throughout Patient's Current Condition: stable Complications: No apparent complications Patient did tolerate procedure well. Chest X-ray ordered to verify placement.  CXR: pending.   Melissa Maffucci5/2018

## 2016-12-19 NOTE — Progress Notes (Signed)
noted a small pressure area rt nare, associated with bridle for cortrak, RT called to reposition ETT holder so it does not press on bridle.Marland Kitchen

## 2016-12-19 NOTE — Progress Notes (Addendum)
CCM notified of continued oozing right mid abd wound. Will try thrombi pad.

## 2016-12-19 NOTE — Progress Notes (Signed)
PULMONARY / CRITICAL CARE MEDICINE   Name: Melissa Oconnell MRN: [id number] DOB: Aug 09, 1955    ADMISSION DATE:  12/09/2016 CONSULTATION DATE:  12/09/2016   REFERRING MD:  Jeanell Sparrow  CHIEF COMPLAINT:  confusion  HISTORY OF PRESENT ILLNESS:   61 y/o with MS, had perforated sigmoid colon, to OR, VDRF. Septic shock.  SUBJECTIVE:  Extubated yesterday, back on BIPAP overnight for desaturation, tachypnea   VITAL SIGNS: BP (!) 87/56   Pulse 72   Temp 98.4 F (36.9 C) (Oral)   Resp 19   Ht 5\' 8"  (1.727 m)   Wt 61.9 kg (136 lb 7.4 oz)   SpO2 99%   BMI 20.75 kg/m   HEMODYNAMICS:    VENTILATOR SETTINGS: Vent Mode: PRVC FiO2 (%):  [40 %-100 %] 60 % Set Rate:  [15 bmp-16 bmp] 16 bmp Vt Set:  [500 mL] 500 mL PEEP:  [5 cmH20] 5 cmH20 Plateau Pressure:  [19 cmH20-24 cmH20] 19 cmH20  INTAKE / OUTPUT:  Intake/Output Summary (Last 24 hours) at 12/19/16 1242 Last data filed at 12/19/16 0900  Gross per 24 hour  Intake             1510 ml  Output             3460 ml  Net            -1950 ml   PHYSICAL EXAMINATION:   General:  Resting comfortably in bed HENT: NCAT ETT in place PULM: vent supported breathing, some crackles CV: RRR, no mgr GI: BS+, some oozing from surgical wound MSK: normal bulk and tone Neuro: sedated on vent     LABS:  BMET  Recent Labs Lab 12/17/16 0332 12/18/16 0500 12/19/16 0441  NA 147* 147* 144  K 3.2* 3.4* 2.8*  CL 116* 117* 111  CO2 24 26 26   BUN 34* 29* 25*  CREATININE 0.75 0.69 0.61  GLUCOSE 144* 155* 110*    Electrolytes  Recent Labs Lab 12/16/16 1150 12/16/16 1625 12/17/16 0332 12/18/16 0500 12/19/16 0441  CALCIUM  --   --  7.4* 7.6* 7.4*  MG 2.2 2.2 2.0  --   --   PHOS 3.3 3.3 2.7  --   --    CBC  Recent Labs Lab 12/16/16 0630 12/17/16 0332 12/19/16 0441  WBC 26.7* 21.9* 24.4*  HGB 8.2* 7.9* 9.5*  HCT 24.1* 23.5* 29.3*  PLT 132* 159 290    Coag's  Recent Labs Lab 12/16/16 1418 12/16/16 1625  APTT 35  --    INR  --  1.45    Sepsis Markers  Recent Labs Lab 12/14/16 1917 12/14/16 2330  LATICACIDVEN 8.5* 2.9*    ABG  Recent Labs Lab 12/18/16 2346 12/19/16 0751 12/19/16 0950  PHART 7.477* 7.482* 7.448  PCO2ART 35.3 34.1 36.3  PO2ART 51.0* 61.0* 251*    Liver Enzymes  Recent Labs Lab 12/16/16 0630  AST 32  ALT 33  ALKPHOS 152*  BILITOT 0.3  ALBUMIN 1.6*    Cardiac Enzymes No results for input(s): TROPONINI, PROBNP in the last 168 hours.  Glucose  Recent Labs Lab 12/18/16 1523 12/18/16 2002 12/18/16 2339 12/19/16 0338 12/19/16 0758 12/19/16 1142  GLUCAP 123* 88 111* 113* 98 104*    Imaging Portable Chest Xray  Result Date: 12/19/2016 CLINICAL DATA:  Hypoxia EXAM: PORTABLE CHEST 1 VIEW COMPARISON:  December 17, 2016 FINDINGS: Endotracheal tube tip is 1.8 cm above the carina. Feeding tube tip is below the diaphragm. Central catheter tip is  in the superior vena cava. No pneumothorax. There is airspace consolidation throughout the right mid and lower lung zones with volume loss. There is asymmetric interstitial edema on the right. There is a left pleural effusion with left base atelectasis. Left lung is otherwise clear. Heart size is normal. Pulmonary vascularity is within normal limits. No evident adenopathy. No bone lesions. IMPRESSION: Tube and catheter positions as described without pneumothorax. Consolidation throughout right mid and lower lung zones with asymmetric pulmonary edema on the right. Suspect widespread pneumonia on the right. There is left base atelectasis with left pleural effusion. Left lung otherwise clear. Cardiac silhouette stable and within normal limits. Electronically Signed   By: Lowella Grip III M.D.   On: 12/19/2016 08:41   6/3 CXR images independently reviewed, improved aeration RLL  STUDIES:  5/26 ct head/neck angiogram> no intracranial stenosis 5/27 CT abdomen> free air, sigmoid colon swelling 5/28 echo: Left ventricle:  The cavity size  was normal. Wall thickness was normal. Systolic function was normal. The estimated ejection fraction was in the range of 50% to 55%. Features are consistent with a pseudonormal left ventricular filling pattern, with concomitant abnormal relaxation and increased filling pressure (grade 2 diastolic dysfunction).Right ventricle:  The cavity size was normal. Systolic function was mildly to moderately reduced. 6/2 CT angiogram> small subsegmental PE, multi-lobar pneumonia 6/5 left arm ultrasound > no DVT but there are multiple segmnets of superficial vein thrombosis through the left upper extremity, left cephalic vein near subclavian vein appears thrombosed, portion of left cephalic near [facility] fossa is thrombosed  CULTURES: 5/27 resp > klebsiella, GPC 5/26 blood > neg   ANTIBIOTICS: 5/26 vanc > 5/29 5/26 zosyn > 5/29 5/29 unasyn > 6/2  6/2 vanc > 6/4 6/2 Zosyn >   SIGNIFICANT EVENTS: 5/27 OR exlap, colostomy 5/31 Cardiac arrest post extubation 6/1 started pulse dose solumedrol for Solumedrol flare 6/2 arrested in am. PEA. Then NSVT. Cards called.  Small PE and multilobar pneumonia noted on CT angiogram   LINES/TUBES: 5/26 ETT > 5/30 5/26 left subclavian CVL> 6/1 6/1 R IJ CVL >   DISCUSSION: 61 y/o female with MS baseline who had a perforated sigmoid colon leading to sepsis, encephalopathy, respiratory failure.  Aspiration event with cardiac arrest on 5/31, re-intubated, bronched food removed.  Then bradycardia/PEA arrest on 6/2 early AM, possibly related to pneumonia/respiratory failure with hypoxemia (though on vent, unclear cause).  ASSESSMENT / PLAN:  PULMONARY A: Acute respiratory failure with hypoxemia due to HCAP, weakness Aspiration 5/31 PE> small HCAP/aspiration pneumonia 6/1 P:   Full vent support Heparin gtt VAP prevention Needs tracheostomy this week  CARDIOVASCULAR A:  Septic shock > resolved Cardiac arrest x2 > initial aspiration, second episode due to PE+  PNA? Left arm swelling P:  Cont tele Continue heparin for PE  RENAL A:   AKI Hypernatremia Hypokalemia P:   Monitor BMET and UOP Replace electrolytes as needed Continue free water  GASTROINTESTINAL A:   Sigmoid colon perforation S/p ostomy P:   Wound care per surgery  Continue tubefeeds  HEMATOLOGIC A:   Anemia > minor bleeding at surgical wound P:  Hold heparin briefly 6/5 AM to allow bleeding to dry up, thrombi-pad briefly Transfuse if Hgb < 7gm/dL Monitor for bleeding  INFECTIOUS A:   Septic shock due to peritonitis > improved Klebsiella CAP HCAP P:   Stop vanc Continue zosyn for 7 days  ENDOCRINE A:   No acute issues P:   Monitor glucose  NEUROLOGIC A:  MS baseline > worse, has acute flare up; s/p pulse dose solumedrol P:   Sedation protocol for vent synchrony   FAMILY  - Updates: updated [name] (HCPOA) 6/4  Goals of  Care: discussed with [name] and patient and informed them that the likelihood of tracheostomy is very high and if needed may be permanent given her severe weakness and multiple medical problems.  They stated that they would want tracheostomy if she is re-intubated.    My cc time 35  minutes   Roselie Awkward, MD [facility] [facility] Pager: [phone] Cell: [phone] After 3pm or if no response, call [phone]

## 2016-12-19 NOTE — Progress Notes (Signed)
ANTICOAGULATION CONSULT NOTE - Follow Up Consult  Pharmacy Consult for Heparin  Indication: pulmonary embolus  No Known Allergies  Patient Measurements: Height: 5\' 8"  (172.7 cm) Weight: 136 lb 7.4 oz (61.9 kg) IBW/kg (Calculated) : 63.9  Vital Signs: Temp: 99.4 F (37.4 C) (06/05 1955) Temp Source: Oral (06/05 1955) BP: 100/62 (06/05 2200) Pulse Rate: 73 (06/05 2306)  Labs:  Recent Labs  12/17/16 0332  12/18/16 0500 12/19/16 0439 12/19/16 0441 12/19/16 2226  HGB 7.9*  --   --   --  9.5*  --   HCT 23.5*  --   --   --  29.3*  --   PLT 159  --   --   --  290  --   HEPARINUNFRC  --   < > 0.60 0.52  --  0.19*  CREATININE 0.75  --  0.69  --  0.61 0.58  < > = values in this interval not displayed.  Estimated Creatinine Clearance: 73.1 mL/min (by C-G formula based on SCr of 0.58 mg/dL).    Assessment: 61 y/o F on heparin drip for PE, heparin level sub-therapeutic x 1 after re-start due to oozing from wound, stable since heparin re-start per RN  Goal of Therapy:  Heparin level 0.3-0.7 units/ml Monitor platelets by anticoagulation protocol: Yes   Plan:  -Inc heparin to 1100 units/hr -0800 HL  Narda Bonds 12/19/2016,11:51 PM

## 2016-12-19 NOTE — Progress Notes (Signed)
[place] Progress Note Patient Name: Melissa Oconnell DOB: Oct 06, 1955 MRN: [id number]   Date of Service  12/19/2016  HPI/Events of Note  Had  Low k prior, repeat this am and cbc  eICU Interventions       Intervention Category Minor Interventions: Clinical assessment - ordering diagnostic tests;Communication with other healthcare providers and/or family;Electrolytes abnormality - evaluation and management  FEINSTEIN,DANIEL J. 12/19/2016, 4:35 AM

## 2016-12-19 NOTE — Progress Notes (Signed)
After consulting with Dr. Oletta Darter, the bridle holding her Cortrak was removed without dislodging the Cortrak.  Right nare was developing a reddened area and ETT holder was pushing bridle up causing right side of nose to have wrinkle.Cortrak was secured with NG holder after bridle removal.

## 2016-12-19 NOTE — Progress Notes (Signed)
[facility] Progress Note 9 Days Post-Op  Subjective: Patient sitting up in bed, looks comfortable, but on BiPAP and possibly to be reintubated soon.  Objective: Vital signs in last 24 hours: Temp:  [97.3 F (36.3 C)-98.8 F (37.1 C)] 97.7 F (36.5 C) (06/05 0339) Pulse Rate:  [62-93] 87 (06/05 0600) Resp:  [15-33] 26 (06/05 0600) BP: (86-131)/(53-92) 92/62 (06/05 0600) SpO2:  [76 %-100 %] 99 % (06/05 0600) FiO2 (%):  [30 %-40 %] 40 % (06/05 0015) Weight:  [61.9 kg (136 lb 7.4 oz)] 61.9 kg (136 lb 7.4 oz) (06/05 0403) Last BM Date: 12/18/16  Intake/Output from previous day: 06/04 0701 - 06/05 0700 In: 2265 [I.V.:515; NG/GT:1600; IV Piggyback:150] Out: 3557 [Urine:3620; Drains:155; Stool:750] Intake/Output this shift: No intake/output data recorded.  General: Does not appear to be in distress.  Able to communicate ans state that she is comfortable  Lungs: Clear in upper lung fields.  Oxygen saturations 100% on BiPAP Fio2 40%.  Repeat ABG done from right brachial artery.  Pending  Abd: soft, good bowel sounds.  Tube feedings being held, has Cortrak tube in place.  Colostomyis functioning well.  750cc output in the last 24 hours.  Extremities: No changes  Neuro: Intact  Lab Results:  @LABLAST2 (wbc:2,hgb:2,hct:2,plt:2) BMET ) Recent Labs  12/18/16 0500 12/19/16 0441  NA 147* 144  K 3.4* 2.8*  CL 117* 111  CO2 26 26  GLUCOSE 155* 110*  BUN 29* 25*  CREATININE 0.69 0.61  CALCIUM 7.6* 7.4*   PT/INR  Recent Labs  12/16/16 1625  LABPROT 17.8*  INR 1.45   ABG  Recent Labs  12/17/16 0440 12/18/16 2346  PHART 7.471* 7.477*  HCO3 23.6 26.2    Studies/Results: No results found.  Anti-infectives: Anti-infectives    Start     Dose/Rate Route Frequency Ordered Stop   12/17/16 0330  vancomycin (VANCOCIN) 500 mg in sodium chloride 0.9 % 100 mL IVPB  Status:  Discontinued     500 mg 100 mL/hr over 60 Minutes Intravenous Every 12 hours 12/16/16 1439 12/18/16  0908   12/16/16 2100  piperacillin-tazobactam (ZOSYN) IVPB 3.375 g     3.375 g 12.5 mL/hr over 240 Minutes Intravenous Every 8 hours 12/16/16 1439 12/22/16 2359   12/16/16 1530  vancomycin (VANCOCIN) 1,250 mg in sodium chloride 0.9 % 250 mL IVPB     1,250 mg 166.7 mL/hr over 90 Minutes Intravenous  Once 12/16/16 1439 12/16/16 1755   12/16/16 1500  piperacillin-tazobactam (ZOSYN) IVPB 3.375 g     3.375 g 100 mL/hr over 30 Minutes Intravenous  Once 12/16/16 1439 12/16/16 1559   12/12/16 1430  Ampicillin-Sulbactam (UNASYN) 3 g in sodium chloride 0.9 % 100 mL IVPB  Status:  Discontinued     3 g 200 mL/hr over 30 Minutes Intravenous Every 6 hours 12/12/16 1402 12/16/16 1415   12/11/16 2230  vancomycin (VANCOCIN) 500 mg in sodium chloride 0.9 % 100 mL IVPB  Status:  Discontinued     500 mg 100 mL/hr over 60 Minutes Intravenous Every 12 hours 12/11/16 1118 12/12/16 0956   12/11/16 1030  vancomycin (VANCOCIN) 500 mg in sodium chloride 0.9 % 100 mL IVPB  Status:  Discontinued     500 mg 100 mL/hr over 60 Minutes Intravenous Every 12 hours 12/11/16 0944 12/11/16 1115   12/10/16 1700  vancomycin (VANCOCIN) IVPB 750 mg/150 ml premix  Status:  Discontinued     750 mg 150 mL/hr over 60 Minutes Intravenous Every 12 hours 12/09/16 1532  12/10/16 1025   12/10/16 1700  vancomycin (VANCOCIN) IVPB 750 mg/150 ml premix  Status:  Discontinued     750 mg 150 mL/hr over 60 Minutes Intravenous Every 24 hours 12/10/16 1025 12/11/16 0944   12/10/16 0000  piperacillin-tazobactam (ZOSYN) IVPB 3.375 g  Status:  Discontinued     3.375 g 12.5 mL/hr over 240 Minutes Intravenous Every 8 hours 12/09/16 1532 12/12/16 1402   12/09/16 1530  piperacillin-tazobactam (ZOSYN) IVPB 3.375 g     3.375 g 100 mL/hr over 30 Minutes Intravenous  Once 12/09/16 1520 12/09/16 1659   12/09/16 1530  vancomycin (VANCOCIN) IVPB 1000 mg/200 mL premix     1,000 mg 200 mL/hr over 60 Minutes Intravenous  Once 12/09/16 1520 12/09/16 1801       Assessment/Plan: s/p Procedure(s): EXPLORATORY LAPAROTOMY WITH SIGMOID COLECTOMY ANT HARTMAN'S POUCH CPM  Repeat ABG a little bit better.  Will await the decision of CCM physician.  LOS: 10 days   Kathryne Eriksson. Dahlia Bailiff, MD, FACS [phone] [facility] 12/19/2016

## 2016-12-19 NOTE — Progress Notes (Signed)
[facility] ADULT ICU REPLACEMENT PROTOCOL FOR AM LAB REPLACEMENT ONLY  The patient does apply for the [facility] Adult ICU Electrolyte Replacment Protocol based on the criteria listed below:   1. Is GFR >/= 40 ml/min? Yes.    Patient's GFR today is >60 2. Is urine output >/= 0.5 ml/kg/hr for the last 6 hours? Yes.   Patient's UOP is  0.67 ml/kg/hr 3. Is BUN < 60 mg/dL? Yes.    Patient's BUN today is 25 4. Abnormal electrolyte(s): 2.8 5. Ordered repletion with: per protocol 6. If a panic level lab has been reported, has the CCM MD in charge been notified? Yes.  .   Physician:  Dr. Katrine Coho, Philis Nettle 12/19/2016 6:11 AM

## 2016-12-20 ENCOUNTER — Inpatient Hospital Stay (HOSPITAL_COMMUNITY): Payer: Medicare Other

## 2016-12-20 LAB — CBC WITH DIFFERENTIAL/PLATELET
BASOS ABS: 0 10*3/uL (ref 0.0–0.1)
Basophils Relative: 0 %
Eosinophils Absolute: 0.1 10*3/uL (ref 0.0–0.7)
Eosinophils Relative: 0 %
HEMATOCRIT: 23.7 % — AB (ref 36.0–46.0)
HEMOGLOBIN: 7.9 g/dL — AB (ref 12.0–15.0)
LYMPHS PCT: 9 %
Lymphs Abs: 1.6 10*3/uL (ref 0.7–4.0)
MCH: 31.1 pg (ref 26.0–34.0)
MCHC: 33.3 g/dL (ref 30.0–36.0)
MCV: 93.3 fL (ref 78.0–100.0)
MONO ABS: 0.3 10*3/uL (ref 0.1–1.0)
MONOS PCT: 1 %
NEUTROS ABS: 16.9 10*3/uL — AB (ref 1.7–7.7)
NEUTROS PCT: 90 %
Platelets: 305 10*3/uL (ref 150–400)
RBC: 2.54 MIL/uL — ABNORMAL LOW (ref 3.87–5.11)
RDW: 14.9 % (ref 11.5–15.5)
WBC: 18.8 10*3/uL — ABNORMAL HIGH (ref 4.0–10.5)

## 2016-12-20 LAB — BASIC METABOLIC PANEL
ANION GAP: 5 (ref 5–15)
BUN: 19 mg/dL (ref 6–20)
CO2: 26 mmol/L (ref 22–32)
Calcium: 7.3 mg/dL — ABNORMAL LOW (ref 8.9–10.3)
Chloride: 111 mmol/L (ref 101–111)
Creatinine, Ser: 0.53 mg/dL (ref 0.44–1.00)
GFR calc Af Amer: >60 mL/min (ref >60)
GFR calc non Af Amer: >60 mL/min (ref >60)
GLUCOSE: 107 mg/dL — AB (ref 65–99)
POTASSIUM: 3.3 mmol/L — AB (ref 3.5–5.1)
Sodium: 142 mmol/L (ref 135–145)

## 2016-12-20 LAB — HEPARIN LEVEL (UNFRACTIONATED): Heparin Unfractionated: 0.31 IU/mL (ref 0.30–0.70)

## 2016-12-20 LAB — GLUCOSE, CAPILLARY
GLUCOSE-CAPILLARY: 101 mg/dL — AB (ref 65–99)
GLUCOSE-CAPILLARY: 103 mg/dL — AB (ref 65–99)
Glucose-Capillary: 100 mg/dL — ABNORMAL HIGH (ref 65–99)
Glucose-Capillary: 105 mg/dL — ABNORMAL HIGH (ref 65–99)
Glucose-Capillary: 93 mg/dL (ref 65–99)
Glucose-Capillary: 94 mg/dL (ref 65–99)
Glucose-Capillary: 96 mg/dL (ref 65–99)

## 2016-12-20 LAB — HEMOGLOBIN AND HEMATOCRIT, BLOOD
HEMATOCRIT: 25 % — AB (ref 36.0–46.0)
HEMOGLOBIN: 8.2 g/dL — AB (ref 12.0–15.0)

## 2016-12-20 LAB — PROTIME-INR
INR: 1.33
PROTHROMBIN TIME: 16.6 s — AB (ref 11.4–15.2)

## 2016-12-20 LAB — CORTISOL: Cortisol, Plasma: 14.6 ug/dL

## 2016-12-20 MED ORDER — POTASSIUM CHLORIDE 20 MEQ/15ML (10%) PO SOLN
40.0000 meq | Freq: Once | ORAL | Status: AC
Start: 2016-12-20 — End: 2016-12-20
  Administered 2016-12-20: 40 meq via ORAL
  Filled 2016-12-20: qty 30

## 2016-12-20 MED ORDER — VECURONIUM BROMIDE 10 MG IV SOLR
10.0000 mg | Freq: Once | INTRAVENOUS | Status: DC
  Filled 2016-12-20: qty 10

## 2016-12-20 MED ORDER — MIDAZOLAM HCL 2 MG/2ML IJ SOLN
4.0000 mg | Freq: Once | INTRAMUSCULAR | Status: AC
  Administered 2016-12-21: 4 mg via INTRAVENOUS
  Filled 2016-12-20: qty 4

## 2016-12-20 MED ORDER — ETOMIDATE 2 MG/ML IV SOLN
40.0000 mg | Freq: Once | INTRAVENOUS | Status: AC
  Administered 2016-12-21: 20 mg via INTRAVENOUS

## 2016-12-20 MED ORDER — MAGNESIUM SULFATE 2 GM/50ML IV SOLN
2.0000 g | Freq: Once | INTRAVENOUS | Status: AC
  Administered 2016-12-20: 2 g via INTRAVENOUS
  Filled 2016-12-20: qty 50

## 2016-12-20 MED ORDER — POTASSIUM CHLORIDE 20 MEQ/15ML (10%) PO SOLN
40.0000 meq | Freq: Once | ORAL | Status: AC
  Administered 2016-12-20: 40 meq via ORAL
  Filled 2016-12-20: qty 30

## 2016-12-20 MED ORDER — PROPOFOL 500 MG/50ML IV EMUL: 5.0000 ug/kg/min | INTRAVENOUS | Status: DC

## 2016-12-20 MED ORDER — FENTANYL CITRATE (PF) 100 MCG/2ML IJ SOLN
200.0000 ug | Freq: Once | INTRAMUSCULAR | Status: AC
  Administered 2016-12-21: 200 ug via INTRAVENOUS
  Filled 2016-12-20: qty 4

## 2016-12-20 MED ORDER — POTASSIUM CHLORIDE 10 MEQ/50ML IV SOLN
10.0000 meq | INTRAVENOUS | Status: AC
  Administered 2016-12-20 (×2): 10 meq via INTRAVENOUS
  Filled 2016-12-20 (×2): qty 50

## 2016-12-20 NOTE — Progress Notes (Signed)
[facility] Progress Note 10 Days Post-Op  Subjective: Patient back on the ventilator.  No distress.  Objective: Vital signs in last 24 hours: Temp:  [98.1 F (36.7 C)-99.4 F (37.4 C)] 98.5 F (36.9 C) (06/06 0424) Pulse Rate:  [68-112] 68 (06/06 0722) Resp:  [0-22] 16 (06/06 0722) BP: (69-117)/(45-90) 91/55 (06/06 0722) SpO2:  [91 %-100 %] 100 % (06/06 0722) FiO2 (%):  [40 %-100 %] 40 % (06/06 0722) Weight:  [72.4 kg (159 lb 9.8 oz)] 72.4 kg (159 lb 9.8 oz) (06/06 0430) Last BM Date: 12/19/16  Intake/Output from previous day: 06/05 0701 - 06/06 0700 In: 3361.8 [I.V.:811.8; NG/GT:1900; IV Piggyback:650] Out: 2259 [Urine:1015; Drains:43; NFAOZ:3086] Intake/Output this shift: No intake/output data recorded.  General: Able to communicate with me.  Says that she is okay.  Lungs: Diminished breath sounds in the RLL.  CXR shows RLL and RML atelectasis and consolidation.  Abd: Soft, tolerating tube feedings well.   Colostomy is viable and functioning well.  Extremities: No changes  Neuro: Intact.  Weak  Lab Results:  @LABLAST2 (wbc:2,hgb:2,hct:2,plt:2) BMET ) Recent Labs  12/19/16 2226 12/20/16 0550  NA 142 142  K 2.9* 3.3*  CL 111 111  CO2 26 26  GLUCOSE 106* 107*  BUN 20 19  CREATININE 0.58 0.53  CALCIUM 7.2* 7.3*   PT/INR No results for input(s): LABPROT, INR in the last 72 hours. ABG  Recent Labs  12/19/16 0751 12/19/16 0950  PHART 7.482* 7.448  HCO3 25.5 24.8    Studies/Results: Dg Chest Port 1 View  Result Date: 12/20/2016 CLINICAL DATA:  Hypoxia EXAM: PORTABLE CHEST 1 VIEW COMPARISON:  December 19, 2016 FINDINGS: Endotracheal tube tip is 2.7 cm above the carina. Feeding tube tip is below the diaphragm. Central catheter tip is in the superior vena cava. No pneumothorax. There is persistent elevation of the right hemidiaphragm with right pleural effusion and consolidation in the right mid lower lung zones. There is a pleural effusion on the left with left  base consolidation, stable. There is less edema in the right upper lung zone compared to 1 day prior. No new opacity. Heart size and pulmonary vascularity are normal. No adenopathy. No bone lesions. IMPRESSION: Tube and catheter positions as described without pneumothorax. Persistent right pleural effusion with consolidation in the right mid and lower lung zones. Interval resolution of edema in the right upper lobe. Persistent patchy consolidation and pleural effusion on the left. No new opacity on the left. Stable cardiac silhouette. Electronically Signed   By: Lowella Grip III M.D.   On: 12/20/2016 07:07   Portable Chest Xray  Result Date: 12/19/2016 CLINICAL DATA:  Hypoxia EXAM: PORTABLE CHEST 1 VIEW COMPARISON:  December 17, 2016 FINDINGS: Endotracheal tube tip is 1.8 cm above the carina. Feeding tube tip is below the diaphragm. Central catheter tip is in the superior vena cava. No pneumothorax. There is airspace consolidation throughout the right mid and lower lung zones with volume loss. There is asymmetric interstitial edema on the right. There is a left pleural effusion with left base atelectasis. Left lung is otherwise clear. Heart size is normal. Pulmonary vascularity is within normal limits. No evident adenopathy. No bone lesions. IMPRESSION: Tube and catheter positions as described without pneumothorax. Consolidation throughout right mid and lower lung zones with asymmetric pulmonary edema on the right. Suspect widespread pneumonia on the right. There is left base atelectasis with left pleural effusion. Left lung otherwise clear. Cardiac silhouette stable and within normal limits. Electronically Signed   By:  Lowella Grip III M.D.   On: 12/19/2016 08:41    Anti-infectives: Anti-infectives    Start     Dose/Rate Route Frequency Ordered Stop   12/17/16 0330  vancomycin (VANCOCIN) 500 mg in sodium chloride 0.9 % 100 mL IVPB  Status:  Discontinued     500 mg 100 mL/hr over 60 Minutes  Intravenous Every 12 hours 12/16/16 1439 12/18/16 0908   12/16/16 2100  piperacillin-tazobactam (ZOSYN) IVPB 3.375 g     3.375 g 12.5 mL/hr over 240 Minutes Intravenous Every 8 hours 12/16/16 1439 12/22/16 2359   12/16/16 1530  vancomycin (VANCOCIN) 1,250 mg in sodium chloride 0.9 % 250 mL IVPB     1,250 mg 166.7 mL/hr over 90 Minutes Intravenous  Once 12/16/16 1439 12/16/16 1755   12/16/16 1500  piperacillin-tazobactam (ZOSYN) IVPB 3.375 g     3.375 g 100 mL/hr over 30 Minutes Intravenous  Once 12/16/16 1439 12/16/16 1559   12/12/16 1430  Ampicillin-Sulbactam (UNASYN) 3 g in sodium chloride 0.9 % 100 mL IVPB  Status:  Discontinued     3 g 200 mL/hr over 30 Minutes Intravenous Every 6 hours 12/12/16 1402 12/16/16 1415   12/11/16 2230  vancomycin (VANCOCIN) 500 mg in sodium chloride 0.9 % 100 mL IVPB  Status:  Discontinued     500 mg 100 mL/hr over 60 Minutes Intravenous Every 12 hours 12/11/16 1118 12/12/16 0956   12/11/16 1030  vancomycin (VANCOCIN) 500 mg in sodium chloride 0.9 % 100 mL IVPB  Status:  Discontinued     500 mg 100 mL/hr over 60 Minutes Intravenous Every 12 hours 12/11/16 0944 12/11/16 1115   12/10/16 1700  vancomycin (VANCOCIN) IVPB 750 mg/150 ml premix  Status:  Discontinued     750 mg 150 mL/hr over 60 Minutes Intravenous Every 12 hours 12/09/16 1532 12/10/16 1025   12/10/16 1700  vancomycin (VANCOCIN) IVPB 750 mg/150 ml premix  Status:  Discontinued     750 mg 150 mL/hr over 60 Minutes Intravenous Every 24 hours 12/10/16 1025 12/11/16 0944   12/10/16 0000  piperacillin-tazobactam (ZOSYN) IVPB 3.375 g  Status:  Discontinued     3.375 g 12.5 mL/hr over 240 Minutes Intravenous Every 8 hours 12/09/16 1532 12/12/16 1402   12/09/16 1530  piperacillin-tazobactam (ZOSYN) IVPB 3.375 g     3.375 g 100 mL/hr over 30 Minutes Intravenous  Once 12/09/16 1520 12/09/16 1659   12/09/16 1530  vancomycin (VANCOCIN) IVPB 1000 mg/200 mL premix     1,000 mg 200 mL/hr over 60 Minutes  Intravenous  Once 12/09/16 1520 12/09/16 1801      Assessment/Plan: s/p Procedure(s): EXPLORATORY LAPAROTOMY WITH SIGMOID COLECTOMY ANT HARTMAN'S POUCH No changes or concerns from surgical standpoint.  May need bronchoscopy and eventually tracheostomy.  LOS: 11 days   Kathryne Eriksson. Dahlia Bailiff, MD, FACS [phone] [facility] Surgery 12/20/2016

## 2016-12-20 NOTE — Progress Notes (Signed)
Occupational Therapy Treatment Patient Details Name: Melissa Oconnell MRN: [id number] DOB: 14-Feb-1956 Today's Date: 12/20/2016    History of present illness 61 yo with h/o MS and breast CA admitted with bowel perforation s/p ostomy with VAC and significant weakness. VDRF 5/26-5/30; with arrest after aspiration 5/31 and reintubated, 2nd arrest 6/2 PEA. extubated 6/4 and reintubated 6/5   OT comments  This 61 yo female admitted and underwent above willing to work on getting OOB to recliner. Pt weaker than last tx 2 days ago. Pt with decreased endurance for activity today. She will continue to benefit from acute OT with follow up on CIR. Vitals stable throughout session.  Follow Up Recommendations  CIR;Supervision/Assistance - 24 hour    Equipment Recommendations  Other (comment) (TBD at next venue)    Recommendations for Other Services Rehab consult    Precautions / Restrictions Precautions Precautions: Fall Precaution Comments: jp drain; abdominal incision, vent Restrictions Weight Bearing Restrictions: No       Mobility Bed Mobility Overal bed mobility: Needs Assistance Bed Mobility: Supine to Sit     Supine to sit: Max assist;+2 for physical assistance Sit to supine: Mod assist;+2 for physical assistance   General bed mobility comments: cues for sequence with vcs to initiate bil LE movement and A to elevate trunk, max assist to scoot to EOB with pad  Transfers Overall transfer level: Needs assistance   Transfers: Sit to/from Stand Sit to Stand: Max assist;+2 physical assistance (slightly raised bed) Stand pivot transfers: Max assist;+2 physical assistance with use of bari stedy       General transfer comment: Used bari stedy for transfer due to pt weaker than on treatment session 2 days ago. Heavy assist to bring hips and trunk up. Pt with forward flexed posture.    Balance Overall balance assessment: Needs assistance Sitting-balance support: Bilateral upper  extremity supported;Feet supported Sitting balance-Leahy Scale: Poor Sitting balance - Comments: reliant on UE support intermittently; pt overall min A  Postural control: Posterior lean;Left lateral lean Standing balance support: Bilateral upper extremity supported;During functional activity Standing balance-Leahy Scale: Poor Standing balance comment: hands on front bar of bari stedy, pt with left lean and needed A at hips to weight shift to right to get left bari platform under her                           ADL either performed or assessed with clinical judgement   ADL Overall ADL's : Needs assistance/impaired     Grooming: Wash/dry face;Set up;Supervision/safety Grooming Details (indicate cue type and reason): supported sitting                 Toilet Transfer: Maximal assistance;+2 for physical assistance Toilet Transfer Details (indicate cue type and reason): sit<>stand from slightly raised bed--then transferred via [place] Patient Visual Report: No change from baseline            Cognition Arousal/Alertness: Awake/alert Behavior During Therapy: WFL for tasks assessed/performed Overall Cognitive Status: History of cognitive impairments - at baseline                                 General Comments: pt following commands and nodding appropriately  Pertinent Vitals/ Pain       Pain Assessment: No/denies pain         Frequency  Min 2X/week        Progress Toward Goals  OT Goals(current goals can now be found in the care plan section)  Progress towards OT goals: Not progressing toward goals - comment (pt weaker today  (back on vent and Hgb low))         Co-evaluation    PT/OT/SLP Co-Evaluation/Treatment: Yes Reason for Co-Treatment: Complexity of the patient's impairments (multi-system involvement);For patient/therapist safety PT goals addressed during session:  Mobility/safety with mobility;Balance OT goals addressed during session: Strengthening/ROM;ADL's and self-care      AM-PAC PT "6 Clicks" Daily Activity     Outcome Measure   Help from another person eating meals?: Total Help from another person taking care of personal grooming?: A Lot Help from another person toileting, which includes using toliet, bedpan, or urinal?: Total Help from another person bathing (including washing, rinsing, drying)?: A Lot Help from another person to put on and taking off regular upper body clothing?: Total Help from another person to put on and taking off regular lower body clothing?: Total 6 Click Score: 8    End of Session Equipment Utilized During Treatment:  (bari stedy)  OT Visit Diagnosis: Unsteadiness on feet (R26.81);Muscle weakness (generalized) (M62.81)   Activity Tolerance Patient limited by fatigue   Patient Left in chair;with call bell/phone within reach   Nurse Communication  (RN in room A'ing with lines/tubes for transfer, will get pt back with maxi sky)        Time: 2505-3976 OT Time Calculation (min): 28 min  Charges: OT General Charges $OT Visit: 1 Procedure OT Treatments $Therapeutic Activity: 8-22 mins  [place], OTR/L [phone] 12/20/2016

## 2016-12-20 NOTE — Consult Note (Signed)
[facility] [place] Nurse ostomy follow-up consult note: Surgical team following for assessment and plan of care to abd wound. Stoma type/location: Colostomy surgery was performed 5/27. Stomal assessment/size: Stoma red and viable, slightly above skin level, 1 3/4 inches.  Peristomal assessment: Intact skin surrounding Output: Mod amt brown liquid stool in the pouch Ostomy pouching: 2pc.  Education provided: Applied 2 piece pouch and barrier ring to maintain seal. Pt is on vent in ICU and unable to watch procedure or ask questions.No family members present for teaching sessio, caregiver from home is at the bedside and watched pouch change demonstration and asked appropriate questions.Will continue teaching sessions when stable and out of ICU. Enrolled patient in [facility] program: No Julien Girt MSN, [place], [place], [place], [place]

## 2016-12-20 NOTE — Progress Notes (Signed)
Physical Therapy Treatment Patient Details Name: Melissa Oconnell MRN: [id number] DOB: 12-20-1955 Today's Date: 12/20/2016    History of Present Illness 61 yo with h/o MS and breast CA admitted with bowel perforation s/p ostomy with VAC and significant weakness. VDRF 5/26-5/30; with arrest after aspiration 5/31 and reintubated, 2nd arrest 6/2 PEA. extubated 6/4 and reintubated 6/5    PT Comments    Pt requiring more assist today due to fatigue/weakness. Was extubated and reintubated since last visit. DC plan may need to change based on respiratory issues.    Follow Up Recommendations  CIR;Supervision/Assistance - 24 hour     Equipment Recommendations  None recommended by PT    Recommendations for Other Services Rehab consult;Speech consult     Precautions / Restrictions Precautions Precautions: Fall Precaution Comments: jp drain; abdominal incision, vent Restrictions Weight Bearing Restrictions: No    Mobility  Bed Mobility Overal bed mobility: Needs Assistance Bed Mobility: Supine to Sit     Supine to sit: Max assist;+2 for physical assistance Sit to supine: Mod assist;+2 for physical assistance   General bed mobility comments: cues for sequence with vcs to initiate bil LE movement and A to elevate trunk, max assist to scoot to EOB with pad  Transfers Overall transfer level: Needs assistance   Transfers: Sit to/from Stand Sit to Stand: Max assist;+2 physical assistance (slightly raised bed) Stand pivot transfers: Max assist;+2 physical assistance       General transfer comment: Used bari stedy for transfer due to pt weaker than on treatment session 2 days ago. Heavy assist to bring hips and trunk up. Pt with forward flexed posture.  Ambulation/Gait                 Stairs            Wheelchair Mobility    Modified Rankin (Stroke Patients Only)       Balance Overall balance assessment: Needs assistance Sitting-balance support: Bilateral upper  extremity supported;Feet supported Sitting balance-Leahy Scale: Poor Sitting balance - Comments: reliant on UE support intermittently; pt overall min A  Postural control: Posterior lean;Left lateral lean Standing balance support: Bilateral upper extremity supported;During functional activity Standing balance-Leahy Scale: Poor Standing balance comment: hands on front bar of bari stedy, pt with left lean and needed A at hips to weight shift to right to get left bari platform under her                            Cognition Arousal/Alertness: Awake/alert Behavior During Therapy: WFL for tasks assessed/performed Overall Cognitive Status: History of cognitive impairments - at baseline                                 General Comments: pt following commands and nodding appropriately      Exercises      General Comments        Pertinent Vitals/Pain Pain Assessment: No/denies pain    Home Living                      Prior Function            PT Goals (current goals can now be found in the care plan section) Progress towards PT goals: Not progressing toward goals - comment (fatigue)    Frequency    Min 3X/week      PT  Plan Current plan remains appropriate    Co-evaluation PT/OT/SLP Co-Evaluation/Treatment: Yes Reason for Co-Treatment: Complexity of the patient's impairments (multi-system involvement);For patient/therapist safety PT goals addressed during session: Mobility/safety with mobility;Balance OT goals addressed during session: Strengthening/ROM;ADL's and self-care      AM-PAC PT "6 Clicks" Daily Activity  Outcome Measure  Difficulty turning over in bed (including adjusting bedclothes, sheets and blankets)?: Total Difficulty moving from lying on back to sitting on the side of the bed? : Total Difficulty sitting down on and standing up from a chair with arms (e.g., wheelchair, bedside commode, etc,.)?: Total Help needed moving  to and from a bed to chair (including a wheelchair)?: Total Help needed walking in hospital room?: Total Help needed climbing 3-5 steps with a railing? : Total 6 Click Score: 6    End of Session   Activity Tolerance: Patient limited by fatigue Patient left: with call bell/phone within reach;in chair Nurse Communication: Mobility status;Need for lift equipment PT Visit Diagnosis: Unsteadiness on feet (R26.81);Muscle weakness (generalized) (M62.81);Other abnormalities of gait and mobility (R26.89);Difficulty in walking, not elsewhere classified (R26.2)     Time: 9450-3888 PT Time Calculation (min) (ACUTE ONLY): 23 min  Charges:  $Therapeutic Activity: 8-22 mins                    G Codes:       [facility] PT [place] 12/20/2016, 11:09 AM

## 2016-12-20 NOTE — Progress Notes (Signed)
[facility] [place] Progress Note Patient Name: ANALINA FILLA DOB: 1956/05/02 MRN: [id number]   Date of Service  12/20/2016  HPI/Events of Note  Hb dropped from 9.5 to 7.9; some blood smear from rectum, but no other obvious evidence of bleeding. On heparin for PE.   eICU Interventions  Repeat Hb @ noon.      Intervention Category Intermediate Interventions: Diagnostic test evaluation  Laverle Hobby 12/20/2016, 6:21 AM

## 2016-12-20 NOTE — Procedures (Signed)
Assessed pleural space at bedside with ultrasound.  R lateral assessment.  Images below.  Will discuss thoracentesis with HCPOA.      Noe Gens, NP-C [facility] Pgr: [phone] or if no answer [phone] 12/20/2016, 2:42 PM  Baltazar Apo, MD, PhD 12/20/2016, 3:38 PM  Pulmonary and Critical Care [phone] or if no answer [phone]

## 2016-12-20 NOTE — Progress Notes (Signed)
ANTICOAGULATION CONSULT NOTE - Follow Up Consult  Pharmacy Consult for heparin Indication: pulmonary embolus  No Known Allergies  Patient Measurements: Height: 5\' 8"  (172.7 cm) Weight: 159 lb 9.8 oz (72.4 kg) IBW/kg (Calculated) : 63.9 Heparin Dosing Weight: 61 kg  Vital Signs: Temp: 99.4 F (37.4 C) (06/06 0810) Temp Source: Oral (06/06 0810) BP: 93/55 (06/06 0930) Pulse Rate: 77 (06/06 0930)  Labs:  Recent Labs  12/19/16 0439 12/19/16 0441 12/19/16 2226 12/20/16 0550 12/20/16 0800  HGB  --  9.5*  --  7.9*  --   HCT  --  29.3*  --  23.7*  --   PLT  --  290  --  305  --   HEPARINUNFRC 0.52  --  0.19*  --  0.31  CREATININE  --  0.61 0.58 0.53  --     Estimated Creatinine Clearance: 75.4 mL/min (by C-G formula based on SCr of 0.53 mg/dL).   Medications:  Infusions:  . sodium chloride    . dexmedetomidine (PRECEDEX) IV infusion Stopped (12/19/16 1230)  . famotidine (PEPCID) IV Stopped (12/19/16 2244)  . feeding supplement (VITAL AF 1.2 CAL) 1,000 mL (12/20/16 0900)  . heparin 1,100 Units/hr (12/20/16 0900)  . phenylephrine (NEO-SYNEPHRINE) Adult infusion 15 mcg/min (12/20/16 0900)  . piperacillin-tazobactam (ZOSYN)  IV Stopped (12/20/16 2025)    Assessment: 61 yo F admitted 12/09/2016 with perforated sigmoid colon s/p exlap, colostomy on 5/27. With two PEA arrests since then. Patient diagnosed with acute PE and pharmacy consulted to start heparin. Left upper extremely [place] also completed that showed no evidence of deep vein thrombosis however multiple segments of superficial vein thromboses. Extubated 6/4.   6/5 pt wound vac removed, transitioned to wet to dry dressing changes. Pt began oozing from right mid abd wound. Per Dr. [place] Bells, heparin held until bleeding stabilized and RN could redress wound. Heparin resumed 6/5 pm requiring rate adjustment. Heparin level now therapeutic (0.31) on gtt at 1100 units/hr, Hgb down to 7.9 and RN did report some blood from rectum.  Yesterday was 9.5 > but day before was also 7.9, PLT trending up and no other evidence of bleeding. Will follow very closely.  Goal of Therapy:  Heparin level 0.3-0.7 units/ml Monitor platelets by anticoagulation protocol: Yes   Plan:  - Continue heparin 1100 units/hr - Monitor daily heparin level, CBC and signs/symptoms of bleeding - f/u when able to tolerate oral AC  Melburn Popper, PharmD Clinical Pharmacy Resident Pager: [phone] 12/20/16 9:58 AM

## 2016-12-20 NOTE — Procedures (Signed)
Attempted R thoracentesis > pt sat up on side of bed.  Standard sterile fashion maintained.  Patient had difficult time remaining upright despite support.  Unable to get into pocket of fluid.  Procedure aborted.  Band aide applied to site.  PCXR pending.    Noe Gens, NP-C [facility] Pgr: [phone] or if no answer [phone] 12/20/2016, 3:46 PM  Baltazar Apo, MD, PhD 12/21/2016, 11:30 AM [facility] [phone] or if no answer [phone]

## 2016-12-20 NOTE — Progress Notes (Signed)
PULMONARY / CRITICAL CARE MEDICINE   Name: Melissa Oconnell MRN: [id number] DOB: 02-10-1956    ADMISSION DATE:  12/09/2016 CONSULTATION DATE:  12/09/2016   REFERRING MD:  Jeanell Sparrow  CHIEF COMPLAINT:  confusion  BRIEF SUMMARY:  61 y/o with MS, had perforated sigmoid colon, to OR, VDRF post-op and septic shock. Hospital course complicated by cardiac arrest x2 (aspiration, second with turning on 6/2).  Work up for cardiac arrest led to findings of PE.  She has failed extubation x2.    SUBJECTIVE:  Intubated, weaning on PSV 5/5.  Up to chair / very weak. Tmax 99.7, WBC 18.8 (down from 24).  Minimal bloody drainage from rectum, heparin held due to bleeding with VAC yesterday > restarted in the afternoon 6/5.    VITAL SIGNS: BP (!) 102/48   Pulse 84   Temp 99.7 F (37.6 C) (Oral)   Resp (!) 31   Ht 5\' 8"  (1.727 m)   Wt 159 lb 9.8 oz (72.4 kg)   SpO2 100%   BMI 24.27 kg/m   HEMODYNAMICS:    VENTILATOR SETTINGS: Vent Mode: CPAP;PSV FiO2 (%):  [40 %-60 %] 40 % Set Rate:  [16 bmp] 16 bmp Vt Set:  [500 mL] 500 mL PEEP:  [5 cmH20] 5 cmH20 Pressure Support:  [10 cmH20] 10 cmH20 Plateau Pressure:  [14 cmH20-18 cmH20] 18 cmH20  INTAKE / OUTPUT:  Intake/Output Summary (Last 24 hours) at 12/20/16 1345 Last data filed at 12/20/16 1300  Gross per 24 hour  Intake           2870.1 ml  Output             2324 ml  Net            546.1 ml   PHYSICAL EXAMINATION: General: thin adult female in NAD on vent, up to chair HEENT: MM pink/moist, ETT Neuro: Awake, alert, generalized weakness  CV: s1s2 rrr, no m/r/g PULM: even/non-labored, clear anterior, diminished RLL EU:MPNT, non-tender, bsx4 active  Extremities: warm/dry, trace BLE edema  Skin: no rashes or lesions   LABS:  BMET  Recent Labs Lab 12/19/16 0441 12/19/16 2226 12/20/16 0550  NA 144 142 142  K 2.8* 2.9* 3.3*  CL 111 111 111  CO2 26 26 26   BUN 25* 20 19  CREATININE 0.61 0.58 0.53  GLUCOSE 110* 106* 107*     Electrolytes  Recent Labs Lab 12/16/16 1150 12/16/16 1625 12/17/16 0332  12/19/16 0441 12/19/16 2226 12/20/16 0550  CALCIUM  --   --  7.4*  < > 7.4* 7.2* 7.3*  MG 2.2 2.2 2.0  --   --   --   --   PHOS 3.3 3.3 2.7  --   --   --   --   < > = values in this interval not displayed. CBC  Recent Labs Lab 12/17/16 0332 12/19/16 0441 12/20/16 0550 12/20/16 1131  WBC 21.9* 24.4* 18.8*  --   HGB 7.9* 9.5* 7.9* 8.2*  HCT 23.5* 29.3* 23.7* 25.0*  PLT 159 290 305  --     Coag's  Recent Labs Lab 12/16/16 1418 12/16/16 1625  APTT 35  --   INR  --  1.45    Sepsis Markers  Recent Labs Lab 12/14/16 1917 12/14/16 2330  LATICACIDVEN 8.5* 2.9*    ABG  Recent Labs Lab 12/18/16 2346 12/19/16 0751 12/19/16 0950  PHART 7.477* 7.482* 7.448  PCO2ART 35.3 34.1 36.3  PO2ART 51.0* 61.0* 251*  Liver Enzymes  Recent Labs Lab 12/16/16 0630  AST 32  ALT 33  ALKPHOS 152*  BILITOT 0.3  ALBUMIN 1.6*    Cardiac Enzymes No results for input(s): TROPONINI, PROBNP in the last 168 hours.  Glucose  Recent Labs Lab 12/19/16 1524 12/19/16 1953 12/20/16 0010 12/20/16 0415 12/20/16 0808 12/20/16 1134  GLUCAP 107* 96 94 96 101* 100*    Imaging Dg Chest Port 1 View  Result Date: 12/20/2016 CLINICAL DATA:  Hypoxia EXAM: PORTABLE CHEST 1 VIEW COMPARISON:  December 19, 2016 FINDINGS: Endotracheal tube tip is 2.7 cm above the carina. Feeding tube tip is below the diaphragm. Central catheter tip is in the superior vena cava. No pneumothorax. There is persistent elevation of the right hemidiaphragm with right pleural effusion and consolidation in the right mid lower lung zones. There is a pleural effusion on the left with left base consolidation, stable. There is less edema in the right upper lung zone compared to 1 day prior. No new opacity. Heart size and pulmonary vascularity are normal. No adenopathy. No bone lesions. IMPRESSION: Tube and catheter positions as described  without pneumothorax. Persistent right pleural effusion with consolidation in the right mid and lower lung zones. Interval resolution of edema in the right upper lobe. Persistent patchy consolidation and pleural effusion on the left. No new opacity on the left. Stable cardiac silhouette. Electronically Signed   By: Lowella Grip III M.D.   On: 12/20/2016 07:07   6/3 CXR images independently reviewed, improved aeration RLL  STUDIES:  5/26  CT head/neck angiogram >> no intracranial stenosis 5/27  CT abdomen >> free air, sigmoid colon swelling 5/28  ECHO >> Left ventricle:  The cavity size was normal. Wall thickness was normal. Systolic function was normal. The estimated ejection fraction was in the range of 50% to 55%. Features are consistent with a pseudonormal left ventricular filling pattern, with concomitant abnormal relaxation and increased filling pressure (grade 2 diastolic dysfunction).Right ventricle:  The cavity size was normal. Systolic function was mildly to moderately reduced. 6/02  CTA Chest >> small subsegmental PE, multi-lobar pneumonia 6/05  LUE ultrasound > no DVT but there are multiple segmnets of superficial vein thrombosis through the left upper extremity, left cephalic vein near subclavian vein appears thrombosed, portion of left cephalic near [facility] fossa is thrombosed  CULTURES: Resp 5/27 >> klebsiella, GPC BCx2 5/26 >> negative JP fluid drainage 6/4 >>   ANTIBIOTICS: Vanc 5/26 > 5/29 Zosyn 5/26 > 5/29 Unasyn 5/29 > 6/2  Vanc 6/2 > 6/4 Zosyn 6/2 >>  SIGNIFICANT EVENTS: 5/27  OR exlap, colostomy 5/31  Cardiac arrest post extubation 6/01  started pulse dose solumedrol for Solumedrol flare 6/02  arrested in am. PEA > NSVT. Cards called.  Small PE & multilobar pneumonia noted on CT angiogram   LINES/TUBES: ETT 5/26 > 5/30 ETT 5/30 >> 6/4 ETT 6/5 >> 5/26 left subclavian CVL> 6/1 6/1 R IJ CVL >>  DISCUSSION: 61 y/o female with MS baseline who had a perforated  sigmoid colon leading to sepsis, encephalopathy, respiratory failure.  Aspiration event with cardiac arrest on 5/31, re-intubated, bronched food removed.  Then bradycardia/PEA arrest on 6/2 early AM, possibly related to pneumonia/respiratory failure with hypoxemia (though on vent, unclear cause).  ASSESSMENT / PLAN:  PULMONARY A: Acute respiratory failure with hypoxemia due to HCAP, weakness Aspiration 5/31 PE - small HCAP/aspiration pneumonia 6/1 Right Pleural Effusion  P:   PRVC 8 cc/kg  Wean PEEP / FiO2 for sats > 925  Daily PSV as tolerated  Plan for trach later in week Assess pleural space for effusion Heparin gtt for PE VAP prevention measures Pending trach placement at 1pm 6/7 Intermittent CXR  CARDIOVASCULAR A:  Septic shock  Cardiac arrest x2 - initial aspiration, second episode due to PE+ PNA? Left arm swelling P:  ICU monitoring of tele Assess cortisol  Heparin per pharmacy, appreciate assistance  RENAL A:   AKI Hypernatremia Hypokalemia P:   Trend BMP / urinary output Replace electrolytes as indicated Avoid nephrotoxic agents, ensure adequate renal perfusion Free water 200 ml Q3 hours  GASTROINTESTINAL A:   Sigmoid colon perforation S/p Ostomy P:    Wound care per surgery  Continue TF Free water as above   HEMATOLOGIC A:   Anemia - minor bleeding at surgical wound, some from rectal stump P:  Heparin gtt per pharmacy  Transfuse per ICU guidelines  Monitor for bleeding  INFECTIOUS A:   Septic shock due to peritonitis > improved Klebsiella CAP HCAP P:   Continue zosyn for 7 days Monitor fever curve / WBC trend Monitor wounds   ENDOCRINE A:   No acute issues P:   Monitor glucose on BMP   NEUROLOGIC A:   MS baseline - worse, has acute flare up; s/p pulse dose solumedrol + critical illness P:   Continue PT efforts  Minimize sedation as able    FAMILY  - Updates: updated Barbara (HCPOA) 6/5 via phone.    CC Time: 61  minutes  Noe Gens, NP-C [facility] [facility] Pgr: [phone] or if no answer [phone] 12/20/2016, 2:02 PM  Attending Note:  I have examined patient, reviewed labs, studies and notes. I have discussed the case with B Ollis, and I agree with the data and plans as amended above. 61 year old woman with a history of multiple sclerosis admitted with perforated sigmoid colon requiring urgent surgery. She returned to the ICU intubated and with septic shock from peritonitis. Her subsequent course has been complicated by recurrent respiratory failure due to increased work of breathing, resp muscle weakness and cardiac arrest with a new dx pulmonary embolism. On evaluation today she is a thin debilitated woman, ventilated. Breath sounds on the left are clear, significantly decreased on the right. Abdominal wound clean and dry intact. She had a transient increase in hemoglobin 6/5, back to 7.9 on 6/6 and stable on repeat. Chest x-ray shows right basilar opacity, question right middle lobe and/or right lower lobe collapse. Discussed with her and with her healthcare power of attorney the indications for tracheostomy. I believe that she will require this in order to make overall weaning progress. We will work on arranging. We will also perform an ultrasound evaluation of her right hemithorax to evaluate for pleural effusion. She may require bronchoscopy . Independent critical care time is 35 minutes.   Baltazar Apo, MD, PhD 12/20/2016, 3:20 PM [facility] [phone] or if no answer [phone]

## 2016-12-20 NOTE — Progress Notes (Signed)
[place] Progress Note Patient Name: KALEEYA HANCOCK DOB: Mar 16, 1956 MRN: [id number]   Date of Service  12/20/2016  HPI/Events of Note  K=2.9  eICU Interventions  Ordered 40 mEq PO and 20 mEq IV.     Intervention Category Intermediate Interventions: Electrolyte abnormality - evaluation and management  Georgana Romain 12/20/2016, 12:03 AM

## 2016-12-21 ENCOUNTER — Inpatient Hospital Stay (HOSPITAL_COMMUNITY): Payer: Medicare Other

## 2016-12-21 DIAGNOSIS — K659 Peritonitis, unspecified: Secondary | ICD-10-CM

## 2016-12-21 LAB — BASIC METABOLIC PANEL
ANION GAP: 6 (ref 5–15)
BUN: 13 mg/dL (ref 6–20)
CHLORIDE: 109 mmol/L (ref 101–111)
CO2: 24 mmol/L (ref 22–32)
Calcium: 7.5 mg/dL — ABNORMAL LOW (ref 8.9–10.3)
Creatinine, Ser: 0.59 mg/dL (ref 0.44–1.00)
Glucose, Bld: 88 mg/dL (ref 65–99)
POTASSIUM: 3.1 mmol/L — AB (ref 3.5–5.1)
SODIUM: 139 mmol/L (ref 135–145)

## 2016-12-21 LAB — CBC
HEMATOCRIT: 24.8 % — AB (ref 36.0–46.0)
HEMOGLOBIN: 8 g/dL — AB (ref 12.0–15.0)
MCH: 30 pg (ref 26.0–34.0)
MCHC: 32.3 g/dL (ref 30.0–36.0)
MCV: 92.9 fL (ref 78.0–100.0)
Platelets: 332 10*3/uL (ref 150–400)
RBC: 2.67 MIL/uL — AB (ref 3.87–5.11)
RDW: 15.5 % (ref 11.5–15.5)
WBC: 14.7 10*3/uL — AB (ref 4.0–10.5)

## 2016-12-21 LAB — GLUCOSE, CAPILLARY
GLUCOSE-CAPILLARY: 83 mg/dL (ref 65–99)
Glucose-Capillary: 105 mg/dL — ABNORMAL HIGH (ref 65–99)
Glucose-Capillary: 111 mg/dL — ABNORMAL HIGH (ref 65–99)
Glucose-Capillary: 79 mg/dL (ref 65–99)
Glucose-Capillary: 86 mg/dL (ref 65–99)
Glucose-Capillary: 86 mg/dL (ref 65–99)

## 2016-12-21 LAB — AEROBIC CULTURE W GRAM STAIN (SUPERFICIAL SPECIMEN)

## 2016-12-21 LAB — PHOSPHORUS: PHOSPHORUS: 2.1 mg/dL — AB (ref 2.5–4.6)

## 2016-12-21 LAB — AEROBIC CULTURE  (SUPERFICIAL SPECIMEN)

## 2016-12-21 LAB — MAGNESIUM: MAGNESIUM: 1.8 mg/dL (ref 1.7–2.4)

## 2016-12-21 LAB — HEPARIN LEVEL (UNFRACTIONATED): HEPARIN UNFRACTIONATED: 0.33 [IU]/mL (ref 0.30–0.70)

## 2016-12-21 MED ORDER — CHLORHEXIDINE GLUCONATE 0.12% ORAL RINSE (MEDLINE KIT)
15.0000 mL | Freq: Two times a day (BID) | OROMUCOSAL | Status: DC
  Administered 2016-12-21 – 2016-12-26 (×10): 15 mL via OROMUCOSAL

## 2016-12-21 MED ORDER — POTASSIUM PHOSPHATES 15 MMOLE/5ML IV SOLN
10.0000 mmol | Freq: Once | INTRAVENOUS | Status: AC
  Administered 2016-12-21: 10 mmol via INTRAVENOUS
  Filled 2016-12-21: qty 3.33

## 2016-12-21 MED ORDER — DEXTROSE 5 % IV SOLN
0.0000 ug/min | INTRAVENOUS | Status: DC
  Administered 2016-12-21 – 2016-12-23 (×2): 2 ug/min via INTRAVENOUS
  Filled 2016-12-21 (×3): qty 4

## 2016-12-21 MED ORDER — ATROPINE SULFATE 1 MG/10ML IJ SOSY
PREFILLED_SYRINGE | INTRAMUSCULAR | Status: AC
  Filled 2016-12-21: qty 10

## 2016-12-21 MED ORDER — ORAL CARE MOUTH RINSE
15.0000 mL | Freq: Four times a day (QID) | OROMUCOSAL | Status: DC
  Administered 2016-12-21 – 2016-12-26 (×19): 15 mL via OROMUCOSAL

## 2016-12-21 MED ORDER — POTASSIUM CHLORIDE 20 MEQ/15ML (10%) PO SOLN
40.0000 meq | Freq: Once | ORAL | Status: AC
Start: 2016-12-21 — End: 2016-12-21
  Administered 2016-12-21: 40 meq via ORAL
  Filled 2016-12-21: qty 30

## 2016-12-21 MED ORDER — MAGNESIUM SULFATE 2 GM/50ML IV SOLN
2.0000 g | Freq: Once | INTRAVENOUS | Status: AC
  Administered 2016-12-21: 2 g via INTRAVENOUS
  Filled 2016-12-21: qty 50

## 2016-12-21 MED ORDER — HEPARIN (PORCINE) IN NACL 100-0.45 UNIT/ML-% IJ SOLN
1300.0000 [IU]/h | INTRAMUSCULAR | Status: DC
  Administered 2016-12-21: 1100 [IU]/h via INTRAVENOUS
  Administered 2016-12-23 – 2016-12-24 (×2): 1300 [IU]/h via INTRAVENOUS
  Filled 2016-12-21 (×6): qty 250

## 2016-12-21 NOTE — Progress Notes (Signed)
Heparin drip has been off since 0845 this am for bedside trach. Per Dr Lamonte Sakai do not restart Heparin drip till 1930 tonight. Called and spoke to [place], Software engineer and made her aware.

## 2016-12-21 NOTE — Progress Notes (Addendum)
ANTICOAGULATION CONSULT NOTE - Follow Up Consult  Pharmacy Consult for heparin Indication: pulmonary embolus  No Known Allergies  Patient Measurements: Height: 5\' 8"  (172.7 cm) Weight: 162 lb 11.2 oz (73.8 kg) IBW/kg (Calculated) : 63.9 Heparin Dosing Weight: 61 kg  Vital Signs: Temp: 98.8 F (37.1 C) (06/07 0839) Temp Source: Oral (06/07 0839) BP: 97/66 (06/07 0800) Pulse Rate: 67 (06/07 0800)  Labs:  Recent Labs  12/19/16 0441 12/19/16 2226 12/20/16 0550 12/20/16 0800 12/20/16 1131 12/20/16 1615 12/21/16 0344  HGB 9.5*  --  7.9*  --  8.2*  --  8.0*  HCT 29.3*  --  23.7*  --  25.0*  --  24.8*  PLT 290  --  305  --   --   --  332  LABPROT  --   --   --   --   --  16.6*  --   INR  --   --   --   --   --  1.33  --   HEPARINUNFRC  --  0.19*  --  0.31  --   --  0.33  CREATININE 0.61 0.58 0.53  --   --   --  0.59    Estimated Creatinine Clearance: 75.4 mL/min (by C-G formula based on SCr of 0.59 mg/dL).   Medications:  Infusions:  . sodium chloride    . famotidine (PEPCID) IV Stopped (12/20/16 2159)  . feeding supplement (VITAL AF 1.2 CAL) Stopped (12/21/16 0000)  . heparin Stopped (12/21/16 0847)  . norepinephrine (LEVOPHED) Adult infusion 1 mcg/min (12/21/16 0600)  . piperacillin-tazobactam (ZOSYN)  IV Stopped (12/21/16 0900)  . potassium phosphate IVPB (mmol) 10 mmol (12/21/16 0515)    Assessment: 61 yo F admitted 12/09/2016 with perforated sigmoid colon s/p exlap, colostomy on 5/27. With two PEA arrests since then. Patient diagnosed with acute PE and pharmacy consulted to start heparin. Left upper extremely [place] also completed that showed no evidence of deep vein thrombosis however multiple segments of superficial vein thromboses. Extubated 6/4.   6/5 pt wound vac removed, transitioned to wet to dry dressing changes and pt began oozing from right mid abd wound. Per Dr. Lake Bells, heparin held until bleeding stabilized and RN could redress wound. Heparin resumed 6/5  pm requiring rate adjustment. Since then RN has reported scant blood from rectum which has resolved. Heparin level remains therapeutic since then (0.33) on gtt at 1100 units/hr, Hgb stable 8 and PLT trending up. No other evidence of bleeding. Will follow very closely.  Pt to get trach 6/7 am, heparin on hold as of ~0900 6/7.  Goal of Therapy:  Heparin level 0.3-0.7 units/ml Monitor platelets by anticoagulation protocol: Yes   Plan:  - Hold heparin for trach placement - Follow up when to resume heparin and resume at 1100 units/hr - Monitor daily heparin level, CBC and signs/symptoms of bleeding - Follow up when able to tolerate oral AC  Melburn Popper, PharmD Clinical Pharmacy Resident Pager: [phone] 12/21/16 8:51 AM   Addendum: ~7672 6/7 spoke to RN and pt back from trach placement. Pt had bleeding per procedure and per Dr. Titus Mould resume at 1930 6/7. Orders updated for start time then. Follow up with morning heparin level.

## 2016-12-21 NOTE — Procedures (Signed)
Name:  [name] MRN:  [id number] DOB:  September 28, 1955  OPERATIVE NOTE  Procedure:  Percutaneous tracheostomy.  Indications:  Ventilator-dependent respiratory failure.  Consent:  Procedure, alternatives, risks and benefits discussed with medical POA.  Questions answered.  Consent obtained.  Anesthesia:  Etomidate, fent  Procedure summary:  Appropriate equipment was assembled.  The patient was identified as Melissa Oconnell and safety timeout was performed. The patient was placed in supine position with a towel roll behind shoulder blades and neck extended.  Sterile technique was used. The patient's neck and upper chest were prepped using chlorhexidine / alcohol scrub and the field was draped in usual sterile fashion with full body drape. After the adequate sedation / anesthesia was achieved, attention was directed at the midline trachea, where the cricothyroid membrane was palpated. Approximately two fingerbreadths above the sternal notch, a horizontal incision was created with a scalpel after local infiltration with 0.2% Lidocaine. Then, using Seldinger technique and a percutaneous tracheostomy set, the trachea was entered with a 14 gauge needle with an overlying sheath. I paused as incision neded to be extended for 6 to pass, extended incision and had venous bleeding, applied 1 0- silk and had good hemostasis. This was all confirmed under direct visualization of a fiberoptic flexible bronchoscope. Entrance into the trachea was identified through the third tracheal ring interspace. Following this, a guidewire was inserted. The needle was removed, leaving the sheath and the guidewire intact. Next, the sheath was removed and a small dilator was inserted. The tracheal rings were then dilated. A #6 Shiley was then opened. The balloon was checked. It was placed over a tracheal dilator, which was then advanced over the guidewire and through the previously dilated tract. The Shiley tracheostomy tube was  noted to pass in the trachea with little resistance. The guidewire and dilator tubes were removed from the trachea. An inner cannula was placed through the tracheostomy tube. The tracheostomy was then secured at the anterior neck with 4 monofilament sutures. The oral endotracheal tube was removed and the ventilator was attached to the newly placed tracheostomy tube. Adequate tidal volumes were noted. The cuff was inflated and no evidence of air leak was noted. No evidence of bleeding was noted. At this point, the procedure was concluded. Post-procedure chest x-ray was ordered.  Complications:  No immediate complications were noted.  Hemodynamic parameters and oxygenation remained stable throughout the procedure.  Estimated blood loss:  Less then 15 mL.  Melissa Oconnell., MD Pulmonary and [facility] Pager: [phone]  12/21/2016, 1:35 PM   Should follow up with [facility] tr[facility] [phone]

## 2016-12-21 NOTE — Progress Notes (Signed)
1. RN says patient HR has dropped to 50s. But BP ok. Has coded Inpast x 2.  PLAN - change neo to levophed  2. [place] Physician Progress Note and Electrolyte Replacement  Patient Name: Melissa Oconnell DOB: Jun 28, 1956 MRN: [id number]  Date of Service  12/21/2016   HPI/Events of Note    Recent Labs Lab 12/16/16 0630 12/16/16 1150 12/16/16 1625 12/17/16 0332 12/18/16 0500 12/19/16 0441 12/19/16 2226 12/20/16 0550 12/21/16 0344  NA 141  --   --  147* 147* 144 142 142 139  K 4.1  --   --  3.2* 3.4* 2.8* 2.9* 3.3* 3.1*  CL 110  --   --  116* 117* 111 111 111 109  CO2 20*  --   --  24 26 26 26 26 24   GLUCOSE 122*  --   --  144* 155* 110* 106* 107* 88  BUN 33*  --   --  34* 29* 25* 20 19 13   CREATININE 1.03*  --   --  0.75 0.69 0.61 0.58 0.53 0.59  CALCIUM 7.8*  --   --  7.4* 7.6* 7.4* 7.2* 7.3* 7.5*  MG 2.3 2.2 2.2 2.0  --   --   --   --  1.8  PHOS 4.3 3.3 3.3 2.7  --   --   --   --  2.1*    Estimated Creatinine Clearance: 75.4 mL/min (by C-G formula based on SCr of 0.59 mg/dL).  Intake/Output      06/06 0701 - 06/07 0700   I.V. (mL/kg) 944.2 (12.8)   NG/GT 1350   IV Piggyback 350   Total Intake(mL/kg) 2644.2 (35.8)   Urine (mL/kg/hr) 890 (0.5)   Stool 650 (0.4)   Total Output 1540   Net +1104.2        - I/O DETAILED x 24h    Total I/O In: 959.5 [I.V.:459.5; NG/GT:400; IV Piggyback:100] Out: 850 [Urine:450; Stool:400] - I/O THIS SHIFT    ASSESSMENT Hypokalemia Hypomagnesemia Hypophosphatemia   eICURN Interventions  Replete all 3   ASSESSMENT: MAJOR ELECTROLYTE      Dr. Brand Males, M.D., [facility].C.P Pulmonary and Critical Care Medicine Staff Physician [place] [facility] Pager: [phone], If no answer or between  15:00h - 7:00h: call [phone]  12/21/2016 5:00 AM

## 2016-12-21 NOTE — Progress Notes (Signed)
Progress Note  Patient Name: Melissa Oconnell Date of Encounter: 12/21/2016  Primary Cardiologist:   New (Dr. Sallyanne Kuster)  Subjective   She had recurrent respiratory distress and required reintubation.  She is awake and alert.  She denies pain or SOB.   Inpatient Medications    Scheduled Meds: . atropine      . chlorhexidine gluconate (MEDLINE KIT)  15 mL Mouth Rinse BID  . Chlorhexidine Gluconate Cloth  6 each Topical Once  . Chlorhexidine Gluconate Cloth  6 each Topical Daily  . etomidate  40 mg Intravenous Once  . fentaNYL (SUBLIMAZE) injection  200 mcg Intravenous Once  . free water  200 mL Per Tube Q3H  . insulin aspart  2-6 Units Subcutaneous Q4H  . mouth rinse  15 mL Mouth Rinse 10 times per day  . midazolam  4 mg Intravenous Once  . pantoprazole (PROTONIX) IV  40 mg Intravenous Q24H  . sodium chloride flush  10-40 mL Intracatheter Q12H  . vecuronium  10 mg Intravenous Once   Continuous Infusions: . sodium chloride    . famotidine (PEPCID) IV 20 mg (12/21/16 0910)  . feeding supplement (VITAL AF 1.2 CAL) Stopped (12/21/16 0000)  . heparin Stopped (12/21/16 0847)  . norepinephrine (LEVOPHED) Adult infusion 1 mcg/min (12/21/16 0600)  . piperacillin-tazobactam (ZOSYN)  IV Stopped (12/21/16 0900)  . potassium phosphate IVPB (mmol) 10 mmol (12/21/16 0515)   PRN Meds: acetaminophen, fentaNYL (SUBLIMAZE) injection, midazolam, ondansetron (ZOFRAN) IV, sodium chloride flush   Vital Signs    Vitals:   12/21/16 0745 12/21/16 0800 12/21/16 0830 12/21/16 0839  BP: 109/78 97/66    Pulse: 61 67    Resp: (!) 21 16    Temp:    98.8 F (37.1 C)  TempSrc:    Oral  SpO2: 100% 100% 98%   Weight:      Height:        Intake/Output Summary (Last 24 hours) at 12/21/16 0958 Last data filed at 12/21/16 0800  Gross per 24 hour  Intake          2710.65 ml  Output             1550 ml  Net          1160.65 ml   Filed Weights   12/19/16 0403 12/20/16 0430 12/21/16 0400  Weight:  136 lb 7.4 oz (61.9 kg) 159 lb 9.8 oz (72.4 kg) 162 lb 11.2 oz (73.8 kg)    Telemetry    NSR - Personally Reviewed  ECG    NA - Personally Reviewed  Physical Exam   GEN: No  acute distress.  Intubated and awake Neck: No  JVD Cardiac:  RRR, no murmurs, rubs, or gallops.  Respiratory: Clear   to auscultation bilaterally. GI: Soft, nontender, non-distended, normal bowel sounds  MS:    Moderate right leg edema; No deformity. Neuro:   Nonfocal  Psych: Oriented and appropriate      Labs    Chemistry  Recent Labs Lab 12/16/16 0630  12/19/16 2226 12/20/16 0550 12/21/16 0344  NA 141  < > 142 142 139  K 4.1  < > 2.9* 3.3* 3.1*  CL 110  < > 111 111 109  CO2 20*  < > _0 GLUCOSE 122*  < > 106* 107* 88  BUN 33*  < > _1 CREATININE 1.03*  < > 0.58 0.53 0.59  CALCIUM 7.8*  < > 7.2* 7.3* 7.5*  PROT 4.8*  --   --   --   --   ALBUMIN 1.6*  --   --   --   --   AST 32  --   --   --   --   ALT 33  --   --   --   --   ALKPHOS 152*  --   --   --   --   BILITOT 0.3  --   --   --   --   GFRNONAA 58*  < > >60 >60 >60  GFRAA >60  < > >60 >60 >60  ANIONGAP 11  < > _0 < > = values in this interval not displayed.   Hematology  Recent Labs Lab 12/19/16 0441 12/20/16 0550 12/20/16 1131 12/21/16 0344  WBC 24.4* 18.8*  --  14.7*  RBC 3.17* 2.54*  --  2.67*  HGB 9.5* 7.9* 8.2* 8.0*  HCT 29.3* 23.7* 25.0* 24.8*  MCV 92.4 93.3  --  92.9  MCH 30.0 31.1  --  30.0  MCHC 32.4 33.3  --  32.3  RDW 14.6 14.9  --  15.5  PLT 290 305  --  332    Cardiac EnzymesNo results for input(s): TROPONINI in the last 168 hours. No results for input(s): TROPIPOC in the last 168 hours.   BNPNo results for input(s): BNP, PROBNP in the last 168 hours.   DDimer No results for input(s): DDIMER in the last 168 hours.   Radiology    Dg Chest Port 1 View  Result Date: 12/21/2016 CLINICAL DATA:  Respiratory failure. EXAM: PORTABLE CHEST 1 VIEW COMPARISON:  12/20/2016 .  CT 12/16/2016  FINDINGS: Endotracheal tube, feeding tube, right IJ line stable position. Heart size normal. Persistent bibasilar infiltrates with dense right base atelectasis. Persistent small bilateral pleural effusions. No interim change. No pneumothorax IMPRESSION: 1.  Lines and tubes in stable position. 2. Persistent bibasilar infiltrates with dense right base atelectasis. Persistent small bilateral pleural effusions. Electronically Signed   By: Marcello Moores  Register   On: 12/21/2016 06:47   Dg Chest Port 1 View  Result Date: 12/20/2016 CLINICAL DATA:  Status post attempted right thoracentesis today. EXAM: PORTABLE CHEST 1 VIEW COMPARISON:  Single-view of the chest earlier today. FINDINGS: Support tubes and lines are unchanged. Extensive right mid and lower lung zone airspace opacity is unchanged. Milder degree of airspace disease and left effusion are also unchanged. No pneumothorax. Cardiac silhouette is obscured. No acute bony abnormality. IMPRESSION: Negative for pneumothorax after attempted thoracentesis. No change in right worse than left airspace disease and effusions. Electronically Signed   By: Inge Rise M.D.   On: 12/20/2016 16:20   Dg Chest Port 1 View  Result Date: 12/20/2016 CLINICAL DATA:  Hypoxia EXAM: PORTABLE CHEST 1 VIEW COMPARISON:  December 19, 2016 FINDINGS: Endotracheal tube tip is 2.7 cm above the carina. Feeding tube tip is below the diaphragm. Central catheter tip is in the superior vena cava. No pneumothorax. There is persistent elevation of the right hemidiaphragm with right pleural effusion and consolidation in the right mid lower lung zones. There is a pleural effusion on the left with left base consolidation, stable. There is less edema in the right upper lung zone compared to 1 day prior. No new opacity. Heart size and pulmonary vascularity are normal. No adenopathy. No bone lesions. IMPRESSION: Tube and catheter positions as described without pneumothorax. Persistent right pleural effusion  with consolidation in the right mid  and lower lung zones. Interval resolution of edema in the right upper lobe. Persistent patchy consolidation and pleural effusion on the left. No new opacity on the left. Stable cardiac silhouette. Electronically Signed   By: Lowella Grip III M.D.   On: 12/20/2016 07:07    Cardiac Studies   ECHO:  - Left ventricle: The cavity size was normal. There was mild   concentric hypertrophy. Systolic function was normal. The   estimated ejection fraction was in the range of 55% to 60%. Wall   motion was normal; there were no regional wall motion   abnormalities. - Aortic valve: Trileaflet; normal thickness leaflets. - Pulmonary arteries: Systolic pressure was within the normal   range. - Inferior vena cava: The vessel was normal in size. - Pericardium, extracardiac: A mild pericardial effusion was   identified posterior to the heart. There was a left pleural   effusion.  Patient Profile     61 y.o. female PERI KREFT a 61 y.o.femalewith a hx of multiple sclerosis and breast cancer who presented 12/09/2016 with acute dysarthria and left-sided weakness,developed respiratory failure and hypotension secondary to septic shock in the setting of perforated sigmoid colon. She had transient severe bradycardia and PEA likely due to respiratory distress (severe lung consolidation and small superimposed PE).   Assessment & Plan    BRADYCARDIA/PEA ARREST:    Suspected secondary to a respiratory cause PE.  Two episodes.  Telemetry without significant arrhythmia.  I reviewed this again today.  No other cardiac issues.  Please call with questions.     PERICARDIAL EFFUSION:  This is small as above.  No further therapy.   RESPIRATORY FAILURE:    Aspiration pneumonia being managed per CCM.   Plan is for trach.    PULMONARY EMBOLISM:    Small.  She remains on IV heparin which was off temporarily with wound bleed.  Ronnell [place], MD  12/21/2016,  9:58 AM

## 2016-12-21 NOTE — Progress Notes (Signed)
[facility] Progress Note 11 Days Post-Op  Subjective: Awake and alert on the ventilator.  Objective: Vital signs in last 24 hours: Temp:  [98.8 F (37.1 C)-101.9 F (38.8 C)] 98.8 F (37.1 C) (06/07 0839) Pulse Rate:  [51-89] 67 (06/07 0800) Resp:  [0-31] 16 (06/07 0800) BP: (74-133)/(44-88) 97/66 (06/07 0800) SpO2:  [92 %-100 %] 98 % (06/07 0830) FiO2 (%):  [40 %] 40 % (06/07 0830) Weight:  [73.8 kg (162 lb 11.2 oz)] 73.8 kg (162 lb 11.2 oz) (06/07 0400) Last BM Date: 12/20/16  Intake/Output from previous day: 06/06 0701 - 06/07 0700 In: 2880.9 [I.V.:1046.9; NG/GT:1350; IV Piggyback:484] Out: 7902 [Urine:940; Stool:650] Intake/Output this shift: Total I/O In: 66.8 [I.V.:24.8; IV Piggyback:42] Out: -   General: No distress  Lungs: Diminished in her right base  Abd: Wound is okay.  Stoma is viable and functioning  Extremities: No changes  Neuro: Intact  Lab Results:  @LABLAST2 (wbc:2,hgb:2,hct:2,plt:2) BMET ) Recent Labs  12/20/16 0550 12/21/16 0344  NA 142 139  K 3.3* 3.1*  CL 111 109  CO2 26 24  GLUCOSE 107* 88  BUN 19 13  CREATININE 0.53 0.59  CALCIUM 7.3* 7.5*   PT/INR  Recent Labs  12/20/16 1615  LABPROT 16.6*  INR 1.33   ABG  Recent Labs  12/19/16 0751 12/19/16 0950  PHART 7.482* 7.448  HCO3 25.5 24.8    Studies/Results: Dg Chest Port 1 View  Result Date: 12/21/2016 CLINICAL DATA:  Respiratory failure. EXAM: PORTABLE CHEST 1 VIEW COMPARISON:  12/20/2016 .  CT 12/16/2016 FINDINGS: Endotracheal tube, feeding tube, right IJ line stable position. Heart size normal. Persistent bibasilar infiltrates with dense right base atelectasis. Persistent small bilateral pleural effusions. No interim change. No pneumothorax IMPRESSION: 1.  Lines and tubes in stable position. 2. Persistent bibasilar infiltrates with dense right base atelectasis. Persistent small bilateral pleural effusions. Electronically Signed   By: Marcello Moores  Register   On: 12/21/2016 06:47    Dg Chest Port 1 View  Result Date: 12/20/2016 CLINICAL DATA:  Status post attempted right thoracentesis today. EXAM: PORTABLE CHEST 1 VIEW COMPARISON:  Single-view of the chest earlier today. FINDINGS: Support tubes and lines are unchanged. Extensive right mid and lower lung zone airspace opacity is unchanged. Milder degree of airspace disease and left effusion are also unchanged. No pneumothorax. Cardiac silhouette is obscured. No acute bony abnormality. IMPRESSION: Negative for pneumothorax after attempted thoracentesis. No change in right worse than left airspace disease and effusions. Electronically Signed   By: Inge Rise M.D.   On: 12/20/2016 16:20   Dg Chest Port 1 View  Result Date: 12/20/2016 CLINICAL DATA:  Hypoxia EXAM: PORTABLE CHEST 1 VIEW COMPARISON:  December 19, 2016 FINDINGS: Endotracheal tube tip is 2.7 cm above the carina. Feeding tube tip is below the diaphragm. Central catheter tip is in the superior vena cava. No pneumothorax. There is persistent elevation of the right hemidiaphragm with right pleural effusion and consolidation in the right mid lower lung zones. There is a pleural effusion on the left with left base consolidation, stable. There is less edema in the right upper lung zone compared to 1 day prior. No new opacity. Heart size and pulmonary vascularity are normal. No adenopathy. No bone lesions. IMPRESSION: Tube and catheter positions as described without pneumothorax. Persistent right pleural effusion with consolidation in the right mid and lower lung zones. Interval resolution of edema in the right upper lobe. Persistent patchy consolidation and pleural effusion on the left. No new opacity on the  left. Stable cardiac silhouette. Electronically Signed   By: Lowella Grip III M.D.   On: 12/20/2016 07:07    Anti-infectives: Anti-infectives    Start     Dose/Rate Route Frequency Ordered Stop   12/17/16 0330  vancomycin (VANCOCIN) 500 mg in sodium chloride 0.9 % 100  mL IVPB  Status:  Discontinued     500 mg 100 mL/hr over 60 Minutes Intravenous Every 12 hours 12/16/16 1439 12/18/16 0908   12/16/16 2100  piperacillin-tazobactam (ZOSYN) IVPB 3.375 g     3.375 g 12.5 mL/hr over 240 Minutes Intravenous Every 8 hours 12/16/16 1439 12/22/16 2359   12/16/16 1530  vancomycin (VANCOCIN) 1,250 mg in sodium chloride 0.9 % 250 mL IVPB     1,250 mg 166.7 mL/hr over 90 Minutes Intravenous  Once 12/16/16 1439 12/16/16 1755   12/16/16 1500  piperacillin-tazobactam (ZOSYN) IVPB 3.375 g     3.375 g 100 mL/hr over 30 Minutes Intravenous  Once 12/16/16 1439 12/16/16 1559   12/12/16 1430  Ampicillin-Sulbactam (UNASYN) 3 g in sodium chloride 0.9 % 100 mL IVPB  Status:  Discontinued     3 g 200 mL/hr over 30 Minutes Intravenous Every 6 hours 12/12/16 1402 12/16/16 1415   12/11/16 2230  vancomycin (VANCOCIN) 500 mg in sodium chloride 0.9 % 100 mL IVPB  Status:  Discontinued     500 mg 100 mL/hr over 60 Minutes Intravenous Every 12 hours 12/11/16 1118 12/12/16 0956   12/11/16 1030  vancomycin (VANCOCIN) 500 mg in sodium chloride 0.9 % 100 mL IVPB  Status:  Discontinued     500 mg 100 mL/hr over 60 Minutes Intravenous Every 12 hours 12/11/16 0944 12/11/16 1115   12/10/16 1700  vancomycin (VANCOCIN) IVPB 750 mg/150 ml premix  Status:  Discontinued     750 mg 150 mL/hr over 60 Minutes Intravenous Every 12 hours 12/09/16 1532 12/10/16 1025   12/10/16 1700  vancomycin (VANCOCIN) IVPB 750 mg/150 ml premix  Status:  Discontinued     750 mg 150 mL/hr over 60 Minutes Intravenous Every 24 hours 12/10/16 1025 12/11/16 0944   12/10/16 0000  piperacillin-tazobactam (ZOSYN) IVPB 3.375 g  Status:  Discontinued     3.375 g 12.5 mL/hr over 240 Minutes Intravenous Every 8 hours 12/09/16 1532 12/12/16 1402   12/09/16 1530  piperacillin-tazobactam (ZOSYN) IVPB 3.375 g     3.375 g 100 mL/hr over 30 Minutes Intravenous  Once 12/09/16 1520 12/09/16 1659   12/09/16 1530  vancomycin (VANCOCIN)  IVPB 1000 mg/200 mL premix     1,000 mg 200 mL/hr over 60 Minutes Intravenous  Once 12/09/16 1520 12/09/16 1801      Assessment/Plan: s/p Procedure(s): EXPLORATORY LAPAROTOMY WITH SIGMOID COLECTOMY ANT HARTMAN'S POUCH For FEES study today.  LOS: 12 days   Kathryne Eriksson. Dahlia Bailiff, MD, FACS [phone] [facility] 12/21/2016

## 2016-12-21 NOTE — Progress Notes (Signed)
Nutrition Follow-up  DOCUMENTATION CODES:   Underweight  INTERVENTION:   -Recommend increasing Vital AF 1.2 to goal of 60 ml/hr providing 1728 kcals, 108 g of protein and 1166 mL of free water.   NUTRITION DIAGNOSIS:   Inadequate oral intake related to inability to eat as evidenced by NPO status.  Being addressed via TF  GOAL:   Patient will meet greater than or equal to 90% of their needs  Met  MONITOR:   Diet advancement, Labs, Vent status, TF tolerance, Skin  REASON FOR ASSESSMENT:   Consult Enteral/tube feeding initiation and management  ASSESSMENT:   61 yo female presented with Lt side weakness, dysarthria.  She has hx of MS.  Initial concern for code stroke.  Noted to have hypotension from sepsis.  Had abdominal pain and CT abd/pelvis concerning for free air. She was taken to the OR for ex-lap and found to have a sigmoid perforation. 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  Patient is currently intubated on ventilator support Off sedation, alert/awake Plan for trach today MV: 11.2 L/min Temp (24hrs), Avg:99.8 F (37.7 C), Min:98.4 F (36.9 C), Max:101.9 F (38.8 C)  Tolerating Vital AF 1.2 at rate of 50 ml/hr with free water flush of 200 mL q 3 hours  Pt with significant wt gain since admission per chart review. Noted pt also +17.5 L since admission which is equivalent to 17.5 kg (38.5 pounds).  Noted edema present in all extremeties.  UOP 940 mL, stool output 650 mL  Labs: phosphorus 2.1 (supplemented), potassium 3.1 (supplemented) Meds: levophed   Diet Order:  Diet NPO time specified  Skin:  Reviewed, no issues  Last BM:  6/7 ostomy (400 mL documeted yesterday)  Height:   Ht Readings from Last 1 Encounters:  12/19/16 '5\' 8"'$  (1.727 m)    Weight:   Wt Readings from Last 1 Encounters:  12/21/16 162 lb 11.2 oz (73.8 kg)    Ideal Body Weight:  63.6 kg  BMI:  Body mass index is 24.74 kg/m.  Estimated Nutritional Needs:  Using admission  weight of 53.2 kg  Kcal:  1719 kcals   Protein:  80-106 g  Fluid:  Per MD  EDUCATION NEEDS:   No education needs identified at this time  [place], [place], LDN [phone] Pager  [phone] Weekend/On-Call Pager

## 2016-12-21 NOTE — Care Management Note (Addendum)
Case Management Note  Patient Details  Name: ELMIRA OLKOWSKI MRN: [id number] Date of Birth: [birth year]  Subjective/Objective:   Pt admitted with Left side weakness - found to have bowel perf -  Now with wound vac and ostomy                   Action/Plan:  PTA lived with caregiver [name] [facility]) Per POA pt is semi independent with ADLs.  Pt extubated today.  PT eval ordered for pt due to additional medical complexity.    Expected Discharge Date:  12/19/16               Expected Discharge Plan:     In-House Referral:     Discharge planning Services  CM Consult  Post Acute Care Choice:    Choice offered to:     DME Arranged:    DME Agency:     HH Arranged:    HH Agency:     Status of Service:     If discussed at [employer], dates discussed:    Additional Comments: 12/21/2016  Attending in agreement with [facility] referral.  Both LTACHs have officially offered pt bed.  CM spoke with Jeannett Senior and informed of referral, provided basic education of LTACH facility and also provided choice.  POA request that each facility contact her today via phone.   Discussed in LOS 12/21/16 - pt remains appropriate for continued stay.  Per physician advisor pt is appropriate for [facility] referral - referral given to both agencies during meeting.  CM will discuss with attending once bed offers are confirmed by agencies Maryclare Labrador, RN 12/21/2016, 8:51 AM

## 2016-12-21 NOTE — Procedures (Signed)
Bronchoscopy Procedure Note HEIDI MACLIN [id number] 1956-07-05  Procedure: Bronchoscopy Indications: Diagnostic evaluation of the airways  Procedure Details Consent: Risks of procedure as well as the alternatives and risks of each were explained to the (patient/caregiver).  Consent for procedure obtained. Time Out: Verified patient identification, verified procedure, site/side was marked, verified correct patient position, special equipment/implants available, medications/allergies/relevent history reviewed, required imaging and test results available.  Performed  In preparation for procedure, patient was given 100% FiO2. Sedation: Benzodiazepines  FOB via ETT to facilitate trach placement. Please refer also to Dr Janit Pagan procedure note. No evidence of posterior tracheal wall puncture or injury during the trach placement. Attempted to clear bloody thick secretions from the RLL, unable to clear these.   Evaluation Hemodynamic Status: BP stable throughout; O2 sats: stable throughout Patient's Current Condition: stable Specimens:  None Complications: No apparent complications Patient did tolerate procedure well.   Vivian Okelley S. 12/21/2016

## 2016-12-21 NOTE — Progress Notes (Signed)
PULMONARY / CRITICAL CARE MEDICINE   Name: Melissa Oconnell MRN: [id number] DOB: Nov 17, 1955    ADMISSION DATE:  12/09/2016 CONSULTATION DATE:  12/09/2016   REFERRING MD:  Jeanell Sparrow  CHIEF COMPLAINT:  confusion  BRIEF SUMMARY:  61 y/o with MS, had perforated sigmoid colon, to OR, VDRF post-op and septic shock. Hospital course complicated by cardiac arrest x2 (aspiration, second with turning on 6/2).  Work up for cardiac arrest led to findings of PE.  She has failed extubation x2.    SUBJECTIVE:   Remains quite weak Thoracentesis on 6/6 was unsuccessful Planning for tracheostomy today    VITAL SIGNS: BP 105/63   Pulse 66   Temp 98.8 F (37.1 C) (Oral)   Resp 18   Ht 5\' 8"  (1.727 m)   Wt 73.8 kg (162 lb 11.2 oz)   SpO2 100%   BMI 24.74 kg/m   HEMODYNAMICS:    VENTILATOR SETTINGS: Vent Mode: PRVC FiO2 (%):  [40 %] 40 % Set Rate:  [16 bmp] 16 bmp Vt Set:  [500 mL-534 mL] 500 mL PEEP:  [5 cmH20] 5 cmH20 Plateau Pressure:  [17 cmH20-20 cmH20] 20 cmH20  INTAKE / OUTPUT:  Intake/Output Summary (Last 24 hours) at 12/21/16 1130 Last data filed at 12/21/16 1100  Gross per 24 hour  Intake          2499.67 ml  Output             1450 ml  Net          1049.67 ml   PHYSICAL EXAMINATION: General: Thin debilitated woman, ventilated, no distress HEENT: ET tube in place, no oral lesions, pupils equal Neuro: Awake, alert, appropriate, follows commands. Global weakness noted CV: Regular, no murmur, no peripheral edema PULM: Decreased breath sounds right base, left is clear GI: Soft, nontender, positive bowel sounds Extremities: No deformity Skin: No rash   LABS:  BMET  Recent Labs Lab 12/19/16 2226 12/20/16 0550 12/21/16 0344  NA 142 142 139  K 2.9* 3.3* 3.1*  CL 111 111 109  CO2 26 26 24   BUN 20 19 13   CREATININE 0.58 0.53 0.59  GLUCOSE 106* 107* 88    Electrolytes  Recent Labs Lab 12/16/16 1625 12/17/16 0332  12/19/16 2226 12/20/16 0550 12/21/16 0344   CALCIUM  --  7.4*  < > 7.2* 7.3* 7.5*  MG 2.2 2.0  --   --   --  1.8  PHOS 3.3 2.7  --   --   --  2.1*  < > = values in this interval not displayed. CBC  Recent Labs Lab 12/19/16 0441 12/20/16 0550 12/20/16 1131 12/21/16 0344  WBC 24.4* 18.8*  --  14.7*  HGB 9.5* 7.9* 8.2* 8.0*  HCT 29.3* 23.7* 25.0* 24.8*  PLT 290 305  --  332    Coag's  Recent Labs Lab 12/16/16 1418 12/16/16 1625 12/20/16 1615  APTT 35  --   --   INR  --  1.45 1.33    Sepsis Markers  Recent Labs Lab 12/14/16 1917 12/14/16 2330  LATICACIDVEN 8.5* 2.9*    ABG  Recent Labs Lab 12/18/16 2346 12/19/16 0751 12/19/16 0950  PHART 7.477* 7.482* 7.448  PCO2ART 35.3 34.1 36.3  PO2ART 51.0* 61.0* 251*    Liver Enzymes  Recent Labs Lab 12/16/16 0630  AST 32  ALT 33  ALKPHOS 152*  BILITOT 0.3  ALBUMIN 1.6*    Cardiac Enzymes No results for input(s): TROPONINI, PROBNP in the last  168 hours.  Glucose  Recent Labs Lab 12/20/16 1548 12/20/16 2001 12/20/16 2333 12/21/16 0351 12/21/16 0755 12/21/16 1115  GLUCAP 93 105* 103* 86 86 79    Imaging Dg Chest Port 1 View  Result Date: 12/21/2016 CLINICAL DATA:  Respiratory failure. EXAM: PORTABLE CHEST 1 VIEW COMPARISON:  12/20/2016 .  CT 12/16/2016 FINDINGS: Endotracheal tube, feeding tube, right IJ line stable position. Heart size normal. Persistent bibasilar infiltrates with dense right base atelectasis. Persistent small bilateral pleural effusions. No interim change. No pneumothorax IMPRESSION: 1.  Lines and tubes in stable position. 2. Persistent bibasilar infiltrates with dense right base atelectasis. Persistent small bilateral pleural effusions. Electronically Signed   By: Marcello Moores  Register   On: 12/21/2016 06:47   Dg Chest Port 1 View  Result Date: 12/20/2016 CLINICAL DATA:  Status post attempted right thoracentesis today. EXAM: PORTABLE CHEST 1 VIEW COMPARISON:  Single-view of the chest earlier today. FINDINGS: Support tubes and  lines are unchanged. Extensive right mid and lower lung zone airspace opacity is unchanged. Milder degree of airspace disease and left effusion are also unchanged. No pneumothorax. Cardiac silhouette is obscured. No acute bony abnormality. IMPRESSION: Negative for pneumothorax after attempted thoracentesis. No change in right worse than left airspace disease and effusions. Electronically Signed   By: Inge Rise M.D.   On: 12/20/2016 16:20   6/3 CXR images independently reviewed, improved aeration RLL  STUDIES:  5/26  CT head/neck angiogram >> no intracranial stenosis 5/27  CT abdomen >> free air, sigmoid colon swelling 5/28  ECHO >> Left ventricle:  The cavity size was normal. Wall thickness was normal. Systolic function was normal. The estimated ejection fraction was in the range of 50% to 55%. Features are consistent with a pseudonormal left ventricular filling pattern, with concomitant abnormal relaxation and increased filling pressure (grade 2 diastolic dysfunction).Right ventricle:  The cavity size was normal. Systolic function was mildly to moderately reduced. 6/02  CTA Chest >> small subsegmental PE, multi-lobar pneumonia 6/05  LUE ultrasound > no DVT but there are multiple segmnets of superficial vein thrombosis through the left upper extremity, left cephalic vein near subclavian vein appears thrombosed, portion of left cephalic near [facility] fossa is thrombosed  CULTURES: Resp 5/27 >> klebsiella, GPC BCx2 5/26 >> negative JP fluid drainage 6/4 >> klebsiella (negative for ESBL)   ANTIBIOTICS: Vanc 5/26 > 5/29 Zosyn 5/26 > 5/29 Unasyn 5/29 > 6/2  Vanc 6/2 > 6/4 Zosyn 6/2 >>  SIGNIFICANT EVENTS: 5/27  OR exlap, colostomy 5/31  Cardiac arrest post extubation 6/01  started pulse dose solumedrol for Solumedrol flare 6/02  arrested in am. PEA > NSVT. Cards called.  Small PE & multilobar pneumonia noted on CT angiogram   LINES/TUBES: ETT 5/26 > 5/30 ETT 5/30 >> 6/4 ETT 6/5  >> 5/26 left subclavian CVL> 6/1 6/1 R IJ CVL >>  DISCUSSION: 61 y/o female with MS baseline who had a perforated sigmoid colon leading to sepsis, encephalopathy, respiratory failure.  Aspiration event with cardiac arrest on 5/31, re-intubated, bronched food removed.  Then bradycardia/PEA arrest on 6/2 early AM, possibly related to pneumonia/respiratory failure with hypoxemia (though on vent, unclear cause).  ASSESSMENT / PLAN:  PULMONARY A: Acute respiratory failure with hypoxemia due to HCAP, weakness Aspiration 5/31 PE - small HCAP/aspiration pneumonia 6/1 Right Pleural Effusion  Right basilar atelectasis P:   PRVC 8cc/kg Respiratory muscle weakness, global weakness is most significant contributor to continued ventilator needs Pressure-support daily, continue efforts at weaning Plan for tracheostomy today  6/7 No pleural fluid was obtained on 6/6 attempted thoracentesis Continue heparin drip for pulmonary embolism Follow chest x-ray Bronchoscopy on 6/7 at time of tracheostomy placement for secretion clearance from the right base  CARDIOVASCULAR A:  Septic shock. Random cortisol 14.6 Cardiac arrest x2 - initial aspiration, second episode due to PE+ PNA? Left arm swelling P:  On low-dose norepi, goal wean to off 6/7 Telemetry monitoring Heparin drip per pharmacy  RENAL A:   AKI Hypernatremia Hypokalemia P:   Follow BMP, urine output Replace electrolytes as indicated Continue free water  GASTROINTESTINAL A:   Sigmoid colon perforation S/p Ostomy P:    Wound and drain management as per CCS plans Continue TF and free water  HEMATOLOGIC A:   Anemia - minor bleeding at surgical wound, some from rectal stump P:  Heparin drip per pharmacy (held for trach today) Transfuse for goal hemoglobin greater than 7 Follow for bleeding, no evidence currently  INFECTIOUS A:   Septic shock due to peritonitis > improved Klebsiella CAP HCAP P:   Continue zosyn for 7  days (through 6/9) Follow her fever, follow to WBC Wound care  ENDOCRINE A:   No acute issues P:   Follow glucose on BMP  NEUROLOGIC A:   MS baseline - worse, has acute flare up; s/p pulse dose solumedrol + critical illness P:   Continue physical therapy Minimize sedation as able   FAMILY  - Updates: updated Barbara (HCPOA) 6/7 at bedside  Independent CC time 35 minutes.   Baltazar Apo, MD, PhD 12/21/2016, 11:42 AM [facility] [phone] or if no answer [phone]

## 2016-12-21 NOTE — Progress Notes (Signed)
Inpatient Rehabilitation  Note that patient remains intubated, which we are not able to accommodate.  Continuing to follow at a distance for potential needs.  Please call with questions.  Carmelia Roller., CCC/SLP Admission Coordinator  [place]  Cell [phone]

## 2016-12-22 ENCOUNTER — Inpatient Hospital Stay (HOSPITAL_COMMUNITY): Payer: Medicare Other

## 2016-12-22 LAB — CBC
HEMATOCRIT: 28.9 % — AB (ref 36.0–46.0)
Hemoglobin: 9.4 g/dL — ABNORMAL LOW (ref 12.0–15.0)
MCH: 30.6 pg (ref 26.0–34.0)
MCHC: 32.5 g/dL (ref 30.0–36.0)
MCV: 94.1 fL (ref 78.0–100.0)
Platelets: 396 10*3/uL (ref 150–400)
RBC: 3.07 MIL/uL — ABNORMAL LOW (ref 3.87–5.11)
RDW: 15.8 % — AB (ref 11.5–15.5)
WBC: 14.9 10*3/uL — ABNORMAL HIGH (ref 4.0–10.5)

## 2016-12-22 LAB — BASIC METABOLIC PANEL
ANION GAP: 7 (ref 5–15)
BUN: 13 mg/dL (ref 6–20)
CALCIUM: 7.5 mg/dL — AB (ref 8.9–10.3)
CHLORIDE: 106 mmol/L (ref 101–111)
CO2: 24 mmol/L (ref 22–32)
Creatinine, Ser: 0.59 mg/dL (ref 0.44–1.00)
GFR calc non Af Amer: >60 mL/min (ref >60)
GLUCOSE: 121 mg/dL — AB (ref 65–99)
POTASSIUM: 3.2 mmol/L — AB (ref 3.5–5.1)
Sodium: 137 mmol/L (ref 135–145)

## 2016-12-22 LAB — GLUCOSE, CAPILLARY
GLUCOSE-CAPILLARY: 120 mg/dL — AB (ref 65–99)
GLUCOSE-CAPILLARY: 104 mg/dL — AB (ref 65–99)
GLUCOSE-CAPILLARY: 109 mg/dL — AB (ref 65–99)
Glucose-Capillary: 96 mg/dL (ref 65–99)
Glucose-Capillary: 120 mg/dL — ABNORMAL HIGH (ref 65–99)
Glucose-Capillary: 107 mg/dL — ABNORMAL HIGH (ref 65–99)

## 2016-12-22 LAB — MAGNESIUM: MAGNESIUM: 1.9 mg/dL (ref 1.7–2.4)

## 2016-12-22 LAB — HEPARIN LEVEL (UNFRACTIONATED)
HEPARIN UNFRACTIONATED: 0.36 [IU]/mL (ref 0.30–0.70)
Heparin Unfractionated: 0.15 IU/mL — ABNORMAL LOW (ref 0.30–0.70)
Heparin Unfractionated: 0.24 IU/mL — ABNORMAL LOW (ref 0.30–0.70)

## 2016-12-22 LAB — PHOSPHORUS: Phosphorus: 2.8 mg/dL (ref 2.5–4.6)

## 2016-12-22 MED ORDER — PANTOPRAZOLE SODIUM 40 MG PO PACK
40.0000 mg | PACK | Freq: Every day | ORAL | Status: DC
  Administered 2016-12-22 – 2016-12-25 (×4): 40 mg
  Filled 2016-12-22 (×4): qty 20

## 2016-12-22 MED ORDER — MIDAZOLAM HCL 2 MG/2ML IJ SOLN
3.0000 mg | Freq: Once | INTRAMUSCULAR | Status: AC
  Administered 2016-12-22: 3 mg via INTRAVENOUS

## 2016-12-22 MED ORDER — FENTANYL CITRATE (PF) 100 MCG/2ML IJ SOLN
150.0000 ug | Freq: Once | INTRAMUSCULAR | Status: AC
  Administered 2016-12-22: 150 ug via INTRAVENOUS

## 2016-12-22 MED ORDER — FENTANYL CITRATE (PF) 100 MCG/2ML IJ SOLN
INTRAMUSCULAR | Status: AC
  Filled 2016-12-22: qty 2

## 2016-12-22 MED ORDER — MIDAZOLAM HCL 2 MG/2ML IJ SOLN
INTRAMUSCULAR | Status: AC
  Filled 2016-12-22: qty 2

## 2016-12-22 MED ORDER — ACETYLCYSTEINE 20 % IN SOLN
4.0000 mL | Freq: Two times a day (BID) | RESPIRATORY_TRACT | Status: AC
  Administered 2016-12-22 – 2016-12-24 (×6): 4 mL via RESPIRATORY_TRACT
  Filled 2016-12-22 (×7): qty 4

## 2016-12-22 MED ORDER — ALBUTEROL SULFATE (2.5 MG/3ML) 0.083% IN NEBU
2.5000 mg | INHALATION_SOLUTION | Freq: Two times a day (BID) | RESPIRATORY_TRACT | Status: DC
  Administered 2016-12-22 – 2016-12-26 (×9): 2.5 mg via RESPIRATORY_TRACT
  Filled 2016-12-22 (×8): qty 3

## 2016-12-22 MED ORDER — GUAIFENESIN 100 MG/5ML PO SOLN
5.0000 mL | Freq: Three times a day (TID) | ORAL | Status: DC
  Administered 2016-12-22 – 2016-12-26 (×12): 100 mg
  Filled 2016-12-22 (×13): qty 5

## 2016-12-22 MED ORDER — POTASSIUM CHLORIDE 20 MEQ/15ML (10%) PO SOLN
40.0000 meq | ORAL | Status: AC
  Administered 2016-12-22 (×2): 40 meq
  Filled 2016-12-22 (×2): qty 30

## 2016-12-22 NOTE — Care Management Note (Signed)
Case Management Note  Patient Details  Name: Melissa Oconnell MRN: [id number] Date of Birth: March 28, 1956  Subjective/Objective:   Pt admitted with Left side weakness - found to have bowel perf -  Now with wound vac and ostomy                   Action/Plan:  PTA lived with caregiver [name] [facility]) Per POA pt is semi independent with ADLs.  Pt extubated today.  PT eval ordered for pt due to additional medical complexity.    Expected Discharge Date:  12/19/16               Expected Discharge Plan:     In-House Referral:     Discharge planning Services  CM Consult  Post Acute Care Choice:    Choice offered to:     DME Arranged:    DME Agency:     HH Arranged:    HH Agency:     Status of Service:     If discussed at [employer], dates discussed:    Additional Comments: 12/22/2016  CM left voicemail for POA requesting call back regarding LTACH choice  12/21/16 Attending in agreement with [facility] referral.  Both LTACHs have officially offered pt bed.  CM spoke with Jeannett Senior and informed of referral, provided basic education of LTACH facility and also provided choice.  POA request that each facility contact her today via phone.   Discussed in LOS 12/21/16 - pt remains appropriate for continued stay.  Per physician advisor pt is appropriate for [facility] referral - referral given to both agencies during meeting.  CM will discuss with attending once bed offers are confirmed by agencies Maryclare Labrador, RN 12/22/2016, 9:38 AM

## 2016-12-22 NOTE — Procedures (Signed)
Bronchoscopy Procedure Note Melissa Oconnell [id number] October 31, 1955  Procedure: Bronchoscopy Indications: Obtain specimens for culture and/or other diagnostic studies and Remove secretions  Procedure Details Consent: Risks of procedure as well as the alternatives and risks of each were explained to the (patient/caregiver).  Consent for procedure obtained. Time Out: Verified patient identification, verified procedure, site/side was marked, verified correct patient position, special equipment/implants available, medications/allergies/relevent history reviewed, required imaging and test results available.  Performed  In preparation for procedure, patient was given 100% FiO2 and bronchoscope lubricated. Sedation: Benzodiazepines  Airway entered and the following bronchi were examined: RUL, RML, RLL, LUL and LLL.   Procedures performed: Secretion suctioned, LLL sample collected for cx data Bronchoscope removed.    Evaluation Hemodynamic Status: BP stable throughout; O2 sats: stable throughout Patient's Current Condition: stable Specimens:  LLL bronchial washings Complications: No apparent complications Patient did tolerate procedure well.   Melissa Oconnell S. 12/22/2016

## 2016-12-22 NOTE — Progress Notes (Signed)
ANTICOAGULATION CONSULT NOTE - FOLLOW UP    HL = 0.36 (goal 0.3 - 0.7 units/mL) Heparin dosing weight = 61 kg   Assessment: 60 YOF continues on IV heparin for acute PE and heparin level is therapeutic.  Patient has had several bleeding events; however, RN reported no bleeding today.   Plan: - Continue heparin gtt at 1300 units/hr - F/U AM labs    Melissa Oconnell D. Mina Marble, PharmD, BCPS 12/22/2016, 9:58 PM

## 2016-12-22 NOTE — Procedures (Signed)
CPT performed via vest.

## 2016-12-22 NOTE — Progress Notes (Signed)
CPT not done at this time.  Pt having Cortrak placed.

## 2016-12-22 NOTE — Progress Notes (Signed)
Inpatient Rehabilitation  Note that patient is now with tracheostomy, but remains vented at this time.  Plan for my co-worker Gerlean Ren to follow up Monday 12/25/16.  Please call with questions.  Carmelia Roller., CCC/SLP Admission Coordinator  [place]  Cell [phone]

## 2016-12-22 NOTE — Progress Notes (Signed)
Cortrak Tube Team Note:  Consult received to place a Cortrak feeding tube.   A 10 F Cortrak tube was placed in the L nare and secured with a nasal bridle at 71 cm. Per the Cortrak monitor reading the tube tip is in the stomach.   No x-ray is required. RN may begin using tube.    If the tube becomes dislodged please keep the tube and contact the Cortrak team at [email or web address] (password [email or web address]) for replacement.  If after hours and replacement cannot be delayed, place a NG tube and confirm placement with an abdominal x-ray.     Jarome Matin, MS, RD, LDN, [facility] Inpatient Clinical Dietitian Pager # [phone] After hours/weekend pager # [phone]

## 2016-12-22 NOTE — Care Management Note (Signed)
Case Management Note  Patient Details  Name: Melissa Oconnell MRN: [id number] Date of Birth: 1956/03/21  Subjective/Objective:   Pt admitted with Left side weakness - found to have bowel perf -  Now with wound vac and ostomy                   Action/Plan:  PTA lived with caregiver [name] [facility]) Per POA pt is semi independent with ADLs.  Pt extubated today.  PT eval ordered for pt due to additional medical complexity.    Expected Discharge Date:  12/19/16               Expected Discharge Plan:     In-House Referral:     Discharge planning Services  CM Consult  Post Acute Care Choice:    Choice offered to:     DME Arranged:    DME Agency:     HH Arranged:    HH Agency:     Status of Service:     If discussed at [employer], dates discussed:    Additional Comments: 12/22/2016  POA has chosen [facility].  Both agencies made aware of POA's choice.  Pt is not currently stable for discharge due to decline in respiratory - pt had to be bronched due to thick secretions and plugging.  CM will continue to follow   12/21/16 Attending in agreement with [facility] referral.  Both LTACHs have officially offered pt bed.  CM spoke with Jeannett Senior and informed of referral, provided basic education of LTACH facility and also provided choice.  POA request that each facility contact her today via phone.   Discussed in LOS 12/21/16 - pt remains appropriate for continued stay.  Per physician advisor pt is appropriate for [facility] referral - referral given to both agencies during meeting.  CM will discuss with attending once bed offers are confirmed by agencies Maryclare Labrador, RN 12/22/2016, 4:03 PM

## 2016-12-22 NOTE — Clinical Social Work Note (Addendum)
Pt is LTACH. Clinical Social Worker will sign off for now as social work intervention is no longer needed. Please consult [place] again if new need arises.   [place], [place]

## 2016-12-22 NOTE — Procedures (Signed)
E-link notified about pt's decrease in SpO2, increased secretions, and increased FiO2. No changes ordered at this time.

## 2016-12-22 NOTE — Progress Notes (Signed)
ANTICOAGULATION CONSULT NOTE - Follow Up Consult  Pharmacy Consult for Heparin  Indication: pulmonary embolus  No Known Allergies  Patient Measurements: Height: 5\' 8"  (172.7 cm) Weight: 166 lb 14.2 oz (75.7 kg) IBW/kg (Calculated) : 63.9  Vital Signs: Temp: 101.5 F (38.6 C) (06/08 0417) Temp Source: Oral (06/08 0417) BP: 123/77 (06/08 0045) Pulse Rate: 82 (06/08 0045)  Labs:  Recent Labs  12/20/16 0550 12/20/16 0800 12/20/16 1131 12/20/16 1615 12/21/16 0344 12/22/16 0417 12/22/16 0418  HGB 7.9*  --  8.2*  --  8.0* 9.4*  --   HCT 23.7*  --  25.0*  --  24.8* 28.9*  --   PLT 305  --   --   --  332 396  --   LABPROT  --   --   --  16.6*  --   --   --   INR  --   --   --  1.33  --   --   --   HEPARINUNFRC  --  0.31  --   --  0.33  --  0.15*  CREATININE 0.53  --   --   --  0.59 0.59  --     Estimated Creatinine Clearance: 75.4 mL/min (by C-G formula based on SCr of 0.59 mg/dL).   Assessment: Sub-therapeutic heparin level after re-start s/p holding for trach, no issues per RN.   Goal of Therapy:  Heparin level 0.3-0.7 units/ml Monitor platelets by anticoagulation protocol: Yes   Plan:  -Inc heparin to 1200 units/hr -1300 HL  Kamen Hanken 12/22/2016,4:58 AM

## 2016-12-22 NOTE — Progress Notes (Signed)
CPT held because of pt desat.

## 2016-12-22 NOTE — Progress Notes (Signed)
PULMONARY / CRITICAL CARE MEDICINE   Name: Melissa Oconnell MRN: [id number] DOB: 08-07-1955    ADMISSION DATE:  12/09/2016 CONSULTATION DATE:  12/09/2016   REFERRING MD:  Jeanell Sparrow  CHIEF COMPLAINT:  confusion  BRIEF SUMMARY:  61 y/o with MS, had perforated sigmoid colon, to OR, VDRF post-op and septic shock. Hospital course complicated by cardiac arrest x2 (aspiration, second with turning on 6/2).  Work up for cardiac arrest led to findings of PE.  She has failed extubation x2.    SUBJECTIVE:   Tracheostomy placed 6/7 Tmax 101.5 Still requiring low dose levophed/ 1-3 mcg Cortrak dislodging, awaiting replacement New L hemithorax  VITAL SIGNS: BP 92/60   Pulse 92   Temp 99.4 F (37.4 C) (Oral)   Resp 19   Ht 5\' 8"  (1.727 m)   Wt 166 lb 14.2 oz (75.7 kg)   SpO2 98%   BMI 25.38 kg/m   HEMODYNAMICS:    VENTILATOR SETTINGS: Vent Mode: CPAP;PSV FiO2 (%):  [40 %-50 %] 40 % Set Rate:  [16 bmp] 16 bmp Vt Set:  [500 mL] 500 mL PEEP:  [5 cmH20] 5 cmH20 Pressure Support:  [10 cmH20] 10 cmH20 Plateau Pressure:  [13 cmH20-23 cmH20] 23 cmH20  INTAKE / OUTPUT:  Intake/Output Summary (Last 24 hours) at 12/22/16 1004 Last data filed at 12/22/16 6812  Gross per 24 hour  Intake          1557.79 ml  Output              855 ml  Net           702.79 ml   PHYSICAL EXAMINATION: General:  Thin adult on MV, NAD HEENT: MM pink/moist, PERRL, sutured trach Neuro: Awake, follows commands, global weakness CV: s1s2 rrr, no m/r/g PULM: even/non-labored on PSV 10/5, right coarse, diminished in RLL and left lung GI: soft, bsx4 active, midline dsg c/d/i, ostomy to LLQ Extremities: coo/dry, 2+  BLE edema, mild swelling to left FA Skin: no rashes or lesions   LABS:  BMET  Recent Labs Lab 12/20/16 0550 12/21/16 0344 12/22/16 0417  NA 142 139 137  K 3.3* 3.1* 3.2*  CL 111 109 106  CO2 26 24 24   BUN 19 13 13   CREATININE 0.53 0.59 0.59  GLUCOSE 107* 88 121*    Electrolytes  Recent  Labs Lab 12/17/16 0332  12/20/16 0550 12/21/16 0344 12/22/16 0417  CALCIUM 7.4*  < > 7.3* 7.5* 7.5*  MG 2.0  --   --  1.8 1.9  PHOS 2.7  --   --  2.1* 2.8  < > = values in this interval not displayed. CBC  Recent Labs Lab 12/20/16 0550 12/20/16 1131 12/21/16 0344 12/22/16 0417  WBC 18.8*  --  14.7* 14.9*  HGB 7.9* 8.2* 8.0* 9.4*  HCT 23.7* 25.0* 24.8* 28.9*  PLT 305  --  332 396    Coag's  Recent Labs Lab 12/16/16 1418 12/16/16 1625 12/20/16 1615  APTT 35  --   --   INR  --  1.45 1.33    Sepsis Markers No results for input(s): LATICACIDVEN, PROCALCITON, O2SATVEN in the last 168 hours.  ABG  Recent Labs Lab 12/18/16 2346 12/19/16 0751 12/19/16 0950  PHART 7.477* 7.482* 7.448  PCO2ART 35.3 34.1 36.3  PO2ART 51.0* 61.0* 251*    Liver Enzymes  Recent Labs Lab 12/16/16 0630  AST 32  ALT 33  ALKPHOS 152*  BILITOT 0.3  ALBUMIN 1.6*    Cardiac  Enzymes No results for input(s): TROPONINI, PROBNP in the last 168 hours.  Glucose  Recent Labs Lab 12/21/16 1115 12/21/16 1525 12/21/16 1953 12/21/16 2338 12/22/16 0332 12/22/16 0800  GLUCAP 79 83 111* 105* 96 120*    Imaging Dg Chest Port 1 View  Result Date: 12/22/2016 CLINICAL DATA:  Acute respiratory failure. EXAM: PORTABLE CHEST 1 VIEW COMPARISON:  Radiographs yesterday, multiple priors. Chest CT 12/17/2015 FINDINGS: Tracheostomy tube at the thoracic inlet. Weighted enteric tube, tip in the region of the mid distal esophagus. Right central line tip in the proximal SVC. Development of left lung volume loss with diffuse opacity throughout the hemithorax, suspicious for mucous plugging. Improving aeration of the right lung with persistent dense consolidation in the perihilar region. No evidence of pneumothorax. IMPRESSION: 1. Volume loss in the left hemithorax with diffuse lung opacity, new from exam yesterday highly suspicious for mucous plugging with partial collapse. 2. Improved aeration at the right  lung base with persistent dense consolidation in the perihilar lung. 3. Retraction of the enteric tube, with tip now in the region of the distal esophagus. These results will be called to the ordering clinician or representative by the Radiologist Assistant, and communication documented in the PACS or zVision Dashboard. Electronically Signed   By: Jeb Levering M.D.   On: 12/22/2016 04:41   Dg Chest Port 1 View  Result Date: 12/21/2016 CLINICAL DATA:  Status post tracheostomy EXAM: PORTABLE CHEST 1 VIEW COMPARISON:  12/21/2016 FINDINGS: Interim placement of tracheostomy tube, the tip is approximately 6 cm superior to the carina. Right-sided central venous catheter tip overlies the SVC. Non inclusion of the left lung base. Moderate right pleural effusion with right middle and lower lobe airspace consolidation. Hazy atelectasis or infiltrate in the left lower lung. IMPRESSION: 1. Placement of tracheostomy tube as above 2. Continued bilateral effusions and bibasilar right greater than left atelectasis or infiltrate. Electronically Signed   By: Donavan Foil M.D.   On: 12/21/2016 14:15   6/3 CXR images independently reviewed, improved aeration RLL  STUDIES:  5/26  CT head/neck angiogram >> no intracranial stenosis 5/27  CT abdomen >> free air, sigmoid colon swelling 5/28  ECHO >> Left ventricle:  The cavity size was normal. Wall thickness was normal. Systolic function was normal. The estimated ejection fraction was in the range of 50% to 55%. Features are consistent with a pseudonormal left ventricular filling pattern, with concomitant abnormal relaxation and increased filling pressure (grade 2 diastolic dysfunction).Right ventricle:  The cavity size was normal. Systolic function was mildly to moderately reduced. 6/02  CTA Chest >> small subsegmental PE, multi-lobar pneumonia 6/05  LUE ultrasound > no DVT but there are multiple segmnets of superficial vein thrombosis through the left upper extremity,  left cephalic vein near subclavian vein appears thrombosed, portion of left cephalic near [facility] fossa is thrombosed  CULTURES: Resp 5/27 >> klebsiella, GPC BCx2 5/26 >> negative JP fluid drainage 6/4 >> klebsiella (negative for ESBL) BC 6/8 > UC 6/8 >  [place] 6/8 >    ANTIBIOTICS: Vanc 5/26 > 5/29 Zosyn 5/26 > 5/29 Unasyn 5/29 > 6/2  Vanc 6/2 > 6/4 Zosyn 6/2 >>6/8  SIGNIFICANT EVENTS: 5/27  OR exlap, colostomy 5/31  Cardiac arrest post extubation 6/01  started pulse dose solumedrol for Solumedrol flare 6/02  arrested in am. PEA > NSVT. Cards called.  Small PE & multilobar pneumonia noted on CT angiogram 6/07  Trach placed/ f/b bronch  6/08  Left h  LINES/TUBES: ETT 5/26 >  5/30 ETT 5/30 >> 6/4 ETT 6/5 >> 6/7 5/26 left subclavian CVL> 6/1 6/1 R IJ CVL >>  DISCUSSION: 61 y/o female with MS baseline who had a perforated sigmoid colon leading to sepsis, encephalopathy, respiratory failure. Aspiration event with cardiac arrest on 5/31, re-intubated, bronched food removed.  Then bradycardia/PEA arrest on 6/2 early AM, possibly related to pneumonia/respiratory failure with hypoxemia (though on vent, unclear cause).  ASSESSMENT / PLAN:  PULMONARY A: Acute respiratory failure with hypoxemia due to HCAP, weakness Aspiration 5/31 PE - small HCAP/aspiration pneumonia 6/1 Right Pleural Effusion - failed thora 6/6 Right basilar atelectasis  Left hemithorax - 6/8 suspect from plugging, secretions have been very thick Tracheostomy   P:   PRVC 8cc/kg Respiratory muscle weakness/ global weakness is most significant contributor to continued ventilator needs Pressure-support daily, continue efforts at weaning Will bronch today given new L hemithorax  Keep right side down Chest PT Start guaifenesin  Start mucomyst nebs q 12hr x 4 doses  Trend CXR Heparin gtt per pharm- spoke with Dr. Hulen Skains and ok from surgical standpoint to start transition to DOAC/ warfarin Will wait 48 hr s/p trach  to consider switch to DOAC  CARDIOVASCULAR A:  Septic shock. Random cortisol 14.6 Cardiac arrest x2 - initial aspiration, second episode due to PE+ PNA? Left arm swelling - neg for DVT, +superficial thrombus 6/8 CVP 3 P:  Tele monitoring On low-dose norepi, goal wean to off  Heparin drip per pharmacy   RENAL A:   AKI - resolved  Hypernatremia- resolved Hypokalemia Hypophosp Net + 18L, 49 lb up from admit weight  P:   KCL replaced 6/8 D/c free water Trend BMP / urinary output Replace electrolytes as indicated Avoid nephrotoxic agents, ensure adequate renal perfusion  GASTROINTESTINAL A:   Protein calorie malnutrition Sigmoid colon perforation S/p Ostomy  P:    Wound and drain management as per [facility] plans Cortrak to be adjusted today Then resume TF Protonix per tube for SUP D/c free water  HEMATOLOGIC A:   Anemia - minor bleeding at surgical wound, some from rectal stump - resolved  P:  Heparin drip as above Transfuse for goal hemoglobin greater than 7 Follow for bleeding  INFECTIOUS A:   Septic shock due to peritonitis > improved Klebsiella CAP HCAP Fever 6/7/ 6/8  P:   7 days Zosyn complete 6/8 Follow fever curve, trend WBC Pan-culture for recurrent fever/ continued pressor needs   ENDOCRINE A:   No acute issues P:   SSI  NEUROLOGIC A:   MS baseline - worse, has acute flare up; s/p pulse dose solumedrol (5/31 - 6/4) + critical illness P:   Continue physical therapy Minimize sedation as able PRN fentanyl    FAMILY  - Updates: updated [name] (HCPOA) 6/7 at bedside No family 6/8.  Independent CC time 40 minutes.   Kennieth Rad, AGACNP-BC [facility] Pgr: [phone] or if no answer [phone] 12/22/2016, 10:04 AM  Attending Note:  I have examined patient, reviewed labs, studies and notes. I have discussed the case with B Simpson, and I agree with the data and plans as amended above. 61 year old woman with a  history of multiple sclerosis admitted with a perforated sigmoid colon, peritonitis, septic shock. This resulted in encephalopathy and respiratory failure. She went to the OR for resection, still has a JP drain in place and fluid that is growing Klebsiella. On my evaluation she is awake, interacts is mechanically ventilated post tracheostomy on 6/7. She has  coarse bilateral breath sounds, decreased bilaterally. Tachycardic without a murmur. Abdomen benign. Chest x-ray today shows improvement in her right basilar volume loss but new diffuse infiltrates and apparent atelectasis on the left. We will continue efforts at minimizing sedation, ventilator weaning. She will undergo bronchoscopy today for culture information and to clear secretions. Continue to try to wean norepinephrine. Hemoglobin has stabilized and Gen. surgery is okay with initiating oral anticoagulation when she stable to do so in other respects. Very low threshold to start empiric antibiotics while cultures are pending. Independent critical care time is 35 minutes.   Baltazar Apo, MD, PhD 12/22/2016, 1:33 PM [facility] [phone] or if no answer [phone]

## 2016-12-22 NOTE — Progress Notes (Signed)
Notified MD Deterding about left hemithorax. New orders for chest PT with vest. Positioned pt with right lung down. Will continue to monitor.

## 2016-12-22 NOTE — Progress Notes (Signed)
[facility] Progress Note 12 Days Post-Op  Subjective: Patient had tracheostomy yesterday.  No acute distress.    Objective: Vital signs in last 24 hours: Temp:  [98.4 F (36.9 C)-101.5 F (38.6 C)] 99.4 F (37.4 C) (06/08 0801) Pulse Rate:  [63-105] 92 (06/08 0808) Resp:  [7-42] 19 (06/08 0808) BP: (69-141)/(30-124) 92/60 (06/08 0808) SpO2:  [84 %-100 %] 98 % (06/08 0808) FiO2 (%):  [40 %-50 %] 40 % (06/08 0808) Weight:  [75.7 kg (166 lb 14.2 oz)] 75.7 kg (166 lb 14.2 oz) (06/08 0352) Last BM Date: 12/20/16  Intake/Output from previous day: 06/07 0701 - 06/08 0700 In: 1759.6 [I.V.:407.5; FW/YO:378.5; IV Piggyback:468] Out: 930 [Urine:715; Drains:15; Stool:200] Intake/Output this shift: Total I/O In: 85.2 [I.V.:85.2] Out: 40 [Urine:40]  General: No distress  Lungs: Clear.  Getting vibratory respiratory treatment currently.  Abd: Soft, benign.  Feeding tube came out.  Needs t be replaced.  Ostomy is viable and pink and putting out good amounts of stool.  Extremities: No changes  Neuro: Intact  Lab Results:  @LABLAST2 (wbc:2,hgb:2,hct:2,plt:2) BMET ) Recent Labs  12/21/16 0344 12/22/16 0417  NA 139 137  K 3.1* 3.2*  CL 109 106  CO2 24 24  GLUCOSE 88 121*  BUN 13 13  CREATININE 0.59 0.59  CALCIUM 7.5* 7.5*   PT/INR  Recent Labs  12/20/16 1615  LABPROT 16.6*  INR 1.33   ABG  Recent Labs  12/19/16 0950  PHART 7.448  HCO3 24.8    Studies/Results: Dg Chest Port 1 View  Result Date: 12/22/2016 CLINICAL DATA:  Acute respiratory failure. EXAM: PORTABLE CHEST 1 VIEW COMPARISON:  Radiographs yesterday, multiple priors. Chest CT 12/17/2015 FINDINGS: Tracheostomy tube at the thoracic inlet. Weighted enteric tube, tip in the region of the mid distal esophagus. Right central line tip in the proximal SVC. Development of left lung volume loss with diffuse opacity throughout the hemithorax, suspicious for mucous plugging. Improving aeration of the right lung with  persistent dense consolidation in the perihilar region. No evidence of pneumothorax. IMPRESSION: 1. Volume loss in the left hemithorax with diffuse lung opacity, new from exam yesterday highly suspicious for mucous plugging with partial collapse. 2. Improved aeration at the right lung base with persistent dense consolidation in the perihilar lung. 3. Retraction of the enteric tube, with tip now in the region of the distal esophagus. These results will be called to the ordering clinician or representative by the Radiologist Assistant, and communication documented in the PACS or zVision Dashboard. Electronically Signed   By: Jeb Levering M.D.   On: 12/22/2016 04:41   Dg Chest Port 1 View  Result Date: 12/21/2016 CLINICAL DATA:  Status post tracheostomy EXAM: PORTABLE CHEST 1 VIEW COMPARISON:  12/21/2016 FINDINGS: Interim placement of tracheostomy tube, the tip is approximately 6 cm superior to the carina. Right-sided central venous catheter tip overlies the SVC. Non inclusion of the left lung base. Moderate right pleural effusion with right middle and lower lobe airspace consolidation. Hazy atelectasis or infiltrate in the left lower lung. IMPRESSION: 1. Placement of tracheostomy tube as above 2. Continued bilateral effusions and bibasilar right greater than left atelectasis or infiltrate. Electronically Signed   By: Donavan Foil M.D.   On: 12/21/2016 14:15   Dg Chest Port 1 View  Result Date: 12/21/2016 CLINICAL DATA:  Respiratory failure. EXAM: PORTABLE CHEST 1 VIEW COMPARISON:  12/20/2016 .  CT 12/16/2016 FINDINGS: Endotracheal tube, feeding tube, right IJ line stable position. Heart size normal. Persistent bibasilar infiltrates with dense  right base atelectasis. Persistent small bilateral pleural effusions. No interim change. No pneumothorax IMPRESSION: 1.  Lines and tubes in stable position. 2. Persistent bibasilar infiltrates with dense right base atelectasis. Persistent small bilateral pleural  effusions. Electronically Signed   By: Marcello Moores  Register   On: 12/21/2016 06:47   Dg Chest Port 1 View  Result Date: 12/20/2016 CLINICAL DATA:  Status post attempted right thoracentesis today. EXAM: PORTABLE CHEST 1 VIEW COMPARISON:  Single-view of the chest earlier today. FINDINGS: Support tubes and lines are unchanged. Extensive right mid and lower lung zone airspace opacity is unchanged. Milder degree of airspace disease and left effusion are also unchanged. No pneumothorax. Cardiac silhouette is obscured. No acute bony abnormality. IMPRESSION: Negative for pneumothorax after attempted thoracentesis. No change in right worse than left airspace disease and effusions. Electronically Signed   By: Inge Rise M.D.   On: 12/20/2016 16:20    Anti-infectives: Anti-infectives    Start     Dose/Rate Route Frequency Ordered Stop   12/17/16 0330  vancomycin (VANCOCIN) 500 mg in sodium chloride 0.9 % 100 mL IVPB  Status:  Discontinued     500 mg 100 mL/hr over 60 Minutes Intravenous Every 12 hours 12/16/16 1439 12/18/16 0908   12/16/16 2100  piperacillin-tazobactam (ZOSYN) IVPB 3.375 g     3.375 g 12.5 mL/hr over 240 Minutes Intravenous Every 8 hours 12/16/16 1439 12/22/16 2359   12/16/16 1530  vancomycin (VANCOCIN) 1,250 mg in sodium chloride 0.9 % 250 mL IVPB     1,250 mg 166.7 mL/hr over 90 Minutes Intravenous  Once 12/16/16 1439 12/16/16 1755   12/16/16 1500  piperacillin-tazobactam (ZOSYN) IVPB 3.375 g     3.375 g 100 mL/hr over 30 Minutes Intravenous  Once 12/16/16 1439 12/16/16 1559   12/12/16 1430  Ampicillin-Sulbactam (UNASYN) 3 g in sodium chloride 0.9 % 100 mL IVPB  Status:  Discontinued     3 g 200 mL/hr over 30 Minutes Intravenous Every 6 hours 12/12/16 1402 12/16/16 1415   12/11/16 2230  vancomycin (VANCOCIN) 500 mg in sodium chloride 0.9 % 100 mL IVPB  Status:  Discontinued     500 mg 100 mL/hr over 60 Minutes Intravenous Every 12 hours 12/11/16 1118 12/12/16 0956   12/11/16 1030   vancomycin (VANCOCIN) 500 mg in sodium chloride 0.9 % 100 mL IVPB  Status:  Discontinued     500 mg 100 mL/hr over 60 Minutes Intravenous Every 12 hours 12/11/16 0944 12/11/16 1115   12/10/16 1700  vancomycin (VANCOCIN) IVPB 750 mg/150 ml premix  Status:  Discontinued     750 mg 150 mL/hr over 60 Minutes Intravenous Every 12 hours 12/09/16 1532 12/10/16 1025   12/10/16 1700  vancomycin (VANCOCIN) IVPB 750 mg/150 ml premix  Status:  Discontinued     750 mg 150 mL/hr over 60 Minutes Intravenous Every 24 hours 12/10/16 1025 12/11/16 0944   12/10/16 0000  piperacillin-tazobactam (ZOSYN) IVPB 3.375 g  Status:  Discontinued     3.375 g 12.5 mL/hr over 240 Minutes Intravenous Every 8 hours 12/09/16 1532 12/12/16 1402   12/09/16 1530  piperacillin-tazobactam (ZOSYN) IVPB 3.375 g     3.375 g 100 mL/hr over 30 Minutes Intravenous  Once 12/09/16 1520 12/09/16 1659   12/09/16 1530  vancomycin (VANCOCIN) IVPB 1000 mg/200 mL premix     1,000 mg 200 mL/hr over 60 Minutes Intravenous  Once 12/09/16 1520 12/09/16 1801      Assessment/Plan: s/p Procedure(s): EXPLORATORY LAPAROTOMY WITH SIGMOID COLECTOMY ANT  HARTMAN'S POUCH Continue currently management.  Patient need only seven days of postoperative antibiotics. We will be around for the weekend but only round on her if requested as she is very stable from a surgical standpoint.  LOS: 13 days   Kathryne Eriksson. Dahlia Bailiff, MD, FACS [phone] [facility] Surgery 12/22/2016

## 2016-12-22 NOTE — Progress Notes (Signed)
eLink Physician-Brief Progress Note Patient Name: Melissa Oconnell DOB: 1955-11-18 MRN: [id number]   Date of Service  12/22/2016  HPI/Events of Note  hypokalemia  eICU Interventions  Potassium replaced     Intervention Category Intermediate Interventions: Electrolyte abnormality - evaluation and management  Jessicamarie Amiri 12/22/2016, 5:14 AM

## 2016-12-22 NOTE — Progress Notes (Signed)
ANTICOAGULATION CONSULT NOTE - Follow Up Consult  Pharmacy Consult for heparin Indication: pulmonary embolus  No Known Allergies  Patient Measurements: Height: 5\' 8"  (172.7 cm) Weight: 166 lb 14.2 oz (75.7 kg) IBW/kg (Calculated) : 63.9 Heparin Dosing Weight: 61 kg  Vital Signs: Temp: 99.4 F (37.4 C) (06/08 0801) Temp Source: Oral (06/08 0801) BP: 92/60 (06/08 0808) Pulse Rate: 92 (06/08 0808)  Labs:  Recent Labs  12/20/16 0550 12/20/16 0800 12/20/16 1131 12/20/16 1615 12/21/16 0344 12/22/16 0417 12/22/16 0418  HGB 7.9*  --  8.2*  --  8.0* 9.4*  --   HCT 23.7*  --  25.0*  --  24.8* 28.9*  --   PLT 305  --   --   --  332 396  --   LABPROT  --   --   --  16.6*  --   --   --   INR  --   --   --  1.33  --   --   --   HEPARINUNFRC  --  0.31  --   --  0.33  --  0.15*  CREATININE 0.53  --   --   --  0.59 0.59  --     Estimated Creatinine Clearance: 75.4 mL/min (by C-G formula based on SCr of 0.59 mg/dL).   Medications:  Infusions:  . sodium chloride    . famotidine (PEPCID) IV Stopped (12/21/16 2253)  . feeding supplement (VITAL AF 1.2 CAL) Stopped (12/22/16 0700)  . heparin 1,200 Units/hr (12/22/16 0800)  . norepinephrine (LEVOPHED) Adult infusion 1 mcg/min (12/22/16 0850)  . piperacillin-tazobactam (ZOSYN)  IV Stopped (12/22/16 9628)    Assessment: 61 yo F admitted 12/09/2016 with perforated sigmoid colon s/p exlap, colostomy on 5/27. With two PEA arrests since then. Patient diagnosed with acute PE and pharmacy consulted to start heparin. Left upper extremely [place] also completed that showed no evidence of deep vein thrombosis however multiple segments of superficial vein thromboses. Trach placed 6/7 and heparin resumed.  Patient has had several bleeding events this admission: 6/5 oozing from right mid abd wound, 6/5-6/6 scan blood from rectum, 6/7 more bleeding than expected during trach procedure (heparin was resumed later due to this). No bleeding issues today per  RN.  Heparin level still subtherapeutic despite rate increase. I am hesitant to increase dose given bleeding events however, given that we are treating a PE I think we have to. Will remain cautious with any dose increases.  Goal of Therapy:  Heparin level 0.3-0.7 units/ml Monitor platelets by anticoagulation protocol: Yes   Plan:  - Increase heparin to 1300 units/hr - 6 hour heparin level - Monitor daily heparin level, CBC and signs/symptoms of bleeding - Follow up when able to tolerate oral AC, per CCM can consider DOAC 48 hours after trach (sunday 6/10)  Melburn Popper, PharmD Clinical Pharmacy Resident Pager: [phone] 12/22/16 10:04 AM

## 2016-12-22 NOTE — Procedures (Signed)
Bedside Bronchoscopy Procedure Note HAZELEE HARBOLD [id number] 02-May-1956  Procedure: Bronchoscopy Indications: Obtain specimens for culture and/or other diagnostic studies and Remove secretions  Procedure Details: Trach #6 shiley In preparation for procedure, Patient hyper-oxygenated with 100 % FiO2 Airway entered and the following bronchi were examined: RUL, RML, RLL, LUL and LLL.   Bronchoscope removed.  , Patient remained on 100% FiO2 throughout procedure.    Evaluation BP (!) 82/55   Pulse 80   Temp 98.6 F (37 C) (Oral)   Resp (!) 22   Ht 5\' 8"  (1.727 m)   Wt 166 lb 14.2 oz (75.7 kg)   SpO2 100%   BMI 25.38 kg/m  Breath Sounds:Rhonch O2 sats: stable throughout Patient's Current Condition: stable Specimens:  Secretions sent for cx data Complications: No apparent complications Patient did tolerate procedure well.   Kathie Dike 12/22/2016, 2:26 PM

## 2016-12-23 ENCOUNTER — Inpatient Hospital Stay (HOSPITAL_COMMUNITY): Payer: Medicare Other

## 2016-12-23 LAB — URINE CULTURE: Culture: >=100000 — AB

## 2016-12-23 LAB — GLUCOSE, CAPILLARY
GLUCOSE-CAPILLARY: 102 mg/dL — AB (ref 65–99)
GLUCOSE-CAPILLARY: 104 mg/dL — AB (ref 65–99)
GLUCOSE-CAPILLARY: 109 mg/dL — AB (ref 65–99)
GLUCOSE-CAPILLARY: 91 mg/dL (ref 65–99)
Glucose-Capillary: 106 mg/dL — ABNORMAL HIGH (ref 65–99)
Glucose-Capillary: 95 mg/dL (ref 65–99)

## 2016-12-23 LAB — RENAL FUNCTION PANEL
Albumin: 1.4 g/dL — ABNORMAL LOW (ref 3.5–5.0)
Anion gap: 5 (ref 5–15)
BUN: 12 mg/dL (ref 6–20)
CHLORIDE: 106 mmol/L (ref 101–111)
CO2: 25 mmol/L (ref 22–32)
CREATININE: 0.66 mg/dL (ref 0.44–1.00)
Calcium: 7.5 mg/dL — ABNORMAL LOW (ref 8.9–10.3)
GFR calc Af Amer: >60 mL/min (ref >60)
GFR calc non Af Amer: >60 mL/min (ref >60)
GLUCOSE: 115 mg/dL — AB (ref 65–99)
Phosphorus: 2.6 mg/dL (ref 2.5–4.6)
Potassium: 3.8 mmol/L (ref 3.5–5.1)
Sodium: 136 mmol/L (ref 135–145)

## 2016-12-23 LAB — CBC
HEMATOCRIT: 22.7 % — AB (ref 36.0–46.0)
HEMOGLOBIN: 7.4 g/dL — AB (ref 12.0–15.0)
MCH: 30.5 pg (ref 26.0–34.0)
MCHC: 32.6 g/dL (ref 30.0–36.0)
MCV: 93.4 fL (ref 78.0–100.0)
Platelets: 366 10*3/uL (ref 150–400)
RBC: 2.43 MIL/uL — ABNORMAL LOW (ref 3.87–5.11)
RDW: 15.9 % — AB (ref 11.5–15.5)
WBC: 12.1 10*3/uL — AB (ref 4.0–10.5)

## 2016-12-23 LAB — PHOSPHORUS: PHOSPHORUS: 2.5 mg/dL (ref 2.5–4.6)

## 2016-12-23 LAB — HEPARIN LEVEL (UNFRACTIONATED): Heparin Unfractionated: 0.34 IU/mL (ref 0.30–0.70)

## 2016-12-23 LAB — MAGNESIUM: MAGNESIUM: 1.7 mg/dL (ref 1.7–2.4)

## 2016-12-23 MED ORDER — VANCOMYCIN HCL IN DEXTROSE 750-5 MG/150ML-% IV SOLN
750.0000 mg | Freq: Two times a day (BID) | INTRAVENOUS | Status: DC
  Administered 2016-12-23 – 2016-12-25 (×4): 750 mg via INTRAVENOUS
  Filled 2016-12-23 (×5): qty 150

## 2016-12-23 MED ORDER — VANCOMYCIN HCL 10 G IV SOLR
1500.0000 mg | Freq: Once | INTRAVENOUS | Status: AC
  Administered 2016-12-23: 1500 mg via INTRAVENOUS
  Filled 2016-12-23: qty 1500

## 2016-12-23 MED ORDER — PIPERACILLIN-TAZOBACTAM 3.375 G IVPB
3.3750 g | Freq: Three times a day (TID) | INTRAVENOUS | Status: DC
  Administered 2016-12-23 – 2016-12-26 (×8): 3.375 g via INTRAVENOUS
  Filled 2016-12-23 (×10): qty 50

## 2016-12-23 MED ORDER — PIPERACILLIN-TAZOBACTAM 3.375 G IVPB 30 MIN
3.3750 g | Freq: Once | INTRAVENOUS | Status: AC
  Administered 2016-12-23: 3.375 g via INTRAVENOUS
  Filled 2016-12-23: qty 50

## 2016-12-23 NOTE — Progress Notes (Signed)
RT returned patient to full support due to increased WOB. Patient suctioned; copious, yellow secretions obtained. Patient tolerated well. No complications. RT will continue to monitor.

## 2016-12-23 NOTE — Progress Notes (Signed)
PULMONARY / CRITICAL CARE MEDICINE   Name: Melissa Oconnell MRN: [id number] DOB: [birth year]    ADMISSION DATE:  12/09/2016 CONSULTATION DATE:  12/09/2016   REFERRING MD:  Jeanell Sparrow  CHIEF COMPLAINT:  confusion  BRIEF SUMMARY:  61 y/o with MS, had perforated sigmoid colon, to OR, VDRF post-op and septic shock. Hospital course complicated by cardiac arrest x2 (aspiration, second with turning on 6/2).  Work up for cardiac arrest led to findings of PE.  She has failed extubation x2.    SUBJECTIVE:   Tracheostomy placed 6/7 Bronchoscopy performed for secretion clearance 6/8 Remains on low-dose norepinephrine  VITAL SIGNS: BP (!) 100/53   Pulse 90   Temp 98.6 F (37 C) (Oral)   Resp 16   Ht 5\' 8"  (1.727 m)   Wt 73.4 kg (161 lb 13.1 oz)   SpO2 100%   BMI 24.60 kg/m   HEMODYNAMICS:    VENTILATOR SETTINGS: Vent Mode: CPAP;PSV FiO2 (%):  [40 %] 40 % Set Rate:  [16 bmp] 16 bmp Vt Set:  [500 mL] 500 mL PEEP:  [5 cmH20] 5 cmH20 Pressure Support:  [10 cmH20] 10 cmH20 Plateau Pressure:  [14 cmH20-18 cmH20] 15 cmH20  INTAKE / OUTPUT:  Intake/Output Summary (Last 24 hours) at 12/23/16 1104 Last data filed at 12/23/16 0600  Gross per 24 hour  Intake          1485.44 ml  Output             1035 ml  Net           450.44 ml   PHYSICAL EXAMINATION: General:  Thin adult on MV, NAD HEENT: MM pink/moist, PERRL, sutured trach Neuro: Awake, follows commands, global weakness CV: s1s2 rrr, no m/r/g PULM: even/non-labored on PSV 10/5, right coarse, diminished in RLL and left lung GI: soft, bsx4 active, midline dsg c/d/i, ostomy to LLQ Extremities: coo/dry, 2+  BLE edema, mild swelling to left FA Skin: no rashes or lesions   LABS:  BMET  Recent Labs Lab 12/21/16 0344 12/22/16 0417 12/23/16 0600  NA 139 137 136  K 3.1* 3.2* 3.8  CL 109 106 106  CO2 24 24 25   BUN 13 13 12   CREATININE 0.59 0.59 0.66  GLUCOSE 88 121* 115*    Electrolytes  Recent Labs Lab 12/21/16 0344  12/22/16 0417 12/23/16 0600  CALCIUM 7.5* 7.5* 7.5*  MG 1.8 1.9 1.7  PHOS 2.1* 2.8 2.6  2.5   CBC  Recent Labs Lab 12/21/16 0344 12/22/16 0417 12/23/16 0600  WBC 14.7* 14.9* 12.1*  HGB 8.0* 9.4* 7.4*  HCT 24.8* 28.9* 22.7*  PLT 332 396 366    Coag's  Recent Labs Lab 12/16/16 1418 12/16/16 1625 12/20/16 1615  APTT 35  --   --   INR  --  1.45 1.33    Sepsis Markers No results for input(s): LATICACIDVEN, PROCALCITON, O2SATVEN in the last 168 hours.  ABG  Recent Labs Lab 12/18/16 2346 12/19/16 0751 12/19/16 0950  PHART 7.477* 7.482* 7.448  PCO2ART 35.3 34.1 36.3  PO2ART 51.0* 61.0* 251*    Liver Enzymes  Recent Labs Lab 12/23/16 0600  ALBUMIN 1.4*    Cardiac Enzymes No results for input(s): TROPONINI, PROBNP in the last 168 hours.  Glucose  Recent Labs Lab 12/22/16 1136 12/22/16 1606 12/22/16 2013 12/22/16 2329 12/23/16 0347 12/23/16 0828  GLUCAP 104* 120* 107* 109* 109* 106*    Imaging Dg Chest Port 1 View  Result Date: 12/23/2016 CLINICAL DATA:  Respiratory failure EXAM: PORTABLE CHEST 1 VIEW COMPARISON:  December 22, 2016 FINDINGS: Endotracheal tube tip is 6.0 cm above the carina. Central catheter tip is in superior vena cava. Feeding tube tip is below the diaphragm. No pneumothorax. There has been partial clearing of airspace consolidation on the left. There still remains moderate consolidation in the left upper and left lower lobes with small left pleural effusion. On the right, there is larger right pleural effusion but slightly less overall consolidation in the mid and lower lung zones. Heart is mildly enlarged with pulmonary venous hypertension. No adenopathy demonstrable by radiography. No bone lesions. IMPRESSION: Tube and catheter positions as described without pneumothorax. Overall less consolidation bilaterally compared to 1 day prior. There are pleural effusions bilaterally, not felt to be changed on the left and slightly larger on the  right. Stable cardiac prominence. Electronically Signed   By: Lowella Grip III M.D.   On: 12/23/2016 07:12   6/3 CXR images independently reviewed, improved aeration RLL  STUDIES:  5/26  CT head/neck angiogram >> no intracranial stenosis 5/27  CT abdomen >> free air, sigmoid colon swelling 5/28  ECHO >> Left ventricle:  The cavity size was normal. Wall thickness was normal. Systolic function was normal. The estimated ejection fraction was in the range of 50% to 55%. Features are consistent with a pseudonormal left ventricular filling pattern, with concomitant abnormal relaxation and increased filling pressure (grade 2 diastolic dysfunction).Right ventricle:  The cavity size was normal. Systolic function was mildly to moderately reduced. 6/02  CTA Chest >> small subsegmental PE, multi-lobar pneumonia 6/05  LUE ultrasound > no DVT but there are multiple segmnets of superficial vein thrombosis through the left upper extremity, left cephalic vein near subclavian vein appears thrombosed, portion of left cephalic near [facility] fossa is thrombosed  CULTURES: Resp 5/27 >> klebsiella, GPC BCx2 5/26 >> negative JP fluid drainage 6/4 >> klebsiella (negative for ESBL) BC 6/8 > UC 6/8 >  Resp cx (FOB) 6/8 >> PMN >>    ANTIBIOTICS: Vanc 5/26 > 5/29 Zosyn 5/26 > 5/29 Unasyn 5/29 > 6/2  Vanc 6/2 > 6/4 Zosyn 6/2 >>6/8  SIGNIFICANT EVENTS: 5/27  OR exlap, colostomy 5/31  Cardiac arrest post extubation 6/01  started pulse dose solumedrol for Solumedrol flare 6/02  arrested in am. PEA > NSVT. Cards called.  Small PE & multilobar pneumonia noted on CT angiogram 6/07  Trach placed/ f/b bronch  6/08  Left h  LINES/TUBES: ETT 5/26 > 5/30 ETT 5/30 >> 6/4 ETT 6/5 >> 6/7 5/26 left subclavian CVL> 6/1 6/1 R IJ CVL >>  DISCUSSION: 61 y/o female with MS baseline who had a perforated sigmoid colon leading to sepsis, encephalopathy, respiratory failure. Aspiration event with cardiac arrest on 5/31,  re-intubated, bronched food removed.  Then bradycardia/PEA arrest on 6/2 early AM, possibly related to pneumonia/respiratory failure with hypoxemia (though on vent, unclear cause).  ASSESSMENT / PLAN:  PULMONARY A: Acute respiratory failure with hypoxemia due to HCAP, weakness Aspiration 5/31 PE - small HCAP/aspiration pneumonia 6/1 Right Pleural Effusion - failed thora 6/6 Right basilar atelectasis  Left hemithorax - 6/8 suspect from plugging, secretions have been very thick Tracheostomy   P:   Currently PRVC 8cc/kg, transition to PSV as she can tolerate May require LTAC placement given her respiratory muscle weakness, global weakness contributing to slow later weaning Follow intermittent chest x-ray Chest physical therapy Continue Mucomyst and guaifenesin as ordered for next 2 days Plan to transition from heparin to [place]  on 6/11   CARDIOVASCULAR A:  Septic shock. Random cortisol 14.6 Cardiac arrest x2 - initial aspiration, second episode due to PE+ PNA? Left arm swelling - neg for DVT, +superficial thrombus 6/8 CVP 3 P:  Continues to require norepi, goal to off Heparin gtt Telemetry  RENAL A:   AKI - resolved  Hypernatremia- resolved Hypokalemia Hypophosp Volume overload  P:   Follow BMP and UOP Replace electrolytes as indicated.  Avoid nephrotoxic agents, ensure adequate renal perfusion  GASTROINTESTINAL A:   Protein calorie malnutrition Sigmoid colon perforation S/p Ostomy  P:    Wound ostomy management, drain management per [facility] plans Cortrak in place TF's  protonix   HEMATOLOGIC A:   Anemia - minor bleeding at surgical wound, some from rectal stump - resolved  P:  Heparin gtt, transition to [place] on 6/11 Follow for bleeding Transfusion threshold hemoglobin greater than 7  INFECTIOUS A:   Septic shock due to peritonitis > improving Klebsiella CAP Suspected HCAP  P:   7 days Zosyn complete 6/8 Based on fever curve, WBC, chest x-ray will  restart empiric antibiotics for possible HCAP on 6/9 Good respiratory sample pending from bronchoscopy on 6/8. We will discontinue antibiotics if negative  ENDOCRINE A:   No acute issues P:   SSI  NEUROLOGIC A:   MS baseline - worse, has acute flare up; s/p pulse dose solumedrol (5/31 - 6/4) + critical illness P:   Continue physical therapy Minimize sedation as able Fentanyl when necessary   FAMILY  - Updates: updated Barbara (HCPOA) 6/8   Independent critical care time is 35 minutes.   Baltazar Apo, MD, PhD 12/23/2016, 11:04 AM [facility] [phone] or if no answer [phone]

## 2016-12-23 NOTE — Progress Notes (Signed)
 Progress Note  Patient Name: Melissa Oconnell Date of Encounter: 12/23/2016  Primary Cardiologist: Croitoru  Subjective   Remains intubated with trach tube in place, awake and alert  Inpatient Medications    Scheduled Meds: . acetylcysteine  4 mL Nebulization BID  . albuterol  2.5 mg Nebulization BID  . chlorhexidine gluconate (MEDLINE KIT)  15 mL Mouth Rinse BID  . Chlorhexidine Gluconate Cloth  6 each Topical Once  . Chlorhexidine Gluconate Cloth  6 each Topical Daily  . guaiFENesin  5 mL Per Tube Q8H  . insulin aspart  2-6 Units Subcutaneous Q4H  . mouth rinse  15 mL Mouth Rinse QID  . pantoprazole sodium  40 mg Per Tube Daily  . sodium chloride flush  10-40 mL Intracatheter Q12H  . vecuronium  10 mg Intravenous Once   Continuous Infusions: . sodium chloride    . feeding supplement (VITAL AF 1.2 CAL) 1,000 mL (12/23/16 0600)  . heparin 1,300 Units/hr (12/23/16 0600)  . norepinephrine (LEVOPHED) Adult infusion 1 mcg/min (12/23/16 0644)   PRN Meds: acetaminophen, fentaNYL (SUBLIMAZE) injection, ondansetron (ZOFRAN) IV   Vital Signs    Vitals:   12/23/16 0700 12/23/16 0812 12/23/16 0825 12/23/16 0900  BP: 102/61 105/60  (!) 100/53  Pulse: 83 78  90  Resp: 20   16  Temp:   98.6 F (37 C)   TempSrc:   Oral   SpO2: 100%   100%  Weight:      Height:        Intake/Output Summary (Last 24 hours) at 12/23/16 0938 Last data filed at 12/23/16 0600  Gross per 24 hour  Intake          1587.04 ml  Output             1065 ml  Net           522.04 ml   Filed Weights   12/21/16 0400 12/22/16 0352 12/23/16 0500  Weight: 162 lb 11.2 oz (73.8 kg) 166 lb 14.2 oz (75.7 kg) 161 lb 13.1 oz (73.4 kg)    Telemetry    Sinus tachy - Personally Reviewed  ECG    none - Personally Reviewed  Physical Exam   GEN: intubated, sedated, no distress Neck: 6 cm JVD, trach tube in place Cardiac: RRR, no murmurs, rubs, or gallops.  Respiratory: scattered rales bilaterally, right  more than left GI: Soft, nontender, non-distended  MS: No edema; No deformity. Neuro:  Nonfocal  Psych: Normal affect   Labs    Chemistry Recent Labs Lab 12/21/16 0344 12/22/16 0417 12/23/16 0600  NA 139 137 136  K 3.1* 3.2* 3.8  CL 109 106 106  CO2 24 24 25  GLUCOSE 88 121* 115*  BUN 13 13 12  CREATININE 0.59 0.59 0.66  CALCIUM 7.5* 7.5* 7.5*  ALBUMIN  --   --  1.4*  GFRNONAA >60 >60 >60  GFRAA >60 >60 >60  ANIONGAP 6 7 5     Hematology Recent Labs Lab 12/21/16 0344 12/22/16 0417 12/23/16 0600  WBC 14.7* 14.9* 12.1*  RBC 2.67* 3.07* 2.43*  HGB 8.0* 9.4* 7.4*  HCT 24.8* 28.9* 22.7*  MCV 92.9 94.1 93.4  MCH 30.0 30.6 30.5  MCHC 32.3 32.5 32.6  RDW 15.5 15.8* 15.9*  PLT 332 396 366    Cardiac EnzymesNo results for input(s): TROPONINI in the last 168 hours. No results for input(s): TROPIPOC in the last 168 hours.   BNPNo results for input(s):   BNP, PROBNP in the last 168 hours.   DDimer No results for input(s): DDIMER in the last 168 hours.   Radiology    Dg Chest Port 1 View  Result Date: 12/23/2016 CLINICAL DATA:  Respiratory failure EXAM: PORTABLE CHEST 1 VIEW COMPARISON:  December 22, 2016 FINDINGS: Endotracheal tube tip is 6.0 cm above the carina. Central catheter tip is in superior vena cava. Feeding tube tip is below the diaphragm. No pneumothorax. There has been partial clearing of airspace consolidation on the left. There still remains moderate consolidation in the left upper and left lower lobes with small left pleural effusion. On the right, there is larger right pleural effusion but slightly less overall consolidation in the mid and lower lung zones. Heart is mildly enlarged with pulmonary venous hypertension. No adenopathy demonstrable by radiography. No bone lesions. IMPRESSION: Tube and catheter positions as described without pneumothorax. Overall less consolidation bilaterally compared to 1 day prior. There are pleural effusions bilaterally, not felt to be  changed on the left and slightly larger on the right. Stable cardiac prominence. Electronically Signed   By: William  Woodruff III M.D.   On: 12/23/2016 07:12   Dg Chest Port 1 View  Result Date: 12/22/2016 CLINICAL DATA:  Acute respiratory failure. EXAM: PORTABLE CHEST 1 VIEW COMPARISON:  Radiographs yesterday, multiple priors. Chest CT 12/17/2015 FINDINGS: Tracheostomy tube at the thoracic inlet. Weighted enteric tube, tip in the region of the mid distal esophagus. Right central line tip in the proximal SVC. Development of left lung volume loss with diffuse opacity throughout the hemithorax, suspicious for mucous plugging. Improving aeration of the right lung with persistent dense consolidation in the perihilar region. No evidence of pneumothorax. IMPRESSION: 1. Volume loss in the left hemithorax with diffuse lung opacity, new from exam yesterday highly suspicious for mucous plugging with partial collapse. 2. Improved aeration at the right lung base with persistent dense consolidation in the perihilar lung. 3. Retraction of the enteric tube, with tip now in the region of the distal esophagus. These results will be called to the ordering clinician or representative by the Radiologist Assistant, and communication documented in the PACS or zVision Dashboard. Electronically Signed   By: Melanie  Ehinger M.D.   On: 12/22/2016 04:41   Dg Chest Port 1 View  Result Date: 12/21/2016 CLINICAL DATA:  Status post tracheostomy EXAM: PORTABLE CHEST 1 VIEW COMPARISON:  12/21/2016 FINDINGS: Interim placement of tracheostomy tube, the tip is approximately 6 cm superior to the carina. Right-sided central venous catheter tip overlies the SVC. Non inclusion of the left lung base. Moderate right pleural effusion with right middle and lower lobe airspace consolidation. Hazy atelectasis or infiltrate in the left lower lung. IMPRESSION: 1. Placement of tracheostomy tube as above 2. Continued bilateral effusions and bibasilar right  greater than left atelectasis or infiltrate. Electronically Signed   By: Kim  Fujinaga M.D.   On: 12/21/2016 14:15    Cardiac Studies   none  Patient Profile     60 y.o. female admitted after perforated sigmoid colon, with PEA arrest, septic shock, s/p trach.   Assessment & Plan    1. PEA/Brady arrest - she is stable at this point and no recurrent bradycardia. Her conduction looks good.  2. Hypokalemia - this is resolved. Follow 3. Pericardia effusion - small. No evidence of progression.   Signed, Gregg Taylor, MD  12/23/2016, 9:38 AM  Patient ID: Melissa Oconnell, female   DOB: 07/26/1955, 60 y.o.   MRN: [id number]  

## 2016-12-23 NOTE — Progress Notes (Signed)
Pharmacy Antibiotic Note  Melissa Oconnell is a 61 y.o. female with history of MS admitted on 12/09/2016 with pneumonia.  Pharmacy has been consulted for vancomycin/zosyn dosing. Afebrile, WBC down 12.1. SCr 0.66 stable, CrCl~75, UOP 0.2 per documentation. S/p bronch on 6/7, re-cultured - pending.  Plan: Zosyn 3.375g IV (46min infusion) x1; then 3.375g IV q8h (4h infusion) Vancomycin 1500mg  IV x1; then 750mg  IV q12h Monitor clinical progress, c/s, renal function F/u de-escalation plan/LOT, vancomycin trough as indicated   Height: 5\' 8"  (172.7 cm) Weight: 161 lb 13.1 oz (73.4 kg) IBW/kg (Calculated) : 63.9  Temp (24hrs), Avg:98.6 F (37 C), Min:98.3 F (36.8 C), Max:99.3 F (37.4 C)   Recent Labs Lab 12/19/16 0441 12/19/16 2226 12/20/16 0550 12/21/16 0344 12/22/16 0417 12/23/16 0600  WBC 24.4*  --  18.8* 14.7* 14.9* 12.1*  CREATININE 0.61 0.58 0.53 0.59 0.59 0.66    Estimated Creatinine Clearance: 75.4 mL/min (by C-G formula based on SCr of 0.66 mg/dL).    No Known Allergies  Antimicrobials this admission:  Vanc 5/26>>5/29; 6/2>> 6/4; 6/9>> Zosyn 5/26>>5/29; 6/2 >> 6/8; 6/9>> Unasyn 5/29 >> 6/2  Dose adjustments this admission:   Microbiology results: 5/26 MRSA - NEG 5/26 Urine - NEG 5/26 Blood - NEG 5/27 TA - rare Kleb pneumoniae; few C. albicans - R to Amp, otherwise pan sensitive 6/8 BCx -  6/8 UC -  6/8 TA - Gstain neg; pending  Elicia Lamp, PharmD, BCPS Clinical Pharmacist 12/23/2016 11:30 AM

## 2016-12-23 NOTE — Progress Notes (Addendum)
ANTICOAGULATION CONSULT NOTE - FOLLOW UP    Heparin level = 0.34 (goal 0.3 - 0.7 units/mL) Heparin dosing weight = 61 kg   Assessment: 60 YOF continues on IV heparin for acute PE, and heparin level remains therapeutic.  Patient has had several bleeding events; however, RN reports no further bleeding, and patient is at low end of goal. Hg drop 9.4>7.4 this AM, plt stable wnl.   Plan: - Continue heparin gtt at 1300 units/hr - Daily heparin level/CBC - Monitor for s/sx bleeding - F/u long-term anticoagulation plan - possibly Sunday per CCM   Elicia Lamp, PharmD, BCPS Clinical Pharmacist Rx Phone # for today: [phone] After 3:30PM, please call Main Rx: #[id number] 12/23/2016 10:40 AM

## 2016-12-23 NOTE — Progress Notes (Signed)
RT NOTE:  CPT held for 0400 round, patient sleeping. Will resume @ 0700.

## 2016-12-24 LAB — BASIC METABOLIC PANEL
Anion gap: 5 (ref 5–15)
BUN: 9 mg/dL (ref 6–20)
CALCIUM: 7.6 mg/dL — AB (ref 8.9–10.3)
CO2: 25 mmol/L (ref 22–32)
CREATININE: 0.61 mg/dL (ref 0.44–1.00)
Chloride: 105 mmol/L (ref 101–111)
GFR calc Af Amer: >60 mL/min (ref >60)
Glucose, Bld: 108 mg/dL — ABNORMAL HIGH (ref 65–99)
Potassium: 3.2 mmol/L — ABNORMAL LOW (ref 3.5–5.1)
SODIUM: 135 mmol/L (ref 135–145)

## 2016-12-24 LAB — GLUCOSE, CAPILLARY
GLUCOSE-CAPILLARY: 100 mg/dL — AB (ref 65–99)
GLUCOSE-CAPILLARY: 92 mg/dL (ref 65–99)
GLUCOSE-CAPILLARY: 97 mg/dL (ref 65–99)
GLUCOSE-CAPILLARY: 98 mg/dL (ref 65–99)
Glucose-Capillary: 101 mg/dL — ABNORMAL HIGH (ref 65–99)
Glucose-Capillary: 112 mg/dL — ABNORMAL HIGH (ref 65–99)

## 2016-12-24 LAB — CBC
HCT: 21 % — ABNORMAL LOW (ref 36.0–46.0)
HEMOGLOBIN: 7 g/dL — AB (ref 12.0–15.0)
MCH: 31.4 pg (ref 26.0–34.0)
MCHC: 33.3 g/dL (ref 30.0–36.0)
MCV: 94.2 fL (ref 78.0–100.0)
PLATELETS: 372 10*3/uL (ref 150–400)
RBC: 2.23 MIL/uL — ABNORMAL LOW (ref 3.87–5.11)
RDW: 16.4 % — ABNORMAL HIGH (ref 11.5–15.5)
WBC: 10.5 10*3/uL (ref 4.0–10.5)

## 2016-12-24 LAB — PHOSPHORUS: PHOSPHORUS: 2.7 mg/dL (ref 2.5–4.6)

## 2016-12-24 LAB — MAGNESIUM: Magnesium: 1.6 mg/dL — ABNORMAL LOW (ref 1.7–2.4)

## 2016-12-24 LAB — HEPARIN LEVEL (UNFRACTIONATED): HEPARIN UNFRACTIONATED: 0.21 [IU]/mL — AB (ref 0.30–0.70)

## 2016-12-24 MED ORDER — APIXABAN 5 MG PO TABS
10.0000 mg | ORAL_TABLET | Freq: Two times a day (BID) | ORAL | Status: DC
  Administered 2016-12-24 – 2016-12-26 (×5): 10 mg via ORAL
  Filled 2016-12-24 (×5): qty 2

## 2016-12-24 MED ORDER — HEPARIN (PORCINE) IN NACL 100-0.45 UNIT/ML-% IJ SOLN
1400.0000 [IU]/h | INTRAMUSCULAR | Status: DC
  Filled 2016-12-24: qty 250

## 2016-12-24 MED ORDER — MAGNESIUM SULFATE 4 GM/100ML IV SOLN
4.0000 g | Freq: Once | INTRAVENOUS | Status: AC
  Administered 2016-12-24: 4 g via INTRAVENOUS
  Filled 2016-12-24: qty 100

## 2016-12-24 MED ORDER — CIPROFLOXACIN IN D5W 400 MG/200ML IV SOLN
400.0000 mg | Freq: Two times a day (BID) | INTRAVENOUS | Status: DC
  Administered 2016-12-24 – 2016-12-25 (×3): 400 mg via INTRAVENOUS
  Filled 2016-12-24 (×4): qty 200

## 2016-12-24 MED ORDER — APIXABAN 2.5 MG PO TABS
2.5000 mg | ORAL_TABLET | Freq: Two times a day (BID) | ORAL | Status: DC
  Filled 2016-12-24: qty 1

## 2016-12-24 MED ORDER — POTASSIUM CHLORIDE 20 MEQ/15ML (10%) PO SOLN
40.0000 meq | ORAL | Status: AC
  Administered 2016-12-24 (×2): 40 meq
  Filled 2016-12-24 (×2): qty 30

## 2016-12-24 MED ORDER — APIXABAN 5 MG PO TABS: 5.0000 mg | ORAL_TABLET | Freq: Two times a day (BID) | ORAL | Status: DC

## 2016-12-24 NOTE — Progress Notes (Addendum)
ANTICOAGULATION CONSULT NOTE - Follow Up Consult  Pharmacy Consult for Heparin > apixaban Indication: pulmonary embolus  No Known Allergies  Patient Measurements: Height: 5\' 8"  (172.7 cm) Weight: 169 lb 1.5 oz (76.7 kg) IBW/kg (Calculated) : 63.9  Vital Signs: Temp: 98.5 F (36.9 C) (06/10 0813) Temp Source: Oral (06/10 0813) BP: 107/39 (06/10 0630) Pulse Rate: 74 (06/10 0630)  Labs:  Recent Labs  12/22/16 0417  12/22/16 2118 12/23/16 0600 12/23/16 0653 12/24/16 0338  HGB 9.4*  --   --  7.4*  --  7.0*  HCT 28.9*  --   --  22.7*  --  21.0*  PLT 396  --   --  366  --  372  HEPARINUNFRC  --   < > 0.36  --  0.34 0.21*  CREATININE 0.59  --   --  0.66  --  0.61  < > = values in this interval not displayed.  Estimated Creatinine Clearance: 81.5 mL/min (by C-G formula based on SCr of 0.61 mg/dL).   Assessment: 26 YOF continuing on IV heparin for acute PE. Pharmacy consulted to transition to apixaban. Patient has had several bleeding events this admit; however, RN reports no further bleeding. Hg drop to 7 this AM, plt stable wnl. Discussed with MD - will dose pt as usual for acute VTE and monitor closely for bleeding. SCr stable wnl.  Goal of Therapy:  VTE treatment/prevention Monitor platelets by anticoagulation protocol: Yes   Plan:  D/c heparin IV when 1st dose of apixaban given - communicated plan with RN Apixaban 10mg  PO BID x 7 days; then 5mg  PO BID Monitor CBC, for s/sx bleeding closely - RN to call if issues   Elicia Lamp, PharmD, BCPS Clinical Pharmacist Rx Phone # for today: [phone] After 3:30PM, please call Main Rx: #[id number] 12/24/2016 9:58 AM

## 2016-12-24 NOTE — Progress Notes (Signed)
eLink Physician-Brief Progress Note Patient Name: MALIYA MARICH DOB: 1956/02/24 MRN: [id number]   Date of Service  12/24/2016  HPI/Events of Note  Hypokalemia and hypomag  eICU Interventions  Potassium and Mag replaced     Intervention Category Intermediate Interventions: Electrolyte abnormality - evaluation and management  Blessyn [place] 12/24/2016, 4:41 AM

## 2016-12-24 NOTE — Progress Notes (Signed)
PULMONARY / CRITICAL CARE MEDICINE   Name: Melissa Oconnell MRN: [id number] DOB: 1955-08-16    ADMISSION DATE:  12/09/2016 CONSULTATION DATE:  12/09/2016   REFERRING MD:  Jeanell Sparrow  CHIEF COMPLAINT:  confusion  BRIEF SUMMARY:  61 y/o with MS, had perforated sigmoid colon, to OR, VDRF post-op and septic shock. Hospital course complicated by cardiac arrest x2 (aspiration, second with turning on 6/2).  Work up for cardiac arrest led to findings of PE.  She has failed extubation x2.    SUBJECTIVE:   Norepinephrine weaned off overnight Foley catheter leaking No pressure support done yet today Empiric antibiotics initiated on 6/9  VITAL SIGNS: BP (!) 107/39   Pulse 74   Temp 98.5 F (36.9 C) (Oral)   Resp (!) 21   Ht 5\' 8"  (1.727 m)   Wt 76.7 kg (169 lb 1.5 oz)   SpO2 100%   BMI 25.71 kg/m   HEMODYNAMICS:    VENTILATOR SETTINGS: Vent Mode: CPAP;PSV FiO2 (%):  [40 %] 40 % Set Rate:  [16 bmp] 16 bmp Vt Set:  [500 mL] 500 mL PEEP:  [5 cmH20] 5 cmH20 Pressure Support:  [10 cmH20] 10 cmH20 Plateau Pressure:  [18 cmH20-22 cmH20] 21 cmH20  INTAKE / OUTPUT:  Intake/Output Summary (Last 24 hours) at 12/24/16 0937 Last data filed at 12/24/16 0700  Gross per 24 hour  Intake          2076.47 ml  Output             1400 ml  Net           676.47 ml   PHYSICAL EXAMINATION: General:  Thin woman on mechanical ventilation, no distress HEENT: Oropharynx clear, trach clean dry and intact, sutures in place Neuro: Awake, alert, follows commands, globally weak CV: Regular, no murmur PULM: Diminished bilateral basilar breath sounds, no crackles GI: Soft, midline dressing clean dry and intact, ostomy well-appearing Extremities: Bilateral lower extremity edema Skin: No rash   LABS:  BMET  Recent Labs Lab 12/22/16 0417 12/23/16 0600 12/24/16 0338  NA 137 136 135  K 3.2* 3.8 3.2*  CL 106 106 105  CO2 24 25 25   BUN 13 12 9   CREATININE 0.59 0.66 0.61  GLUCOSE 121* 115* 108*     Electrolytes  Recent Labs Lab 12/22/16 0417 12/23/16 0600 12/24/16 0338  CALCIUM 7.5* 7.5* 7.6*  MG 1.9 1.7 1.6*  PHOS 2.8 2.6  2.5 2.7   CBC  Recent Labs Lab 12/22/16 0417 12/23/16 0600 12/24/16 0338  WBC 14.9* 12.1* 10.5  HGB 9.4* 7.4* 7.0*  HCT 28.9* 22.7* 21.0*  PLT 396 366 372    Coag's  Recent Labs Lab 12/20/16 1615  INR 1.33    Sepsis Markers No results for input(s): LATICACIDVEN, PROCALCITON, O2SATVEN in the last 168 hours.  ABG  Recent Labs Lab 12/18/16 2346 12/19/16 0751 12/19/16 0950  PHART 7.477* 7.482* 7.448  PCO2ART 35.3 34.1 36.3  PO2ART 51.0* 61.0* 251*    Liver Enzymes  Recent Labs Lab 12/23/16 0600  ALBUMIN 1.4*    Cardiac Enzymes No results for input(s): TROPONINI, PROBNP in the last 168 hours.  Glucose  Recent Labs Lab 12/23/16 1137 12/23/16 1638 12/23/16 1948 12/23/16 2353 12/24/16 0354 12/24/16 0814  GLUCAP 104* 102* 95 91 101* 100*    Imaging No results found. 6/3 CXR images independently reviewed, improved aeration RLL  STUDIES:  5/26  CT head/neck angiogram >> no intracranial stenosis 5/27  CT abdomen >> free  air, sigmoid colon swelling 5/28  ECHO >> Left ventricle:  The cavity size was normal. Wall thickness was normal. Systolic function was normal. The estimated ejection fraction was in the range of 50% to 55%. Features are consistent with a pseudonormal left ventricular filling pattern, with concomitant abnormal relaxation and increased filling pressure (grade 2 diastolic dysfunction).Right ventricle:  The cavity size was normal. Systolic function was mildly to moderately reduced. 6/02  CTA Chest >> small subsegmental PE, multi-lobar pneumonia 6/05  LUE ultrasound > no DVT but there are multiple segmnets of superficial vein thrombosis through the left upper extremity, left cephalic vein near subclavian vein appears thrombosed, portion of left cephalic near [facility] fossa is thrombosed  CULTURES: Resp 5/27  >> klebsiella, GPC BCx2 5/26 >> negative JP fluid drainage 6/4 >> klebsiella (negative for ESBL) BC 6/8 > UC 6/8 > Yeast Resp cx (FOB) 6/8 >> rare Pseudomonas >>    ANTIBIOTICS: Vanc 5/26 > 5/29 Zosyn 5/26 > 5/29 Unasyn 5/29 > 6/2  Vanc 6/2 > 6/4 Zosyn 6/2 >>6/8  SIGNIFICANT EVENTS: 5/27  OR exlap, colostomy 5/31  Cardiac arrest post extubation 6/01  started pulse dose solumedrol for Solumedrol flare 6/02  arrested in am. PEA > NSVT. Cards called.  Small PE & multilobar pneumonia noted on CT angiogram 6/07  Trach placed/ f/b bronch  6/08  Left h  LINES/TUBES: ETT 5/26 > 5/30 ETT 5/30 >> 6/4 ETT 6/5 >> 6/7 5/26 left subclavian CVL> 6/1 6/1 R IJ CVL >>  DISCUSSION: 61 y/o female with MS baseline who had a perforated sigmoid colon leading to sepsis, encephalopathy, respiratory failure. Aspiration event with cardiac arrest on 5/31, re-intubated, bronched food removed.  Then bradycardia/PEA arrest on 6/2 early AM, possibly related to pneumonia/respiratory failure with hypoxemia (though on vent, unclear cause).  ASSESSMENT / PLAN:  PULMONARY A: Acute respiratory failure with hypoxemia due to HCAP, weakness Aspiration 5/31 PE - small HCAP/aspiration pneumonia, treated Right Pleural Effusion - failed thora 6/6 Right basilar atelectasis  Left hemithorax - 6/8 suspect from plugging, secretions have been very thick Tracheostomy  Pseudomonas in sputum 6/8, question colonizer versus pathogen but suspect recurrent HCAP  P:   Push pressure-support ventilation as she can tolerate Suspect that she will require LTAC placement given her respiratory muscle weakness, global weakness contribute to prolonged wean Continue chest PT Complete Mucomyst and guaifenesin regimen Start Eliquis and transition off heparin drip Antibiotics as below   CARDIOVASCULAR A:  Septic shock. Random cortisol 14.6 Cardiac arrest x2 - initial aspiration, second episode due to PE+ PNA? Left arm  swelling - neg for DVT, +superficial thrombus 6/8 CVP 3 P:  Follow hemodynamics, hopefully she will stay off norepinephrine Heparin drip to be converted to Eliquis Telemetry  RENAL A:   AKI - resolved  Hypernatremia- resolved Hypokalemia Hypophosp Volume overload  P:   Follow BMP, urine output Replace electrolytes as indicated   GASTROINTESTINAL A:   Protein calorie malnutrition Sigmoid colon perforation S/p Ostomy  P:    Wound ostomy management, drain management per [facility] plans Feeding tube in place Tube feeds Protonix   HEMATOLOGIC A:   Anemia - minor bleeding at surgical wound, some from rectal stump - resolved  P:  Convert heparin drip to Eliquis on 6/10 Follow for any evidence of bleeding Transfusion threshold hemoglobin greater than 7  INFECTIOUS A:   Septic shock due to peritonitis > improving Klebsiella CAP Suspected HCAP > pseudomonas 6/9  P:   7 days Zosyn complete 6/8  Restarted empiric antibiotics on 6/9. Add Cipro on 6/10 or Pseudomonas, plan to narrow 6/11 if cultures returned. Likely discontinue vancomycin on 6/11  ENDOCRINE A:   No acute issues P:   Sliding scale insulin  NEUROLOGIC A:   MS baseline - worse, has acute flare up; s/p pulse dose solumedrol (5/31 - 6/4) + critical illness P:   Continue physical therapy Minimize sedation as able Fentanyl as needed    FAMILY  - Updates: updated Barbara (HCPOA) 6/8   Independent critical care time is 31 minutes.   Baltazar Apo, MD, PhD 12/24/2016, 9:37 AM [facility] [phone] or if no answer [phone]

## 2016-12-24 NOTE — Progress Notes (Signed)
Progress Note  Patient Name: Melissa Oconnell Date of Encounter: 12/24/2016  Primary Cardiologist: Croitoru  Subjective   Remains intubated but awake and alert.   Inpatient Medications    Scheduled Meds: . acetylcysteine  4 mL Nebulization BID  . albuterol  2.5 mg Nebulization BID  . apixaban  10 mg Oral BID   Followed by  . [START ON 12/31/2016] apixaban  5 mg Oral BID  . chlorhexidine gluconate (MEDLINE KIT)  15 mL Mouth Rinse BID  . Chlorhexidine Gluconate Cloth  6 each Topical Once  . Chlorhexidine Gluconate Cloth  6 each Topical Daily  . guaiFENesin  5 mL Per Tube Q8H  . insulin aspart  2-6 Units Subcutaneous Q4H  . mouth rinse  15 mL Mouth Rinse QID  . pantoprazole sodium  40 mg Per Tube Daily  . sodium chloride flush  10-40 mL Intracatheter Q12H   Continuous Infusions: . sodium chloride 10 mL/hr at 12/24/16 1000  . ciprofloxacin    . feeding supplement (VITAL AF 1.2 CAL) 1,000 mL (12/24/16 1000)  . piperacillin-tazobactam (ZOSYN)  IV Stopped (12/24/16 0734)  . vancomycin Stopped (12/24/16 0027)   PRN Meds: acetaminophen, fentaNYL (SUBLIMAZE) injection, ondansetron (ZOFRAN) IV   Vital Signs    Vitals:   12/24/16 0853 12/24/16 0900 12/24/16 0930 12/24/16 1000  BP:  (!) 107/56 103/69 (!) 76/64  Pulse:  84 91 89  Resp:  (!) 29 (!) 27 (!) 26  Temp:      TempSrc:      SpO2: 100% 100% 100% 100%  Weight:      Height:        Intake/Output Summary (Last 24 hours) at 12/24/16 1017 Last data filed at 12/24/16 1000  Gross per 24 hour  Intake          2322.67 ml  Output             1500 ml  Net           822.67 ml   Filed Weights   12/22/16 0352 12/23/16 0500 12/24/16 0334  Weight: 166 lb 14.2 oz (75.7 kg) 161 lb 13.1 oz (73.4 kg) 169 lb 1.5 oz (76.7 kg)    Telemetry    nsr - Personally Reviewed  ECG    none - Personally Reviewed  Physical Exam   GEN: No acute distress.   Neck: 6 cm JVD Cardiac: RRR, no murmurs, rubs, or gallops.  Respiratory:  Clear to auscultation bilaterally. GI: Soft, nontender, non-distended  MS: No edema; No deformity. Neuro:  Nonfocal  Psych: Normal affect   Labs    Chemistry Recent Labs Lab 12/22/16 0417 12/23/16 0600 12/24/16 0338  NA 137 136 135  K 3.2* 3.8 3.2*  CL 106 106 105  CO2 '24 25 25  '$ GLUCOSE 121* 115* 108*  BUN '13 12 9  '$ CREATININE 0.59 0.66 0.61  CALCIUM 7.5* 7.5* 7.6*  ALBUMIN  --  1.4*  --   GFRNONAA >60 >60 >60  GFRAA >60 >60 >60  ANIONGAP '7 5 5     '$ Hematology Recent Labs Lab 12/22/16 0417 12/23/16 0600 12/24/16 0338  WBC 14.9* 12.1* 10.5  RBC 3.07* 2.43* 2.23*  HGB 9.4* 7.4* 7.0*  HCT 28.9* 22.7* 21.0*  MCV 94.1 93.4 94.2  MCH 30.6 30.5 31.4  MCHC 32.5 32.6 33.3  RDW 15.8* 15.9* 16.4*  PLT 396 366 372    Cardiac EnzymesNo results for input(s): TROPONINI in the last 168 hours. No results for input(s):  TROPIPOC in the last 168 hours.   BNPNo results for input(s): BNP, PROBNP in the last 168 hours.   DDimer No results for input(s): DDIMER in the last 168 hours.   Radiology    Dg Chest Port 1 View  Result Date: 12/23/2016 CLINICAL DATA:  Respiratory failure EXAM: PORTABLE CHEST 1 VIEW COMPARISON:  December 22, 2016 FINDINGS: Endotracheal tube tip is 6.0 cm above the carina. Central catheter tip is in superior vena cava. Feeding tube tip is below the diaphragm. No pneumothorax. There has been partial clearing of airspace consolidation on the left. There still remains moderate consolidation in the left upper and left lower lobes with small left pleural effusion. On the right, there is larger right pleural effusion but slightly less overall consolidation in the mid and lower lung zones. Heart is mildly enlarged with pulmonary venous hypertension. No adenopathy demonstrable by radiography. No bone lesions. IMPRESSION: Tube and catheter positions as described without pneumothorax. Overall less consolidation bilaterally compared to 1 day prior. There are pleural effusions  bilaterally, not felt to be changed on the left and slightly larger on the right. Stable cardiac prominence. Electronically Signed   By: Lowella Grip III M.D.   On: 12/23/2016 07:12    Cardiac Studies   none  Patient Profile     61 y.o. female admitted with perforated sigmoid colon, PEA arrest, sepsis, s/p trach, s/p failed extubation  Assessment & Plan    1. PEA/Brady arrest - no recurrent or symptomatic bradycardia. Will follow 2. Hypokalemia - continue to replete 3. Pericardial effusion - small be echo. Would not repeat the echo at this point unless evidence of worsening.  Signed, Cristopher Peru, MD  12/24/2016, 10:17 AM  Patient ID: Melissa Oconnell, female   DOB: 20-Apr-1956, 61 y.o.   MRN: [id number]

## 2016-12-24 NOTE — Progress Notes (Signed)
ANTICOAGULATION CONSULT NOTE - Follow Up Consult  Pharmacy Consult for Heparin  Indication: pulmonary embolus  No Known Allergies  Patient Measurements: Height: 5\' 8"  (172.7 cm) Weight: 169 lb 1.5 oz (76.7 kg) IBW/kg (Calculated) : 63.9  Vital Signs: Temp: 98.8 F (37.1 C) (06/10 0356) Temp Source: Oral (06/10 0356) BP: 108/57 (06/10 0345) Pulse Rate: 81 (06/10 0345)  Labs:  Recent Labs  12/22/16 0417  12/22/16 2118 12/23/16 0600 12/23/16 0653 12/24/16 0338  HGB 9.4*  --   --  7.4*  --  7.0*  HCT 28.9*  --   --  22.7*  --  21.0*  PLT 396  --   --  366  --  372  HEPARINUNFRC  --   < > 0.36  --  0.34 0.21*  CREATININE 0.59  --   --  0.66  --  0.61  < > = values in this interval not displayed.  Estimated Creatinine Clearance: 81.5 mL/min (by C-G formula based on SCr of 0.61 mg/dL).   Assessment: 61 y/o F on heparin for PE, heparin level is sub-therapeutic, noted Hgb low at 7, was also low at 7.4 yesterday, no known bleeding issues per RN.   Goal of Therapy:  Heparin level 0.3-0.7 units/ml Monitor platelets by anticoagulation protocol: Yes   Plan:  -Inc heparin to 1400 units/hr -1300 HL  Lin Glazier 12/24/2016,5:16 AM

## 2016-12-25 LAB — BASIC METABOLIC PANEL
ANION GAP: 4 — AB (ref 5–15)
BUN: 10 mg/dL (ref 6–20)
CALCIUM: 7.6 mg/dL — AB (ref 8.9–10.3)
CHLORIDE: 105 mmol/L (ref 101–111)
CO2: 25 mmol/L (ref 22–32)
CREATININE: 0.56 mg/dL (ref 0.44–1.00)
GFR calc Af Amer: >60 mL/min (ref >60)
GFR calc non Af Amer: >60 mL/min (ref >60)
GLUCOSE: 96 mg/dL (ref 65–99)
Potassium: 3.6 mmol/L (ref 3.5–5.1)
Sodium: 134 mmol/L — ABNORMAL LOW (ref 135–145)

## 2016-12-25 LAB — GLUCOSE, CAPILLARY
GLUCOSE-CAPILLARY: 104 mg/dL — AB (ref 65–99)
GLUCOSE-CAPILLARY: 106 mg/dL — AB (ref 65–99)
Glucose-Capillary: 100 mg/dL — ABNORMAL HIGH (ref 65–99)
Glucose-Capillary: 90 mg/dL (ref 65–99)
Glucose-Capillary: 106 mg/dL — ABNORMAL HIGH (ref 65–99)
Glucose-Capillary: 96 mg/dL (ref 65–99)

## 2016-12-25 LAB — CBC
HEMATOCRIT: 20.4 % — AB (ref 36.0–46.0)
Hemoglobin: 6.6 g/dL — CL (ref 12.0–15.0)
MCH: 30.3 pg (ref 26.0–34.0)
MCHC: 32.4 g/dL (ref 30.0–36.0)
MCV: 93.6 fL (ref 78.0–100.0)
Platelets: 369 10*3/uL (ref 150–400)
RBC: 2.18 MIL/uL — ABNORMAL LOW (ref 3.87–5.11)
RDW: 16.1 % — AB (ref 11.5–15.5)
WBC: 8.7 10*3/uL (ref 4.0–10.5)

## 2016-12-25 LAB — PREPARE RBC (CROSSMATCH)

## 2016-12-25 MED ORDER — FUROSEMIDE 10 MG/ML IJ SOLN
40.0000 mg | Freq: Two times a day (BID) | INTRAMUSCULAR | Status: DC
  Administered 2016-12-25 – 2016-12-26 (×3): 40 mg via INTRAVENOUS
  Filled 2016-12-25 (×3): qty 4

## 2016-12-25 MED ORDER — SODIUM CHLORIDE 0.9 % IV SOLN
Freq: Once | INTRAVENOUS | Status: AC
  Administered 2016-12-25: via INTRAVENOUS

## 2016-12-25 NOTE — Progress Notes (Signed)
Inpatient Rehabilitation  Pt. remains vented.  Pt's caregiver Derenda Mis at the bedside.  She indicated pt. lives with her and she provides 24 hour care.  We will continue to follow at a distance for any potential need for CIR.  Note pt. may require LTACH when medically stable.    [facility] Admissions Coordinator Cell [phone] Office [phone]

## 2016-12-25 NOTE — Progress Notes (Signed)
Physical therapy cancellation 6/8   12/22/16 0383  PT Visit Information  Last PT Received On 12/22/16  Reason Eval/Treat Not Completed Medical issues which prohibited therapy (RN requests hold secondary to probable bronchoscopy today. Will follow next week)   Elwyn Reach, [place]

## 2016-12-25 NOTE — Progress Notes (Signed)
PT Cancellation Note  Patient Details Name: Melissa Oconnell MRN: [id number] DOB: 07-29-1955   Cancelled Treatment:    Reason Eval/Treat Not Completed: Medical issues which prohibited therapy (pt with Hgb of 6.6 awaiting blood and currently not medically appropriate)   Kimela Malstrom B Burton Gahan 12/25/2016, 7:16 AM  Elwyn Reach, [place]

## 2016-12-25 NOTE — Progress Notes (Signed)
[facility] Progress Note 15 Days Post-Op  Subjective: Doing well. No events.    Objective: Vital signs in last 24 hours: Temp:  [98.8 F (37.1 C)-99.5 F (37.5 C)] 99.5 F (37.5 C) (06/11 0748) Pulse Rate:  [74-92] 80 (06/11 0800) Resp:  [14-29] 17 (06/11 0800) BP: (64-108)/(20-73) 97/59 (06/11 0800) SpO2:  [10 %-100 %] 100 % (06/11 0800) FiO2 (%):  [40 %] 40 % (06/11 0850) Weight:  [77.4 kg (170 lb 10.2 oz)] 77.4 kg (170 lb 10.2 oz) (06/11 0500) Last BM Date: 12/24/16  Intake/Output from previous day: 06/10 0701 - 06/11 0700 In: 1003 [I.V.:223; NG/GT:780] Out: 1840 [Urine:1210; Drains:5; Stool:625] Intake/Output this shift: No intake/output data recorded.  General: No distress  Lungs: no distress.    Abd: Soft, benign.  Ostomy pink with brown stool and flatus in bag.   JP low volume and serous.  Extremities: No changes  Neuro: Intact  Lab Results:   Recent Labs  12/24/16 0338 12/25/16 0600  NA 135 134*  K 3.2* 3.6  CL 105 105  CO2 25 25  GLUCOSE 108* 96  BUN 9 10  CREATININE 0.61 0.56  CALCIUM 7.6* 7.6*   PT/INR No results for input(s): LABPROT, INR in the last 72 hours. ABG No results for input(s): PHART, HCO3 in the last 72 hours.  Invalid input(s): PCO2, PO2  Studies/Results: No results found.  Anti-infectives: Anti-infectives    Start     Dose/Rate Route Frequency Ordered Stop   12/24/16 1000  ciprofloxacin (CIPRO) IVPB 400 mg     400 mg 200 mL/hr over 60 Minutes Intravenous Every 12 hours 12/24/16 0949     12/24/16 0000  vancomycin (VANCOCIN) IVPB 750 mg/150 ml premix     750 mg 150 mL/hr over 60 Minutes Intravenous Every 12 hours 12/23/16 1137     12/23/16 2000  piperacillin-tazobactam (ZOSYN) IVPB 3.375 g     3.375 g 12.5 mL/hr over 240 Minutes Intravenous Every 8 hours 12/23/16 1137     12/23/16 1145  piperacillin-tazobactam (ZOSYN) IVPB 3.375 g     3.375 g 100 mL/hr over 30 Minutes Intravenous  Once 12/23/16 1137 12/23/16 1407   12/23/16 1145  vancomycin (VANCOCIN) 1,500 mg in sodium chloride 0.9 % 500 mL IVPB     1,500 mg 250 mL/hr over 120 Minutes Intravenous  Once 12/23/16 1137 12/23/16 1624   12/17/16 0330  vancomycin (VANCOCIN) 500 mg in sodium chloride 0.9 % 100 mL IVPB  Status:  Discontinued     500 mg 100 mL/hr over 60 Minutes Intravenous Every 12 hours 12/16/16 1439 12/18/16 0908   12/16/16 2100  piperacillin-tazobactam (ZOSYN) IVPB 3.375 g     3.375 g 12.5 mL/hr over 240 Minutes Intravenous Every 8 hours 12/16/16 1439 12/22/16 2359   12/16/16 1530  vancomycin (VANCOCIN) 1,250 mg in sodium chloride 0.9 % 250 mL IVPB     1,250 mg 166.7 mL/hr over 90 Minutes Intravenous  Once 12/16/16 1439 12/16/16 1755   12/16/16 1500  piperacillin-tazobactam (ZOSYN) IVPB 3.375 g     3.375 g 100 mL/hr over 30 Minutes Intravenous  Once 12/16/16 1439 12/16/16 1559   12/12/16 1430  Ampicillin-Sulbactam (UNASYN) 3 g in sodium chloride 0.9 % 100 mL IVPB  Status:  Discontinued     3 g 200 mL/hr over 30 Minutes Intravenous Every 6 hours 12/12/16 1402 12/16/16 1415   12/11/16 2230  vancomycin (VANCOCIN) 500 mg in sodium chloride 0.9 % 100 mL IVPB  Status:  Discontinued  500 mg 100 mL/hr over 60 Minutes Intravenous Every 12 hours 12/11/16 1118 12/12/16 0956   12/11/16 1030  vancomycin (VANCOCIN) 500 mg in sodium chloride 0.9 % 100 mL IVPB  Status:  Discontinued     500 mg 100 mL/hr over 60 Minutes Intravenous Every 12 hours 12/11/16 0944 12/11/16 1115   12/10/16 1700  vancomycin (VANCOCIN) IVPB 750 mg/150 ml premix  Status:  Discontinued     750 mg 150 mL/hr over 60 Minutes Intravenous Every 12 hours 12/09/16 1532 12/10/16 1025   12/10/16 1700  vancomycin (VANCOCIN) IVPB 750 mg/150 ml premix  Status:  Discontinued     750 mg 150 mL/hr over 60 Minutes Intravenous Every 24 hours 12/10/16 1025 12/11/16 0944   12/10/16 0000  piperacillin-tazobactam (ZOSYN) IVPB 3.375 g  Status:  Discontinued     3.375 g 12.5 mL/hr over 240  Minutes Intravenous Every 8 hours 12/09/16 1532 12/12/16 1402   12/09/16 1530  piperacillin-tazobactam (ZOSYN) IVPB 3.375 g     3.375 g 100 mL/hr over 30 Minutes Intravenous  Once 12/09/16 1520 12/09/16 1659   12/09/16 1530  vancomycin (VANCOCIN) IVPB 1000 mg/200 mL premix     1,000 mg 200 mL/hr over 60 Minutes Intravenous  Once 12/09/16 1520 12/09/16 1801      Assessment/Plan: s/p Procedure(s): EXPLORATORY LAPAROTOMY WITH SIGMOID COLECTOMY ANT HARTMAN'S POUCH Continue currently management.  D/c right JP. Wound vac to abdominal wound.  Likely will just need around 2-3 weeks. Agree with LTAC. Follow up with Dr. Brantley Stage as outpatient.   Will sign off, call for questions.    LOS: 16 days    [facility] 12/25/2016

## 2016-12-25 NOTE — Progress Notes (Signed)
CRITICAL VALUE ALERT  Critical Value:  Hb 6.6  Date & Time Notied:  12/25/16 0650  Provider Notified: Dr. Elsworth Soho  Orders Received/Actions taken: infuse 1unit PRBC's

## 2016-12-25 NOTE — Progress Notes (Signed)
OT Cancellation Note  Patient Details Name: KIONA BLUME MRN: [id number] DOB: 1956/03/31   Cancelled Treatment:    Reason Eval/Treat Not Completed: Medical issues which prohibited therapy.  Hbg 6.6.  [place], OTR/L [phone]   Lucille Passy M 12/25/2016, 12:33 PM

## 2016-12-25 NOTE — Progress Notes (Signed)
PULMONARY / CRITICAL CARE MEDICINE   Name: DRESDEN AMENT MRN: [id number] DOB: 1955-10-25    ADMISSION DATE:  12/09/2016 CONSULTATION DATE:  12/09/2016   REFERRING MD:  Jeanell Sparrow  CHIEF COMPLAINT:  confusion  BRIEF SUMMARY:  61 y/o with MS, had perforated sigmoid colon, to OR, VDRF post-op and septic shock. Hospital course complicated by cardiac arrest x2 (aspiration, second with turning on 6/2).  Work up for cardiac arrest led to findings of PE.  She has failed extubation x2.    SUBJECTIVE:   Remains off norepinephrine Currently doing pressure-support 10 Hemoglobin low this morning as below, one unit blood ordered   VITAL SIGNS: BP 97/77   Pulse 99   Temp 98 F (36.7 C) (Oral)   Resp (!) 35   Ht 5\' 8"  (1.727 m)   Wt 77.4 kg (170 lb 10.2 oz)   SpO2 92%   BMI 25.95 kg/m   HEMODYNAMICS:    VENTILATOR SETTINGS: Vent Mode: CPAP;PSV FiO2 (%):  [40 %] 40 % Set Rate:  [16 bmp] 16 bmp Vt Set:  [500 mL] 500 mL PEEP:  [5 cmH20] 5 cmH20 Pressure Support:  [10 cmH20] 10 cmH20 Plateau Pressure:  [16 cmH20-20 cmH20] 16 cmH20  INTAKE / OUTPUT:  Intake/Output Summary (Last 24 hours) at 12/25/16 1559 Last data filed at 12/25/16 1500  Gross per 24 hour  Intake             1230 ml  Output             2155 ml  Net             -925 ml   PHYSICAL EXAMINATION: General:  Thin woman on mechanical ventilation, no distress HEENT: Oropharynx clear, trach clean dry and intact, sutures in place Neuro: Awake, alert, follows commands, globally weak CV: Regular, no murmur PULM: Diminished bilateral basilar breath sounds, no crackles GI: Soft, midline dressing clean dry and intact, ostomy well-appearing Extremities: Bilateral lower extremity edema Skin: No rash   LABS:  BMET  Recent Labs Lab 12/23/16 0600 12/24/16 0338 12/25/16 0600  NA 136 135 134*  K 3.8 3.2* 3.6  CL 106 105 105  CO2 25 25 25   BUN 12 9 10   CREATININE 0.66 0.61 0.56  GLUCOSE 115* 108* 96     Electrolytes  Recent Labs Lab 12/22/16 0417 12/23/16 0600 12/24/16 0338 12/25/16 0600  CALCIUM 7.5* 7.5* 7.6* 7.6*  MG 1.9 1.7 1.6*  --   PHOS 2.8 2.6  2.5 2.7  --    CBC  Recent Labs Lab 12/23/16 0600 12/24/16 0338 12/25/16 0600  WBC 12.1* 10.5 8.7  HGB 7.4* 7.0* 6.6*  HCT 22.7* 21.0* 20.4*  PLT 366 372 369    Coag's  Recent Labs Lab 12/20/16 1615  INR 1.33    Sepsis Markers No results for input(s): LATICACIDVEN, PROCALCITON, O2SATVEN in the last 168 hours.  ABG  Recent Labs Lab 12/18/16 2346 12/19/16 0751 12/19/16 0950  PHART 7.477* 7.482* 7.448  PCO2ART 35.3 34.1 36.3  PO2ART 51.0* 61.0* 251*    Liver Enzymes  Recent Labs Lab 12/23/16 0600  ALBUMIN 1.4*    Cardiac Enzymes No results for input(s): TROPONINI, PROBNP in the last 168 hours.  Glucose  Recent Labs Lab 12/24/16 1953 12/24/16 2356 12/25/16 0337 12/25/16 0746 12/25/16 1117 12/25/16 1531  GLUCAP 97 92 100* 90 106* 96    Imaging No results found. 6/3 CXR images independently reviewed, improved aeration RLL  STUDIES:  5/26  CT head/neck angiogram >> no intracranial stenosis 5/27  CT abdomen >> free air, sigmoid colon swelling 5/28  ECHO >> Left ventricle:  The cavity size was normal. Wall thickness was normal. Systolic function was normal. The estimated ejection fraction was in the range of 50% to 55%. Features are consistent with a pseudonormal left ventricular filling pattern, with concomitant abnormal relaxation and increased filling pressure (grade 2 diastolic dysfunction).Right ventricle:  The cavity size was normal. Systolic function was mildly to moderately reduced. 6/02  CTA Chest >> small subsegmental PE, multi-lobar pneumonia 6/05  LUE ultrasound > no DVT but there are multiple segmnets of superficial vein thrombosis through the left upper extremity, left cephalic vein near subclavian vein appears thrombosed, portion of left cephalic near [facility] fossa is  thrombosed  CULTURES: Resp 5/27 >> klebsiella, GPC BCx2 5/26 >> negative JP fluid drainage 6/4 >> klebsiella (negative for ESBL) BC 6/8 > UC 6/8 > Yeast Resp cx (FOB) 6/8 >> rare Pseudomonas >>    ANTIBIOTICS: Vanc 5/26 > 5/29 Zosyn 5/26 > 5/29 Unasyn 5/29 > 6/2  Vanc 6/2 > 6/4 Zosyn 6/2 >>6/8  SIGNIFICANT EVENTS: 5/27  OR exlap, colostomy 5/31  Cardiac arrest post extubation 6/01  started pulse dose solumedrol for Solumedrol flare 6/02  arrested in am. PEA > NSVT. Cards called.  Small PE & multilobar pneumonia noted on CT angiogram 6/07  Trach placed/ f/b bronch  6/08  Left h  LINES/TUBES: ETT 5/26 > 5/30 ETT 5/30 >> 6/4 ETT 6/5 >> 6/7 5/26 left subclavian CVL> 6/1 6/1 R IJ CVL >>  DISCUSSION: 61 y/o female with MS baseline who had a perforated sigmoid colon leading to sepsis, encephalopathy, respiratory failure. Aspiration event with cardiac arrest on 5/31, re-intubated, bronched food removed.  Then bradycardia/PEA arrest on 6/2 early AM, possibly related to pneumonia/respiratory failure with hypoxemia (though on vent, unclear cause).  ASSESSMENT / PLAN:  PULMONARY A: Acute respiratory failure with hypoxemia due to HCAP, weakness Aspiration 5/31 PE - small HCAP/aspiration pneumonia, treated Right Pleural Effusion - failed thora 6/6 Right basilar atelectasis  Left hemithorax - 6/8 suspect from plugging, secretions have been very thick Tracheostomy  Pseudomonas in sputum 6/8, question colonizer versus pathogen but suspect recurrent HCAP  P:   Continue pressure support ventilation as she can tolerate She will be a prolonged ventilator wean and [facility] I would like to move her on 6/12 once I ensure that her hemoglobin is stable on Eliquis Chest PT Antibiotics as below   CARDIOVASCULAR A:  Septic shock. Random cortisol 14.6 Cardiac arrest x2 - initial aspiration, second episode due to PE+ PNA? Left arm swelling - neg  for DVT, +superficial thrombus 6/8 CVP 3 P:  Continue anticoagulation Telemetry   RENAL A:   AKI - resolved  Hypernatremia- resolved Hypokalemia Hypophosp Volume overload  P:   Follow BMP, urine output Replace electrolytes as indicated  GASTROINTESTINAL A:   Protein calorie malnutrition Sigmoid colon perforation S/p Ostomy  P:    Wound ostomy management, drain management per surgery plans Feeding tube in place 2 beats Protonix   HEMATOLOGIC A:   Anemia - minor bleeding at surgical wound, some from rectal stump - resolved  P:  Continue Eliquis Monitor for any evidence of overt bleeding Transfusion 6/11, 1 unit Repeat CBC p.m. 6/11  INFECTIOUS A:   Septic shock due to peritonitis > improving Klebsiella CAP Suspected HCAP > pseudomonas 6/9  P:   7 days Zosyn  complete 6/8 Pseudomonas is pansensitive, stop Cipro and vancomycin, continue Zosyn  ENDOCRINE A:   No acute issues P:   Sliding scale insulin  NEUROLOGIC A:   MS baseline - worse, has acute flare up; s/p pulse dose solumedrol (5/31 - 6/4) + critical illness P:   Continue physical therapy Minimize sedation as able Fentanyl when needed   FAMILY  - Updates: updated Barbara (HCPOA) 6/8   Independent critical care time is 31 minutes.   Baltazar Apo, MD, PhD 12/25/2016, 3:59 PM [facility] [phone] or if no answer [phone]

## 2016-12-26 ENCOUNTER — Inpatient Hospital Stay
Admission: RE | Admit: 2016-12-26 | Discharge: 2017-01-22 | Disposition: A | Discharge status: Skilled Nursing Facility | Payer: Medicare Other | Source: Ambulatory Visit | Attending: Internal Medicine | Admitting: Internal Medicine

## 2016-12-26 ENCOUNTER — Other Ambulatory Visit (HOSPITAL_COMMUNITY): Payer: Medicare Other

## 2016-12-26 DIAGNOSIS — Z9889 Other specified postprocedural states: Secondary | ICD-10-CM

## 2016-12-26 DIAGNOSIS — R0603 Acute respiratory distress: Secondary | ICD-10-CM

## 2016-12-26 DIAGNOSIS — J189 Pneumonia, unspecified organism: Secondary | ICD-10-CM

## 2016-12-26 DIAGNOSIS — Z9911 Dependence on respirator [ventilator] status: Secondary | ICD-10-CM

## 2016-12-26 DIAGNOSIS — R509 Fever, unspecified: Secondary | ICD-10-CM

## 2016-12-26 DIAGNOSIS — J9690 Respiratory failure, unspecified, unspecified whether with hypoxia or hypercapnia: Secondary | ICD-10-CM

## 2016-12-26 DIAGNOSIS — E8770 Fluid overload, unspecified: Secondary | ICD-10-CM

## 2016-12-26 DIAGNOSIS — R52 Pain, unspecified: Secondary | ICD-10-CM

## 2016-12-26 DIAGNOSIS — Z931 Gastrostomy status: Secondary | ICD-10-CM

## 2016-12-26 DIAGNOSIS — R188 Other ascites: Secondary | ICD-10-CM

## 2016-12-26 DIAGNOSIS — T17908A Unspecified foreign body in respiratory tract, part unspecified causing other injury, initial encounter: Secondary | ICD-10-CM

## 2016-12-26 DIAGNOSIS — Z431 Encounter for attention to gastrostomy: Secondary | ICD-10-CM

## 2016-12-26 LAB — GLUCOSE, CAPILLARY
GLUCOSE-CAPILLARY: 96 mg/dL (ref 65–99)
GLUCOSE-CAPILLARY: 112 mg/dL — AB (ref 65–99)
Glucose-Capillary: 92 mg/dL (ref 65–99)

## 2016-12-26 LAB — CULTURE, RESPIRATORY W GRAM STAIN

## 2016-12-26 LAB — URINALYSIS, ROUTINE W REFLEX MICROSCOPIC
Glucose, UA: 100 mg/dL — AB
Ketones, ur: 15 mg/dL — AB
NITRITE: POSITIVE — AB
PH: 6.5 (ref 5.0–8.0)
Protein, ur: 100 mg/dL — AB
SPECIFIC GRAVITY, URINE: 1.02 (ref 1.005–1.030)

## 2016-12-26 LAB — CULTURE, RESPIRATORY

## 2016-12-26 LAB — CBC
HCT: 25.3 % — ABNORMAL LOW (ref 36.0–46.0)
Hemoglobin: 8.4 g/dL — ABNORMAL LOW (ref 12.0–15.0)
MCH: 29.8 pg (ref 26.0–34.0)
MCHC: 33.2 g/dL (ref 30.0–36.0)
MCV: 89.7 fL (ref 78.0–100.0)
Platelets: 390 10*3/uL (ref 150–400)
RBC: 2.82 MIL/uL — ABNORMAL LOW (ref 3.87–5.11)
RDW: 17.3 % — AB (ref 11.5–15.5)
WBC: 10.2 10*3/uL (ref 4.0–10.5)

## 2016-12-26 LAB — URINALYSIS, MICROSCOPIC (REFLEX)

## 2016-12-26 LAB — MAGNESIUM: Magnesium: 1.8 mg/dL (ref 1.7–2.4)

## 2016-12-26 MED ORDER — FUROSEMIDE 10 MG/ML IJ SOLN: 40.0000 mg | Freq: Two times a day (BID) | INTRAMUSCULAR | 0 refills | Status: DC

## 2016-12-26 MED ORDER — CIPROFLOXACIN IN D5W 400 MG/200ML IV SOLN
400.0000 mg | Freq: Two times a day (BID) | INTRAVENOUS | Status: DC
  Administered 2016-12-26: 400 mg via INTRAVENOUS
  Filled 2016-12-26 (×2): qty 200

## 2016-12-26 MED ORDER — ALBUTEROL SULFATE (2.5 MG/3ML) 0.083% IN NEBU: 2.5000 mg | INHALATION_SOLUTION | Freq: Two times a day (BID) | RESPIRATORY_TRACT | 12 refills | Status: DC

## 2016-12-26 MED ORDER — GUAIFENESIN 100 MG/5ML PO SOLN: 5.0000 mL | Freq: Three times a day (TID) | ORAL | 0 refills | Status: DC

## 2016-12-26 MED ORDER — CIPROFLOXACIN IN D5W 400 MG/200ML IV SOLN: 400.0000 mg | Freq: Two times a day (BID) | INTRAVENOUS | Status: AC

## 2016-12-26 MED ORDER — ONDANSETRON HCL 4 MG/2ML IJ SOLN: 4.0000 mg | Freq: Four times a day (QID) | INTRAMUSCULAR | 0 refills | Status: DC | PRN

## 2016-12-26 MED ORDER — VITAL AF 1.2 CAL PO LIQD: 1000.0000 mL | ORAL | Status: DC

## 2016-12-26 MED ORDER — INSULIN ASPART 100 UNIT/ML ~~LOC~~ SOLN: 2.0000 [IU] | SUBCUTANEOUS | 11 refills | Status: DC

## 2016-12-26 MED ORDER — PANTOPRAZOLE SODIUM 40 MG PO PACK: 40.0000 mg | PACK | Freq: Every day | ORAL | Status: DC

## 2016-12-26 MED ORDER — APIXABAN 5 MG PO TABS: 10.0000 mg | ORAL_TABLET | Freq: Two times a day (BID) | ORAL | Status: DC

## 2016-12-26 MED ORDER — APIXABAN 5 MG PO TABS: 5.0000 mg | ORAL_TABLET | Freq: Two times a day (BID) | ORAL | Status: DC

## 2016-12-26 MED ORDER — ACETAMINOPHEN 650 MG RE SUPP: 650.0000 mg | Freq: Four times a day (QID) | RECTAL | 0 refills | Status: DC | PRN

## 2016-12-26 NOTE — Discharge Summary (Signed)
Physician Discharge Summary  Patient ID: Melissa Oconnell MRN: [id number] DOB/AGE: 03/01/1956 61 y.o.  Admit date: 12/09/2016 Discharge date: 12/26/2016  Problem List Principal Problem:   Left-sided weakness Active Problems:   Memory loss   Multiple sclerosis (HCC)   UTI (urinary tract infection)   Shock (HCC)   Diarrhea   Cardiac arrest, cause unspecified ([place])   Acute respiratory failure with hypoxia (HCC)   Status post PICC central line placement   Acute on chronic respiratory failure with hypoxemia (HCC)   Left arm swelling   Peritonitis (HCC)  HPI: This is a 61 year old lady with a history of breast cancer and multiple sclerosis who is brought in via EMS with report of code stroke. Reported is that patient was at home in her usual state of health with her daughter. The daughter had been with her and she had been at baseline. The daughter then heard a thud in the room and came in to find her altered mental status and left-sided deficits. EMS reports that she has not been able to move her left side and has been decreasing to the right. The breathing was tachypneic and appeared to be having difficulty breathing.  Review of records notes that patient was seen yesterday in urgent care complaining of constipation. She was prescribed mag citrate.  While undergoing CT head, she had a large BM, she has been also putting out coffee ground from her NGT. She has had 2 more bowel movements since then in ER  Per ER nurse christine at bedside, she was waking up and followed some commands for her.  CT head negative for acute findings.        Past Medical History:  Diagnosis Date  . Abnormality of gait   . Cancer ([place])    breast  . Memory loss   . Multiple sclerosis ([place])   . Neuromuscular disorder ([place])    multiple sclerosis  . Osteoporosis, unspecified 01/06/2014    Past Surgical History:  Procedure Laterality Date  . DIAGNOSTIC LAPAROSCOPY     "swallowed  something"  . MASTECTOMY Right   . MASTECTOMY W/ SENTINEL NODE BIOPSY Right 12/02/2013   Procedure: TOTAL MASTECTOMY WITH SENTINEL LYMPH NODE BIOPSY;  Surgeon: Adin Hector, MD;  Location: [facility];  Service: General;  Laterality: Right;  . TONSILLECTOMY      Hospital Course:  BRIEF SUMMARY:  61 y/o with MS, had perforated sigmoid colon, to OR, VDRF post-op and septic shock. Hospital course complicated by cardiac arrest x2 (aspiration, second with turning on 6/2).  Work up for cardiac arrest led to findings of PE.  She has failed extubation x2.    SUBJECTIVE:   Tolerating t collar  VENTILATOR SETTINGS: Vent Mode: PRVC FiO2 (%):  [40 %] 40 % Set Rate:  [16 bmp] 16 bmp Vt Set:  [500 mL] 500 mL PEEP:  [5 cmH20] 5 cmH20 Plateau Pressure:  [18 cmH20-21 cmH20] 21 cmH20  INTAKE / OUTPUT:  Intake/Output Summary (Last 24 hours) at 12/26/16 1011 Last data filed at 12/26/16 0800  Gross per 24 hour  Intake             1725 ml  Output             3295 ml  Net            -1570 ml   PHYSICAL EXAMINATION: General:  WNWD AAF on T-collar with out distress HEENT: T collar unremarkable, with sutures in place PSY:Nl affect Neuro: Nods ,  profound muscle weakness from MS CV: HSR PULM: even/non-labored, lungs bilaterally diminished ME:QAST, non-tender, bsx4 active , ostomy unremarable Extremities: warm/dry, + edema . Contracted lower ext, left arm with edema Skin: no rashes or lesions   Imaging No results found. 6/3 CXR images independently reviewed, improved aeration RLL  STUDIES:  5/26  CT head/neck angiogram >> no intracranial stenosis 5/27  CT abdomen >> free air, sigmoid colon swelling 5/28  ECHO >> Left ventricle: The cavity size was normal. Wall thickness was normal. Systolic function was normal. The estimated ejection fraction was in the range of 50% to 55%. Features are consistent with a pseudonormal left ventricular filling pattern, with concomitant abnormal  relaxation and increased filling pressure (grade 2 diastolic dysfunction).Right ventricle: The cavity size was normal. Systolic function was mildly to moderately reduced. 6/02  CTA Chest >> small subsegmental PE, multi-lobar pneumonia 6/05  LUE ultrasound > no DVT but there are multiple segmnets of superficial vein thrombosis through the left upper extremity, left cephalic vein near subclavian vein appears thrombosed, portion of left cephalic near [facility] fossa is thrombosed 6/12 dc to ssh.  CULTURES: Resp 5/27 >> klebsiella, GPC BCx2 5/26 >> negative JP fluid drainage 6/4 >> klebsiella (negative for ESBL) BC 6/8 >ngtd UC 6/8 > Yeast Resp cx (FOB) 6/8 >> rare Pseudomonas and klebsiella >> res pip-tazo SS cipro   ANTIBIOTICS: Vanc 5/26 > 5/29 Zosyn 5/26 > 5/29 Unasyn 5/29 > 6/2  Vanc 6/2 > 6/4 Zosyn 6/2 >>6/8 Zoysn 6/8 >>6/12 Cipro 6/12 >> for 8 days  SIGNIFICANT EVENTS: 5/27  OR exlap, colostomy 5/31  Cardiac arrest post extubation 6/01  started pulse dose solumedrol for Solumedrol flare 6/02  arrested in am. PEA > NSVT. Cards called.  Small PE & multilobar pneumonia noted on CT angiogram 6/5 UEDS left superficial thrombosis left cephalic vein 4/19  Trach placed/ f/b bronch    LINES/TUBES: ETT 5/26 > 5/30 ETT 5/30 >> 6/4 ETT 6/5 >> 6/7 5/26 left subclavian CVL> 6/1 6/1 R IJ CVL >>  DISCUSSION: 61 y/o female with MS baseline who had a perforated sigmoid colon leading to sepsis, encephalopathy, respiratory failure. Aspiration event with cardiac arrest on 5/31, re-intubated, bronched food removed.  Then bradycardia/PEA arrest on 6/2 early AM, possibly related to pneumonia/respiratory failure with hypoxemia (though on vent, unclear cause).  ASSESSMENT / PLAN:  PULMONARY A: Acute respiratory failure with hypoxemia due to HCAP, weakness Aspiration 5/31 PE - small HCAP/aspiration pneumonia, treated Right Pleural Effusion - failed thora 6/6 Right basilar atelectasis   Left hemithorax - 6/8 suspect from plugging, secretions have been very thick Tracheostomy  Pseudomonas in sputum 6/8, question colonizer versus pathogen but suspect recurrent HCAP  P:   Continue pressure support ventilation as she can tolerate She will be a prolonged ventilator wean and [facility] I would like to move her today on 6/12 now that HD stable. Chest PT Antibiotics as below   CARDIOVASCULAR A:  Septic shock. Random cortisol 14.6 Cardiac arrest x2 - initial aspiration, second episode due to PE+ PNA? Left arm swelling - neg for DVT, +superficial thrombus 6/8 CVP 3 P:  Continue anticoagulation Telemetry Tx to [facility]   RENAL Labs (Brief)       Lab Results  Component Value Date   CREATININE 0.56 12/25/2016   CREATININE 0.61 12/24/2016   CREATININE 0.66 12/23/2016   CREATININE 1.0 04/05/2016   CREATININE 0.9 10/04/2015   CREATININE 0.9 04/05/2015  Last Labs    Recent Labs Lab 12/23/16 0600 12/24/16 0338 12/25/16 0600  K 3.8 3.2* 3.6       Last Labs    Recent Labs Lab 12/23/16 0600 12/24/16 0338 12/25/16 0600  NA 136 135 134*      A:   AKI - resolved  Hypernatremia- resolved Hypokalemia Hypophosp Volume overload  P:   Follow BMP, urine output Replace electrolytes as indicated  GASTROINTESTINAL A:   Protein calorie malnutrition Sigmoid colon perforation S/p Ostomy  P:    Wound ostomy management, drain management per surgery plans Feeding tube in place Protonix If tolerated t -collar may be able to eat with trach but will need swallow study. Continue tube feed for now.   HEMATOLOGIC  Recent Labs (last 2 labs)    Recent Labs  12/25/16 0600 12/26/16 0430  HGB 6.6* 8.4*      A:   Anemia - minor bleeding at surgical wound, some from rectal stump - resolved  P:  Continue Eliquis Monitor for any evidence of overt bleeding Transfusion 6/11, 1  unit Repeat CBC as needeed  INFECTIOUS A:   Septic shock due to peritonitis > improving Klebsiella CAP Suspected HCAP > pseudomonas and Klebsiella 6/9. The Klebsiella is now resistant Zoysn and we will change to Cipro 6/12 for 8 more days stop date 6/18  P:      Zosyn dc 6/12 as resistant Klebsiella, pseudomonas SS cipro  Cipro 6/12>>  ENDOCRINE CBG (last 3)   Recent Labs (last 2 labs)    Recent Labs  12/25/16 2339 12/26/16 0331 12/26/16 0750  GLUCAP 104* 92 96       A:   No acute issues P:   Sliding scale insulin  NEUROLOGIC A:   MS baseline - worse, has acute flare up; s/p pulse dose solumedrol (5/31 - 6/4) + critical illness P:   Continue physical therapy Minimize sedation as able Fentanyl when needed     Labs at discharge Lab Results  Component Value Date   CREATININE 0.56 12/25/2016   BUN 10 12/25/2016   NA 134 (L) 12/25/2016   K 3.6 12/25/2016   CL 105 12/25/2016   CO2 25 12/25/2016   Lab Results  Component Value Date   WBC 10.2 12/26/2016   HGB 8.4 (L) 12/26/2016   HCT 25.3 (L) 12/26/2016   MCV 89.7 12/26/2016   PLT 390 12/26/2016   Lab Results  Component Value Date   ALT 33 12/16/2016   AST 32 12/16/2016   ALKPHOS 152 (H) 12/16/2016   BILITOT 0.3 12/16/2016   Lab Results  Component Value Date   INR 1.33 12/20/2016   INR 1.45 12/16/2016   INR 1.24 12/09/2016    Current radiology studies No results found.  Disposition:  01-Home or Self Care  Discharge Instructions    Ambulatory referral to Neurology    Complete by:  As directed    An appointment is requested in approximately: 2 weeks   Discharge to SNF when bed available    Complete by:  As directed      Allergies as of 12/26/2016   No Known Allergies     Medication List    STOP taking these medications   acetaminophen 325 MG tablet Commonly known as:  TYLENOL Replaced by:  acetaminophen 650 MG suppository   CALCIUM 600 + D 600-200 MG-UNIT  Tabs Generic drug:  Calcium Carb-Cholecalciferol   docusate sodium 100 MG capsule Commonly known as:  COLACE  donepezil 10 MG tablet Commonly known as:  ARICEPT   polyethylene glycol packet Commonly known as:  MIRALAX / GLYCOLAX     TAKE these medications   acetaminophen 650 MG suppository Commonly known as:  TYLENOL Place 1 suppository (650 mg total) rectally every 6 (six) hours as needed for fever. Replaces:  acetaminophen 325 MG tablet   albuterol (2.5 MG/3ML) 0.083% nebulizer solution Commonly known as:  PROVENTIL Take 3 mLs (2.5 mg total) by nebulization 2 (two) times daily.   apixaban 5 MG Tabs tablet Commonly known as:  ELIQUIS Take 2 tablets (10 mg total) by mouth 2 (two) times daily.   apixaban 5 MG Tabs tablet Commonly known as:  ELIQUIS Take 1 tablet (5 mg total) by mouth 2 (two) times daily. Start taking on:  12/31/2016   ciprofloxacin 400 MG/200ML Soln Commonly known as:  CIPRO Inject 200 mLs (400 mg total) into the vein every 12 (twelve) hours.   feeding supplement (VITAL AF 1.2 CAL) Liqd Place 1,000 mLs into feeding tube continuous. What changed:  how much to take  how to take this  when to take this   furosemide 10 MG/ML injection Commonly known as:  LASIX Inject 4 mLs (40 mg total) into the vein every 12 (twelve) hours.   guaiFENesin 100 MG/5ML Soln Commonly known as:  ROBITUSSIN Place 5 mLs (100 mg total) into feeding tube every 8 (eight) hours.   insulin aspart 100 UNIT/ML injection Commonly known as:  novoLOG Inject 2-6 Units into the skin every 4 (four) hours.   ondansetron 4 MG/2ML Soln injection Commonly known as:  ZOFRAN Inject 2 mLs (4 mg total) into the vein every 6 (six) hours as needed for nausea.   pantoprazole sodium 40 mg/20 mL Pack Commonly known as:  PROTONIX Place 20 mLs (40 mg total) into feeding tube daily.      Follow-up Information    Cornett, Marcello Moores, MD Follow up in 3 week(s).   Specialty:  General  Surgery Contact information: [address] [place] [address] [phone]            Discharged Condition: fair  Time spent on discharge greater than 40 minutes.  Vital signs at Discharge. Temp:  [97.7 F (36.5 C)-100.2 F (37.9 C)] 99.7 F (37.6 C) (06/12 0753) Pulse Rate:  [35-107] 85 (06/12 0926) Resp:  [15-39] 25 (06/12 0926) BP: (86-138)/(50-120) 97/70 (06/12 0926) SpO2:  [92 %-100 %] 100 % (06/12 0926) FiO2 (%):  [40 %] 40 % (06/12 0926) Weight:  [167 lb 15.9 oz (76.2 kg)] 167 lb 15.9 oz (76.2 kg) (06/12 0423) Office follow up Special Information or instructions.  [facility] will assume her care as of 12/27/06  Signed: Richardson Landry Minor ACNP Maryanna Shape PCCM Pager [phone] till 3 pm If no answer page [phone] 12/26/2016, 10:42 AM   Attending Note:  I have examined patient, reviewed labs, studies and notes. I have discussed the case with S Minor, and I agree with the data and plans as amended above. Patient tolerating ATC some of the day. [facility], finish 8 days abx for Klebsiella dn Pseudomonas sputum.  Baltazar Apo, MD, PhD 12/26/2016, 1:11 PM [facility] [phone] or if no answer [phone]

## 2016-12-26 NOTE — Progress Notes (Signed)
PULMONARY / CRITICAL CARE MEDICINE   Name: Melissa Oconnell MRN: [id number] DOB: Feb 15, 1956    ADMISSION DATE:  12/09/2016 CONSULTATION DATE:  12/09/2016   REFERRING MD:  Jeanell Sparrow  CHIEF COMPLAINT:  confusion  BRIEF SUMMARY:  61 y/o with MS, had perforated sigmoid colon, to OR, VDRF post-op and septic shock. Hospital course complicated by cardiac arrest x2 (aspiration, second with turning on 6/2).  Work up for cardiac arrest led to findings of PE.  She has failed extubation x2.    SUBJECTIVE:   Tolerating t collar   VITAL SIGNS: BP 97/70   Pulse 85   Temp 99.7 F (37.6 C) (Oral)   Resp (!) 25   Ht 5\' 8"  (1.727 m)   Wt 167 lb 15.9 oz (76.2 kg)   SpO2 100%   BMI 25.54 kg/m   HEMODYNAMICS: CVP:  [8 mmHg] 8 mmHg  VENTILATOR SETTINGS: Vent Mode: PRVC FiO2 (%):  [40 %] 40 % Set Rate:  [16 bmp] 16 bmp Vt Set:  [500 mL] 500 mL PEEP:  [5 cmH20] 5 cmH20 Plateau Pressure:  [18 cmH20-21 cmH20] 21 cmH20  INTAKE / OUTPUT:  Intake/Output Summary (Last 24 hours) at 12/26/16 1011 Last data filed at 12/26/16 0800  Gross per 24 hour  Intake             1725 ml  Output             3295 ml  Net            -1570 ml   PHYSICAL EXAMINATION: General:  WNWD AAF on T-collar with out distress HEENT: T collar unremarkable, with sutures in place PSY:Nl affect Neuro: Nods , profound muscle weakness from MS CV: HSR PULM: even/non-labored, lungs bilaterally diminished XB:LTJQ, non-tender, bsx4 active , ostomy unremarable Extremities: warm/dry, + edema . Contracted lower ext, left arm with edema Skin: no rashes or lesions    LABS:  BMET  Recent Labs Lab 12/23/16 0600 12/24/16 0338 12/25/16 0600  NA 136 135 134*  K 3.8 3.2* 3.6  CL 106 105 105  CO2 25 25 25   BUN 12 9 10   CREATININE 0.66 0.61 0.56  GLUCOSE 115* 108* 96    Electrolytes  Recent Labs Lab 12/22/16 0417 12/23/16 0600 12/24/16 0338 12/25/16 0600 12/26/16 0430  CALCIUM 7.5* 7.5* 7.6* 7.6*  --   MG 1.9 1.7 1.6*   --  1.8  PHOS 2.8 2.6  2.5 2.7  --   --    CBC  Recent Labs Lab 12/24/16 0338 12/25/16 0600 12/26/16 0430  WBC 10.5 8.7 10.2  HGB 7.0* 6.6* 8.4*  HCT 21.0* 20.4* 25.3*  PLT 372 369 390    Coag's  Recent Labs Lab 12/20/16 1615  INR 1.33    Sepsis Markers No results for input(s): LATICACIDVEN, PROCALCITON, O2SATVEN in the last 168 hours.  ABG No results for input(s): PHART, PCO2ART, PO2ART in the last 168 hours.  Liver Enzymes  Recent Labs Lab 12/23/16 0600  ALBUMIN 1.4*    Cardiac Enzymes No results for input(s): TROPONINI, PROBNP in the last 168 hours.  Glucose  Recent Labs Lab 12/25/16 1117 12/25/16 1531 12/25/16 1946 12/25/16 2339 12/26/16 0331 12/26/16 0750  GLUCAP 106* 96 106* 104* 92 96    Imaging No results found. 6/3 CXR images independently reviewed, improved aeration RLL  STUDIES:  5/26  CT head/neck angiogram >> no intracranial stenosis 5/27  CT abdomen >> free air, sigmoid colon swelling 5/28  ECHO >> Left  ventricle:  The cavity size was normal. Wall thickness was normal. Systolic function was normal. The estimated ejection fraction was in the range of 50% to 55%. Features are consistent with a pseudonormal left ventricular filling pattern, with concomitant abnormal relaxation and increased filling pressure (grade 2 diastolic dysfunction).Right ventricle:  The cavity size was normal. Systolic function was mildly to moderately reduced. 6/02  CTA Chest >> small subsegmental PE, multi-lobar pneumonia 6/05  LUE ultrasound > no DVT but there are multiple segmnets of superficial vein thrombosis through the left upper extremity, left cephalic vein near subclavian vein appears thrombosed, portion of left cephalic near [facility] fossa is thrombosed 6/12 dc to ssh.  CULTURES: Resp 5/27 >> klebsiella, GPC BCx2 5/26 >> negative JP fluid drainage 6/4 >> klebsiella (negative for ESBL) BC 6/8 >ngtd UC 6/8 > Yeast Resp cx (FOB) 6/8 >> rare Pseudomonas >>  res pip-tazo SS cipro   ANTIBIOTICS: Vanc 5/26 > 5/29 Zosyn 5/26 > 5/29 Unasyn 5/29 > 6/2  Vanc 6/2 > 6/4 Zosyn 6/2 >>6/8 Zoysn 6/8 >>6/12 Cipro 6/12 >> for 8 days  SIGNIFICANT EVENTS: 5/27  OR exlap, colostomy 5/31  Cardiac arrest post extubation 6/01  started pulse dose solumedrol for Solumedrol flare 6/02  arrested in am. PEA > NSVT. Cards called.  Small PE & multilobar pneumonia noted on CT angiogram 6/5 UEDS left superficial thrombosis left cephalic vein 8/34  Trach placed/ f/b bronch    LINES/TUBES: ETT 5/26 > 5/30 ETT 5/30 >> 6/4 ETT 6/5 >> 6/7 5/26 left subclavian CVL> 6/1 6/1 R IJ CVL >>  DISCUSSION: 61 y/o female with MS baseline who had a perforated sigmoid colon leading to sepsis, encephalopathy, respiratory failure. Aspiration event with cardiac arrest on 5/31, re-intubated, bronched food removed.  Then bradycardia/PEA arrest on 6/2 early AM, possibly related to pneumonia/respiratory failure with hypoxemia (though on vent, unclear cause).  ASSESSMENT / PLAN:  PULMONARY A: Acute respiratory failure with hypoxemia due to HCAP, weakness Aspiration 5/31 PE - small HCAP/aspiration pneumonia, treated Right Pleural Effusion - failed thora 6/6 Right basilar atelectasis  Left hemithorax - 6/8 suspect from plugging, secretions have been very thick Tracheostomy  Pseudomonas in sputum 6/8, question colonizer versus pathogen but suspect recurrent HCAP  P:   Continue pressure support ventilation as she can tolerate She will be a prolonged ventilator wean and [facility]. I would like to move her today on 6/12 now that HD stable. Chest PT Antibiotics as below   CARDIOVASCULAR A:  Septic shock. Random cortisol 14.6 Cardiac arrest x2 - initial aspiration, second episode due to PE+ PNA? Left arm swelling - neg for DVT, +superficial thrombus 6/8 CVP 3 P:  Continue anticoagulation Telemetry Tx to [facility]   RENAL Lab Results   Component Value Date   CREATININE 0.56 12/25/2016   CREATININE 0.61 12/24/2016   CREATININE 0.66 12/23/2016   CREATININE 1.0 04/05/2016   CREATININE 0.9 10/04/2015   CREATININE 0.9 04/05/2015    Recent Labs Lab 12/23/16 0600 12/24/16 0338 12/25/16 0600  K 3.8 3.2* 3.6     Recent Labs Lab 12/23/16 0600 12/24/16 0338 12/25/16 0600  NA 136 135 134*    A:   AKI - resolved  Hypernatremia- resolved Hypokalemia Hypophosp Volume overload  P:   Follow BMP, urine output Replace electrolytes as indicated  GASTROINTESTINAL A:   Protein calorie malnutrition Sigmoid colon perforation S/p Ostomy  P:    Wound ostomy management, drain management per surgery plans Feeding  tube in place Protonix If tolerated t -collar may be able to eat with trach but will need swallow study. Continue tube feed for now.   HEMATOLOGIC  Recent Labs  12/25/16 0600 12/26/16 0430  HGB 6.6* 8.4*    A:   Anemia - minor bleeding at surgical wound, some from rectal stump - resolved  P:  Continue Eliquis Monitor for any evidence of overt bleeding Transfusion 6/11, 1 unit Repeat CBC as needeed  INFECTIOUS A:   Septic shock due to peritonitis > improving Klebsiella CAP Suspected HCAP > pseudomonas 6/9 which is resistant Zoysn and we will change to Cipro 6/12 for 8 more days stop date 6/18  P:      Zosyn dc 6/12 as resistant pseudomonas SS cipro  Cipro 6/12>>  ENDOCRINE CBG (last 3)   Recent Labs  12/25/16 2339 12/26/16 0331 12/26/16 0750  GLUCAP 104* 92 96     A:   No acute issues P:   Sliding scale insulin  NEUROLOGIC A:   MS baseline - worse, has acute flare up; s/p pulse dose solumedrol (5/31 - 6/4) + critical illness P:   Continue physical therapy Minimize sedation as able Fentanyl when needed   FAMILY  - Updates: updated [name] [facility]un[facility] [place]) 6/8 -For transfer to [facility] today 12/26/16  Richardson Landry Minor ACNP Maryanna Shape PCCM Pager [phone] till 3 pm If no answer  page [phone] 12/26/2016, 10:11 AM

## 2016-12-26 NOTE — Progress Notes (Signed)
Note pt. Has DC orders for LTACH due to need for anticipated prolonged ventilator wean.  I will sign off.    [facility] Admissions Coordinator Cell [phone] Office [phone]

## 2016-12-26 NOTE — Care Management Note (Signed)
Case Management Note  Patient Details  Name: Melissa Oconnell MRN: [id number] Date of Birth: December 30, 1955  Subjective/Objective:   Pt admitted with Left side weakness - found to have bowel perf -  Now with wound vac and ostomy                   Action/Plan:  PTA lived with caregiver [name] [facility]) Per POA pt is semi independent with ADLs.  Pt extubated today.  PT eval ordered for pt due to additional medical complexity.    Expected Discharge Date:  12/26/16               Expected Discharge Plan:     In-House Referral:     Discharge planning Services  CM Consult  Post Acute Care Choice:    Choice offered to:     DME Arranged:    DME Agency:     HH Arranged:    HH Agency:     Status of Service:     If discussed at [employer], dates discussed:    Additional Comments: 12/26/2016  CM spoke with POA in depth over last couple of days - POA chose [facility] for [facility].  POA aware that pt will discharge to [facility] today  12/22/16 CM left voicemail for POA requesting call back regarding LTACH choice  12/21/16 Attending in agreement with [facility] referral.  Both LTACHs have officially offered pt bed.  CM spoke with Jeannett Senior and informed of referral, provided basic education of LTACH facility and also provided choice.  POA request that each facility contact her today via phone.   Discussed in LOS 12/21/16 - pt remains appropriate for continued stay.  Per physician advisor pt is appropriate for [facility] referral - referral given to both agencies during meeting.  CM will discuss with attending once bed offers are confirmed by agencies Maryclare Labrador, RN 12/26/2016, 11:16 AM

## 2016-12-27 ENCOUNTER — Other Ambulatory Visit (HOSPITAL_COMMUNITY): Payer: Medicare Other

## 2016-12-27 LAB — COMPREHENSIVE METABOLIC PANEL
ALK PHOS: 96 U/L (ref 38–126)
ALT: 37 U/L (ref 14–54)
ANION GAP: 8 (ref 5–15)
AST: 30 U/L (ref 15–41)
Albumin: 1.3 g/dL — ABNORMAL LOW (ref 3.5–5.0)
BILIRUBIN TOTAL: 0.5 mg/dL (ref 0.3–1.2)
BUN: 8 mg/dL (ref 6–20)
CALCIUM: 7.8 mg/dL — AB (ref 8.9–10.3)
CO2: 27 mmol/L (ref 22–32)
Chloride: 97 mmol/L — ABNORMAL LOW (ref 101–111)
Creatinine, Ser: 0.6 mg/dL (ref 0.44–1.00)
GFR calc non Af Amer: >60 mL/min (ref >60)
Glucose, Bld: 98 mg/dL (ref 65–99)
Potassium: 2.7 mmol/L — CL (ref 3.5–5.1)
SODIUM: 132 mmol/L — AB (ref 135–145)
TOTAL PROTEIN: 4.9 g/dL — AB (ref 6.5–8.1)

## 2016-12-27 LAB — CULTURE, BLOOD (ROUTINE X 2)
Culture: NO GROWTH
Culture: NO GROWTH
SPECIAL REQUESTS: ADEQUATE
SPECIAL REQUESTS: ADEQUATE

## 2016-12-27 LAB — URINE CULTURE: Culture: >=100000 — AB

## 2016-12-27 LAB — CBC WITH DIFFERENTIAL/PLATELET
BASOS ABS: 0 10*3/uL (ref 0.0–0.1)
Basophils Relative: 0 %
EOS ABS: 0 10*3/uL (ref 0.0–0.7)
Eosinophils Relative: 0 %
HCT: 22.9 % — ABNORMAL LOW (ref 36.0–46.0)
HEMOGLOBIN: 7.5 g/dL — AB (ref 12.0–15.0)
Lymphocytes Relative: 9 %
Lymphs Abs: 1 10*3/uL (ref 0.7–4.0)
MCH: 29.6 pg (ref 26.0–34.0)
MCHC: 32.8 g/dL (ref 30.0–36.0)
MCV: 90.5 fL (ref 78.0–100.0)
MONO ABS: 0.7 10*3/uL (ref 0.1–1.0)
Monocytes Relative: 7 %
NEUTROS ABS: 8.9 10*3/uL — AB (ref 1.7–7.7)
Neutrophils Relative %: 84 %
PLATELETS: 439 10*3/uL — AB (ref 150–400)
RBC: 2.53 MIL/uL — ABNORMAL LOW (ref 3.87–5.11)
RDW: 17.5 % — ABNORMAL HIGH (ref 11.5–15.5)
WBC: 10.6 10*3/uL — ABNORMAL HIGH (ref 4.0–10.5)

## 2016-12-27 LAB — TSH: TSH: 1.011 u[IU]/mL (ref 0.350–4.500)

## 2016-12-27 LAB — PROTIME-INR
INR: 1.41
PROTHROMBIN TIME: 17.4 s — AB (ref 11.4–15.2)

## 2016-12-27 LAB — MAGNESIUM: Magnesium: 1.6 mg/dL — ABNORMAL LOW (ref 1.7–2.4)

## 2016-12-27 LAB — PHOSPHORUS: PHOSPHORUS: 4 mg/dL (ref 2.5–4.6)

## 2016-12-27 MED ORDER — LIDOCAINE HCL 1 % IJ SOLN
INTRAMUSCULAR | Status: AC
  Filled 2016-12-27: qty 20

## 2016-12-28 LAB — CBC
HCT: 24.3 % — ABNORMAL LOW (ref 36.0–46.0)
HEMOGLOBIN: 7.9 g/dL — AB (ref 12.0–15.0)
MCH: 29.6 pg (ref 26.0–34.0)
MCHC: 32.5 g/dL (ref 30.0–36.0)
MCV: 91 fL (ref 78.0–100.0)
PLATELETS: 413 10*3/uL — AB (ref 150–400)
RBC: 2.67 MIL/uL — AB (ref 3.87–5.11)
RDW: 16.6 % — ABNORMAL HIGH (ref 11.5–15.5)
WBC: 9.8 10*3/uL (ref 4.0–10.5)

## 2016-12-28 LAB — BASIC METABOLIC PANEL
Anion gap: 7 (ref 5–15)
BUN: 8 mg/dL (ref 6–20)
CHLORIDE: 97 mmol/L — AB (ref 101–111)
CO2: 29 mmol/L (ref 22–32)
Calcium: 7.7 mg/dL — ABNORMAL LOW (ref 8.9–10.3)
Creatinine, Ser: 0.59 mg/dL (ref 0.44–1.00)
GFR calc Af Amer: >60 mL/min (ref >60)
GFR calc non Af Amer: >60 mL/min (ref >60)
GLUCOSE: 95 mg/dL (ref 65–99)
POTASSIUM: 3.2 mmol/L — AB (ref 3.5–5.1)
Sodium: 133 mmol/L — ABNORMAL LOW (ref 135–145)

## 2016-12-28 LAB — HEMOGLOBIN A1C
Hgb A1c MFr Bld: 5.5 % (ref 4.8–5.6)
Mean Plasma Glucose: 111 mg/dL

## 2016-12-28 LAB — PHOSPHORUS: Phosphorus: 3.4 mg/dL (ref 2.5–4.6)

## 2016-12-28 LAB — MAGNESIUM: Magnesium: 1.7 mg/dL (ref 1.7–2.4)

## 2016-12-29 ENCOUNTER — Institutional Professional Consult (permissible substitution) (HOSPITAL_COMMUNITY): Payer: Medicare Other

## 2016-12-29 LAB — BASIC METABOLIC PANEL
ANION GAP: 9 (ref 5–15)
BUN: 10 mg/dL (ref 6–20)
CALCIUM: 7.9 mg/dL — AB (ref 8.9–10.3)
CHLORIDE: 95 mmol/L — AB (ref 101–111)
CO2: 28 mmol/L (ref 22–32)
Creatinine, Ser: 0.62 mg/dL (ref 0.44–1.00)
GFR calc non Af Amer: >60 mL/min (ref >60)
Glucose, Bld: 107 mg/dL — ABNORMAL HIGH (ref 65–99)
POTASSIUM: 3.4 mmol/L — AB (ref 3.5–5.1)
Sodium: 132 mmol/L — ABNORMAL LOW (ref 135–145)

## 2016-12-29 LAB — TYPE AND SCREEN
ABO/RH(D): A POS
ANTIBODY SCREEN: POSITIVE
DAT, IGG: NEGATIVE
PT AG TYPE: POSITIVE
Unit division: 0
Unit division: 0

## 2016-12-29 LAB — CBC WITH DIFFERENTIAL/PLATELET
Basophils Absolute: 0 10*3/uL (ref 0.0–0.1)
Basophils Relative: 0 %
EOS PCT: 1 %
Eosinophils Absolute: 0.1 10*3/uL (ref 0.0–0.7)
HEMATOCRIT: 25 % — AB (ref 36.0–46.0)
HEMOGLOBIN: 7.9 g/dL — AB (ref 12.0–15.0)
LYMPHS PCT: 17 %
Lymphs Abs: 1.6 10*3/uL (ref 0.7–4.0)
MCH: 29 pg (ref 26.0–34.0)
MCHC: 31.6 g/dL (ref 30.0–36.0)
MCV: 91.9 fL (ref 78.0–100.0)
MONOS PCT: 5 %
Monocytes Absolute: 0.5 10*3/uL (ref 0.1–1.0)
NEUTROS PCT: 77 %
Neutro Abs: 7.4 10*3/uL (ref 1.7–7.7)
Platelets: 393 10*3/uL (ref 150–400)
RBC: 2.72 MIL/uL — ABNORMAL LOW (ref 3.87–5.11)
RDW: 16.2 % — AB (ref 11.5–15.5)
WBC: 9.6 10*3/uL (ref 4.0–10.5)

## 2016-12-29 LAB — BPAM RBC
Blood Product Expiration Date: 201806232359
Blood Product Expiration Date: 201806232359
ISSUE DATE / TIME: 201806120018
UNIT TYPE AND RH: 6200
Unit Type and Rh: 6200

## 2016-12-29 LAB — PHOSPHORUS: PHOSPHORUS: 3.3 mg/dL (ref 2.5–4.6)

## 2016-12-29 LAB — MAGNESIUM: Magnesium: 1.7 mg/dL (ref 1.7–2.4)

## 2016-12-30 LAB — BASIC METABOLIC PANEL
ANION GAP: 9 (ref 5–15)
BUN: 11 mg/dL (ref 6–20)
CALCIUM: 8.1 mg/dL — AB (ref 8.9–10.3)
CO2: 27 mmol/L (ref 22–32)
Chloride: 97 mmol/L — ABNORMAL LOW (ref 101–111)
Creatinine, Ser: 0.57 mg/dL (ref 0.44–1.00)
Glucose, Bld: 109 mg/dL — ABNORMAL HIGH (ref 65–99)
Potassium: 3.4 mmol/L — ABNORMAL LOW (ref 3.5–5.1)
Sodium: 133 mmol/L — ABNORMAL LOW (ref 135–145)

## 2016-12-30 LAB — CBC
HCT: 26.6 % — ABNORMAL LOW (ref 36.0–46.0)
Hemoglobin: 8.5 g/dL — ABNORMAL LOW (ref 12.0–15.0)
MCH: 29.8 pg (ref 26.0–34.0)
MCHC: 32 g/dL (ref 30.0–36.0)
MCV: 93.3 fL (ref 78.0–100.0)
PLATELETS: 409 10*3/uL — AB (ref 150–400)
RBC: 2.85 MIL/uL — ABNORMAL LOW (ref 3.87–5.11)
RDW: 15.8 % — ABNORMAL HIGH (ref 11.5–15.5)
WBC: 10.1 10*3/uL (ref 4.0–10.5)

## 2016-12-31 ENCOUNTER — Other Ambulatory Visit (HOSPITAL_COMMUNITY): Payer: Medicare Other

## 2016-12-31 MED ORDER — IOPAMIDOL (ISOVUE-300) INJECTION 61%
INTRAVENOUS | Status: AC
  Administered 2016-12-31: 75 mL via INTRAVENOUS
  Filled 2016-12-31: qty 100

## 2017-01-01 LAB — CBC
HCT: 22.9 % — ABNORMAL LOW (ref 36.0–46.0)
HCT: 22.8 % — ABNORMAL LOW (ref 36.0–46.0)
HCT: 27.5 % — ABNORMAL LOW (ref 36.0–46.0)
Hemoglobin: 7.2 g/dL — ABNORMAL LOW (ref 12.0–15.0)
Hemoglobin: 7.2 g/dL — ABNORMAL LOW (ref 12.0–15.0)
Hemoglobin: 8.7 g/dL — ABNORMAL LOW (ref 12.0–15.0)
MCH: 29.5 pg (ref 26.0–34.0)
MCH: 29.6 pg (ref 26.0–34.0)
MCH: 29.2 pg (ref 26.0–34.0)
MCHC: 31.4 g/dL (ref 30.0–36.0)
MCHC: 31.6 g/dL (ref 30.0–36.0)
MCHC: 31.6 g/dL (ref 30.0–36.0)
MCV: 93.9 fL (ref 78.0–100.0)
MCV: 93.8 fL (ref 78.0–100.0)
MCV: 92.3 fL (ref 78.0–100.0)
PLATELETS: 435 10*3/uL — AB (ref 150–400)
PLATELETS: 522 10*3/uL — AB (ref 150–400)
Platelets: 407 10*3/uL — ABNORMAL HIGH (ref 150–400)
RBC: 2.44 MIL/uL — ABNORMAL LOW (ref 3.87–5.11)
RBC: 2.43 MIL/uL — ABNORMAL LOW (ref 3.87–5.11)
RBC: 2.98 MIL/uL — AB (ref 3.87–5.11)
RDW: 15.7 % — AB (ref 11.5–15.5)
RDW: 15.6 % — AB (ref 11.5–15.5)
RDW: 15.2 % (ref 11.5–15.5)
WBC: 7.7 10*3/uL (ref 4.0–10.5)
WBC: 8.8 10*3/uL (ref 4.0–10.5)
WBC: 9.6 10*3/uL (ref 4.0–10.5)

## 2017-01-01 LAB — BASIC METABOLIC PANEL
ANION GAP: 8 (ref 5–15)
Anion gap: 6 (ref 5–15)
BUN: 12 mg/dL (ref 6–20)
BUN: 12 mg/dL (ref 6–20)
CALCIUM: 7.3 mg/dL — AB (ref 8.9–10.3)
CALCIUM: 8.3 mg/dL — AB (ref 8.9–10.3)
CO2: 21 mmol/L — ABNORMAL LOW (ref 22–32)
CO2: 27 mmol/L (ref 22–32)
CREATININE: 0.57 mg/dL (ref 0.44–1.00)
Chloride: 98 mmol/L — ABNORMAL LOW (ref 101–111)
Chloride: 100 mmol/L — ABNORMAL LOW (ref 101–111)
Creatinine, Ser: 0.53 mg/dL (ref 0.44–1.00)
GFR calc Af Amer: >60 mL/min (ref >60)
GFR calc non Af Amer: >60 mL/min (ref >60)
GLUCOSE: 549 mg/dL — AB (ref 65–99)
Glucose, Bld: 104 mg/dL — ABNORMAL HIGH (ref 65–99)
Potassium: 3.4 mmol/L — ABNORMAL LOW (ref 3.5–5.1)
Potassium: 3.4 mmol/L — ABNORMAL LOW (ref 3.5–5.1)
SODIUM: 127 mmol/L — AB (ref 135–145)
SODIUM: 133 mmol/L — AB (ref 135–145)

## 2017-01-01 NOTE — Progress Notes (Signed)
Dr. Barbie Banner has reviewed the patient's imaging and feels she has too much ascites in her abdomen for a g-tube right now.  She would need this to resolve and radiologic evidence of resolution before she would be a candidate for a g-tube.  This was called and discussed with Robb Matar, NP.  Melissa Oconnell 9:34 AM 01/01/2017

## 2017-01-02 LAB — BASIC METABOLIC PANEL
ANION GAP: 7 (ref 5–15)
BUN: 12 mg/dL (ref 6–20)
CHLORIDE: 102 mmol/L (ref 101–111)
CO2: 26 mmol/L (ref 22–32)
Calcium: 8.2 mg/dL — ABNORMAL LOW (ref 8.9–10.3)
Creatinine, Ser: 0.52 mg/dL (ref 0.44–1.00)
GFR calc Af Amer: >60 mL/min (ref >60)
GFR calc non Af Amer: >60 mL/min (ref >60)
Glucose, Bld: 104 mg/dL — ABNORMAL HIGH (ref 65–99)
POTASSIUM: 3.7 mmol/L (ref 3.5–5.1)
SODIUM: 135 mmol/L (ref 135–145)

## 2017-01-02 LAB — CULTURE, RESPIRATORY W GRAM STAIN

## 2017-01-02 LAB — CULTURE, RESPIRATORY

## 2017-01-03 ENCOUNTER — Other Ambulatory Visit (HOSPITAL_COMMUNITY): Payer: Medicare Other

## 2017-01-03 LAB — URINALYSIS, ROUTINE W REFLEX MICROSCOPIC
BILIRUBIN URINE: NEGATIVE
Glucose, UA: NEGATIVE mg/dL
Hgb urine dipstick: NEGATIVE
Ketones, ur: NEGATIVE mg/dL
Nitrite: NEGATIVE
Protein, ur: NEGATIVE mg/dL
SPECIFIC GRAVITY, URINE: 1.008 (ref 1.005–1.030)
Squamous Epithelial / LPF: NONE SEEN
pH: 6 (ref 5.0–8.0)

## 2017-01-03 MED ORDER — IOPAMIDOL (ISOVUE-300) INJECTION 61%
INTRAVENOUS | Status: AC
  Administered 2017-01-03: 100 mL
  Filled 2017-01-03: qty 30

## 2017-01-04 LAB — BASIC METABOLIC PANEL
ANION GAP: 6 (ref 5–15)
BUN: 10 mg/dL (ref 6–20)
CO2: 28 mmol/L (ref 22–32)
Calcium: 8.4 mg/dL — ABNORMAL LOW (ref 8.9–10.3)
Chloride: 99 mmol/L — ABNORMAL LOW (ref 101–111)
Creatinine, Ser: 0.47 mg/dL (ref 0.44–1.00)
GFR calc Af Amer: >60 mL/min (ref >60)
GFR calc non Af Amer: >60 mL/min (ref >60)
GLUCOSE: 98 mg/dL (ref 65–99)
POTASSIUM: 3.5 mmol/L (ref 3.5–5.1)
Sodium: 133 mmol/L — ABNORMAL LOW (ref 135–145)

## 2017-01-04 LAB — BLOOD GAS, ARTERIAL
Acid-Base Excess: 2.4 mmol/L — ABNORMAL HIGH (ref 0.0–2.0)
Bicarbonate: 25.9 mmol/L (ref 20.0–28.0)
FIO2: 28
LHR: 16 {breaths}/min
O2 Saturation: 97.6 %
PATIENT TEMPERATURE: 98.6
PEEP/CPAP: 5 cmH2O
PO2 ART: 93.2 mmHg (ref 83.0–108.0)
VT: 500 mL
pCO2 arterial: 36.5 mmHg (ref 32.0–48.0)
pH, Arterial: 7.465 — ABNORMAL HIGH (ref 7.350–7.450)

## 2017-01-04 LAB — CBC WITH DIFFERENTIAL/PLATELET
BASOS ABS: 0.1 10*3/uL (ref 0.0–0.1)
Basophils Relative: 1 %
Eosinophils Absolute: 0 10*3/uL (ref 0.0–0.7)
Eosinophils Relative: 0 %
HEMATOCRIT: 25.9 % — AB (ref 36.0–46.0)
Hemoglobin: 8.2 g/dL — ABNORMAL LOW (ref 12.0–15.0)
LYMPHS ABS: 1.7 10*3/uL (ref 0.7–4.0)
LYMPHS PCT: 17 %
MCH: 28.9 pg (ref 26.0–34.0)
MCHC: 31.7 g/dL (ref 30.0–36.0)
MCV: 91.2 fL (ref 78.0–100.0)
MONO ABS: 1.2 10*3/uL — AB (ref 0.1–1.0)
Monocytes Relative: 11 %
Neutro Abs: 7.3 10*3/uL (ref 1.7–7.7)
Neutrophils Relative %: 71 %
Platelets: 736 10*3/uL — ABNORMAL HIGH (ref 150–400)
RBC: 2.84 MIL/uL — AB (ref 3.87–5.11)
RDW: 15 % (ref 11.5–15.5)
WBC: 10.2 10*3/uL (ref 4.0–10.5)

## 2017-01-04 LAB — CULTURE, BLOOD (ROUTINE X 2)
CULTURE: NO GROWTH
Culture: NO GROWTH
Special Requests: ADEQUATE
Special Requests: ADEQUATE

## 2017-01-04 LAB — PHOSPHORUS: Phosphorus: 3.6 mg/dL (ref 2.5–4.6)

## 2017-01-04 LAB — PROCALCITONIN

## 2017-01-04 LAB — URINE CULTURE: Culture: NO GROWTH

## 2017-01-04 LAB — MAGNESIUM: Magnesium: 1.7 mg/dL (ref 1.7–2.4)

## 2017-01-05 ENCOUNTER — Institutional Professional Consult (permissible substitution) (HOSPITAL_COMMUNITY): Payer: Medicare Other

## 2017-01-05 ENCOUNTER — Other Ambulatory Visit (HOSPITAL_COMMUNITY): Payer: Medicare Other

## 2017-01-05 NOTE — Progress Notes (Signed)
Patient ID: SVETLANA BAGBY, female   DOB: 22-Jan-1956, 61 y.o.   MRN: [id number]   Request has been made for aspiration/drain of right adnexal fluid collection  CLINICAL DATA:  Abdominal pain and fever, history multiple sclerosis, breast cancer ; underwent exploratory laparotomy with sigmoid colectomy and Hartmann pouch on 12/10/2016.  CT: Focal fluid collection in the RIGHT pelvis, 5.3 x 3.4 x 5.6 cm, little changed from previous study, question RIGHT ovarian cystic lesion new since 12/10/2016 versus postoperative sterile versus infected collection. Central low attenuation within uterus, abnormal for age; followup pelvic sonography recommended to characterize, which will also allow for characterization of the RIGHT pelvic fluid collection to determine if it is origin.  [place]: Persistent right ovarian/ right adnexal complex fluid collection measuring 5.9 x 6.1 x 5.6 cm. No interim change from prior CT. Again this could represent a primary ovarian process. Infected adnexal fluid collection as previously noted could also present in this fashion.   Dr Laurence Ferrari has reviewed imaging and feels there is NO safe window to gain access to this collection.  Rec: 1) repeat US 6 weeks Or 2) seek GYN consultation/recommendation  Relayed information to MD in [facility]. He will discuss with Dr Laurence Ferrari if any further questions or concerns

## 2017-01-07 LAB — CULTURE, RESPIRATORY W GRAM STAIN

## 2017-01-07 LAB — CULTURE, RESPIRATORY

## 2017-01-08 LAB — CULTURE, BLOOD (ROUTINE X 2)
CULTURE: NO GROWTH
Culture: NO GROWTH
SPECIAL REQUESTS: ADEQUATE
SPECIAL REQUESTS: ADEQUATE

## 2017-01-08 LAB — CBC WITH DIFFERENTIAL/PLATELET
BASOS PCT: 1 %
Basophils Absolute: 0.1 10*3/uL (ref 0.0–0.1)
EOS PCT: 1 %
Eosinophils Absolute: 0.1 10*3/uL (ref 0.0–0.7)
HEMATOCRIT: 25.7 % — AB (ref 36.0–46.0)
Hemoglobin: 8 g/dL — ABNORMAL LOW (ref 12.0–15.0)
Lymphocytes Relative: 21 %
Lymphs Abs: 2.8 10*3/uL (ref 0.7–4.0)
MCH: 28.7 pg (ref 26.0–34.0)
MCHC: 31.1 g/dL (ref 30.0–36.0)
MCV: 92.1 fL (ref 78.0–100.0)
MONO ABS: 1.2 10*3/uL — AB (ref 0.1–1.0)
Monocytes Relative: 9 %
NEUTROS PCT: 68 %
Neutro Abs: 9 10*3/uL — ABNORMAL HIGH (ref 1.7–7.7)
Platelets: 843 10*3/uL — ABNORMAL HIGH (ref 150–400)
RBC: 2.79 MIL/uL — ABNORMAL LOW (ref 3.87–5.11)
RDW: 15.7 % — AB (ref 11.5–15.5)
WBC MORPHOLOGY: INCREASED
WBC: 13.2 10*3/uL — ABNORMAL HIGH (ref 4.0–10.5)

## 2017-01-09 LAB — CBC WITH DIFFERENTIAL/PLATELET
BASOS PCT: 1 %
Basophils Absolute: 0.1 10*3/uL (ref 0.0–0.1)
EOS ABS: 0.1 10*3/uL (ref 0.0–0.7)
Eosinophils Relative: 1 %
HCT: 27.4 % — ABNORMAL LOW (ref 36.0–46.0)
Hemoglobin: 8.5 g/dL — ABNORMAL LOW (ref 12.0–15.0)
Lymphocytes Relative: 20 %
Lymphs Abs: 2.3 10*3/uL (ref 0.7–4.0)
MCH: 28.3 pg (ref 26.0–34.0)
MCHC: 31 g/dL (ref 30.0–36.0)
MCV: 91.3 fL (ref 78.0–100.0)
MONO ABS: 1.3 10*3/uL — AB (ref 0.1–1.0)
Monocytes Relative: 11 %
NEUTROS ABS: 7.8 10*3/uL — AB (ref 1.7–7.7)
NEUTROS PCT: 67 %
PLATELETS: 675 10*3/uL — AB (ref 150–400)
RBC: 3 MIL/uL — ABNORMAL LOW (ref 3.87–5.11)
RDW: 15.4 % (ref 11.5–15.5)
WBC: 11.6 10*3/uL — ABNORMAL HIGH (ref 4.0–10.5)

## 2017-01-09 LAB — MAGNESIUM: MAGNESIUM: 1.7 mg/dL (ref 1.7–2.4)

## 2017-01-09 LAB — BASIC METABOLIC PANEL
Anion gap: 10 (ref 5–15)
BUN: 13 mg/dL (ref 6–20)
CO2: 24 mmol/L (ref 22–32)
CREATININE: 0.48 mg/dL (ref 0.44–1.00)
Calcium: 8.8 mg/dL — ABNORMAL LOW (ref 8.9–10.3)
Chloride: 100 mmol/L — ABNORMAL LOW (ref 101–111)
GFR calc Af Amer: >60 mL/min (ref >60)
Glucose, Bld: 115 mg/dL — ABNORMAL HIGH (ref 65–99)
Potassium: 3.9 mmol/L (ref 3.5–5.1)
SODIUM: 134 mmol/L — AB (ref 135–145)

## 2017-01-09 LAB — PHOSPHORUS: Phosphorus: 3.9 mg/dL (ref 2.5–4.6)

## 2017-01-12 LAB — CBC WITH DIFFERENTIAL/PLATELET
BASOS ABS: 0.2 10*3/uL — AB (ref 0.0–0.1)
BASOS PCT: 1 %
EOS PCT: 1 %
Eosinophils Absolute: 0.2 10*3/uL (ref 0.0–0.7)
HCT: 28.3 % — ABNORMAL LOW (ref 36.0–46.0)
Hemoglobin: 8.7 g/dL — ABNORMAL LOW (ref 12.0–15.0)
LYMPHS PCT: 27 %
Lymphs Abs: 3.3 10*3/uL (ref 0.7–4.0)
MCH: 28 pg (ref 26.0–34.0)
MCHC: 30.7 g/dL (ref 30.0–36.0)
MCV: 91 fL (ref 78.0–100.0)
MONOS PCT: 10 %
Monocytes Absolute: 1.3 10*3/uL — ABNORMAL HIGH (ref 0.1–1.0)
Neutro Abs: 7.2 10*3/uL (ref 1.7–7.7)
Neutrophils Relative %: 61 %
PLATELETS: 609 10*3/uL — AB (ref 150–400)
RBC: 3.11 MIL/uL — ABNORMAL LOW (ref 3.87–5.11)
RDW: 15.4 % (ref 11.5–15.5)
WBC: 12 10*3/uL — ABNORMAL HIGH (ref 4.0–10.5)

## 2017-01-12 LAB — BASIC METABOLIC PANEL
ANION GAP: 7 (ref 5–15)
BUN: 14 mg/dL (ref 6–20)
CALCIUM: 9.3 mg/dL (ref 8.9–10.3)
CO2: 29 mmol/L (ref 22–32)
CREATININE: 0.42 mg/dL — AB (ref 0.44–1.00)
Chloride: 98 mmol/L — ABNORMAL LOW (ref 101–111)
GFR calc Af Amer: >60 mL/min (ref >60)
GLUCOSE: 105 mg/dL — AB (ref 65–99)
Potassium: 3.8 mmol/L (ref 3.5–5.1)
Sodium: 134 mmol/L — ABNORMAL LOW (ref 135–145)

## 2017-01-14 ENCOUNTER — Other Ambulatory Visit (HOSPITAL_COMMUNITY): Payer: Medicare Other

## 2017-01-16 ENCOUNTER — Other Ambulatory Visit (HOSPITAL_COMMUNITY): Payer: Medicare Other

## 2017-01-16 LAB — CBC
HCT: 29.4 % — ABNORMAL LOW (ref 36.0–46.0)
Hemoglobin: 9 g/dL — ABNORMAL LOW (ref 12.0–15.0)
MCH: 28 pg (ref 26.0–34.0)
MCHC: 30.6 g/dL (ref 30.0–36.0)
MCV: 91.3 fL (ref 78.0–100.0)
PLATELETS: 458 10*3/uL — AB (ref 150–400)
RBC: 3.22 MIL/uL — ABNORMAL LOW (ref 3.87–5.11)
RDW: 15.5 % (ref 11.5–15.5)
WBC: 11.5 10*3/uL — ABNORMAL HIGH (ref 4.0–10.5)

## 2017-01-16 LAB — BASIC METABOLIC PANEL
Anion gap: 9 (ref 5–15)
BUN: 19 mg/dL (ref 6–20)
CALCIUM: 10.1 mg/dL (ref 8.9–10.3)
CO2: 27 mmol/L (ref 22–32)
Chloride: 99 mmol/L — ABNORMAL LOW (ref 101–111)
Creatinine, Ser: 0.44 mg/dL (ref 0.44–1.00)
GFR calc Af Amer: >60 mL/min (ref >60)
GLUCOSE: 124 mg/dL — AB (ref 65–99)
Potassium: 3.9 mmol/L (ref 3.5–5.1)
Sodium: 135 mmol/L (ref 135–145)

## 2017-01-26 ENCOUNTER — Encounter (HOSPITAL_COMMUNITY): Payer: Self-pay

## 2017-01-26 ENCOUNTER — Observation Stay (HOSPITAL_COMMUNITY)
Admission: EM | Admit: 2017-01-26 | Discharge: 2017-01-27 | Disposition: A | Discharge status: Skilled Nursing Facility | Payer: No Typology Code available for payment source | Attending: Internal Medicine | Admitting: Internal Medicine

## 2017-01-26 ENCOUNTER — Emergency Department (HOSPITAL_COMMUNITY): Payer: No Typology Code available for payment source

## 2017-01-26 DIAGNOSIS — D649 Anemia, unspecified: Secondary | ICD-10-CM | POA: Insufficient documentation

## 2017-01-26 DIAGNOSIS — Z794 Long term (current) use of insulin: Secondary | ICD-10-CM | POA: Insufficient documentation

## 2017-01-26 DIAGNOSIS — M810 Age-related osteoporosis without current pathological fracture: Secondary | ICD-10-CM | POA: Diagnosis not present

## 2017-01-26 DIAGNOSIS — Z9011 Acquired absence of right breast and nipple: Secondary | ICD-10-CM | POA: Insufficient documentation

## 2017-01-26 DIAGNOSIS — G35 Multiple sclerosis: Secondary | ICD-10-CM | POA: Diagnosis not present

## 2017-01-26 DIAGNOSIS — F0390 Unspecified dementia without behavioral disturbance: Secondary | ICD-10-CM | POA: Insufficient documentation

## 2017-01-26 DIAGNOSIS — Z87891 Personal history of nicotine dependence: Secondary | ICD-10-CM | POA: Insufficient documentation

## 2017-01-26 DIAGNOSIS — Z79899 Other long term (current) drug therapy: Secondary | ICD-10-CM | POA: Diagnosis not present

## 2017-01-26 DIAGNOSIS — Z853 Personal history of malignant neoplasm of breast: Secondary | ICD-10-CM | POA: Diagnosis not present

## 2017-01-26 DIAGNOSIS — Z7901 Long term (current) use of anticoagulants: Secondary | ICD-10-CM | POA: Insufficient documentation

## 2017-01-26 DIAGNOSIS — E871 Hypo-osmolality and hyponatremia: Secondary | ICD-10-CM | POA: Insufficient documentation

## 2017-01-26 DIAGNOSIS — J9500 Unspecified tracheostomy complication: Secondary | ICD-10-CM | POA: Diagnosis present

## 2017-01-26 LAB — CBC WITH DIFFERENTIAL/PLATELET
Basophils Absolute: 0.1 10*3/uL (ref 0.0–0.1)
Basophils Relative: 1 %
EOS ABS: 0.6 10*3/uL (ref 0.0–0.7)
EOS PCT: 6 %
HCT: 28.5 % — ABNORMAL LOW (ref 36.0–46.0)
HEMOGLOBIN: 8.8 g/dL — AB (ref 12.0–15.0)
LYMPHS ABS: 3.2 10*3/uL (ref 0.7–4.0)
Lymphocytes Relative: 35 %
MCH: 27.8 pg (ref 26.0–34.0)
MCHC: 30.9 g/dL (ref 30.0–36.0)
MCV: 90.2 fL (ref 78.0–100.0)
MONOS PCT: 11 %
Monocytes Absolute: 1 10*3/uL (ref 0.1–1.0)
Neutro Abs: 4.3 10*3/uL (ref 1.7–7.7)
Neutrophils Relative %: 47 %
Platelets: 385 10*3/uL (ref 150–400)
RBC: 3.16 MIL/uL — AB (ref 3.87–5.11)
RDW: 15.9 % — ABNORMAL HIGH (ref 11.5–15.5)
WBC: 9.1 10*3/uL (ref 4.0–10.5)

## 2017-01-26 LAB — BASIC METABOLIC PANEL
Anion gap: 9 (ref 5–15)
BUN: 11 mg/dL (ref 6–20)
CHLORIDE: 96 mmol/L — AB (ref 101–111)
CO2: 28 mmol/L (ref 22–32)
CREATININE: 0.59 mg/dL (ref 0.44–1.00)
Calcium: 9.6 mg/dL (ref 8.9–10.3)
GFR calc Af Amer: >60 mL/min (ref >60)
GFR calc non Af Amer: >60 mL/min (ref >60)
GLUCOSE: 103 mg/dL — AB (ref 65–99)
POTASSIUM: 3.6 mmol/L (ref 3.5–5.1)
SODIUM: 133 mmol/L — AB (ref 135–145)

## 2017-01-26 MED ORDER — GUAIFENESIN 100 MG/5ML PO SOLN
5.0000 mL | Freq: Three times a day (TID) | ORAL | Status: DC
  Administered 2017-01-27: 100 mg via ORAL
  Filled 2017-01-26: qty 25
  Filled 2017-01-26: qty 5

## 2017-01-26 MED ORDER — GUAIFENESIN 100 MG/5ML PO SOLN: 5.0000 mL | Freq: Three times a day (TID) | ORAL | Status: DC

## 2017-01-26 MED ORDER — APIXABAN 5 MG PO TABS
5.0000 mg | ORAL_TABLET | Freq: Two times a day (BID) | ORAL | Status: DC
  Administered 2017-01-26 – 2017-01-27 (×2): 5 mg via ORAL
  Filled 2017-01-26 (×2): qty 1

## 2017-01-26 MED ORDER — JEVITY 1.2 CAL PO LIQD: 1000.0000 mL | ORAL | Status: DC

## 2017-01-26 MED ORDER — ALBUTEROL SULFATE (2.5 MG/3ML) 0.083% IN NEBU
2.5000 mg | INHALATION_SOLUTION | Freq: Two times a day (BID) | RESPIRATORY_TRACT | Status: DC
  Administered 2017-01-27: 2.5 mg via RESPIRATORY_TRACT
  Filled 2017-01-26: qty 3

## 2017-01-26 MED ORDER — ONDANSETRON HCL 4 MG/2ML IJ SOLN: 4.0000 mg | Freq: Four times a day (QID) | INTRAMUSCULAR | Status: DC | PRN

## 2017-01-26 MED ORDER — VITAL AF 1.2 CAL PO LIQD: 1000.0000 mL | ORAL | Status: DC

## 2017-01-26 MED ORDER — ACETAMINOPHEN 650 MG RE SUPP: 650.0000 mg | Freq: Four times a day (QID) | RECTAL | Status: DC | PRN

## 2017-01-26 MED ORDER — PANTOPRAZOLE SODIUM 40 MG PO TBEC
40.0000 mg | DELAYED_RELEASE_TABLET | Freq: Every day | ORAL | Status: DC
  Administered 2017-01-27: 40 mg via ORAL
  Filled 2017-01-26: qty 1

## 2017-01-26 MED ORDER — ACETAMINOPHEN 325 MG PO TABS: 650.0000 mg | ORAL_TABLET | Freq: Four times a day (QID) | ORAL | Status: DC | PRN

## 2017-01-26 MED ORDER — PANTOPRAZOLE SODIUM 40 MG PO PACK: 40.0000 mg | PACK | Freq: Every day | ORAL | Status: DC

## 2017-01-26 NOTE — ED Provider Notes (Signed)
[facility] DEPT Provider Note   CSN: [id number] Arrival date & time: 01/26/17  6712   By signing my name below, I, Eunice Blase, attest that this documentation has been prepared under the direction and in the presence of Dorie Rank, MD. Electronically signed, Eunice Blase, ED Scribe. 01/26/17. 7:08 PM.   History   Chief Complaint Chief Complaint  Patient presents with  . Tracheostomy Tube Change   The history is provided by the patient, medical records and the EMS personnel. No language interpreter was used.    MAIRLYN TEGTMEYER is a 61 y.o. female with h/o cancer BIB EMS to the Emergency Department after her trach was removed ~10 AM today. She states it was attached to a cord that got pulled. Trach placed last 12/2016; she states she was told that the plan was to remove it at some point ~02/2017. No SOB noted.  Past Medical History:  Diagnosis Date  . Abnormality of gait   . Cancer ([place])    breast  . Memory loss   . Multiple sclerosis ([place])   . Neuromuscular disorder ([place])    multiple sclerosis  . Osteoporosis, unspecified 01/06/2014    Patient Active Problem List   Diagnosis Date Noted  . Peritonitis ([place])   . Acute on chronic respiratory failure with hypoxemia ([place])   . Left arm swelling   . Status post PICC central line placement   . Acute respiratory failure with hypoxia ([place])   . Cardiac arrest, cause unspecified ([place])   . Shock ([place]) 12/09/2016  . Left-sided weakness 12/09/2016  . Diarrhea 12/09/2016  . Preventive measure 04/05/2016  . Goals of care, counseling/discussion 04/05/2016  . Orthostatic hypotension 12/29/2014  . UTI (urinary tract infection) 12/29/2014  . Incisional pain 02/03/2014  . Osteoporosis 01/06/2014  . Breast cancer of upper-outer quadrant of right female breast ([place]) 12/02/2013  . Multiple sclerosis ([place]) 12/10/2012  . Memory loss 12/03/2012  . Accidental Fall 12/03/2012  . Gait Disturbance  12/03/2012    Past Surgical History:    Procedure Laterality Date  . DIAGNOSTIC LAPAROSCOPY     "swallowed something"  . LAPAROTOMY N/A 12/10/2016   Procedure: EXPLORATORY LAPAROTOMY WITH SIGMOID COLECTOMY ANT HARTMAN'S POUCH;  Surgeon: Erroll Luna, MD;  Location: [facility];  Service: General;  Laterality: N/A;  . MASTECTOMY Right   . MASTECTOMY W/ SENTINEL NODE BIOPSY Right 12/02/2013   Procedure: TOTAL MASTECTOMY WITH SENTINEL LYMPH NODE BIOPSY;  Surgeon: Adin Hector, MD;  Location: [facility];  Service: General;  Laterality: Right;  . TONSILLECTOMY      OB History    No data available       Home Medications    Prior to Admission medications   Medication Sig Start Date End Date Taking? Authorizing Provider  acetaminophen (TYLENOL) 650 MG suppository Place 1 suppository (650 mg total) rectally every 6 (six) hours as needed for fever. 12/26/16  Yes Minor, Grace Bushy, NP  albuterol (PROVENTIL) (2.5 MG/3ML) 0.083% nebulizer solution Take 3 mLs (2.5 mg total) by nebulization 2 (two) times daily. 12/26/16  Yes Minor, Grace Bushy, NP  apixaban (ELIQUIS) 5 MG TABS tablet Take 1 tablet (5 mg total) by mouth 2 (two) times daily. 12/31/16  Yes Minor, Grace Bushy, NP  guaiFENesin (ROBITUSSIN) 100 MG/5ML SOLN Place 5 mLs (100 mg total) into feeding tube every 8 (eight) hours. 12/26/16  Yes Minor, Grace Bushy, NP  insulin aspart (NOVOLOG) 100 UNIT/ML injection Inject 2-6 Units into the skin every 4 (four) hours.  12/26/16  Yes Minor, Grace Bushy, NP  Nutritional Supplements (FEEDING SUPPLEMENT, VITAL AF 1.2 CAL,) LIQD Place 1,000 mLs into feeding tube continuous. 12/26/16  Yes Minor, Grace Bushy, NP  ondansetron (ZOFRAN) 4 MG/2ML SOLN injection Inject 2 mLs (4 mg total) into the vein every 6 (six) hours as needed for nausea. 12/26/16  Yes Minor, Grace Bushy, NP  pantoprazole sodium (PROTONIX) 40 mg/20 mL PACK Place 20 mLs (40 mg total) into feeding tube daily. 12/26/16  Yes Minor, Grace Bushy, NP  apixaban (ELIQUIS) 5 MG TABS tablet Take 2 tablets (10 mg total)  by mouth 2 (two) times daily. Patient not taking: Reported on 01/26/2017 12/26/16   Minor, Grace Bushy, NP  furosemide (LASIX) 10 MG/ML injection Inject 4 mLs (40 mg total) into the vein every 12 (twelve) hours. Patient not taking: Reported on 01/26/2017 12/26/16   Minor, Grace Bushy, NP    Family History Family History  Problem Relation Age of Onset  . Cancer Mother        breast    Social History Social History  Substance Use Topics  . Smoking status: Former Smoker    Packs/day: 1.00    Years: 5.00    Types: Cigarettes  . Smokeless tobacco: Never Used     Comment: "quit smoking cigarettes in 1999"  . Alcohol use Yes     Allergies   Patient has no known allergies.   Review of Systems Review of Systems  Respiratory: Negative for shortness of breath.   Skin: Positive for wound.  All other systems reviewed and are negative.    Physical Exam Updated Vital Signs BP 101/60 (BP Location: Right Arm)   Pulse 83   Temp 98.6 F (37 C) (Oral)   Resp 16   SpO2 100%   Physical Exam  Constitutional: No distress.  HENT:  Head: Normocephalic and atraumatic.  Right Ear: External ear normal.  Left Ear: External ear normal.  Eyes: Conjunctivae are normal. Right eye exhibits no discharge. Left eye exhibits no discharge. No scleral icterus.  Neck: Neck supple. No tracheal deviation present.  S/p tracheotomy. Granulation tissue noted to tracheostomy site. Tract not visible.   Cardiovascular: Normal rate.   Pulmonary/Chest: Effort normal. No stridor. No respiratory distress.  Abdominal: She exhibits no distension. There is tenderness (over healing surgical wound).  Musculoskeletal: She exhibits no edema.  Neurological: She is alert. Cranial nerve deficit: no gross deficits.  Awake. Answers questions appropriately. Generalized weakness.  Skin: Skin is warm and dry. No rash noted. She is not diaphoretic.  Psychiatric: She has a normal mood and affect.  Nursing note and vitals  reviewed.    ED Treatments / Results  DIAGNOSTIC STUDIES: Oxygen Saturation is 100% on RA, NL by my interpretation.    COORDINATION OF CARE: 7:03 PM-Discussed next steps with pt. Pt verbalized understanding and is agreeable with the plan. Will order imaging and consult pulmonary specialist.   Labs (all labs ordered are listed, but only abnormal results are displayed) Labs Reviewed  CBC WITH DIFFERENTIAL/PLATELET - Abnormal; Notable for the following:       Result Value   RBC 3.16 (*)    Hemoglobin 8.8 (*)    HCT 28.5 (*)    RDW 15.9 (*)    All other components within normal limits  BASIC METABOLIC PANEL - Abnormal; Notable for the following:    Sodium 133 (*)    Chloride 96 (*)    Glucose, Bld 103 (*)    All other  components within normal limits    EKG  EKG Interpretation None       Radiology Dg Chest 2 View  Result Date: 01/26/2017 CLINICAL DATA:  Tracheostomy tube removed EXAM: CHEST  2 VIEW COMPARISON:  01/14/2017 chest radiograph. FINDINGS: Left rotated chest radiograph. Surgical clips overlie the right axilla. Stable cardiomediastinal silhouette with mild cardiomegaly. No pneumothorax. No pleural effusion. Left lung base opacity is significantly decreased. No pulmonary edema. IMPRESSION: Significantly decreased left lung base opacity, favoring improved atelectasis . Stable mild cardiomegaly without pulmonary edema. Electronically Signed   By: Ilona Sorrel M.D.   On: 01/26/2017 20:25    Procedures Procedures (including critical care time)  Medications Ordered in ED Medications - No data to display   Initial Impression / Assessment and Plan / ED Course  I have reviewed the triage vital signs and the nursing notes.  Pertinent labs & imaging results that were available during my care of the patient were reviewed by me and considered in my medical decision making (see chart for details).  Clinical Course as of Jan 26 2100  Fri Jan 26, 2017  1950 Discussed case  with Dr Halford Chessman.  NO need to replace her trach now if she is breathing well and there is no tract to easily replace.  Would benefit from being observed overnight.  [JK]    Clinical Course User Index [JK] Dorie Rank, MD    Patient was monitored in the emergency room after her tracheostomy accidentally getting pulled out this morning. Patient remained stable in the emergency room. No respiratory difficulties. Checks to x-ray and laboratory tests are reassuring. I consult with pulmonary critical care. They recommended overnight observation no indication for repeat tracheostomy at this time.  Final Clinical Impressions(s) / ED Diagnoses   Final diagnoses:  Complication of tracheostomy tube ([place])   I personally performed the services described in this documentation, which was scribed in my presence.  The recorded information has been reviewed and is accurate.    Dorie Rank, MD 01/26/17 2102

## 2017-01-26 NOTE — ED Triage Notes (Signed)
Pt. Coming from [facility] for trach being pulled out at 10am. Pt. Not in respiratory distress. Some blood and tissue noted to trach area. EDP at bedside. Pt. Oxygen saturationa nd RR WDL. Pt. States she feels like she's breathing out of her nose.

## 2017-01-27 ENCOUNTER — Encounter (HOSPITAL_COMMUNITY): Payer: Self-pay | Admitting: General Practice

## 2017-01-27 DIAGNOSIS — Z430 Encounter for attention to tracheostomy: Secondary | ICD-10-CM

## 2017-01-27 LAB — GLUCOSE, CAPILLARY
GLUCOSE-CAPILLARY: 88 mg/dL (ref 65–99)
GLUCOSE-CAPILLARY: 94 mg/dL (ref 65–99)
Glucose-Capillary: 95 mg/dL (ref 65–99)

## 2017-01-27 LAB — MRSA PCR SCREENING: MRSA BY PCR: NEGATIVE

## 2017-01-27 MED ORDER — GUAIFENESIN 100 MG/5ML PO SOLN: 5.0000 mL | Freq: Three times a day (TID) | ORAL | 0 refills | Status: DC

## 2017-01-27 MED ORDER — ALBUTEROL SULFATE (2.5 MG/3ML) 0.083% IN NEBU: 2.5000 mg | INHALATION_SOLUTION | Freq: Two times a day (BID) | RESPIRATORY_TRACT | 12 refills | Status: DC

## 2017-01-27 MED ORDER — ONDANSETRON HCL 4 MG/2ML IJ SOLN: 4.0000 mg | Freq: Four times a day (QID) | INTRAMUSCULAR | 0 refills | Status: DC | PRN

## 2017-01-27 NOTE — Social Work (Signed)
CSW received call from Nurse on floor. Pt ready to return to [facility]. Pt came to ED last night due to trach coming out. Pt in observation status and can return to facility.  CSW called and confirmed with Santiago Glad in admissions.   CSW will set up transport.  Clinical Social Worker will sign off for now as social work intervention is no longer needed. Please consult [place] again if new need arises.  Elissa Hefty, LCSW Clinical Social Worker [phone]

## 2017-01-27 NOTE — Progress Notes (Signed)
Notified Mardene Celeste, with Social Work of discharge orders.

## 2017-01-27 NOTE — Progress Notes (Signed)
Patient ID: Melissa Oconnell, female   DOB: 11/06/1955, 61 y.o.   MRN: [id number]   Subjective: Melissa Oconnell is feeling well this morning. Melissa Oconnell was seen while enjoying breakfast and states Melissa Oconnell slept well overnight. Melissa Oconnell has no complaints at this time and has had no pain nor difficulty breathing following her accident trach decannulation. Melissa Oconnell is oriented to person, place and situation, but not time.   Objective:  Vital signs in last 24 hours: Vitals:   01/26/17 2200 01/26/17 2246 01/27/17 0550 01/27/17 0841  BP: 99/62 106/64 104/62   Pulse: 79 83 77   Resp:  18 18   Temp:  98.6 F (37 C) 99.7 F (37.6 C)   TempSrc:  Oral Oral   SpO2: 100% 100% 100% 98%  Weight:  106 lb (48.1 kg) 112 lb (50.8 kg)   Height:  6' (1.829 m)     Physical Exam  Constitutional: Melissa Oconnell appears well-developed. No distress.  Thin female  HENT:  Head: Normocephalic and atraumatic.  Eyes: EOM are normal. No scleral icterus.  Neck: No tracheal deviation present.  Skin around stoma clean and dry, granulation tissue present, but no tract visualized  Cardiovascular: Normal rate, regular rhythm and normal heart sounds.  Exam reveals no gallop and no friction rub.   No murmur heard. Pulmonary/Chest: Effort normal and breath sounds normal. No respiratory distress. Melissa Oconnell has no wheezes. Melissa Oconnell has no rales.  Abdominal: Soft. Bowel sounds are normal. Melissa Oconnell exhibits no distension. There is no tenderness.  Musculoskeletal: Melissa Oconnell exhibits no edema or deformity.  Neurological: Melissa Oconnell is alert.  Oriented to Person, place and context. Not oriented to time  Skin: Skin is warm and dry.     Assessment/Plan:  Melissa Oconnell is a 61 y.o. female with history of MS and breast cancer who presents for observation and monitoring of respiratory status after unintentional removal of tracheostomy tube.   Tracheostomy complication: Patient currently in no respiratory distress and satting well on room air. CXR clear.  - Continuous pulse ox  -  Albuterol nebs BID  - PO Robitussin 85m TID - Care management consult   Hx of sigmoid colon perforation s/p exploratory laparotomy with sigmoid colectomy and formation of colostomy: PEG tube in place. Ostomy bag with yellowish output.   - WOC consult: Dressing procedure/placement/frequency:  - Cleanse abdominal wound with NS and pat dry.  - Apply NS moist gauze to abdominal wound daily.  - Cover with ABD pad and tape.   Multiple sclerosis: Patient no longer able to ambulate and with progressive dementia.  - Per neurology note 11/16/2016, patient is not a good candidate for immunomodulation therapy.   Hx of breast cancer: ER positive, PR and Her2/neu negative, Stage IIA per oncology note 04/05/2016. Patient is no longer on adjuvant antiestrogen therapy given worsening bone density.     IVF: none  Diet: thin  DVT ppx: home Eliquis  Code status: Full code, confirmed on admission   Dispo: Anticipated discharge to SNF Today  ANeva Seat DO IM PGY-1 Pager: [phone]

## 2017-01-27 NOTE — Progress Notes (Addendum)
Called report to [place], Therapist, sports at [facility]. Patient will transport back there today by ambulance via [place].  Notified patient's daughter, Melissa Oconnell that patient would be going back to [facility].

## 2017-01-27 NOTE — H&P (Signed)
Date: 01/27/2017               Patient Name:  Melissa Oconnell MRN: [id number]  DOB: 10-27-1955 Age / Sex: 61 y.o., female   PCP: Care, [facility]         Medical Service: Internal Medicine Teaching Service         Attending Physician: Dr. Bartholomew Crews, MD    First Contact: Dr. Trilby Drummer Pager: [phone]  Second Contact: Dr. Benjamine Mola Pager: [phone]       After Hours (After 5p/  First Contact Pager: [phone]  weekends / holidays): Second Contact Pager: [phone]   Chief Complaint: Tracheostomy complications   History of Present Illness: Melissa Oconnell is a 61 y.o. female with history of MS and breast cancer who presents to the ED with complications of tracheostomy. She presented from [place] after here trach tube came out without any manipulation. She was sent to the ED for further observation due to concern for possible respiratory complications. Trach placed 12/2016. Patient was told that the plan was to remove it at some point ~02/2017. In the ED patient was afebrile, HDS and satting well on room air. She denied any respiratory complaints and stated that she was feeling "very good."   Of note, patient had surgery for perforated sigmoid colon in 12/2016 leading to sepsis, encephalopathy and respiratory failure. Developed septic shock post-operatively and was intubated. She failed extubation twice and was finally placed on trach. Her hospital course was complicated by cardiac arrest x2. Work up for cardiac arrest led to findings of PE.   Meds:  Current Meds  Medication Sig  . acetaminophen (TYLENOL) 650 MG suppository Place 1 suppository (650 mg total) rectally every 6 (six) hours as needed for fever.  Marland Kitchen albuterol (PROVENTIL) (2.5 MG/3ML) 0.083% nebulizer solution Take 3 mLs (2.5 mg total) by nebulization 2 (two) times daily.  Marland Kitchen apixaban (ELIQUIS) 5 MG TABS tablet Take 1 tablet (5 mg total) by mouth 2 (two) times daily.  Marland Kitchen guaiFENesin (ROBITUSSIN) 100  MG/5ML SOLN Place 5 mLs (100 mg total) into feeding tube every 8 (eight) hours.  . insulin aspart (NOVOLOG) 100 UNIT/ML injection Inject 2-6 Units into the skin every 4 (four) hours.  . Nutritional Supplements (FEEDING SUPPLEMENT, VITAL AF 1.2 CAL,) LIQD Place 1,000 mLs into feeding tube continuous.  . ondansetron (ZOFRAN) 4 MG/2ML SOLN injection Inject 2 mLs (4 mg total) into the vein every 6 (six) hours as needed for nausea.  . pantoprazole sodium (PROTONIX) 40 mg/20 mL PACK Place 20 mLs (40 mg total) into feeding tube daily.    Allergies: Allergies as of 01/26/2017  . (No Known Allergies)   Past Medical History:  Diagnosis Date  . Abnormality of gait   . Cancer ([place])    breast  . Memory loss   . Multiple sclerosis ([place])   . Neuromuscular disorder ([place])    multiple sclerosis  . Osteoporosis, unspecified 01/06/2014   Past Surgical History:  Procedure Laterality Date  . DIAGNOSTIC LAPAROSCOPY     "swallowed something"  . LAPAROTOMY N/A 12/10/2016   Procedure: EXPLORATORY LAPAROTOMY WITH SIGMOID COLECTOMY ANT HARTMAN'S POUCH;  Surgeon: Erroll Luna, MD;  Location: [facility];  Service: General;  Laterality: N/A;  . MASTECTOMY Right   . MASTECTOMY W/ SENTINEL NODE BIOPSY Right 12/02/2013   Procedure: TOTAL MASTECTOMY WITH SENTINEL LYMPH NODE BIOPSY;  Surgeon: Adin Hector, MD;  Location: [facility];  Service: General;  Laterality: Right;  . TONSILLECTOMY    . TRACHEOSTOMY  12/2016   Hattie Perch 01/26/2017    Family History:  Family History  Problem Relation Age of Onset  . Cancer Mother        breast   Social History:  Social History   Social History  . Marital status: Widowed    Spouse name: N/A  . Number of children: 2  . Years of education: N/A   Social History Main Topics  . Smoking status: Former Smoker    Packs/day: 1.00    Years: 5.00    Types: Cigarettes  . Smokeless tobacco: Never Used     Comment: "quit smoking cigarettes in 1999"  . Alcohol use Yes  . Drug use:  Yes    Types: Cocaine     Comment: 12/29/2014 "quit "2011"  . Sexual activity: No   Other Topics Concern  . None   Social History Narrative   Patient lives at home with Nicola Girt. Patient has 2 children. Patient does not work.     Review of Systems: A complete ROS was negative except as per HPI.   Physical Exam: Blood pressure 106/64, pulse 83, temperature 98.6 F (37 C), temperature source Oral, resp. rate 18, height 6' (1.829 m), weight 106 lb (48.1 kg), SpO2 100 %.  General: pleasant well-developed female, in bed watching TV, in no acute distress  HENT: NCAT, neck supple and FROM, OP clear without exudates or erythema, poor dentition, trach site clean without signs of infection  Eyes: anicteric sclera, PERRL Cardiac: regular rate and rhythm, nl S1/S2, no murmurs, rubs or gallops  Pulm: CTAB, no wheezes or crackles, no increased work of breathing  Abd: soft, NTND, bowel sounds present, healing scar in the center of the abdomen, ostomy bag on LLQ with yellowish output, stoma difficult to visualize due large amount of output, PEG tube noted on LUQ Neuro: A&Ox3, able to move all four extremities, no focal deficits noted  Ext: warm and well perfused, no peripheral edema, 2+ DP pulses bilaterally  Derm: no skin findings  Psych: attentive, appropriate affect, answers questions appropriately   Labs:  CBC WBC 9.1 H/H 8.8/28.5 Plt 385 BMP 133  3.6  96  28  11  0.59  103   EKG: none   CXR: personally reviewed my interpretation is patent airway, L basilar opacity, enlargement of the cardiac silhouette, no consolidations/effusions/edema noted   Assessment & Plan by Problem:  Melissa Oconnell is a 61 y.o. female with history of MS and breast cancer who presents for observation and monitoring of respiratory status after unintentional removal of tracheostomy tube.   # Tracheostomy complication: Patient currently in no respiratory distress and satting well on room air. CXR clear.    - Continuous pulse ox  - Albuterol nebs BID  - PO Robitussin 55mL TID - Care management consult   # Hx of sigmoid colon perforation s/p exploratory laparotomy with sigmoid colectomy and formation of colostomy: PEG tube in place. Ostomy bag with yellowish output.   - WOC consult   # Multiple sclerosis: Patient no longer able to ambulate and with progressive dementia.  - Per neurology note 11/16/2016, patient is not a good candidate for immunomodulation therapy.   # Hx of breast cancer: ER positive, PR and Her2/neu negative, Stage IIA per oncology note 04/05/2016. Patient is no longer on adjuvant antiestrogen therapy given worsening bone density.     IVF: none  Diet: thin  DVT ppx: home Eliquis  Code status: Full code, confirmed on admission   Dispo: Admit patient to Observation with expected length of stay less than 2 midnights.  Signed: Welford Roche, MD  Internal Medicine PGY-1  P [phone] 01/27/2017, 5:02 AM

## 2017-01-27 NOTE — Discharge Summary (Signed)
Name: Melissa Oconnell MRN: [id number] DOB: [birth year] 61 y.o. PCP: Care, [facility]  Date of Admission: 01/26/2017  6:55 PM Date of Discharge: 01/27/2017 Attending Physician: Bartholomew Crews, MD  Discharge Diagnosis:  Principal Problem:   Tracheostomy complication [facility]) Active Problems:   Multiple sclerosis ([place])   Breast cancer of upper-outer quadrant of right female breast [facility])   Discharge Medications: Allergies as of 01/27/2017   No Known Allergies     Medication List    TAKE these medications   albuterol (2.5 MG/3ML) 0.083% nebulizer solution Commonly known as:  PROVENTIL Take 3 mLs (2.5 mg total) by nebulization 2 (two) times daily.   apixaban 5 MG Tabs tablet Commonly known as:  ELIQUIS Take 1 tablet (5 mg total) by mouth 2 (two) times daily.   aspirin 81 MG chewable tablet Chew 81 mg by mouth daily.   famotidine 20 MG tablet Commonly known as:  PEPCID Take 20 mg by mouth 2 (two) times daily.   feeding supplement (PRO-STAT SUGAR FREE 64) Liqd Take 30 mLs by mouth daily.   furosemide 40 MG tablet Commonly known as:  LASIX Take 40 mg by mouth daily.   guaiFENesin 200 MG tablet Take 200 mg by mouth 3 (three) times daily. What changed:  Another medication with the same name was changed. Make sure you understand how and when to take each.   guaiFENesin 100 MG/5ML Soln Commonly known as:  ROBITUSSIN Take 5 mLs (100 mg total) by mouth every 8 (eight) hours. What changed:  how to take this   insulin lispro 100 UNIT/ML injection Commonly known as:  HUMALOG Inject 2-10 Units into the skin 3 (three) times daily before meals. If 151-200=2units 201-250=4units 251-300=6units 301-350=8units 351-400=10units IF >401=call MD   ipratropium-albuterol 0.5-2.5 (3) MG/3ML Soln Commonly known as:  DUONEB Take 3 mLs by nebulization every 6 (six) hours.   multivitamin with minerals Tabs tablet Take 1 tablet by mouth daily.   ondansetron 4 MG/2ML  Soln injection Commonly known as:  ZOFRAN Inject 2 mLs (4 mg total) into the vein every 6 (six) hours as needed for nausea.   saccharomyces boulardii 250 MG capsule Commonly known as:  FLORASTOR Take 250 mg by mouth 2 (two) times daily.   UNABLE TO FIND Take by mouth 2 (two) times daily. Med Name: MedPass   zinc sulfate 220 (50 Zn) MG capsule Take 220 mg by mouth daily.       Disposition and follow-up:   Melissa Oconnell was discharged from [facility] in Good condition.  At the hospital follow up visit please address:  1. Tracheostomy Complication: Please have patient follow up at her trach clinic concerning the tracheostomy site.  2.  Labs / imaging needed at time of follow-up: None  3.  Pending labs/ test needing follow-up: None  Follow-up Appointments:   Please follow up with tracheostomy clinic.  Hospital Course by problem list:  Tracheostomy Complication: Patient was admitted for observation overnight due to unintentional tracheostomy decannulation. Patient was not experiencing pain, difficulty breathing or air leak. She had no issues with speech. The tracheostomy site was clean and dry with no edema or erythema. The plan was to remove the trach in 1 month. As she is functioning well without the trach and there is not visable tract, there did not appear to be an indication to replace trach. She was discharged back to SNF and will follow up with trach clinic.  Discharge Vitals:  BP 104/62 (BP Location: Right Arm)   Pulse 77   Temp 99.7 F (37.6 C) (Oral)   Resp 18   Ht 6' (1.829 m)   Wt 112 lb (50.8 kg)   SpO2 98%   BMI 15.19 kg/m   Pertinent Labs, Studies, and Procedures:   CBC Latest Ref Rng & Units 01/26/2017 01/16/2017 01/12/2017  WBC 4.0 - 10.5 K/uL 9.1 11.5(H) 12.0(H)  Hemoglobin 12.0 - 15.0 g/dL 8.8(L) 9.0(L) 8.7(L)  Hematocrit 36.0 - 46.0 % 28.5(L) 29.4(L) 28.3(L)  Platelets 150 - 400 K/uL 385 458(H) 609(H)   BMP Latest Ref Rng &  Units 01/26/2017 01/16/2017 01/12/2017  Glucose 65 - 99 mg/dL 103(H) 124(H) 105(H)  BUN 6 - 20 mg/dL 11 19 14   Creatinine 0.44 - 1.00 mg/dL 0.59 0.44 0.42(L)  Sodium 135 - 145 mmol/L 133(L) 135 134(L)  Potassium 3.5 - 5.1 mmol/L 3.6 3.9 3.8  Chloride 101 - 111 mmol/L 96(L) 99(L) 98(L)  CO2 22 - 32 mmol/L 28 27 29   Calcium 8.9 - 10.3 mg/dL 9.6 10.1 9.3   Chest X-ray: IMPRESSION: - Significantly decreased left lung base opacity, favoring improvedatelectasis.  - Stable mild cardiomegaly without pulmonary edema.  Discharge Instructions: Discharge Instructions    Call MD for:  difficulty breathing, headache or visual disturbances    Complete by:  As directed    Call MD for:  persistant nausea and vomiting    Complete by:  As directed    Call MD for:  temperature >100.4    Complete by:  As directed    Diet - low sodium heart healthy    Complete by:  As directed    Discharge instructions    Complete by:  As directed    You did not have any complications from the early accidental removal of your tracheostomy tube. The site should heal well, but please follow up with your tracheostomy clinic for further instructions.  Thank you for allowing [place] to care for you.   Increase activity slowly    Complete by:  As directed       Signed: Neva Seat, DO IM PGY-1 Pager: [phone]

## 2017-01-27 NOTE — Social Work (Signed)
Clinical Social Worker facilitated patient discharge including contacting patient family and facility to confirm patient discharge plans.  Clinical information faxed to facility and family agreeable with plan.    CSW arranged ambulance transport via [facility] to [facility].    RN to call [phone] to give report prior to discharge.   Clinical Social Worker will sign off for now as social work intervention is no longer needed. Please consult us again if new need arises.  Ademola Vert, LCSW Clinical Social Worker [phone]    

## 2017-01-27 NOTE — Consult Note (Signed)
[place] Nurse wound consult note Reason for Consult:Midline abdominal surgical incision, healing. Will continue NS moist gauze to this area.  LLQ Colostomy care Wound type:surgical Pressure Injury POA: NA Measurement: Abdominal wound 6 cm x 0.3 cm x 0.3cm Wound IDP:OEUM pink, moist Drainage (amount, consistency, odor) None noted Periwound:intact Dressing procedure/placement/frequency:Cleanse abdominal wound with NS and pat dry.  Apply NS moist gauze to abdominal wound daily.  Cover with ABD pad and tape.   [place] Nurse ostomy consult note Stoma type/location: LLQ colostomy Stomal assessment/size: 1 1/4"  Peristomal assessment: intact Treatment options for stomal/peristomal skin: intact Output soft brown stool Ostomy pouching: 2pc. 2 1/4" pouch Education provided: None at this time.  Enrolled patient in [place] program: No Will not follow at this time.  Please re-consult if needed.  Domenic Moras RN BSN [place] Pager [phone]

## 2017-01-27 NOTE — Discharge Instructions (Signed)
Information on my medicine - ELIQUIS® (apixaban) ° °This medication education was reviewed with me or my healthcare representative as part of my discharge preparation.  The pharmacist that spoke with me during my hospital stay was:  °Raeleen Winstanley Dien, RPH ° °Why was Eliquis® prescribed for you? °Eliquis® was prescribed to treat blood clots that may have been found in the veins of your legs (deep vein thrombosis) or in your lungs (pulmonary embolism) and to reduce the risk of them occurring again. ° °What do You need to know about Eliquis® ? °The dose is ONE 5 mg tablet taken TWICE daily.  Eliquis® may be taken with or without food.  ° °Try to take the dose about the same time in the morning and in the evening. If you have difficulty swallowing the tablet whole please discuss with your pharmacist how to take the medication safely. ° °Take Eliquis® exactly as prescribed and DO NOT stop taking Eliquis® without talking to the doctor who prescribed the medication.  Stopping may increase your risk of developing a new blood clot.  Refill your prescription before you run out. ° °After discharge, you should have regular check-up appointments with your healthcare provider that is prescribing your Eliquis®. °   °What do you do if you miss a dose? °If a dose of ELIQUIS® is not taken at the scheduled time, take it as soon as possible on the same day and twice-daily administration should be resumed. The dose should not be doubled to make up for a missed dose. ° °Important Safety Information °A possible side effect of Eliquis® is bleeding. You should call your healthcare provider right away if you experience any of the following: °? Bleeding from an injury or your nose that does not stop. °? Unusual colored urine (red or dark brown) or unusual colored stools (red or black). °? Unusual bruising for unknown reasons. °? A serious fall or if you hit your head (even if there is no bleeding). ° °Some medicines may interact with Eliquis®  and might increase your risk of bleeding or clotting while on Eliquis®. To help avoid this, consult your healthcare provider or pharmacist prior to using any new prescription or non-prescription medications, including herbals, vitamins, non-steroidal anti-inflammatory drugs (NSAIDs) and supplements. ° °This website has more information on Eliquis® (apixaban): http://www.eliquis.com/eliquis/home ° °

## 2017-02-01 NOTE — Addendum Note (Signed)
Addendum  created 02/01/17 1824 by Annye Asa, MD   Sign clinical note

## 2017-03-20 ENCOUNTER — Ambulatory Visit (INDEPENDENT_AMBULATORY_CARE_PROVIDER_SITE_OTHER): Payer: Medicare Other | Admitting: Neurology

## 2017-03-20 ENCOUNTER — Encounter: Payer: Self-pay | Admitting: Neurology

## 2017-03-20 VITALS — BP 91/63 | HR 66 | Ht 72.0 in | Wt 114.5 lb

## 2017-03-20 DIAGNOSIS — R269 Unspecified abnormalities of gait and mobility: Secondary | ICD-10-CM

## 2017-03-20 DIAGNOSIS — G35 Multiple sclerosis: Secondary | ICD-10-CM | POA: Diagnosis not present

## 2017-03-20 DIAGNOSIS — I639 Cerebral infarction, unspecified: Secondary | ICD-10-CM

## 2017-03-20 NOTE — Progress Notes (Addendum)
[facility]  PATIENT: Melissa Oconnell DOB: April 22, 1956   REASON FOR VISIT: follow-up for multiple sclerosis, abnormality of gait and dementia HISTORY FROM:caregiver [name] and patient  HISTORY OF PRESENT ILLNESS :HISTORY:She has a history of gait disorder which has been going on for several years, she lives with her [place], initial evaluation was in May 2016.  MRI of the brain in 2013 shows multiple brainstem, cerebellar, subcortical and corpus callosum white matter hyperintensities suspicious for demyelinating disease. Presence of T1 black holes and atrophy indicate chronic disease. Patient has never been on any immune modulating therapy. Her mom and her sister both died of MS at early ages. She has received some physical therapy since last seen which was beneficial.  She has had an abnormal visual evoked response. She had greater than 5 oligoclonal bands in her CSF with LP done 06/11/2012.  She denies any falls since last seen. She is not using an assistive device. Her memory is stable  UPDATE Dec 07 2014: She ambulate with a walker, slow worsening gait difficulty, difficult to bend her right leg, she sleeps well, has good appetite, worsening memory trouble UPDATE January 25 2015:She did not have her MRI of brain with without contrast as scheduled, walk with a walker, baclofen 10 mg 3 times a day has somewhat helpful, She also has significant memory trouble rely on her her caregiver UPDATE February 25 2015: MRI brain in August 2016 w/wo: many T2/FLAIR hyperintense foci in the cerebellum, brainstem, left thalamus and both hemispheres in a pattern and configuration consistent with the diagnosis of multiple sclerosis. None of the foci appears to be acute. There are no enhancing lesions. When compared to an MRI dated 05/20/2012, there is no definite interim change.Reviewed laboratory evaluation, normal negative CMP, CBC, TSH, RPR, B12 She has more trouble walking  and become more confused, fell few times, difficulty to handle daily activities, she can feed herself, sleeps well, she can dress herself, but need help sometimes,   Update Nov 16 2016 Patient is here to follow-up secondary progressive multiple sclerosis, she has never been treated with any term immunomodulation therapy, she has significant memory loss, is taking Aricept 10 mg daily, previously tried baclofen, cause significant side effect, in no improvement in her gait abnormality.  Last clinical visit was with [name] in August 2017, she still lives with her caregiver [name], she continues to worsening gait abnormality, cannot longer ambulate with walker, rely on her wheelchair, need assistant transfer, difficulty moving her right leg, she eats well, she can feed herself, she has bowel and bladder incontinence, wear depends, she is relaxed, but more forgetful, confused.  We have personally reviewed MRI of the brain without contrast in August 2016, extensive atrophy, supratentorium T2/FLAIR hyperintensity lesion, no significant change compared to 2013, no contrast enhancement.  UPDATE Sept 4 2018: I reviewed and summarized discharge summary on December 26 2016, she developed perforated sigmoid colon leading to sepsis, encephalopathy, respiratory failure in May 2018, aspiration event with cardiac arrest on May 31, require reintubation, then bradycardia/PA arrest on December 16 2016, possible related to pneumonia, respiration failure with hypoxemia, require tracheostomy  Echocardiogram showed ejection fraction 50-55%, CT chest showed small sub-segment PE, multilobular pneumonia, Ultrasound of left upper extremity showed multi segment superficial vein thrombosis of the left upper extremity,  She was treated with prolonged course of antibiotics, also had colostomy on Dec 10 2016, left superficial thrombosis of left cephalic vein,  REVIEW OF SYSTEMS: Full 14 system review  of systems performed and notable only  for those listed, all others are neg:     ALLERGIES: No Known Allergies  HOME MEDICATIONS: Outpatient Medications Prior to Visit  Medication Sig Dispense Refill  . apixaban (ELIQUIS) 5 MG TABS tablet Take 1 tablet (5 mg total) by mouth 2 (two) times daily. 60 tablet   . aspirin 81 MG chewable tablet Chew 81 mg by mouth daily.    . famotidine (PEPCID) 20 MG tablet Take 20 mg by mouth 2 (two) times daily.    . furosemide (LASIX) 40 MG tablet Take 40 mg by mouth daily.    Marland Kitchen guaiFENesin 200 MG tablet Take 200 mg by mouth 3 (three) times daily.    . Multiple Vitamin (MULTIVITAMIN WITH MINERALS) TABS tablet Take 1 tablet by mouth daily.    Marland Kitchen saccharomyces boulardii (FLORASTOR) 250 MG capsule Take 250 mg by mouth 2 (two) times daily.    Marland Kitchen UNABLE TO FIND Take by mouth 2 (two) times daily. Med Name: MedPass    . zinc sulfate 220 (50 Zn) MG capsule Take 220 mg by mouth daily.    Marland Kitchen albuterol (PROVENTIL) (2.5 MG/3ML) 0.083% nebulizer solution Take 3 mLs (2.5 mg total) by nebulization 2 (two) times daily. 75 mL 12  . Amino Acids-Protein Hydrolys (FEEDING SUPPLEMENT, PRO-STAT SUGAR FREE 64,) LIQD Take 30 mLs by mouth daily.    Marland Kitchen guaiFENesin (ROBITUSSIN) 100 MG/5ML SOLN Take 5 mLs (100 mg total) by mouth every 8 (eight) hours. 1200 mL 0  . insulin lispro (HUMALOG) 100 UNIT/ML injection Inject 2-10 Units into the skin 3 (three) times daily before meals. If 151-200=2units 201-250=4units 251-300=6units 301-350=8units 351-400=10units IF >401=call MD    . ipratropium-albuterol (DUONEB) 0.5-2.5 (3) MG/3ML SOLN Take 3 mLs by nebulization every 6 (six) hours.    . ondansetron (ZOFRAN) 4 MG/2ML SOLN injection Inject 2 mLs (4 mg total) into the vein every 6 (six) hours as needed for nausea. 2 mL 0   Facility-Administered Medications Prior to Visit  Medication Dose Route Frequency Provider Last Rate Last Dose  . 0.9 %  sodium chloride infusion   Intravenous Once Heath Lark, MD        PAST MEDICAL  HISTORY: Past Medical History:  Diagnosis Date  . Abnormality of gait   . Cancer ([place])    breast  . Memory loss   . Multiple sclerosis ([place])   . Neuromuscular disorder ([place])    multiple sclerosis  . Osteoporosis, unspecified 01/06/2014    PAST SURGICAL HISTORY: Past Surgical History:  Procedure Laterality Date  . DIAGNOSTIC LAPAROSCOPY     "swallowed something"  . LAPAROTOMY N/A 12/10/2016   Procedure: EXPLORATORY LAPAROTOMY WITH SIGMOID COLECTOMY ANT HARTMAN'S POUCH;  Surgeon: Erroll Luna, MD;  Location: [facility];  Service: General;  Laterality: N/A;  . MASTECTOMY Right   . MASTECTOMY W/ SENTINEL NODE BIOPSY Right 12/02/2013   Procedure: TOTAL MASTECTOMY WITH SENTINEL LYMPH NODE BIOPSY;  Surgeon: Adin Hector, MD;  Location: [facility];  Service: General;  Laterality: Right;  . TONSILLECTOMY    . TRACHEOSTOMY  12/2016   Archie Endo 01/26/2017    FAMILY HISTORY: Family History  Problem Relation Age of Onset  . Cancer Mother        breast    SOCIAL HISTORY: Social History   Social History  . Marital status: Widowed    Spouse name: N/A  . Number of children: 2  . Years of education: N/A   Occupational History  . Not  on file.   Social History Main Topics  . Smoking status: Former Smoker    Packs/day: 1.00    Years: 5.00    Types: Cigarettes  . Smokeless tobacco: Never Used     Comment: "quit smoking cigarettes in 1999"  . Alcohol use Yes  . Drug use: Yes    Types: Cocaine     Comment: 12/29/2014 "quit "2011"  . Sexual activity: No   Other Topics Concern  . Not on file   Social History Narrative   Patient lives at home with [name]. Patient has 2 children. Patient does not work.      PHYSICAL EXAM  Vitals:   03/20/17 1104  BP: 91/63  Pulse: 66  Weight: 114 lb 8 oz (51.9 kg)  Height: 6' (1.829 m)   Body mass index is 15.53 kg/m. Gen: NAD, conversant, well nourised,  well groomed  Cardiovascular: Regular rate rhythm,   Neck: Supple, no carotid bruit.  NEUROLOGICAL EXAM: animal naming 5 MMSE - Mini Mental State Exam 03/20/2017 11/16/2016 03/02/2016  Orientation to time 0 1 1  Orientation to Place 2 3 3   Registration 3 3 3   Attention/ Calculation 3 3 2   Recall 0 0 1  Language- name 2 objects 2 2 2   Language- repeat 1 1 1   Language- follow 3 step command 3 3 3   Language- read & follow direction 1 1 1   Write a sentence 0 1 0  Copy design 0 0 0  Total score 15 18 17    Animal naming 3  CRANIAL NERVES: CN II: Visual fields are full to confrontation. Pupil equal round reactive to light CN III, IV, VI: extraocular movement are normal. No ptosis. CN V: Facial sensation is intact to pinprick in all 3 divisions bilaterally.   CN VII: Face is symmetric with normal eye closure and smile. CN VIII: Hearing is normal to rubbing fingers CN IX, X: Palate elevates symmetrically. Phonation is normal. CN XI: Head turning and shoulder shrug are intact CN XII: Tongue is midline with normal movements and no atrophy.  MOTOR:She has mild bilateral lower extremity spasticity, right arm fixation on rapid rotating movement, moderate bilateral lower extremity spasticity, mild  bilateral hip flexion, weakness, right worse than left REFLEXES: Reflexes are hyperactive and symmetric, Plantar responses are flexor. SENSORY:Withdraws to pain COORDINATION:  Rapid alternating movements and fine finger movements are intact. There is no dysmetria on finger-to-nose and heel-knee-shin.  GAIT/STANCE: She can get up by pushing on chair arm, cautious, mildly unsteady  DIAGNOSTIC DATA (LABS, IMAGING, TESTING) - I reviewed patient records, labs, notes, testing and imaging myself where available.  Lab Results  Component Value Date   WBC 9.1 01/26/2017   HGB 8.8 (L) 01/26/2017   HCT 28.5 (L) 01/26/2017   MCV 90.2 01/26/2017   PLT 385 01/26/2017      Component Value Date/Time   NA 133 (L) 01/26/2017 1923   NA 138 04/05/2016 1056   K  3.6 01/26/2017 1923   K 3.7 04/05/2016 1056   CL 96 (L) 01/26/2017 1923   CO2 28 01/26/2017 1923   CO2 26 04/05/2016 1056   GLUCOSE 103 (H) 01/26/2017 1923   GLUCOSE 85 04/05/2016 1056   BUN 11 01/26/2017 1923   BUN 14.9 04/05/2016 1056   CREATININE 0.59 01/26/2017 1923   CREATININE 1.0 04/05/2016 1056   CALCIUM 9.6 01/26/2017 1923   CALCIUM 9.5 04/05/2016 1056   PROT 4.9 (L) 12/27/2016 0610   PROT 7.8 04/05/2016 1056  ALBUMIN 1.3 (L) 12/27/2016 0610   ALBUMIN 3.6 04/05/2016 1056   AST 30 12/27/2016 0610   AST 13 04/05/2016 1056   ALT 37 12/27/2016 0610   ALT 11 04/05/2016 1056   ALKPHOS 96 12/27/2016 0610   ALKPHOS 67 04/05/2016 1056   BILITOT 0.5 12/27/2016 0610   BILITOT 0.60 04/05/2016 1056   GFRNONAA >60 01/26/2017 1923   GFRAA >60 01/26/2017 1923   ASSESSMENT AND PLAN  61 y.o. year old female   Secondary progressive multiple sclerosis sclerosis   MRI of the brain in 2016, extensive atrophy, supratentorium lesions consistent with multiple sclerosis, no enhancing lesion  I do not think she is a good candidate for long term immunomodulation therapy Worsening gait abnormality  Increased right leg difficulty, I have prescribed right ankle brace,  Continue home physical therapy.  Return to clinic in 6 months    Marcial Pacas, M.D. Ph.D.  [facility] [place], [place] [address] Phone: [phone] Fax:      [phone]  CC: [facility]

## 2017-04-04 ENCOUNTER — Encounter: Payer: Self-pay | Admitting: Hematology and Oncology

## 2017-04-04 ENCOUNTER — Ambulatory Visit (HOSPITAL_BASED_OUTPATIENT_CLINIC_OR_DEPARTMENT_OTHER): Payer: Medicare Other | Admitting: Hematology and Oncology

## 2017-04-04 DIAGNOSIS — Z9181 History of falling: Secondary | ICD-10-CM

## 2017-04-04 DIAGNOSIS — G35 Multiple sclerosis: Secondary | ICD-10-CM

## 2017-04-04 DIAGNOSIS — F0390 Unspecified dementia without behavioral disturbance: Secondary | ICD-10-CM | POA: Diagnosis not present

## 2017-04-04 DIAGNOSIS — Z853 Personal history of malignant neoplasm of breast: Secondary | ICD-10-CM

## 2017-04-04 NOTE — Assessment & Plan Note (Signed)
The patient has very debilitating comorbidities with untreated multiple sclerosis and progressive dementia. She recently had multiorgan failure She had worsening bone density while on arimidex and had recurrent falls. I discussed with [name], her dedicated MPOA. Last year, we made informed decision to discontinue antiestrogen therapy With recent significant debility, I recommend no further follow-up from the cancer center and I do not recommend screening mammogram for contralateral breast She agree with the plan of care

## 2017-04-04 NOTE — Progress Notes (Signed)
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      11/08/2013 Imaging    Due to dense breasts, MRI was ordered and confirmed abnormalities.      12/02/2013 Surgery    She had right mastectomy and sentinel lymph node biopsy with negative margin.      12/02/2013 Pathology Results    Accession: [id number], showed invasive ductal carcinoma grade 2, 2.5 cm in size.      01/13/2014 Imaging    Bone density scan show severe osteoporosis with T. -2.5      02/03/2014 -  Chemotherapy    The patient was started on Arimidex.      09/30/2014 -  Chemotherapy    She is given Prolia for osteoporosis, q 6 months      01/31/2016 Imaging    Repeat bone density showed worsening bone density      04/05/2016 Miscellaneous    Decision is made to discontinue Arimidex by the end of September 2017       INTERVAL HISTORY: Please see below for problem oriented charting. She returns with caregiver She had recent pneumonia and multiorgan failure The patient is very debilitated and has progressive dementia There is no reported palpable breast mass  REVIEW OF SYSTEMS:   Unable to obtain I have reviewed the past medical history, past surgical history, social history and family history with the patient's caregiver and they are unchanged from previous note.  ALLERGIES:  has No Known Allergies.  MEDICATIONS:  Current Outpatient Prescriptions  Medication Sig Dispense Refill  . apixaban (ELIQUIS) 5 MG TABS tablet Take 1 tablet (5 mg total) by mouth 2 (two) times daily. 60 tablet   . aspirin 81 MG chewable tablet Chew 81 mg by mouth daily.    . famotidine (PEPCID) 20 MG tablet Take 20 mg by mouth 2 (two) times daily.    . furosemide (LASIX) 40 MG tablet Take 40 mg by mouth daily.    Marland Kitchen guaiFENesin 200 MG tablet Take 200 mg by mouth 3 (three) times daily.    . Multiple Vitamin (MULTIVITAMIN WITH MINERALS) TABS tablet Take 1 tablet by mouth daily.    . Probiotic Product (PROBIOTIC PO) Take by mouth daily.    Marland Kitchen saccharomyces boulardii (FLORASTOR) 250 MG capsule Take 250 mg by mouth 2 (two) times daily.    Marland Kitchen UNABLE TO FIND Take by mouth 2 (two) times daily. Med Name: MedPass    . zinc sulfate 220 (50 Zn) MG capsule Take 220 mg by mouth daily.     No current facility-administered medications for this visit.  Facility-Administered Medications Ordered in Other Visits  Medication Dose Route Frequency Provider Last Rate Last Dose  . 0.9 %  sodium chloride infusion   Intravenous Once Alvy Bimler, Britain Anagnos, MD        PHYSICAL EXAMINATION: ECOG PERFORMANCE STATUS: 3 - Symptomatic, >50% confined to bed  Vitals:   04/04/17 1054  BP: 108/65  Pulse: 65  Resp: 17  Temp: 98.1 F (36.7 C)  SpO2: 100%   Filed Weights   04/04/17 1054  Weight: 113 lb 12.8 oz (51.6 kg)    GENERAL:alert, no distress and comfortable.  She looks thin and debilitated SKIN: skin color, texture, turgor are normal, no rashes or significant lesions EYES: normal, Conjunctiva are pink and non-injected, sclera clear OROPHARYNX:no exudate, no erythema and lips, buccal mucosa, and tongue normal  NECK:  Tracheostomy scar in situ LYMPH:  no palpable lymphadenopathy in the cervical, axillary or inguinal LUNGS: clear to auscultation and percussion with normal breathing effort HEART: regular rate & rhythm and no murmurs and no lower extremity edema ABDOMEN:abdomen soft, non-tender and normal bowel sounds Musculoskeletal:no cyanosis of digits and no clubbing  Right breast absent on exam.  Well-healed mastectomy scar.  Normal breast exam on the left LABORATORY DATA:  I have reviewed the data as listed    Component Value Date/Time   NA 133 (L) 01/26/2017 1923   NA 138 04/05/2016 1056   K 3.6 01/26/2017 1923   K 3.7 04/05/2016 1056   CL 96 (L) 01/26/2017 1923   CO2 28 01/26/2017 1923   CO2 26 04/05/2016 1056   GLUCOSE 103 (H) 01/26/2017 1923   GLUCOSE 85 04/05/2016 1056   BUN 11 01/26/2017 1923   BUN 14.9 04/05/2016 1056   CREATININE 0.59 01/26/2017 1923   CREATININE 1.0 04/05/2016 1056   CALCIUM 9.6 01/26/2017 1923   CALCIUM 9.5 04/05/2016 1056   PROT 4.9 (L) 12/27/2016 0610   PROT 7.8 04/05/2016 1056   ALBUMIN 1.3 (L) 12/27/2016 0610   ALBUMIN 3.6 04/05/2016 1056   AST 30 12/27/2016 0610   AST 13 04/05/2016 1056   ALT 37 12/27/2016 0610   ALT 11 04/05/2016 1056   ALKPHOS 96 12/27/2016 0610   ALKPHOS 67 04/05/2016 1056   BILITOT 0.5 12/27/2016 0610   BILITOT 0.60 04/05/2016 1056   GFRNONAA >60 01/26/2017 1923   GFRAA >60 01/26/2017 1923    No results found for: SPEP, UPEP  Lab Results  Component Value Date   WBC 9.1 01/26/2017   NEUTROABS 4.3 01/26/2017   HGB 8.8 (L) 01/26/2017   HCT 28.5 (L) 01/26/2017   MCV 90.2 01/26/2017   PLT 385 01/26/2017      Chemistry      Component Value Date/Time   NA 133 (L) 01/26/2017 1923   NA 138 04/05/2016 1056   K 3.6 01/26/2017 1923   K 3.7 04/05/2016 1056   CL 96 (L) 01/26/2017 1923   CO2 28 01/26/2017 1923   CO2 26 04/05/2016 1056   BUN 11 01/26/2017 1923   BUN 14.9 04/05/2016 1056   CREATININE 0.59 01/26/2017 1923    CREATININE 1.0 04/05/2016 1056      Component Value Date/Time   CALCIUM 9.6 01/26/2017 1923   CALCIUM 9.5 04/05/2016 1056   ALKPHOS 96 12/27/2016 0610   ALKPHOS 67 04/05/2016 1056   AST 30 12/27/2016 0610   AST 13 04/05/2016 1056   ALT 37 12/27/2016 0610   ALT 11 04/05/2016 1056   BILITOT 0.5 12/27/2016 0610   BILITOT 0.60  04/05/2016 1056      ASSESSMENT & PLAN:  History of cancer of right breast The patient has very debilitating comorbidities with untreated multiple sclerosis and progressive dementia. She recently had multiorgan failure She had worsening bone density while on arimidex and had recurrent falls. I discussed with [name], her dedicated MPOA. Last year, we made informed decision to discontinue antiestrogen therapy With recent significant debility, I recommend no further follow-up from the cancer center and I do not recommend screening mammogram for contralateral breast She agree with the plan of care   No orders of the defined types were placed in this encounter.  All questions were answered. The patient knows to call the clinic with any problems, questions or concerns. No barriers to learning was detected. I spent 10 minutes counseling the patient face to face. The total time spent in the appointment was 15 minutes and more than 50% was on counseling and review of test results     Heath Lark, MD 04/04/2017 12:04 PM

## 2017-05-21 ENCOUNTER — Ambulatory Visit: Payer: Medicare Other | Admitting: Nurse Practitioner

## 2017-05-24 ENCOUNTER — Other Ambulatory Visit: Payer: Self-pay | Admitting: Nurse Practitioner

## 2017-05-26 ENCOUNTER — Other Ambulatory Visit: Payer: Self-pay | Admitting: Nurse Practitioner

## 2017-06-04 ENCOUNTER — Telehealth: Payer: Self-pay | Admitting: *Deleted

## 2017-06-04 NOTE — Progress Notes (Deleted)
[facility]  PATIENT: Melissa Oconnell DOB: 1955-12-16   REASON FOR VISIT: *** HISTORY FROM:    HISTORY OF PRESENT ILLNESS:   REVIEW OF SYSTEMS: Full 14 system review of systems performed and notable only for those listed, all others are neg:  Constitutional: neg  Cardiovascular: neg Ear/Nose/Throat: neg  Skin: neg Eyes: neg Respiratory: neg Gastroitestinal: neg  Hematology/Lymphatic: neg  Endocrine: neg Musculoskeletal:neg Allergy/Immunology: neg Neurological: neg Psychiatric: neg Sleep : neg   ALLERGIES: No Known Allergies  HOME MEDICATIONS: Outpatient Medications Prior to Visit  Medication Sig Dispense Refill  . apixaban (ELIQUIS) 5 MG TABS tablet Take 1 tablet (5 mg total) by mouth 2 (two) times daily. 60 tablet   . aspirin 81 MG chewable tablet Chew 81 mg by mouth daily.    . famotidine (PEPCID) 20 MG tablet Take 20 mg by mouth 2 (two) times daily.    . furosemide (LASIX) 40 MG tablet Take 40 mg by mouth daily.    Marland Kitchen guaiFENesin 200 MG tablet Take 200 mg by mouth 3 (three) times daily.    . Multiple Vitamin (MULTIVITAMIN WITH MINERALS) TABS tablet Take 1 tablet by mouth daily.    . Probiotic Product (PROBIOTIC PO) Take by mouth daily.    Marland Kitchen saccharomyces boulardii (FLORASTOR) 250 MG capsule Take 250 mg by mouth 2 (two) times daily.    Marland Kitchen UNABLE TO FIND Take by mouth 2 (two) times daily. Med Name: MedPass    . zinc sulfate 220 (50 Zn) MG capsule Take 220 mg by mouth daily.     Facility-Administered Medications Prior to Visit  Medication Dose Route Frequency Provider Last Rate Last Dose  . 0.9 %  sodium chloride infusion   Intravenous Once Heath Lark, MD        PAST MEDICAL HISTORY: Past Medical History:  Diagnosis Date  . Abnormality of gait   . Cancer ([place])    breast  . Memory loss   . Multiple sclerosis ([place])   . Neuromuscular disorder ([place])    multiple sclerosis  . Osteoporosis, unspecified 01/06/2014    PAST SURGICAL  HISTORY: Past Surgical History:  Procedure Laterality Date  . DIAGNOSTIC LAPAROSCOPY     "swallowed something"  . EXPLORATORY LAPAROTOMY WITH SIGMOID COLECTOMY ANT HARTMAN'S POUCH N/A 12/10/2016   Performed by Erroll Luna, MD at [facility] OR  . MASTECTOMY Right   . TONSILLECTOMY    . TOTAL MASTECTOMY WITH SENTINEL LYMPH NODE BIOPSY Right 12/02/2013   Performed by Fanny Skates, MD at [place]  12/2016   Archie Endo 01/26/2017    FAMILY HISTORY: Family History  Problem Relation Age of Onset  . Cancer Mother        breast    SOCIAL HISTORY: Social History   Socioeconomic History  . Marital status: Widowed    Spouse name: Not on file  . Number of children: 2  . Years of education: Not on file  . Highest education level: Not on file  Social Needs  . Financial resource strain: Not on file  . Food insecurity - worry: Not on file  . Food insecurity - inability: Not on file  . Transportation needs - medical: Not on file  . Transportation needs - non-medical: Not on file  Occupational History  . Not on file  Tobacco Use  . Smoking status: Former Smoker    Packs/day: 1.00    Years: 5.00    Pack years: 5.00    Types: Cigarettes  .  Smokeless tobacco: Never Used  . Tobacco comment: "quit smoking cigarettes in 1999"  Substance and Sexual Activity  . Alcohol use: Yes  . Drug use: Yes    Types: Cocaine    Comment: 12/29/2014 "quit "2011"  . Sexual activity: No  Other Topics Concern  . Not on file  Social History Narrative   Patient lives at home with [name]. Patient has 2 children. Patient does not work.      PHYSICAL EXAM  There were no vitals filed for this visit. There is no height or weight on file to calculate BMI.  Generalized: Well developed, in no acute distress  Head: normocephalic and atraumatic,. Oropharynx benign  Neck: Supple, no carotid bruits  Cardiac: Regular rate rhythm, no murmur  Musculoskeletal: No deformity   Neurological  examination   Mentation: Alert oriented to time, place, history taking. Attention span and concentration appropriate. Recent and remote memory intact.  Follows all commands speech and language fluent.   Cranial nerve II-XII: Fundoscopic exam reveals sharp disc margins.Pupils were equal round reactive to light extraocular movements were full, visual field were full on confrontational test. Facial sensation and strength were normal. hearing was intact to finger rubbing bilaterally. Uvula tongue midline. head turning and shoulder shrug were normal and symmetric.Tongue protrusion into cheek strength was normal. Motor: normal bulk and tone, full strength in the BUE, BLE, fine finger movements normal, no pronator drift. No focal weakness Sensory: normal and symmetric to light touch, pinprick, and  Vibration, proprioception  Coordination: finger-nose-finger, heel-to-shin bilaterally, no dysmetria Reflexes: Brachioradialis 2/2, biceps 2/2, triceps 2/2, patellar 2/2, Achilles 2/2, plantar responses were flexor bilaterally. Gait and Station: Rising up from seated position without assistance, normal stance,  moderate stride, good arm swing, smooth turning, able to perform tiptoe, and heel walking without difficulty. Tandem gait is steady  DIAGNOSTIC DATA (LABS, IMAGING, TESTING) - I reviewed patient records, labs, notes, testing and imaging myself where available.  Lab Results  Component Value Date   WBC 9.1 01/26/2017   HGB 8.8 (L) 01/26/2017   HCT 28.5 (L) 01/26/2017   MCV 90.2 01/26/2017   PLT 385 01/26/2017      Component Value Date/Time   NA 133 (L) 01/26/2017 1923   NA 138 04/05/2016 1056   K 3.6 01/26/2017 1923   K 3.7 04/05/2016 1056   CL 96 (L) 01/26/2017 1923   CO2 28 01/26/2017 1923   CO2 26 04/05/2016 1056   GLUCOSE 103 (H) 01/26/2017 1923   GLUCOSE 85 04/05/2016 1056   BUN 11 01/26/2017 1923   BUN 14.9 04/05/2016 1056   CREATININE 0.59 01/26/2017 1923   CREATININE 1.0 04/05/2016  1056   CALCIUM 9.6 01/26/2017 1923   CALCIUM 9.5 04/05/2016 1056   PROT 4.9 (L) 12/27/2016 0610   PROT 7.8 04/05/2016 1056   ALBUMIN 1.3 (L) 12/27/2016 0610   ALBUMIN 3.6 04/05/2016 1056   AST 30 12/27/2016 0610   AST 13 04/05/2016 1056   ALT 37 12/27/2016 0610   ALT 11 04/05/2016 1056   ALKPHOS 96 12/27/2016 0610   ALKPHOS 67 04/05/2016 1056   BILITOT 0.5 12/27/2016 0610   BILITOT 0.60 04/05/2016 1056   GFRNONAA >60 01/26/2017 1923   GFRAA >60 01/26/2017 1923   Lab Results  Component Value Date   TRIG 106 12/12/2016   Lab Results  Component Value Date   HGBA1C 5.5 12/27/2016   Lab Results  Component Value Date   VITAMINB12 740 12/07/2014   Lab Results  Component Value Date   TSH 1.011 12/27/2016    ***  ASSESSMENT AND PLAN  61 y.o. year old female  has a past medical history of Abnormality of gait, Cancer ([place]), Memory loss, Multiple sclerosis ([place]), Neuromuscular disorder ([place]), and Osteoporosis, unspecified (01/06/2014). here with ***    Rayburn Ma, [facility], APRN  [facility] [address], [place], [place] [address] [phone]

## 2017-06-05 ENCOUNTER — Ambulatory Visit: Payer: Medicare Other | Admitting: Nurse Practitioner

## 2017-06-05 NOTE — Telephone Encounter (Signed)
error 

## 2017-06-22 ENCOUNTER — Encounter (HOSPITAL_BASED_OUTPATIENT_CLINIC_OR_DEPARTMENT_OTHER): Payer: Medicare Other | Attending: Internal Medicine

## 2017-06-22 DIAGNOSIS — T8131XA Disruption of external operation (surgical) wound, not elsewhere classified, initial encounter: Secondary | ICD-10-CM | POA: Diagnosis not present

## 2017-06-22 DIAGNOSIS — Y833 Surgical operation with formation of external stoma as the cause of abnormal reaction of the patient, or of later complication, without mention of misadventure at the time of the procedure: Secondary | ICD-10-CM | POA: Insufficient documentation

## 2017-06-22 DIAGNOSIS — G35 Multiple sclerosis: Secondary | ICD-10-CM | POA: Insufficient documentation

## 2017-06-22 DIAGNOSIS — L89313 Pressure ulcer of right buttock, stage 3: Secondary | ICD-10-CM | POA: Insufficient documentation

## 2018-05-27 ENCOUNTER — Other Ambulatory Visit: Payer: Self-pay | Admitting: Internal Medicine

## 2018-05-27 DIAGNOSIS — Z1231 Encounter for screening mammogram for malignant neoplasm of breast: Secondary | ICD-10-CM

## 2018-07-12 ENCOUNTER — Inpatient Hospital Stay: Admission: RE | Admit: 2018-07-12 | Payer: Medicare Other | Source: Ambulatory Visit

## 2018-07-22 ENCOUNTER — Ambulatory Visit
Admission: RE | Admit: 2018-07-22 | Discharge: 2018-07-22 | Disposition: A | Discharge status: Home / Self Care | Payer: Medicare Other | Source: Ambulatory Visit | Attending: Internal Medicine | Admitting: Internal Medicine

## 2018-07-22 DIAGNOSIS — Z1231 Encounter for screening mammogram for malignant neoplasm of breast: Secondary | ICD-10-CM

## 2018-07-25 ENCOUNTER — Encounter: Payer: Self-pay | Admitting: Registered"

## 2018-07-25 ENCOUNTER — Encounter: Payer: Medicare Other | Attending: Family | Admitting: Registered"

## 2018-07-25 DIAGNOSIS — R634 Abnormal weight loss: Secondary | ICD-10-CM | POA: Insufficient documentation

## 2018-07-25 NOTE — Patient Instructions (Addendum)
Avoid drinking water with meals. Use the tip sheet for more ideas When eating yogurt consider using whole milk variety Ways to increase calories: Use gravies, creams, heavy creams, butter Try lactose free milk and get the whole milk, use to make the oatmeal Use whole fat yogurt, can include as part of your smoothie Use Ensure, Boost or store brand high calorie shakes (about 350 calories) Consider using Coconut milk for soups

## 2018-07-25 NOTE — Progress Notes (Signed)
Medical Nutrition Therapy:  Appt start time: 1110 end time:  1200.  Assessment:  Primary concerns today: This patient is accompanied in the office by her caregiver..  Wt Readings from Last 3 Encounters:  MD visit this week per caregiver 88 lbs  04/04/17 113 lb 12.8 oz (51.6 kg)  03/20/17 114 lb 8 oz (51.9 kg)  01/27/17 112 lb (50.8 kg)   Patient did not speak for herself, but smiled and nodded at times in response to the conversation. Caregiver states that she is an in-home caregiver and provided history and present routines. Caregiver states that ~1 year ago patient had a bowel obstruction and has a colostomy bag now. Caregiver states that before patient went into the hospital she had benefits through social services and received boxes of Ensure supplements but now does not qualify for that.  Caregiver states patient has a large appetite, and eats everything that is given to her. Caregiver states the only restriction she has done is milk due to cramping. Caregiver states she does not have access to Internet to access online resources.  Caregiver states [place] a [facility] [place] and will look into the possibility of patient attending the program.   Ensure Jeanne Ivan [id number]  Preferred Learning Style:   No preference indicated   Learning Readiness:   Ready  MEDICATIONS: reviewed   DIETARY INTAKE:  Usual eating pattern includes 4 meals and 1 snacks per day.  Everyday foods include oatmeal.  Avoided foods include lactose.    24-hr recall:  B ( AM): oatmeal, applesauce, banana  Snk ( AM): potato salad, cabbage, chicken  L ( PM): cream potatoes, gravy green beans, peaches Snk ( PM):  D ( PM): creamed potatoes, carrots, stewed apples Snk ( PM): ice cream Beverages: water, apple juice  Usual physical activity: none (MS and wheelchair bound)  Estimated energy needs: RD anticipates energy needs higher than typical calculation.  Progress Towards Goal(s):  New  goal.   Nutritional Diagnosis:  NI-5.7.1 Inadequate protein intake As related to intake not enough to maintain energy needs.  As evidenced by continued weight loss and BMI of 12.(per referral)    Intervention:  Nutrition Education. Discussed ways to increase caloric intake. Discussed limiting plain water. Discussed options for nutritional supplement drinks based on financial concern.  Plan: Avoid drinking water with meals. Use the tip sheet for more ideas When eating yogurt consider using whole milk variety Ways to increase calories: Use gravies, creams, heavy creams, butter Try lactose free milk and get the whole milk, use to make the oatmeal Use whole fat yogurt, can include as part of your smoothie Use Ensure, Boost or store brand high calorie shakes (about 350 calories) Consider using Coconut milk for soups  Teaching Method Utilized:  Visual Auditory Hands on  Handouts given during visit include:  Boost coupon  High Calorie eating tips  Barriers to learning/adherence to lifestyle change: none  Demonstrated degree of understanding via:  Teach Back   Monitoring/Evaluation:  Dietary intake, exercise, and body weight in 4 week(s).

## 2018-08-22 ENCOUNTER — Encounter: Payer: Medicare Other | Attending: Family | Admitting: Registered"

## 2018-08-22 VITALS — Ht 72.0 in | Wt 89.9 lb

## 2018-08-22 DIAGNOSIS — R634 Abnormal weight loss: Secondary | ICD-10-CM | POA: Insufficient documentation

## 2018-08-22 NOTE — Progress Notes (Signed)
Medical Nutrition Therapy:  Appt start time: 9562 end time:  1125.  Follow-up Assessment:  Primary concerns today: Low BMI (12.19)  This patient is accompanied in the office by her caregiver.Annell Greening Readings:  08/22/2018 (NDES) 89.9 lbs  MD visit Jan 2020 88 lbs  04/04/17 113 lb 12.8 oz (51.6 kg)  03/20/17 114 lb 8 oz (51.9 kg)  01/27/17 112 lb (50.8 kg)   Patient provide some verbal responses such as yes, no and thank you, but RD mostly provided guidance to caregiver.  Caregiver states she has implemented reducing foods that were low in calories and including more energy dense options suggested during last visit. Caregiver would like to see her client gain more weight and strength. Caregiver states she is still pursuing potential benefits through social services to receive Ensure delivered to patient's home.  Caregiver states patient continues to have good appetite, and eats everything that is given to her, and now seems to be getting more satisfied with the higher calorie foods. Caregiver states she does not have access to Internet to access online resources.  Preferred Learning Style:   No preference indicated   Learning Readiness:   Ready  MEDICATIONS: reviewed - just taking vitamins   DIETARY INTAKE:  Usual eating pattern includes 4 meals and 1 snacks per day.  Everyday foods include oatmeal.  Avoided foods include lactose.    24-hr recall:  B ( AM): oatmeal made with condensed milk, applesauce, banana  Snk ( AM): ice cream L ( PM): cream potatoes, salad greens, fruit Snk ( PM):  D ( PM): chicken pureed, apples sauce Snk ( PM): ice cream Beverages: water, apple juice  Usual physical activity: none (MS and wheelchair bound)  Estimated energy needs: RD anticipates energy needs higher than typical calculation.  Progress Towards Goal(s):  In progress   Nutritional Diagnosis:  NI-5.7.1 Inadequate protein intake As related to intake not enough to maintain energy needs.   As evidenced by continued weight loss and BMI of 12.(per referral)    Intervention:  Nutrition Education. Discussed ways to increase caloric intake. Discussed limiting plain water. Discussed options for nutritional supplement drinks based on financial concern.  Plan: Play with avocado chocolate pudding recipes, mix in a blender: Ripe avocado, tablespoon of cocoa powder, tablespoon sweetener (maple syrup, or brown sugar), 1 tsp vanilla, coconut milk (about 1/3 c enough to make it smooth) Consider adding nuts to blended food Continue looking at labels to get higher calorie foods.  Teaching Method Utilized:  Visual Auditory  Handouts given during visit include:  Boost coupons  Barriers to learning/adherence to lifestyle change: none  Demonstrated degree of understanding via:  Teach Back   Monitoring/Evaluation:  Dietary intake, exercise, and body weight prn.

## 2018-08-22 NOTE — Patient Instructions (Addendum)
Play with avocado chocolate pudding recipes, mix in a blender: Ripe avocado, tablespoon of cocoa powder, tablespoon sweetener (maple syrup, or brown sugar), 1 tsp vanilla, coconut milk (about 1/3 c enough to make it smooth) Consider adding nuts to blended food Continue looking at labels to get higher calorie foods.

## 2019-03-18 ENCOUNTER — Encounter (HOSPITAL_COMMUNITY): Payer: Self-pay | Admitting: Emergency Medicine

## 2019-03-18 ENCOUNTER — Emergency Department (HOSPITAL_COMMUNITY): Payer: Medicare Other

## 2019-03-18 ENCOUNTER — Other Ambulatory Visit: Payer: Self-pay

## 2019-03-18 ENCOUNTER — Inpatient Hospital Stay (HOSPITAL_COMMUNITY)
Admission: EM | Admit: 2019-03-18 | Discharge: 2019-03-20 | DRG: 385 | Disposition: A | Discharge status: Home Health | Payer: Medicare Other | Attending: Family Medicine | Admitting: Family Medicine

## 2019-03-18 DIAGNOSIS — Z79899 Other long term (current) drug therapy: Secondary | ICD-10-CM | POA: Diagnosis not present

## 2019-03-18 DIAGNOSIS — J9811 Atelectasis: Secondary | ICD-10-CM | POA: Diagnosis present

## 2019-03-18 DIAGNOSIS — A09 Infectious gastroenteritis and colitis, unspecified: Secondary | ICD-10-CM | POA: Diagnosis present

## 2019-03-18 DIAGNOSIS — Z803 Family history of malignant neoplasm of breast: Secondary | ICD-10-CM

## 2019-03-18 DIAGNOSIS — R197 Diarrhea, unspecified: Secondary | ICD-10-CM | POA: Diagnosis present

## 2019-03-18 DIAGNOSIS — F0390 Unspecified dementia without behavioral disturbance: Secondary | ICD-10-CM | POA: Diagnosis present

## 2019-03-18 DIAGNOSIS — Z87891 Personal history of nicotine dependence: Secondary | ICD-10-CM

## 2019-03-18 DIAGNOSIS — R748 Abnormal levels of other serum enzymes: Secondary | ICD-10-CM | POA: Diagnosis present

## 2019-03-18 DIAGNOSIS — Z933 Colostomy status: Secondary | ICD-10-CM

## 2019-03-18 DIAGNOSIS — K5150 Left sided colitis without complications: Principal | ICD-10-CM | POA: Diagnosis present

## 2019-03-18 DIAGNOSIS — R509 Fever, unspecified: Secondary | ICD-10-CM

## 2019-03-18 DIAGNOSIS — Z7901 Long term (current) use of anticoagulants: Secondary | ICD-10-CM | POA: Diagnosis not present

## 2019-03-18 DIAGNOSIS — Z66 Do not resuscitate: Secondary | ICD-10-CM | POA: Diagnosis present

## 2019-03-18 DIAGNOSIS — K529 Noninfective gastroenteritis and colitis, unspecified: Secondary | ICD-10-CM

## 2019-03-18 DIAGNOSIS — Z8674 Personal history of sudden cardiac arrest: Secondary | ICD-10-CM

## 2019-03-18 DIAGNOSIS — D649 Anemia, unspecified: Secondary | ICD-10-CM

## 2019-03-18 DIAGNOSIS — M810 Age-related osteoporosis without current pathological fracture: Secondary | ICD-10-CM | POA: Diagnosis present

## 2019-03-18 DIAGNOSIS — Z9049 Acquired absence of other specified parts of digestive tract: Secondary | ICD-10-CM | POA: Diagnosis not present

## 2019-03-18 DIAGNOSIS — Z20828 Contact with and (suspected) exposure to other viral communicable diseases: Secondary | ICD-10-CM | POA: Diagnosis present

## 2019-03-18 DIAGNOSIS — Z7982 Long term (current) use of aspirin: Secondary | ICD-10-CM | POA: Diagnosis not present

## 2019-03-18 DIAGNOSIS — K859 Acute pancreatitis without necrosis or infection, unspecified: Secondary | ICD-10-CM

## 2019-03-18 DIAGNOSIS — E871 Hypo-osmolality and hyponatremia: Secondary | ICD-10-CM | POA: Diagnosis present

## 2019-03-18 DIAGNOSIS — Z9011 Acquired absence of right breast and nipple: Secondary | ICD-10-CM | POA: Diagnosis not present

## 2019-03-18 DIAGNOSIS — I959 Hypotension, unspecified: Secondary | ICD-10-CM | POA: Diagnosis present

## 2019-03-18 DIAGNOSIS — D638 Anemia in other chronic diseases classified elsewhere: Secondary | ICD-10-CM | POA: Diagnosis present

## 2019-03-18 DIAGNOSIS — L89312 Pressure ulcer of right buttock, stage 2: Secondary | ICD-10-CM | POA: Diagnosis present

## 2019-03-18 DIAGNOSIS — Z853 Personal history of malignant neoplasm of breast: Secondary | ICD-10-CM

## 2019-03-18 DIAGNOSIS — G35 Multiple sclerosis: Secondary | ICD-10-CM | POA: Diagnosis present

## 2019-03-18 DIAGNOSIS — R001 Bradycardia, unspecified: Secondary | ICD-10-CM | POA: Diagnosis present

## 2019-03-18 LAB — COMPREHENSIVE METABOLIC PANEL
ALT: 12 U/L (ref 0–44)
AST: 16 U/L (ref 15–41)
Albumin: 3.7 g/dL (ref 3.5–5.0)
Alkaline Phosphatase: 47 U/L (ref 38–126)
Anion gap: 9 (ref 5–15)
BUN: 13 mg/dL (ref 8–23)
CO2: 27 mmol/L (ref 22–32)
Calcium: 9.2 mg/dL (ref 8.9–10.3)
Chloride: 93 mmol/L — ABNORMAL LOW (ref 98–111)
Creatinine, Ser: 0.91 mg/dL (ref 0.44–1.00)
GFR calc Af Amer: >60 mL/min (ref >60)
GFR calc non Af Amer: >60 mL/min (ref >60)
Glucose, Bld: 97 mg/dL (ref 70–99)
Potassium: 4 mmol/L (ref 3.5–5.1)
Sodium: 129 mmol/L — ABNORMAL LOW (ref 135–145)
Total Bilirubin: 0.7 mg/dL (ref 0.3–1.2)
Total Protein: 7.2 g/dL (ref 6.5–8.1)

## 2019-03-18 LAB — CBC WITH DIFFERENTIAL/PLATELET
Abs Immature Granulocytes: 0.02 10*3/uL (ref 0.00–0.07)
Basophils Absolute: 0.1 10*3/uL (ref 0.0–0.1)
Basophils Relative: 1 %
Eosinophils Absolute: 0.1 10*3/uL (ref 0.0–0.5)
Eosinophils Relative: 2 %
HCT: 33.1 % — ABNORMAL LOW (ref 36.0–46.0)
Hemoglobin: 10.7 g/dL — ABNORMAL LOW (ref 12.0–15.0)
Immature Granulocytes: 0 %
Lymphocytes Relative: 40 %
Lymphs Abs: 2 10*3/uL (ref 0.7–4.0)
MCH: 30.4 pg (ref 26.0–34.0)
MCHC: 32.3 g/dL (ref 30.0–36.0)
MCV: 94 fL (ref 80.0–100.0)
Monocytes Absolute: 0.7 10*3/uL (ref 0.1–1.0)
Monocytes Relative: 14 %
Neutro Abs: 2.1 10*3/uL (ref 1.7–7.7)
Neutrophils Relative %: 43 %
Platelets: 190 10*3/uL (ref 150–400)
RBC: 3.52 MIL/uL — ABNORMAL LOW (ref 3.87–5.11)
RDW: 13.1 % (ref 11.5–15.5)
WBC: 5 10*3/uL (ref 4.0–10.5)
nRBC: 0 % (ref 0.0–0.2)

## 2019-03-18 LAB — LACTIC ACID, PLASMA
Lactic Acid, Venous: 0.9 mmol/L (ref 0.5–1.9)
Lactic Acid, Venous: 0.9 mmol/L (ref 0.5–1.9)

## 2019-03-18 LAB — IRON AND TIBC
Iron: 27 ug/dL — ABNORMAL LOW (ref 28–170)
Saturation Ratios: 9 % — ABNORMAL LOW (ref 10.4–31.8)
TIBC: 310 ug/dL (ref 250–450)
UIBC: 283 ug/dL

## 2019-03-18 LAB — SARS CORONAVIRUS 2 BY RT PCR (HOSPITAL ORDER, PERFORMED IN ~~LOC~~ HOSPITAL LAB): SARS Coronavirus 2: NEGATIVE

## 2019-03-18 LAB — PROTIME-INR
INR: 1.1 (ref 0.8–1.2)
Prothrombin Time: 14.1 seconds (ref 11.4–15.2)

## 2019-03-18 LAB — RETICULOCYTES
Immature Retic Fract: 9.2 % (ref 2.3–15.9)
RBC.: 3.55 MIL/uL — ABNORMAL LOW (ref 3.87–5.11)
Retic Count, Absolute: 30.9 10*3/uL (ref 19.0–186.0)
Retic Ct Pct: 0.9 % (ref 0.4–3.1)

## 2019-03-18 LAB — VITAMIN B12: Vitamin B-12: 934 pg/mL — ABNORMAL HIGH (ref 180–914)

## 2019-03-18 LAB — FOLATE: Folate: 23.6 ng/mL (ref >5.9)

## 2019-03-18 LAB — FERRITIN: Ferritin: 51 ng/mL (ref 11–307)

## 2019-03-18 LAB — APTT: aPTT: 31 seconds (ref 24–36)

## 2019-03-18 LAB — LIPASE, BLOOD: Lipase: 54 U/L — ABNORMAL HIGH (ref 11–51)

## 2019-03-18 MED ORDER — SODIUM CHLORIDE 0.9 % IV BOLUS
500.0000 mL | Freq: Once | INTRAVENOUS | Status: AC
  Administered 2019-03-18: 500 mL via INTRAVENOUS

## 2019-03-18 MED ORDER — PIPERACILLIN-TAZOBACTAM 3.375 G IVPB
3.3750 g | Freq: Three times a day (TID) | INTRAVENOUS | Status: DC
  Administered 2019-03-19 – 2019-03-20 (×5): 3.375 g via INTRAVENOUS
  Filled 2019-03-18 (×7): qty 50

## 2019-03-18 MED ORDER — SODIUM CHLORIDE 0.9 % IV SOLN
2.0000 g | Freq: Once | INTRAVENOUS | Status: AC
  Administered 2019-03-18: 14:00:00 2 g via INTRAVENOUS
  Filled 2019-03-18: qty 20

## 2019-03-18 MED ORDER — ACETAMINOPHEN 325 MG PO TABS: 650.0000 mg | ORAL_TABLET | ORAL | Status: DC | PRN

## 2019-03-18 MED ORDER — ASPIRIN 81 MG PO CHEW
81.0000 mg | CHEWABLE_TABLET | Freq: Every day | ORAL | Status: DC
  Administered 2019-03-19 – 2019-03-20 (×2): 81 mg via ORAL
  Filled 2019-03-18 (×2): qty 1

## 2019-03-18 MED ORDER — SODIUM CHLORIDE 0.9 % IV SOLN
INTRAVENOUS | Status: DC
  Administered 2019-03-18 – 2019-03-20 (×3): via INTRAVENOUS

## 2019-03-18 MED ORDER — ONDANSETRON HCL 4 MG/2ML IJ SOLN
4.0000 mg | Freq: Four times a day (QID) | INTRAMUSCULAR | Status: DC | PRN
  Administered 2019-03-19: 10:00:00 4 mg via INTRAVENOUS
  Filled 2019-03-18: qty 2

## 2019-03-18 MED ORDER — PIPERACILLIN-TAZOBACTAM 3.375 G IVPB 30 MIN
3.3750 g | INTRAVENOUS | Status: AC
  Administered 2019-03-18: 3.375 g via INTRAVENOUS
  Filled 2019-03-18: qty 50

## 2019-03-18 MED ORDER — ENOXAPARIN SODIUM 40 MG/0.4ML ~~LOC~~ SOLN
40.0000 mg | Freq: Every day | SUBCUTANEOUS | Status: DC
  Administered 2019-03-19: 40 mg via SUBCUTANEOUS
  Filled 2019-03-18: qty 0.4

## 2019-03-18 MED ORDER — IOHEXOL 300 MG/ML  SOLN
100.0000 mL | Freq: Once | INTRAMUSCULAR | Status: AC | PRN
  Administered 2019-03-18: 100 mL via INTRAVENOUS

## 2019-03-18 MED ORDER — METRONIDAZOLE IN NACL 5-0.79 MG/ML-% IV SOLN
500.0000 mg | Freq: Once | INTRAVENOUS | Status: AC
  Administered 2019-03-18: 500 mg via INTRAVENOUS
  Filled 2019-03-18: qty 100

## 2019-03-18 MED ORDER — SODIUM CHLORIDE (PF) 0.9 % IJ SOLN
INTRAMUSCULAR | Status: AC
  Filled 2019-03-18: qty 50

## 2019-03-18 NOTE — Progress Notes (Signed)
Pharmacy Antibiotic Note  Melissa Oconnell is a 63 y.o. female admitted on 03/18/2019 with intra-abdominal infection.  PMH significant for dementia, MS.  Patient with colostomy and reports diarrhea. Patient received Ceftriaxone 2gm and Metronidazole 500mg  IV x 1 dose each in the ED.   Chest Xray shows possible pneumonia; CT abdomen shows suspected colitis, enteritis.  Upon admission, Pharmacy has been consulted for Zosyn dosing.  Plan: Zosyn 3.375g IV q8h (4 hour infusion). Need for further dosage adjustment appears unlikely at present.    Will sign off at this time.  Please reconsult if a change in clinical status warrants re-evaluation of dosage.    Height: 6' (182.9 cm) Weight: 135 lb (61.2 kg) IBW/kg (Calculated) : 73.1  Temp (24hrs), Avg:99.4 F (37.4 C), Min:99.4 F (37.4 C), Max:99.4 F (37.4 C)  Recent Labs  Lab 03/18/19 1230  WBC 5.0  CREATININE 0.91  LATICACIDVEN 0.9    Estimated Creatinine Clearance: 61.9 mL/min (by C-G formula based on SCr of 0.91 mg/dL).    No Known Allergies  Antimicrobials this admission: 9/1 Ceftriaxone x 1  9/1 Metronidazole x 1  9/1 Zosyn >>  Thank you for allowing pharmacy to be a part of this patient's care.  Everette Rank, PharmD 03/18/2019 4:28 PM

## 2019-03-18 NOTE — ED Notes (Signed)
ED TO INPATIENT HANDOFF REPORT  ED Nurse Name and Phone #: Joellen Jersey [phone]  S Name/Age/Gender Melissa Oconnell 63 y.o. female Room/Bed: WA06/WA06  Code Status   Code Status: Prior  Home/SNF/Other Nursing Home Patient oriented to: self, place, time and situation Is this baseline? Yes   Triage Complete: Triage complete  Chief Complaint diarrhea fever  Triage Note EMS states pt has been febrile for several days, 101.5 after morning administration of Tylenol. Pt has a colostomy bag and has been experiencing diarrhea. Pt has dementia.  BP 90/66 HR 69 RR 18 SpO2 94 on RA CBG 132 Temp 101.5   Allergies No Known Allergies  Level of Care/Admitting Diagnosis ED Disposition    ED Disposition Condition Comment   Admit  Hospital Area: [facility] [[id number]]  Level of Care: Med-Surg [16]  Covid Evaluation: Asymptomatic Screening Protocol (No Symptoms)  Diagnosis: Colitis [id number]  Admitting Physician: Hosie Poisson [[id number]]  Attending Physician: Hosie Poisson [[id number]]  Estimated length of stay: past midnight tomorrow  Certification:: I certify this patient will need inpatient services for at least 2 midnights  PT Class (Do Not Modify): Inpatient [101]  PT Acc Code (Do Not Modify): Private [1]       B Medical/Surgery History Past Medical History:  Diagnosis Date  . Abnormality of gait   . Cancer ([place])    breast  . Memory loss   . Multiple sclerosis ([place])   . Neuromuscular disorder ([place])    multiple sclerosis  . Osteoporosis, unspecified 01/06/2014   Past Surgical History:  Procedure Laterality Date  . DIAGNOSTIC LAPAROSCOPY     "swallowed something"  . LAPAROTOMY N/A 12/10/2016   Procedure: EXPLORATORY LAPAROTOMY WITH SIGMOID COLECTOMY ANT HARTMAN'S POUCH;  Surgeon: Erroll Luna, MD;  Location: [facility];  Service: General;  Laterality: N/A;  . MASTECTOMY Right   . MASTECTOMY W/ SENTINEL NODE BIOPSY Right 12/02/2013   Procedure: TOTAL MASTECTOMY WITH  SENTINEL LYMPH NODE BIOPSY;  Surgeon: Adin Hector, MD;  Location: [facility];  Service: General;  Laterality: Right;  . TONSILLECTOMY    . TRACHEOSTOMY  12/2016   Archie Endo 01/26/2017     A IV Location/Drains/Wounds Patient Lines/Drains/Airways Status   Active Line/Drains/Airways    Name:   Placement date:   Placement time:   Site:   Days:   Peripheral IV 03/18/19 Right Wrist   03/18/19    1224    Wrist   less than 1   CVC Triple Lumen 12/14/16 Right Internal jugular   12/14/16    1915     824   Closed System Drain 1 Right Breast Bulb (JP) 19 Fr.   12/02/13    1023    Breast   1932   Closed System Drain 2 Right Breast Bulb (JP) 19 Fr.   12/02/13    1023    Breast   1932   Colostomy LLQ   12/10/16    1500    LLQ   828   Incision (Closed) 12/02/13 Chest Right   12/02/13    0900     1932   Incision (Closed) 12/10/16 Abdomen   12/10/16    1421     828   Incision (Closed) 01/26/17 Abdomen Mid   01/26/17    2230     781   Tracheostomy Shiley 6 mm Cuffed   12/21/16    1340    6 mm   817   Tracheostomy Other (Comment)   -    -    -  Intake/Output Last 24 hours  Intake/Output Summary (Last 24 hours) at 03/18/2019 2129 Last data filed at 03/18/2019 1821 Gross per 24 hour  Intake 800 ml  Output -  Net 800 ml    Labs/Imaging Results for orders placed or performed during the hospital encounter of 03/18/19 (from the past 48 hour(s))  SARS Coronavirus 2 [facility] order, Performed in [facility] hospital lab) Nasopharyngeal Nasopharyngeal Swab     Status: None   Collection Time: 03/18/19 12:01 PM   Specimen: Nasopharyngeal Swab  Result Value Ref Range   SARS Coronavirus 2 NEGATIVE NEGATIVE    Comment: (NOTE) If result is NEGATIVE SARS-CoV-2 target nucleic acids are NOT DETECTED. The SARS-CoV-2 RNA is generally detectable in upper and lower  respiratory specimens during the acute phase of infection. The lowest  concentration of SARS-CoV-2 viral copies this assay can detect is 250   copies / mL. A negative result does not preclude SARS-CoV-2 infection  and should not be used as the sole basis for treatment or other  patient management decisions.  A negative result may occur with  improper specimen collection / handling, submission of specimen other  than nasopharyngeal swab, presence of viral mutation(s) within the  areas targeted by this assay, and inadequate number of viral copies  (<250 copies / mL). A negative result must be combined with clinical  observations, patient history, and epidemiological information. If result is POSITIVE SARS-CoV-2 target nucleic acids are DETECTED. The SARS-CoV-2 RNA is generally detectable in upper and lower  respiratory specimens dur ing the acute phase of infection.  Positive  results are indicative of active infection with SARS-CoV-2.  Clinical  correlation with patient history and other diagnostic information is  necessary to determine patient infection status.  Positive results do  not rule out bacterial infection or co-infection with other viruses. If result is PRESUMPTIVE POSTIVE SARS-CoV-2 nucleic acids MAY BE PRESENT.   A presumptive positive result was obtained on the submitted specimen  and confirmed on repeat testing.  While 2019 novel coronavirus  (SARS-CoV-2) nucleic acids may be present in the submitted sample  additional confirmatory testing may be necessary for epidemiological  and / or clinical management purposes  to differentiate between  SARS-CoV-2 and other Sarbecovirus currently known to infect humans.  If clinically indicated additional testing with an alternate test  methodology [phone]) is advised. The SARS-CoV-2 RNA is generally  detectable in upper and lower respiratory sp ecimens during the acute  phase of infection. The expected result is Negative. Fact Sheet for Patients:  StrictlyIdeas.no Fact Sheet for Healthcare  Providers: BankingDealers.co.za This test is not yet approved or cleared by the [place] FDA and has been authorized for detection and/or diagnosis of SARS-CoV-2 by FDA under an Emergency Use Authorization (EUA).  This EUA will remain in effect (meaning this test can be used) for the duration of the COVID-19 declaration under Section 564(b)(1) of the Act, 21 U.S.C. section 360bbb-3(b)(1), unless the authorization is terminated or revoked sooner. Performed at [facility], [place] [address], [place], [place] [address]   Lactic acid, plasma     Status: None   Collection Time: 03/18/19 12:30 PM  Result Value Ref Range   Lactic Acid, Venous 0.9 0.5 - 1.9 mmol/L    Comment: Performed at [facility], [place] [address], [place], [place] [address]  CBC WITH DIFFERENTIAL     Status: Abnormal   Collection Time: 03/18/19 12:30 PM  Result Value Ref Range   WBC  5.0 4.0 - 10.5 K/uL   RBC 3.52 (L) 3.87 - 5.11 MIL/uL   Hemoglobin 10.7 (L) 12.0 - 15.0 g/dL   HCT 33.1 (L) 36.0 - 46.0 %   MCV 94.0 80.0 - 100.0 fL   MCH 30.4 26.0 - 34.0 pg   MCHC 32.3 30.0 - 36.0 g/dL   RDW 13.1 11.5 - 15.5 %   Platelets 190 150 - 400 K/uL   nRBC 0.0 0.0 - 0.2 %   Neutrophils Relative % 43 %   Neutro Abs 2.1 1.7 - 7.7 K/uL   Lymphocytes Relative 40 %   Lymphs Abs 2.0 0.7 - 4.0 K/uL   Monocytes Relative 14 %   Monocytes Absolute 0.7 0.1 - 1.0 K/uL   Eosinophils Relative 2 %   Eosinophils Absolute 0.1 0.0 - 0.5 K/uL   Basophils Relative 1 %   Basophils Absolute 0.1 0.0 - 0.1 K/uL   Immature Granulocytes 0 %   Abs Immature Granulocytes 0.02 0.00 - 0.07 K/uL    Comment: Performed at [facility], [place] [address], [place], [place] [address]  APTT     Status: None   Collection Time: 03/18/19 12:30 PM  Result Value Ref Range   aPTT 31 24 - 36 seconds    Comment: Performed at [facility], [place] [address]  [place], [place] [address]  Protime-INR     Status: None   Collection Time: 03/18/19 12:30 PM  Result Value Ref Range   Prothrombin Time 14.1 11.4 - 15.2 seconds   INR 1.1 0.8 - 1.2    Comment: (NOTE) INR goal varies based on device and disease states. Performed at [facility], [place] [address] [place], [place] [address]   Comprehensive metabolic panel     Status: Abnormal   Collection Time: 03/18/19 12:30 PM  Result Value Ref Range   Sodium 129 (L) 135 - 145 mmol/L   Potassium 4.0 3.5 - 5.1 mmol/L   Chloride 93 (L) 98 - 111 mmol/L   CO2 27 22 - 32 mmol/L   Glucose, Bld 97 70 - 99 mg/dL   BUN 13 8 - 23 mg/dL   Creatinine, Ser 0.91 0.44 - 1.00 mg/dL   Calcium 9.2 8.9 - 10.3 mg/dL   Total Protein 7.2 6.5 - 8.1 g/dL   Albumin 3.7 3.5 - 5.0 g/dL   AST 16 15 - 41 U/L   ALT 12 0 - 44 U/L   Alkaline Phosphatase 47 38 - 126 U/L   Total Bilirubin 0.7 0.3 - 1.2 mg/dL   GFR calc non Af Amer >60 >60 mL/min   GFR calc Af Amer >60 >60 mL/min   Anion gap 9 5 - 15    Comment: Performed at [facility], [place] [address], [place], [place] [address]  Lipase, blood     Status: Abnormal   Collection Time: 03/18/19 12:30 PM  Result Value Ref Range   Lipase 54 (H) 11 - 51 U/L    Comment: Performed at [facility], [place] [address], [place],  [address]  Reticulocytes     Status: Abnormal   Collection Time: 03/18/19 12:30 PM  Result Value Ref Range   Retic Ct Pct 0.9 0.4 - 3.1 %   RBC. 3.55 (L) 3.87 - 5.11 MIL/uL   Retic Count, Absolute 30.9 19.0 - 186.0 K/uL   Immature Retic Fract 9.2 2.3 - 15.9 %    Comment: Performed at [facility],  [place] [address] [place], [place] [address]   Ct Abdomen Pelvis W Contrast  Result Date: 03/18/2019 CLINICAL DATA:  63 year old female with acute abdominal and pelvic pain, fever and diarrhea. History of sigmoid colectomy and Hartmann's pouch. EXAM: CT ABDOMEN AND PELVIS WITH CONTRAST  TECHNIQUE: Multidetector CT imaging of the abdomen and pelvis was performed using the standard protocol following bolus administration of intravenous contrast. CONTRAST:  145mL OMNIPAQUE IOHEXOL 300 MG/ML  SOLN COMPARISON:  01/03/2017 CT FINDINGS: Lower chest: A small pericardial effusion is again noted. A trace LEFT pleural effusion has decreased. LEFT basilar atelectasis/scarring noted. Hepatobiliary: The liver and gallbladder are unremarkable except for perfusion difference within the posterior RIGHT liver. No biliary dilatation. Pancreas: Unremarkable Spleen: Unremarkable Adrenals/Urinary Tract: The kidneys, adrenal glands and bladder are unremarkable. Stomach/Bowel: LEFT sided colostomy noted. There is mild circumferential wall thickening of the LEFT colon compatible with a colitis. Wall thickening of several small bowel loops within the LEFT abdomen are noted and suspicious for an enteritis. There is no evidence of bowel obstruction, abscess or pneumoperitoneum. Vascular/Lymphatic: No significant vascular findings are present. The visualized mesenteric vessels are patent. No enlarged abdominal or pelvic lymph nodes. Reproductive: Uterus and bilateral adnexa are unremarkable. Other: No ascites. Musculoskeletal: No acute or suspicious bony abnormalities. IMPRESSION: 1. LEFT colonic wall thickening compatible with colitis. 2. Wall thickening of LEFT abdominal small bowel loops likely representing an enteritis. 3. No evidence of bowel obstruction or pneumoperitoneum. 4. Stable small pericardial effusion, decreased trace LEFT pleural effusion and LEFT basilar atelectasis/scarring. Electronically Signed   By: Margarette Canada M.D.   On: 03/18/2019 14:58   Dg Chest Port 1 View  Result Date: 03/18/2019 CLINICAL DATA:  Fever for several days. EXAM: PORTABLE CHEST 1 VIEW COMPARISON:  PA and lateral chest 12/27/2016. FINDINGS: Left basilar airspace opacity is identified. Right lung clear. Heart size normal. No pneumothorax  or pleural effusion. IMPRESSION: Left basilar airspace opacity could be due to atelectasis or pneumonia. Electronically Signed   By: Inge Rise M.D.   On: 03/18/2019 13:15    Pending Labs Unresulted Labs (From admission, onward)    Start     Ordered   03/19/19 [id number]  Basic metabolic panel  Tomorrow morning,   R     03/18/19 1815   03/19/19 0500  CBC  Tomorrow morning,   R     03/18/19 1815   03/19/19 0500  Lipase, blood  Tomorrow morning,   R     03/18/19 1816   03/18/19 1817  Vitamin B12  (Anemia Panel (PNL))  Add-on,   AD     03/18/19 1816   03/18/19 1817  Folate  (Anemia Panel (PNL))  Add-on,   AD     03/18/19 1816   03/18/19 1817  Iron and TIBC  (Anemia Panel (PNL))  Add-on,   AD     03/18/19 1816   03/18/19 1817  Ferritin  (Anemia Panel (PNL))  Add-on,   AD     03/18/19 1816   03/18/19 1620  Gastrointestinal Panel by PCR , Stool  (Gastrointestinal Panel by PCR, Stool                                                                                                                                                     *  Does Not include CLOSTRIDIUM DIFFICILE testing.**If CDIFF testing is needed, select the C Difficile Quick Screen w PCR reflex order below)  Once,   STAT     03/18/19 1620   03/18/19 1154  Lactic acid, plasma  Now then every 2 hours,   STAT     03/18/19 1154   03/18/19 1154  Blood Culture (routine x 2)  BLOOD CULTURE X 2,   STAT     03/18/19 1154   03/18/19 1154  Urinalysis, Routine w reflex microscopic  ONCE - STAT,   STAT     03/18/19 1154   03/18/19 1154  Urine culture  ONCE - STAT,   STAT     03/18/19 1154   Signed and Held  HIV antibody (Routine Testing)  Once,   R     Signed and Held   Signed and Held  Creatinine, serum  (enoxaparin (LOVENOX)    CrCl >/= 30 ml/min)  Weekly,   R    Comments: while on enoxaparin therapy    Signed and Held          Vitals/Pain Today's Vitals   03/18/19 1918 03/18/19 2000 03/18/19 2030 03/18/19 2101  BP:  91/65 113/76  102/86  Pulse:  (!) 50 (!) 54 (!) 58  Resp:  18 20 14   Temp:    98.1 F (36.7 C)  TempSrc:    Oral  SpO2:  90% 100% 100%  Weight:      Height:      PainSc: 0-No pain       Isolation Precautions Enteric precautions (UV disinfection)  Medications Medications  sodium chloride (PF) 0.9 % injection (has no administration in time range)  piperacillin-tazobactam (ZOSYN) IVPB 3.375 g (has no administration in time range)  0.9 %  sodium chloride infusion ( Intravenous New Bag/Given 03/18/19 2042)  ondansetron (ZOFRAN) injection 4 mg (has no administration in time range)  acetaminophen (TYLENOL) tablet 650 mg (has no administration in time range)  sodium chloride 0.9 % bolus 500 mL (0 mLs Intravenous Stopped 03/18/19 1345)  cefTRIAXone (ROCEPHIN) 2 g in sodium chloride 0.9 % 100 mL IVPB (0 g Intravenous Stopped 03/18/19 1345)  metroNIDAZOLE (FLAGYL) IVPB 500 mg (0 mg Intravenous Stopped 03/18/19 1546)  iohexol (OMNIPAQUE) 300 MG/ML solution 100 mL (100 mLs Intravenous Contrast Given 03/18/19 1423)  piperacillin-tazobactam (ZOSYN) IVPB 3.375 g (0 g Intravenous Stopped 03/18/19 1821)    Mobility non-ambulatory Moderate fall risk   Focused Assessments NA   R Recommendations: See Admitting Provider Note  Report given to:   Additional Notes: NA

## 2019-03-18 NOTE — ED Notes (Signed)
Assisted pt to bedpan. Pt unable to urinate. Changed adult brief.

## 2019-03-18 NOTE — H&P (Signed)
History and Physical    ELDEEN Oconnell [id number] DOB: [birth year] DOA: 03/18/2019  PCP: [facility]  Patient coming from: home.   I have personally briefly reviewed patient's old medical records in [place]  Chief Complaint: diarrhea for 3 to 4 days,  And increased sleepiness in 1 to 2 days.   HPI: Melissa Oconnell is a 63 y.o. female with medical history significant of MS, dementia, left sided weakness,  right side breast cancer s/p mastectomy in 2015, was brought in by her care giver for loose bowel movements since 3 to 4 days. No fevers or chills. Pt is a poor historian and most of the history was obtained from the EDP notes and her caregiver over the phone. The caregiver reports diarrhea, liquid brown stool in the colostomy bag, no vomiting.  No fevers or chills.  No syncopal episodes. No melena or hematochezia.  And patient denies any other complaints. ED Course: On arrival to ED patient has low-grade temp of 99.4, bradycardic with heart rate of 52/min, slightly hypotensive with blood pressure of 88/62 improved to 123/67.  Lab work is significant for sodium of 129, chloride of 93, lipase of 54, hemoglobin of 10.7, hematocrit of 33.1.  COVID-19 screening test is negative, blood cultures ordered and are pending, CT abdomen and pelvis with contrast shows left-sided colitis and left-sided enteritis. No SBO.    Patient was referred to medical service for admission for hydration and management of left-sided colitis and enteritis with IV antibiotics.  She was given 500 mL of normal saline and a dose of Rocephin and Flagyl given in ED.  Review of Systems:  Couldn't be obtained   Past Medical History:  Diagnosis Date  . Abnormality of gait   . Cancer ([place])    breast  . Memory loss   . Multiple sclerosis ([place])   . Neuromuscular disorder ([place])    multiple sclerosis  . Osteoporosis, unspecified 01/06/2014    Past Surgical History:  Procedure Laterality Date   . DIAGNOSTIC LAPAROSCOPY     "swallowed something"  . LAPAROTOMY N/A 12/10/2016   Procedure: EXPLORATORY LAPAROTOMY WITH SIGMOID COLECTOMY ANT HARTMAN'S POUCH;  Surgeon: Erroll Luna, MD;  Location: [facility];  Service: General;  Laterality: N/A;  . MASTECTOMY Right   . MASTECTOMY W/ SENTINEL NODE BIOPSY Right 12/02/2013   Procedure: TOTAL MASTECTOMY WITH SENTINEL LYMPH NODE BIOPSY;  Surgeon: Adin Hector, MD;  Location: [facility];  Service: General;  Laterality: Right;  . TONSILLECTOMY    . TRACHEOSTOMY  12/2016   Archie Endo 01/26/2017   Social history:   reports that she has quit smoking. Her smoking use included cigarettes. She has a 5.00 pack-year smoking history. She has never used smokeless tobacco. She reports current alcohol use. She reports current drug use. Drug: Cocaine.  No Known Allergies  Family History  Problem Relation Age of Onset  . Cancer Mother        breast   Family history couldn't be reviewed due to confusion.   Prior to Admission medications   Medication Sig Start Date End Date Taking? Authorizing Provider  aspirin 81 MG chewable tablet Chew 81 mg by mouth daily.   Yes [provider]  Multiple Vitamin (MULTIVITAMIN WITH MINERALS) TABS tablet Take 1 tablet by mouth daily.   Yes [provider]  Probiotic Product (PROBIOTIC PO) Take by mouth daily.   Yes [provider]  saccharomyces boulardii (FLORASTOR) 250 MG capsule Take 250 mg  by mouth 2 (two) times daily.   Yes [provider]  apixaban (ELIQUIS) 5 MG TABS tablet Take 1 tablet (5 mg total) by mouth 2 (two) times daily. Patient not taking: Reported on 07/25/2018 12/31/16   Rhodia Albright, NP    Physical Exam: Vitals:   03/18/19 1630 03/18/19 1700 03/18/19 1715 03/18/19 1730  BP: 109/85 123/67  113/78  Pulse: (!) 51 (!) 48 (!) 50 (!) 52  Resp: (!) 26 17 (!) 22 15  Temp:      TempSrc:      SpO2: 100% 100% 100% 100%  Weight:      Height:        Constitutional: NAD,  calm, comfortable Vitals:   03/18/19 1630 03/18/19 1700 03/18/19 1715 03/18/19 1730  BP: 109/85 123/67  113/78  Pulse: (!) 51 (!) 48 (!) 50 (!) 52  Resp: (!) 26 17 (!) 22 15  Temp:      TempSrc:      SpO2: 100% 100% 100% 100%  Weight:      Height:       Eyes: PERRL, lids and conjunctivae normal ENMT: Mucous membranes are dry.  Neck: normal, supple, no masses,  Respiratory: clear to auscultation bilaterally, no wheezing or rhonchi.  Cardiovascular: bradycardic, no murmer's appreciated. S1S2, heard.  Abdomen: abdomen is soft , non tender non distended,  Left lower colostomy bag , with brown liquid stool .  Bowel sounds positive.  Musculoskeletal: no clubbing / cyanosis.  No pedal edema.  Skin: no rashes, lesions,  Neurologic: pt confused, but alert and able to answer simple questions. Able to move all extremities. Gait couldn't be tested.  Psychiatric:  Pt alert , pleasant, not agitated.   Labs on Admission: I have personally reviewed following labs and imaging studies  CBC: Recent Labs  Lab 03/18/19 1230  WBC 5.0  NEUTROABS 2.1  HGB 10.7*  HCT 33.1*  MCV 94.0  PLT 99991111   Basic Metabolic Panel: Recent Labs  Lab 03/18/19 1230  NA 129*  K 4.0  CL 93*  CO2 27  GLUCOSE 97  BUN 13  CREATININE 0.91  CALCIUM 9.2   GFR: Estimated Creatinine Clearance: 61.9 mL/min (by C-G formula based on SCr of 0.91 mg/dL). Liver Function Tests: Recent Labs  Lab 03/18/19 1230  AST 16  ALT 12  ALKPHOS 47  BILITOT 0.7  PROT 7.2  ALBUMIN 3.7   Recent Labs  Lab 03/18/19 1230  LIPASE 54*   No results for input(s): AMMONIA in the last 168 hours. Coagulation Profile: Recent Labs  Lab 03/18/19 1230  INR 1.1   Cardiac Enzymes: No results for input(s): CKTOTAL, CKMB, CKMBINDEX, TROPONINI in the last 168 hours. BNP (last 3 results) No results for input(s): PROBNP in the last 8760 hours. HbA1C: No results for input(s): HGBA1C in the last 72 hours. CBG: No results for  input(s): GLUCAP in the last 168 hours. Lipid Profile: No results for input(s): CHOL, HDL, LDLCALC, TRIG, CHOLHDL, LDLDIRECT in the last 72 hours. Thyroid Function Tests: No results for input(s): TSH, T4TOTAL, FREET4, T3FREE, THYROIDAB in the last 72 hours. Anemia Panel: No results for input(s): VITAMINB12, FOLATE, FERRITIN, TIBC, IRON, RETICCTPCT in the last 72 hours. Urine analysis:    Component Value Date/Time   COLORURINE YELLOW 01/03/2017 [place] 01/03/2017 1205   LABSPEC 1.008 01/03/2017 1205   PHURINE 6.0 01/03/2017 1205   GLUCOSEU NEGATIVE 01/03/2017 1205   [place] 01/03/2017 1205   [place]  01/03/2017 [place] 01/03/2017 1205   PROTEINUR NEGATIVE 01/03/2017 1205   UROBILINOGEN 0.2 12/29/2014 1401   NITRITE NEGATIVE 01/03/2017 1205   LEUKOCYTESUR TRACE (A) 01/03/2017 1205    Radiological Exams on Admission: Ct Abdomen Pelvis W Contrast  Result Date: 03/18/2019 CLINICAL DATA:  63 year old female with acute abdominal and pelvic pain, fever and diarrhea. History of sigmoid colectomy and Hartmann's pouch. EXAM: CT ABDOMEN AND PELVIS WITH CONTRAST TECHNIQUE: Multidetector CT imaging of the abdomen and pelvis was performed using the standard protocol following bolus administration of intravenous contrast. CONTRAST:  177mL OMNIPAQUE IOHEXOL 300 MG/ML  SOLN COMPARISON:  01/03/2017 CT FINDINGS: Lower chest: A small pericardial effusion is again noted. A trace LEFT pleural effusion has decreased. LEFT basilar atelectasis/scarring noted. Hepatobiliary: The liver and gallbladder are unremarkable except for perfusion difference within the posterior RIGHT liver. No biliary dilatation. Pancreas: Unremarkable Spleen: Unremarkable Adrenals/Urinary Tract: The kidneys, adrenal glands and bladder are unremarkable. Stomach/Bowel: LEFT sided colostomy noted. There is mild circumferential wall thickening of the LEFT colon compatible with a colitis.  Wall thickening of several small bowel loops within the LEFT abdomen are noted and suspicious for an enteritis. There is no evidence of bowel obstruction, abscess or pneumoperitoneum. Vascular/Lymphatic: No significant vascular findings are present. The visualized mesenteric vessels are patent. No enlarged abdominal or pelvic lymph nodes. Reproductive: Uterus and bilateral adnexa are unremarkable. Other: No ascites. Musculoskeletal: No acute or suspicious bony abnormalities. IMPRESSION: 1. LEFT colonic wall thickening compatible with colitis. 2. Wall thickening of LEFT abdominal small bowel loops likely representing an enteritis. 3. No evidence of bowel obstruction or pneumoperitoneum. 4. Stable small pericardial effusion, decreased trace LEFT pleural effusion and LEFT basilar atelectasis/scarring. Electronically Signed   By: Margarette Canada M.D.   On: 03/18/2019 14:58   Dg Chest Port 1 View  Result Date: 03/18/2019 CLINICAL DATA:  Fever for several days. EXAM: PORTABLE CHEST 1 VIEW COMPARISON:  PA and lateral chest 12/27/2016. FINDINGS: Left basilar airspace opacity is identified. Right lung clear. Heart size normal. No pneumothorax or pleural effusion. IMPRESSION: Left basilar airspace opacity could be due to atelectasis or pneumonia. Electronically Signed   By: Inge Rise M.D.   On: 03/18/2019 13:15    EKG: Independently reviewed. Not done.   Assessment/Plan Active Problems:   Colitis   Left-sided colitis and enteritis Possibly infectious. Admit for IV antibiotics started the patient on IV Zosyn, symptomatic management with IV fluids, IV antiemetics.  Send GI pathogen for PCR.  Clear liquid diet and advance diet as tolerated.   Left-sided opacity on chest x-ray; Pt denies any chest pain or sob. She is not tachypneic or hypoxic. Will get SLP evaluation to rule out aspiration On IV zosyn.     Elevated lipase:  ? Mild pancreatitis. Hydrate and repeat  lipase in am. She is not complaining  of any abdominal pain at this time.    Hyponatremia:  possibly from dehydration from diarrhea. Gently hydrate and repeat sodium in am.    Anemia of chronic disease.  Baseline hemoglobin around 8, currently at 10.7, .  Continue to monitor.    H/o right breast cancer s/p mastectomy.    H/o SBO, s/p colectomy with colostomy    H/o dementia without agitation and multiple sclerosis.  Watch for hospital delirium.    Severity of Illness: The appropriate patient status for this patient is INPATIENT. Inpatient status is judged to be reasonable and necessary in order to provide the required intensity of  service to ensure the patient's safety. The patient's presenting symptoms, physical exam findings, and initial radiographic and laboratory data in the context of their chronic comorbidities is felt to place them at high risk for further clinical deterioration. Furthermore, it is not anticipated that the patient will be medically stable for discharge from the hospital within 2 midnights of admission. The following factors support the patient status of inpatient.   " The patient's presenting symptoms include diarrhea, . " The initial radiographic and laboratory data are worrisome because of left sided colitis and enteritis. " The chronic co-morbidities include colectomy, MS, dementia. H/o breast cancer.    * I certify that at the point of admission it is my clinical judgment that the patient will require inpatient hospital care spanning beyond 2 midnights from the point of admission due to high intensity of service, high risk for further deterioration and high frequency of surveillance required.*    DVT prophylaxis: lovenox.  Code Status:  Full code.  Family Communication: discussed with caregiver and  Disposition Plan: pending IV antibiotics and clinical improvement.  Consults called: none.  Admission status; inpatient/ med surg.   Hosie Poisson MD [facility] Pager [phone]    If 7PM-7AM, please contact night-coverage [email or web address] Password [email or web address]  03/18/2019, 6:10 PM

## 2019-03-18 NOTE — ED Triage Notes (Signed)
EMS states pt has been febrile for several days, 101.5 after morning administration of Tylenol. Pt has a colostomy bag and has been experiencing diarrhea. Pt has dementia.  BP 90/66 HR 69 RR 18 SpO2 94 on RA CBG 132 Temp 101.5

## 2019-03-18 NOTE — ED Notes (Signed)
I attempted to do in and out on the patient and no urine came out.

## 2019-03-18 NOTE — ED Notes (Addendum)
Messaged attending to ask if I should collect the PCR stool or put in foley.

## 2019-03-18 NOTE — ED Notes (Signed)
2 failed attempts to collect Urine. Bladder scan was 323MG  but no urine return when placing In&Out cath. RN has been notified. Catheter was filled with white mucus (infection). May need larger size catheter?

## 2019-03-18 NOTE — ED Provider Notes (Signed)
[facility] DEPT Provider Note   CSN: [id number] Arrival date & time: 03/18/19  1042     History   Chief Complaint Chief Complaint  Patient presents with  . Fever  . Diarrhea    HPI Melissa Oconnell is a 63 y.o. female.     63yo female with history of dementia, multiple sclerosis, left sided weakness, brought in by EMS from home (lives at her caregivers house) with report of fever for several days, temp 101.5 after giving Tylenol and diarrhea in the colostomy bag today. MOST form reviewed with patient, IV antibiotics and fluids are OK if needed. Patient is a poor historian with inconsistent answers, states she woke up today and had to throw up. Also reports cough and shortness of breath.   Melissa Oconnell was evaluated in Emergency Department on 03/18/2019 for the symptoms described in the history of present illness. She was evaluated in the context of the global COVID-19 pandemic, which necessitated consideration that the patient might be at risk for infection with the SARS-CoV-2 virus that causes COVID-19. Institutional protocols and algorithms that pertain to the evaluation of patients at risk for COVID-19 are in a state of rapid change based on information released by regulatory bodies including the CDC and federal and state organizations. These policies and algorithms were followed during the patient's care in the ED.      Past Medical History:  Diagnosis Date  . Abnormality of gait   . Cancer ([place])    breast  . Memory loss   . Multiple sclerosis ([place])   . Neuromuscular disorder ([place])    multiple sclerosis  . Osteoporosis, unspecified 01/06/2014    Patient Active Problem List   Diagnosis Date Noted  . Unintentional weight loss 07/25/2018  . Gait abnormality 03/20/2017  . 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 Peritonitis ([place])   . Acute on chronic respiratory failure with hypoxemia ([place])   . Left arm swelling   . Status post PICC  central line placement   . Acute respiratory failure with hypoxia ([place])   . Cardiac arrest, cause unspecified ([place])   . Shock ([place]) 12/09/2016  . Left-sided weakness 12/09/2016  . Diarrhea 12/09/2016  . Preventive measure 04/05/2016  . Goals of care, counseling/discussion 04/05/2016  . Orthostatic hypotension 12/29/2014  . UTI (urinary tract infection) 12/29/2014  . Incisional pain 02/03/2014  . Osteoporosis 01/06/2014  . History of cancer of right breast 12/02/2013  . Multiple sclerosis ([place]) 12/10/2012  . Memory loss 12/03/2012  . Accidental Fall 12/03/2012  . Gait Disturbance  12/03/2012    Past Surgical History:  Procedure Laterality Date  . DIAGNOSTIC LAPAROSCOPY     "swallowed something"  . LAPAROTOMY N/A 12/10/2016   Procedure: EXPLORATORY LAPAROTOMY WITH SIGMOID COLECTOMY ANT HARTMAN'S POUCH;  Surgeon: Erroll Luna, MD;  Location: [facility];  Service: General;  Laterality: N/A;  . MASTECTOMY Right   . MASTECTOMY W/ SENTINEL NODE BIOPSY Right 12/02/2013   Procedure: TOTAL MASTECTOMY WITH SENTINEL LYMPH NODE BIOPSY;  Surgeon: Adin Hector, MD;  Location: [facility];  Service: General;  Laterality: Right;  . TONSILLECTOMY    . TRACHEOSTOMY  12/2016   Archie Endo 01/26/2017     OB History   No obstetric history on file.      Home Medications    Prior to Admission medications   Medication Sig Start Date End Date Taking? Authorizing Provider  apixaban (ELIQUIS) 5 MG TABS tablet Take 1 tablet (5 mg total) by  mouth 2 (two) times daily. Patient not taking: Reported on 07/25/2018 12/31/16   Minor, Grace Bushy, NP  aspirin 81 MG chewable tablet Chew 81 mg by mouth daily.    [provider]  famotidine (PEPCID) 20 MG tablet Take 20 mg by mouth 2 (two) times daily.    [provider]  furosemide (LASIX) 40 MG tablet Take 40 mg by mouth daily.    [provider]  guaiFENesin 200 MG tablet Take 200 mg by mouth 3 (three) times daily.    [provider]   Multiple Vitamin (MULTIVITAMIN WITH MINERALS) TABS tablet Take 1 tablet by mouth daily.    [provider]  Probiotic Product (PROBIOTIC PO) Take by mouth daily.    [provider]  saccharomyces boulardii (FLORASTOR) 250 MG capsule Take 250 mg by mouth 2 (two) times daily.    [provider]  UNABLE TO FIND Take by mouth 2 (two) times daily. Med Name: MedPass    [provider]  zinc sulfate 220 (50 Zn) MG capsule Take 220 mg by mouth daily.    [provider]    Family History Family History  Problem Relation Age of Onset  . Cancer Mother        breast    Social History Social History   Tobacco Use  . Smoking status: Former Smoker    Packs/day: 1.00    Years: 5.00    Pack years: 5.00    Types: Cigarettes  . Smokeless tobacco: Never Used  . Tobacco comment: "quit smoking cigarettes in 1999"  Substance Use Topics  . Alcohol use: Yes  . Drug use: Yes    Types: Cocaine    Comment: 12/29/2014 "quit "2011"     Allergies   Patient has no known allergies.   Review of Systems Review of Systems  Unable to perform ROS: Dementia  Respiratory: Positive for cough and shortness of breath.   Gastrointestinal: Positive for diarrhea, nausea and vomiting. Negative for abdominal pain.  Musculoskeletal: Negative for arthralgias and myalgias.  Skin: Negative for rash and wound.  Psychiatric/Behavioral: Positive for confusion.     Physical Exam Updated Vital Signs BP 104/74   Pulse (!) 57   Temp 99.4 F (37.4 C) (Oral)   Resp (!) 24   Ht 6' (1.829 m)   Wt 61.2 kg   SpO2 100%   BMI 18.31 kg/m   Physical Exam Vitals signs and nursing note reviewed.  Constitutional:      General: She is not in acute distress.    Appearance: She is well-developed. She is not diaphoretic.     Comments: Chronically ill appearing  HENT:     Head: Normocephalic and atraumatic.  Neck:     Musculoskeletal: Neck supple.  Cardiovascular:     Rate and  Rhythm: Normal rate and regular rhythm.     Pulses: Normal pulses.     Heart sounds: Normal heart sounds.  Pulmonary:     Effort: Pulmonary effort is normal. Tachypnea present.     Breath sounds: Normal breath sounds.  Abdominal:     Palpations: Abdomen is soft.     Tenderness: There is generalized abdominal tenderness.     Comments: Left lower colostomy site, multiple well healed surgical incisions.   Musculoskeletal:        General: No tenderness.     Right lower leg: No edema.     Left lower leg: No edema.     Comments: Left sided  weakness consistent with history  Skin:    General: Skin is warm and dry.     Findings: No erythema or rash.  Neurological:     Mental Status: She is alert.     Comments: Oriented to person and place  Psychiatric:        Behavior: Behavior normal.      ED Treatments / Results  Labs (all labs ordered are listed, but only abnormal results are displayed) Labs Reviewed  CBC WITH DIFFERENTIAL/PLATELET - Abnormal; Notable for the following components:      Result Value   RBC 3.52 (*)    Hemoglobin 10.7 (*)    HCT 33.1 (*)    All other components within normal limits  COMPREHENSIVE METABOLIC PANEL - Abnormal; Notable for the following components:   Sodium 129 (*)    Chloride 93 (*)    All other components within normal limits  LIPASE, BLOOD - Abnormal; Notable for the following components:   Lipase 54 (*)    All other components within normal limits  SARS CORONAVIRUS 2 (HOSPITAL ORDER, [place] [facility])  CULTURE, BLOOD (ROUTINE X 2)  CULTURE, BLOOD (ROUTINE X 2)  URINE CULTURE  LACTIC ACID, PLASMA  APTT  PROTIME-INR  LACTIC ACID, PLASMA  URINALYSIS, ROUTINE W REFLEX MICROSCOPIC    EKG None  Radiology Ct Abdomen Pelvis W Contrast  Result Date: 03/18/2019 CLINICAL DATA:  63 year old female with acute abdominal and pelvic pain, fever and diarrhea. History of sigmoid colectomy and Hartmann's pouch. EXAM: CT ABDOMEN  AND PELVIS WITH CONTRAST TECHNIQUE: Multidetector CT imaging of the abdomen and pelvis was performed using the standard protocol following bolus administration of intravenous contrast. CONTRAST:  176mL OMNIPAQUE IOHEXOL 300 MG/ML  SOLN COMPARISON:  01/03/2017 CT FINDINGS: Lower chest: A small pericardial effusion is again noted. A trace LEFT pleural effusion has decreased. LEFT basilar atelectasis/scarring noted. Hepatobiliary: The liver and gallbladder are unremarkable except for perfusion difference within the posterior RIGHT liver. No biliary dilatation. Pancreas: Unremarkable Spleen: Unremarkable Adrenals/Urinary Tract: The kidneys, adrenal glands and bladder are unremarkable. Stomach/Bowel: LEFT sided colostomy noted. There is mild circumferential wall thickening of the LEFT colon compatible with a colitis. Wall thickening of several small bowel loops within the LEFT abdomen are noted and suspicious for an enteritis. There is no evidence of bowel obstruction, abscess or pneumoperitoneum. Vascular/Lymphatic: No significant vascular findings are present. The visualized mesenteric vessels are patent. No enlarged abdominal or pelvic lymph nodes. Reproductive: Uterus and bilateral adnexa are unremarkable. Other: No ascites. Musculoskeletal: No acute or suspicious bony abnormalities. IMPRESSION: 1. LEFT colonic wall thickening compatible with colitis. 2. Wall thickening of LEFT abdominal small bowel loops likely representing an enteritis. 3. No evidence of bowel obstruction or pneumoperitoneum. 4. Stable small pericardial effusion, decreased trace LEFT pleural effusion and LEFT basilar atelectasis/scarring. Electronically Signed   By: Margarette Canada M.D.   On: 03/18/2019 14:58   Dg Chest Port 1 View  Result Date: 03/18/2019 CLINICAL DATA:  Fever for several days. EXAM: PORTABLE CHEST 1 VIEW COMPARISON:  PA and lateral chest 12/27/2016. FINDINGS: Left basilar airspace opacity is identified. Right lung clear. Heart  size normal. No pneumothorax or pleural effusion. IMPRESSION: Left basilar airspace opacity could be due to atelectasis or pneumonia. Electronically Signed   By: Inge Rise M.D.   On: 03/18/2019 13:15    Procedures Procedures (including critical care time)  Medications Ordered in ED Medications  sodium chloride (PF) 0.9 % injection (has no administration  in time range)  sodium chloride 0.9 % bolus 500 mL (0 mLs Intravenous Stopped 03/18/19 1345)  cefTRIAXone (ROCEPHIN) 2 g in sodium chloride 0.9 % 100 mL IVPB (0 g Intravenous Stopped 03/18/19 1345)  metroNIDAZOLE (FLAGYL) IVPB 500 mg (0 mg Intravenous Stopped 03/18/19 1546)  iohexol (OMNIPAQUE) 300 MG/ML solution 100 mL (100 mLs Intravenous Contrast Given 03/18/19 1423)     Initial Impression / Assessment and Plan / ED Course  I have reviewed the triage vital signs and the nursing notes.  Pertinent labs & imaging results that were available during my care of the patient were reviewed by me and considered in my medical decision making (see chart for details).  Clinical Course as of Mar 17 1614  Tue Mar 18, 2019  1600 Call to patient's caregiver Ila Mcgill, patient lives in her caregiver's home. Caregiver states patient felt warm, unsure of a fever (per EMS report patient had a fever of 101.5), patient has had diarrhea today and seemed more weak, otherwise appetite has been good. On exam, patient tachypneic, abdomen soft, mild generalized tenderness, left lower abdominal colostomy, colostomy bag is empty.   [LM]  1602 Sepsis protocol initiated due to tachypnea, hypotension with report of fever and unclear history, patient given Rocephin and Flagyl.  Review of lab work shows CBC without significant changes, CMP with hyponatremia sodium 129.  Lipase is 54, initial lactic acid of 0.9.  Oral temp of 99.4, requested rectal temp has not been completed yet. Case discussed with Dr. Francia Greaves, ER attending, recommends consult hospitalist for  admission for monitoring, fluids, antibiotics.    [LM]  [id number] Chest x-ray shows left basilar airspace opacity could be atelectasis or pneumonia.  CT abdomen pelvis with contrast shows left colonic wall thickening compatible with colitis, wall thickening of the left abdominal small bowel loops likely representing enteritis.  No evidence of bowel obstruction or pneumoperitoneum.   [LM]  1615 Case discussed with Dr. Karleen Hampshire with [facility] hospitalist service who will consult for admission.   [LM]    Clinical Course User Index [LM] Tacy Learn, PA-C      Final Clinical Impressions(s) / ED Diagnoses   Final diagnoses:  Colitis  Fever, unspecified fever cause    ED Discharge Orders    None       Tacy Learn, PA-C 03/18/19 1615    Valarie Merino, MD 03/21/19 [phone]

## 2019-03-19 ENCOUNTER — Other Ambulatory Visit: Payer: Self-pay

## 2019-03-19 DIAGNOSIS — E871 Hypo-osmolality and hyponatremia: Secondary | ICD-10-CM

## 2019-03-19 DIAGNOSIS — R197 Diarrhea, unspecified: Secondary | ICD-10-CM

## 2019-03-19 LAB — CBC
HCT: 33.1 % — ABNORMAL LOW (ref 36.0–46.0)
Hemoglobin: 10.8 g/dL — ABNORMAL LOW (ref 12.0–15.0)
MCH: 30.9 pg (ref 26.0–34.0)
MCHC: 32.6 g/dL (ref 30.0–36.0)
MCV: 94.6 fL (ref 80.0–100.0)
Platelets: 194 10*3/uL (ref 150–400)
RBC: 3.5 MIL/uL — ABNORMAL LOW (ref 3.87–5.11)
RDW: 13 % (ref 11.5–15.5)
WBC: 4.9 10*3/uL (ref 4.0–10.5)
nRBC: 0 % (ref 0.0–0.2)

## 2019-03-19 LAB — BASIC METABOLIC PANEL
Anion gap: 10 (ref 5–15)
BUN: 10 mg/dL (ref 8–23)
CO2: 20 mmol/L — ABNORMAL LOW (ref 22–32)
Calcium: 8.8 mg/dL — ABNORMAL LOW (ref 8.9–10.3)
Chloride: 105 mmol/L (ref 98–111)
Creatinine, Ser: 0.84 mg/dL (ref 0.44–1.00)
GFR calc Af Amer: >60 mL/min (ref >60)
GFR calc non Af Amer: >60 mL/min (ref >60)
Glucose, Bld: 87 mg/dL (ref 70–99)
Potassium: 3.8 mmol/L (ref 3.5–5.1)
Sodium: 135 mmol/L (ref 135–145)

## 2019-03-19 LAB — HIV ANTIBODY (ROUTINE TESTING W REFLEX): HIV Screen 4th Generation wRfx: NONREACTIVE

## 2019-03-19 LAB — LIPASE, BLOOD: Lipase: 46 U/L (ref 11–51)

## 2019-03-19 NOTE — Plan of Care (Signed)

## 2019-03-19 NOTE — Evaluation (Signed)
Physical Therapy Evaluation Patient Details Name: Melissa Oconnell MRN: [id number] DOB: [birth year] Today's Date: 03/19/2019   History of Present Illness  63 year old female was admitted with colitis, 3-4 day h/o diarrhea.  PMH:  MS, dementia, L sided weakness, breast ca.  Clinical Impression  On eval, pt required Min assist +2 for mobility. She walked ~5 feet with a RW. Gait is ataxic. Pt is at risk for falls when mobilizing. Pt presents with general weakness, decreased activity tolerance, and impaired gait and balance. Discussed d/c plan with caregiver [name]) this a.m-plan is for pt to return home. [name] stated pt has 24/7 care available. Will plan to follow during hospital stay.     Follow Up Recommendations Supervision/Assistance - 24 hour;Home health PT    Equipment Recommendations  None recommended by PT    Recommendations for Other Services       Precautions / Restrictions Precautions Precautions: Fall Restrictions Weight Bearing Restrictions: No      Mobility  Bed Mobility Overal bed mobility: Needs Assistance Bed Mobility: Supine to Sit     Supine to sit: Min assist Sit to supine: Min assist   General bed mobility comments: Assist for safety and to scoot to EOB. Increased time. Poor anterior weightshifting.  Transfers Overall transfer level: Needs assistance Equipment used: Rolling walker (2 wheeled) Transfers: Sit to/from Stand Sit to Stand: Min assist; +2 physical assistance; +2 safety/equipment        General transfer comment: x2. Assist to shift anteriorly, rise, stabilize, control descent. Posterior bias and poor anterior weighshifting for sit<>stand transitons.  Ambulation/Gait Ambulation/Gait assistance: Min assist;+2 physical assistance;+2 safety/equipment Gait Distance (Feet): 5 Feet Assistive device: Rolling walker (2 wheeled) Gait Pattern/deviations: Step-through pattern;Ataxic     General Gait Details: Assist to stabilize pt and manage RW.  Mild-Mod ataxia. Pt fatigues easily.  Stairs            Wheelchair Mobility    Modified Rankin (Stroke Patients Only)       Balance Overall balance assessment: Needs assistance         Standing balance support: Bilateral upper extremity supported Standing balance-Leahy Scale: Poor Standing balance comment: posterior bias                             Pertinent Vitals/Pain Pain Assessment: No/denies pain    Home Living Family/patient expects to be discharged to:: Private residence Living Arrangements: Other (Comment) Available Help at Discharge: Available 24 hours/day           Home Equipment: Gilford Rile - 2 wheels;Transport chair;Wheelchair - [employer];Hospital bed      Prior Function        ADL's / Homemaking Assistance Needed: helped in to shower on chair. Pt can wash front. Assistance for backside. Barbara sets pt up for brushing hair.        Hand Dominance   Dominant Hand: Right    Extremity/Trunk Assessment   Upper Extremity Assessment Upper Extremity Assessment: Defer to OT evaluation    Lower Extremity Assessment Lower Extremity Assessment: Generalized weakness    Cervical / Trunk Assessment Cervical / Trunk Assessment: Kyphotic  Communication   Communication: No difficulties  Cognition Arousal/Alertness: Awake/alert Behavior During Therapy: WFL for tasks assessed/performed Overall Cognitive Status: History of cognitive impairments - at baseline  General Comments: h/o dementia; follows commands, decreased safety      General Comments      Exercises     Assessment/Plan    PT Assessment Patient needs continued PT services  PT Problem List Decreased strength;Decreased mobility;Decreased balance;Decreased activity tolerance;Decreased cognition;Decreased knowledge of use of DME;Decreased safety awareness       PT Treatment Interventions DME  instruction;Therapeutic activities;Patient/family education;Therapeutic exercise;Gait training;Functional mobility training;Balance training    PT Goals (Current goals can be found in the Care Plan section)  Acute Rehab PT Goals Patient Stated Goal: per caregiver, for pt to be able to walk short distances PT Goal Formulation: Patient unable to participate in goal setting Time For Goal Achievement: 04/02/19 Potential to Achieve Goals: Fair    Frequency Min 3X/week   Barriers to discharge        Co-evaluation               AM-PAC PT "6 Clicks" Mobility  Outcome Measure Help needed turning from your back to your side while in a flat bed without using bedrails?: A Little Help needed moving from lying on your back to sitting on the side of a flat bed without using bedrails?: A Little Help needed moving to and from a bed to a chair (including a wheelchair)?: A Lot Help needed standing up from a chair using your arms (e.g., wheelchair or bedside chair)?: A Lot Help needed to walk in hospital room?: A Lot Help needed climbing 3-5 steps with a railing? : A Lot 6 Click Score: 14    End of Session Equipment Utilized During Treatment: Gait belt Activity Tolerance: Patient tolerated treatment well Patient left: in chair;with call bell/phone within reach;with chair alarm set   PT Visit Diagnosis: Muscle weakness (generalized) (M62.81);Difficulty in walking, not elsewhere classified (R26.2)    Time: [id number] PT Time Calculation (min) (ACUTE ONLY): 14 min   Charges:   PT Evaluation $PT Eval Moderate Complexity: 1 Mod           Weston Anna, PT Acute Rehabilitation Services Pager: [phone] Office: [phone] '

## 2019-03-19 NOTE — Progress Notes (Signed)
PROGRESS NOTE    Melissa Oconnell  [id number] DOB: [birth year] DOA: 03/18/2019 PCP: [facility]   Brief Narrative: Melissa Oconnell is a 63 y.o. female with medical history significant of MS, dementia, left sided weakness,  right side breast cancer s/p mastectomy in 2015. Patient presented secondary to loose bowel movements and admitted for colitis. Started on IV antibiotics.   Assessment & Plan:   Active Problems:   Diarrhea   Colitis   Hyponatremia   Anemia   Enteritis   Pancreatitis   Colitis Possibly infectious. GI pathogen panel obtained. Started on IV Zosyn. Left sided. Afebrile. Normal WBC. -Advance diet -Continue Zosyn for now  Left basilar airspace opacity Likely atelectasis. No evidence of pneumonia clinically  Elevated lipase Minimally elevated. Patient without symptoms of pancreatitis.   Hyponatremia Mild. Improved with IV fluids.  Anemia of chronic disease Hemoglobin is stable.  History of SBO Patient is s/p colectomy with colostomy  Dementia -Monitor. Attempted to call emergency contact with no answer  Pressure injury Right buttocks, POA  DVT prophylaxis: Lovenox Code Status:   Code Status: DNR Family Communication: None at bedside; attempted to call emergency contact with no answer Disposition Plan: Discharge home likely tomorrow if remains stable and once transitioned to oral antibiotics   Consultants:   None  Procedures:   None  Antimicrobials:  Zosyn    Subjective: No concerns today. No abdominal pain.  Objective: Vitals:   03/19/19 0500 03/19/19 0824 03/19/19 1055 03/19/19 1600  BP: 104/68 134/86 119/70 118/73  Pulse: 61 60 (!) 57 (!) 59  Resp: 16 14    Temp: 99.5 F (37.5 C) 99.2 F (37.3 C) 99.2 F (37.3 C) 98.8 F (37.1 C)  TempSrc: Oral Oral Oral Oral  SpO2: 94% 100% 100% 100%  Weight:      Height:        Intake/Output Summary (Last 24 hours) at 03/19/2019 1802 Last data filed at 03/19/2019  1323 Gross per 24 hour  Intake 1308.62 ml  Output 1250 ml  Net 58.62 ml   Filed Weights   03/18/19 1115  Weight: 61.2 kg    Examination:  General exam: Appears calm and comfortable Respiratory system: Clear to auscultation. Respiratory effort normal. Cardiovascular system: S1 & S2 heard, RRR. No murmurs, rubs, gallops or clicks. Gastrointestinal system: Abdomen is nondistended, soft and nontender. No organomegaly or masses felt. Normal bowel sounds heard. Colostomy bag is empty Central nervous system: Alert. No focal neurological deficits. Extremities: No edema. No calf tenderness Skin: No cyanosis. No rashes    Data Reviewed: I have personally reviewed following labs and imaging studies  CBC: Recent Labs  Lab 03/18/19 1230 03/19/19 0230  WBC 5.0 4.9  NEUTROABS 2.1  --   HGB 10.7* 10.8*  HCT 33.1* 33.1*  MCV 94.0 94.6  PLT 190 [id number]   Basic Metabolic Panel: Recent Labs  Lab 03/18/19 1230 03/19/19 0230  NA 129* 135  K 4.0 3.8  CL 93* 105  CO2 27 20*  GLUCOSE 97 87  BUN 13 10  CREATININE 0.91 0.84  CALCIUM 9.2 8.8*   GFR: Estimated Creatinine Clearance: 67.1 mL/min (by C-G formula based on SCr of 0.84 mg/dL). Liver Function Tests: Recent Labs  Lab 03/18/19 1230  AST 16  ALT 12  ALKPHOS 47  BILITOT 0.7  PROT 7.2  ALBUMIN 3.7   Recent Labs  Lab 03/18/19 1230 03/19/19 0230  LIPASE 54* 46   No results for input(s): AMMONIA in  the last 168 hours. Coagulation Profile: Recent Labs  Lab 03/18/19 1230  INR 1.1   Cardiac Enzymes: No results for input(s): CKTOTAL, CKMB, CKMBINDEX, TROPONINI in the last 168 hours. BNP (last 3 results) No results for input(s): PROBNP in the last 8760 hours. HbA1C: No results for input(s): HGBA1C in the last 72 hours. CBG: No results for input(s): GLUCAP in the last 168 hours. Lipid Profile: No results for input(s): CHOL, HDL, LDLCALC, TRIG, CHOLHDL, LDLDIRECT in the last 72 hours. Thyroid Function Tests: No  results for input(s): TSH, T4TOTAL, FREET4, T3FREE, THYROIDAB in the last 72 hours. Anemia Panel: Recent Labs    03/18/19 1230 03/18/19 2221  VITAMINB12  --  934*  FOLATE  --  23.6  FERRITIN  --  51  TIBC  --  310  IRON  --  27*  RETICCTPCT 0.9  --    Sepsis Labs: Recent Labs  Lab 03/18/19 1230 03/18/19 2221  LATICACIDVEN 0.9 0.9    Recent Results (from the past 240 hour(s))  SARS Coronavirus 2 [facility] order, Performed in [facility] hospital lab) Nasopharyngeal Nasopharyngeal Swab     Status: None   Collection Time: 03/18/19 12:01 PM   Specimen: Nasopharyngeal Swab  Result Value Ref Range Status   SARS Coronavirus 2 NEGATIVE NEGATIVE Final    Comment: (NOTE) If result is NEGATIVE SARS-CoV-2 target nucleic acids are NOT DETECTED. The SARS-CoV-2 RNA is generally detectable in upper and lower  respiratory specimens during the acute phase of infection. The lowest  concentration of SARS-CoV-2 viral copies this assay can detect is 250  copies / mL. A negative result does not preclude SARS-CoV-2 infection  and should not be used as the sole basis for treatment or other  patient management decisions.  A negative result may occur with  improper specimen collection / handling, submission of specimen other  than nasopharyngeal swab, presence of viral mutation(s) within the  areas targeted by this assay, and inadequate number of viral copies  (<250 copies / mL). A negative result must be combined with clinical  observations, patient history, and epidemiological information. If result is POSITIVE SARS-CoV-2 target nucleic acids are DETECTED. The SARS-CoV-2 RNA is generally detectable in upper and lower  respiratory specimens dur ing the acute phase of infection.  Positive  results are indicative of active infection with SARS-CoV-2.  Clinical  correlation with patient history and other diagnostic information is  necessary to determine patient infection status.  Positive results  do  not rule out bacterial infection or co-infection with other viruses. If result is PRESUMPTIVE POSTIVE SARS-CoV-2 nucleic acids MAY BE PRESENT.   A presumptive positive result was obtained on the submitted specimen  and confirmed on repeat testing.  While 2019 novel coronavirus  (SARS-CoV-2) nucleic acids may be present in the submitted sample  additional confirmatory testing may be necessary for epidemiological  and / or clinical management purposes  to differentiate between  SARS-CoV-2 and other Sarbecovirus currently known to infect humans.  If clinically indicated additional testing with an alternate test  methodology [phone]) is advised. The SARS-CoV-2 RNA is generally  detectable in upper and lower respiratory sp ecimens during the acute  phase of infection. The expected result is Negative. Fact Sheet for Patients:  StrictlyIdeas.no Fact Sheet for Healthcare Providers: BankingDealers.co.za This test is not yet approved or cleared by the [place] FDA and has been authorized for detection and/or diagnosis of SARS-CoV-2 by FDA under an Emergency Use Authorization (EUA).  This EUA will  remain in effect (meaning this test can be used) for the duration of the COVID-19 declaration under Section 564(b)(1) of the Act, 21 U.S.C. section 360bbb-3(b)(1), unless the authorization is terminated or revoked sooner. Performed at [facility], [place] [address] [place], [place] [address]   Blood Culture (routine x 2)     Status: None (Preliminary result)   Collection Time: 03/18/19 12:30 PM   Specimen: BLOOD  Result Value Ref Range Status   Specimen Description   Final    BLOOD LEFT ANTECUBITAL Performed at [facility] [address] [place], [place] [address]    Special Requests   Final    BOTTLES DRAWN AEROBIC AND ANAEROBIC Blood Culture adequate volume Performed at [facility] [address], [place], [place] [address]    Culture   Final    NO GROWTH < 24 HOURS Performed at [facility] [address] [place], [place] [address]    Report Status PENDING  Incomplete  Blood Culture (routine x 2)     Status: None (Preliminary result)   Collection Time: 03/18/19 12:45 PM   Specimen: BLOOD  Result Value Ref Range Status   Specimen Description   Final    BLOOD RIGHT ANTECUBITAL Performed at [facility] [address] [place], [place] [address]    Special Requests   Final    BOTTLES DRAWN AEROBIC AND ANAEROBIC Blood Culture adequate volume Performed at [facility] [address] [place], [place] [address]    Culture   Final    NO GROWTH < 24 HOURS Performed at [facility] [address] [place], [place] [address]    Report Status PENDING  Incomplete         Radiology Studies: Ct Abdomen Pelvis W Contrast  Result Date: 03/18/2019 CLINICAL DATA:  63 year old female with acute abdominal and pelvic pain, fever and diarrhea. History of sigmoid colectomy and Hartmann's pouch. EXAM: CT ABDOMEN AND PELVIS WITH CONTRAST TECHNIQUE: Multidetector CT imaging of the abdomen and pelvis was performed using the standard protocol following bolus administration of intravenous contrast. CONTRAST:  155mL OMNIPAQUE IOHEXOL 300 MG/ML  SOLN COMPARISON:  01/03/2017 CT FINDINGS: Lower chest: A small pericardial effusion is again noted. A trace LEFT pleural effusion has decreased. LEFT basilar atelectasis/scarring noted. Hepatobiliary: The liver and gallbladder are unremarkable except for perfusion difference within the posterior RIGHT liver. No biliary dilatation. Pancreas: Unremarkable Spleen: Unremarkable Adrenals/Urinary Tract: The kidneys, adrenal glands and bladder are unremarkable. Stomach/Bowel: LEFT sided colostomy noted. There is mild circumferential wall thickening of the LEFT colon compatible with a colitis.  Wall thickening of several small bowel loops within the LEFT abdomen are noted and suspicious for an enteritis. There is no evidence of bowel obstruction, abscess or pneumoperitoneum. Vascular/Lymphatic: No significant vascular findings are present. The visualized mesenteric vessels are patent. No enlarged abdominal or pelvic lymph nodes. Reproductive: Uterus and bilateral adnexa are unremarkable. Other: No ascites. Musculoskeletal: No acute or suspicious bony abnormalities. IMPRESSION: 1. LEFT colonic wall thickening compatible with colitis. 2. Wall thickening of LEFT abdominal small bowel loops likely representing an enteritis. 3. No evidence of bowel obstruction or pneumoperitoneum. 4. Stable small pericardial effusion, decreased trace LEFT pleural effusion and LEFT basilar atelectasis/scarring. Electronically Signed   By: Margarette Canada M.D.   On: 03/18/2019 14:58   Dg Chest Port 1 View  Result Date: 03/18/2019 CLINICAL DATA:  Fever for several days. EXAM: PORTABLE CHEST  1 VIEW COMPARISON:  PA and lateral chest 12/27/2016. FINDINGS: Left basilar airspace opacity is identified. Right lung clear. Heart size normal. No pneumothorax or pleural effusion. IMPRESSION: Left basilar airspace opacity could be due to atelectasis or pneumonia. Electronically Signed   By: Inge Rise M.D.   On: 03/18/2019 13:15        Scheduled Meds:  aspirin  81 mg Oral Daily   enoxaparin (LOVENOX) injection  40 mg Subcutaneous QHS   Continuous Infusions:  sodium chloride 75 mL/hr at 03/19/19 0016   piperacillin-tazobactam (ZOSYN)  IV 3.375 g (03/19/19 1531)     LOS: 1 day     Cordelia Poche, MD [facility] 03/19/2019, 6:02 PM  If 7PM-7AM, please contact night-coverage [email or web address]

## 2019-03-19 NOTE — Plan of Care (Signed)
Initiated POC 

## 2019-03-19 NOTE — Evaluation (Signed)
Occupational Therapy Evaluation Patient Details Name: Melissa Oconnell MRN: [id number] DOB: 1956/06/11 Today's Date: 03/19/2019    History of Present Illness This 63 year old female was admitted with colitis, 3-4 day h/o diarrhea.  PMH:  MS, dementia, L sided weakness   Clinical Impression   Pt was admitted for the above. At baseline, she has 24/7 caregivers and assist for LB adls/mobility.  Pt presents with posterior bias during standing/transfers. Will follow in acute setting with min A level goals for mobility to support ADLs to decrease burden of care at home and increase safety    Follow Up Recommendations  Home health OT;Supervision/Assistance - 24 hour    Equipment Recommendations  None recommended by OT    Recommendations for Other Services       Precautions / Restrictions Precautions Precautions: Fall Restrictions Weight Bearing Restrictions: No      Mobility Bed Mobility Overal bed mobility: Needs Assistance Bed Mobility: Supine to Sit;Sit to Supine       Sit to supine: Min assist   General bed mobility comments: for legs  Transfers Overall transfer level: Needs assistance Equipment used: Rolling walker (2 wheeled) Transfers: Sit to/from [facility] Sit to Stand: Min assist;Mod assist Stand pivot transfers: Mod assist       General transfer comment: Assist to stand and stabilize. Pt with posterior bias.  Did not follow cues well for turning walker to sit on bed.  Extensive assistance to weight shift forward to control descent back to bed    Balance Overall balance assessment: Needs assistance           Standing balance-Leahy Scale: Poor Standing balance comment: posterior bias                           ADL either performed or assessed with clinical judgement   ADL Overall ADL's : Needs assistance/impaired Eating/Feeding: Set up   Grooming: Set up   Upper Body Bathing: Set up;Supervision/ safety   Lower Body  Bathing: Total assistance   Upper Body Dressing : Minimal assistance   Lower Body Dressing: Total assistance   Toilet Transfer: Moderate assistance;Stand-pivot;RW(to bed from chair)             General ADL Comments: pt has assist for ADLs at baseline     Vision         Perception     Praxis      Pertinent Vitals/Pain Pain Assessment: No/denies pain     Hand Dominance Right   Extremity/Trunk Assessment Upper Extremity Assessment Upper Extremity Assessment: Generalized weakness(R > L, lifts L to 90)           Communication Communication Communication: No difficulties   Cognition Arousal/Alertness: Awake/alert Behavior During Therapy: WFL for tasks assessed/performed Overall Cognitive Status: No family/caregiver present to determine baseline cognitive functioning                                 General Comments: h/o dementia; follows commands, decreased safety   General Comments       Exercises     Shoulder Instructions      Home Living Family/patient expects to be discharged to:: Private residence Living Arrangements: Other (Comment) Available Help at Discharge: Available 24 hours/day Type of Home: House Home Access: Ramped entrance     Home Layout: One level  Bathroom Toilet: Standard     Home Equipment: Walker - 2 wheels;Transport chair;Wheelchair - [employer];Hospital bed          Prior Functioning/Environment Level of Independence: Needs assistance  Gait / Transfers Assistance Needed: used walker for for short distance ambulation up until ~1 week PTA ADL's / Homemaking Assistance Needed: helped in to shower on chair. Pt can wash front. Assistance for backside. Barbara sets pt up for brushing hair.            OT Problem List: Decreased strength;Decreased activity tolerance;Impaired balance (sitting and/or standing);Decreased cognition;Decreased safety awareness      OT Treatment/Interventions:  Self-care/ADL training;DME and/or AE instruction;Therapeutic activities;Patient/family education;Balance training;Cognitive remediation/compensation    OT Goals(Current goals can be found in the care plan section) Acute Rehab OT Goals OT Goal Formulation: Patient unable to participate in goal setting Time For Goal Achievement: 04/02/19 Potential to Achieve Goals: Good ADL Goals Pt Will Transfer to Toilet: with min assist;bedside commode;stand pivot transfer Additional ADL Goal #1: pt will go from sit to stand with min A and maintain with min guard for adls x 2 minutes Additional ADL Goal #2: Pt will perform bed mobility at min A level in preparation for adls  OT Frequency: Min 2X/week   Barriers to D/C:            Co-evaluation              AM-PAC OT "6 Clicks" Daily Activity     Outcome Measure Help from another person eating meals?: A Little Help from another person taking care of personal grooming?: A Little Help from another person toileting, which includes using toliet, bedpan, or urinal?: Total Help from another person bathing (including washing, rinsing, drying)?: A Lot Help from another person to put on and taking off regular upper body clothing?: A Little Help from another person to put on and taking off regular lower body clothing?: Total 6 Click Score: 13   End of Session    Activity Tolerance: Patient tolerated treatment well Patient left: in bed;with call bell/phone within reach;with bed alarm set  OT Visit Diagnosis: Unsteadiness on feet (R26.81);Muscle weakness (generalized) (M62.81)                Time: 1255-1310 OT Time Calculation (min): 15 min Charges:  OT General Charges $OT Visit: 1 Visit OT Evaluation $OT Eval Moderate Complexity: [place], OTR/L Acute Rehabilitation Services [phone] pager [phone] office 03/19/2019  [place] 03/19/2019, 2:08 PM

## 2019-03-20 NOTE — Discharge Summary (Signed)
Physician Discharge Summary  JUDA DEFREITAS [id number] DOB: Nov 15, 1955 DOA: 03/18/2019  PCP: [facility], [facility]  Admit date: 03/18/2019 Discharge date: 03/20/2019  Admitted From: Home Disposition: Home  Recommendations for Outpatient Follow-up:  1. Follow up with PCP in 1 week 2. Please obtain BMP/CBC in one week 3. Please follow up on the following pending results: Blood cultures  Home Health: PT, OT Equipment/Devices: None  Discharge Condition: Stable CODE STATUS: DNR Diet recommendation: regular diet   Brief/Interim Summary:  Admission HPI written by Hosie Poisson, MD   Chief Complaint: diarrhea for 3 to 4 days,  And increased sleepiness in 1 to 2 days.   HPI: Melissa BENETT is a 63 y.o. female with medical history significant of MS, dementia, left sided weakness,  right side breast cancer s/p mastectomy in 2015, was brought in by her care giver for loose bowel movements since 3 to 4 days. No fevers or chills. Pt is a poor historian and most of the history was obtained from the EDP notes and her caregiver over the phone. The caregiver reports diarrhea, liquid brown stool in the colostomy bag, no vomiting.  No fevers or chills.  No syncopal episodes. No melena or hematochezia.  And patient denies any other complaints. ED Course: On arrival to ED patient has low-grade temp of 99.4, bradycardic with heart rate of 52/min, slightly hypotensive with blood pressure of 88/62 improved to 123/67.  Lab work is significant for sodium of 129, chloride of 93, lipase of 54, hemoglobin of 10.7, hematocrit of 33.1.  COVID-19 screening test is negative, blood cultures ordered and are pending, CT abdomen and pelvis with contrast shows left-sided colitis and left-sided enteritis. No SBO.    Patient was referred to medical service for admission for hydration and management of left-sided colitis and enteritis with IV antibiotics.  She was given 500 mL of normal saline and a dose  of Rocephin and Flagyl given in ED.   Hospital course:  Colitis Possibly infectious. GI pathogen panel obtained. Started on IV Zosyn. Left sided. Afebrile. Normal WBC. No obvious source of infection. Discontinued antibiotics prior to discharge. Blood cultures with no growth to date.  Left basilar airspace opacity Likely atelectasis. No evidence of pneumonia clinically  Elevated lipase Minimally elevated. Patient without symptoms of pancreatitis.   Hyponatremia Mild. Improved with IV fluids.  Anemia of chronic disease Hemoglobin is stable.  History of SBO Patient is s/p colectomy with colostomy  Dementia Monitor.  Pressure injury Right buttocks, POA   Discharge Diagnoses:  Active Problems:   Diarrhea   Colitis   Hyponatremia   Anemia   Enteritis   Pancreatitis    Discharge Instructions  Discharge Instructions    Call MD for:  persistant nausea and vomiting   Complete by: As directed    Call MD for:  severe uncontrolled pain   Complete by: As directed    Call MD for:  temperature >100.4   Complete by: As directed    Increase activity slowly   Complete by: As directed      Allergies as of 03/20/2019   No Known Allergies     Medication List    STOP taking these medications   apixaban 5 MG Tabs tablet Commonly known as: ELIQUIS     TAKE these medications   aspirin 81 MG chewable tablet Chew 81 mg by mouth daily.   multivitamin with minerals Tabs tablet Take 1 tablet by mouth daily.   PROBIOTIC  PO Take by mouth daily.   saccharomyces boulardii 250 MG capsule Commonly known as: FLORASTOR Take 250 mg by mouth 2 (two) times daily.       No Known Allergies  Consultations:  None   Procedures/Studies: Ct Abdomen Pelvis W Contrast  Result Date: 03/18/2019 CLINICAL DATA:  63 year old female with acute abdominal and pelvic pain, fever and diarrhea. History of sigmoid colectomy and Hartmann's pouch. EXAM: CT ABDOMEN AND PELVIS WITH  CONTRAST TECHNIQUE: Multidetector CT imaging of the abdomen and pelvis was performed using the standard protocol following bolus administration of intravenous contrast. CONTRAST:  168mL OMNIPAQUE IOHEXOL 300 MG/ML  SOLN COMPARISON:  01/03/2017 CT FINDINGS: Lower chest: A small pericardial effusion is again noted. A trace LEFT pleural effusion has decreased. LEFT basilar atelectasis/scarring noted. Hepatobiliary: The liver and gallbladder are unremarkable except for perfusion difference within the posterior RIGHT liver. No biliary dilatation. Pancreas: Unremarkable Spleen: Unremarkable Adrenals/Urinary Tract: The kidneys, adrenal glands and bladder are unremarkable. Stomach/Bowel: LEFT sided colostomy noted. There is mild circumferential wall thickening of the LEFT colon compatible with a colitis. Wall thickening of several small bowel loops within the LEFT abdomen are noted and suspicious for an enteritis. There is no evidence of bowel obstruction, abscess or pneumoperitoneum. Vascular/Lymphatic: No significant vascular findings are present. The visualized mesenteric vessels are patent. No enlarged abdominal or pelvic lymph nodes. Reproductive: Uterus and bilateral adnexa are unremarkable. Other: No ascites. Musculoskeletal: No acute or suspicious bony abnormalities. IMPRESSION: 1. LEFT colonic wall thickening compatible with colitis. 2. Wall thickening of LEFT abdominal small bowel loops likely representing an enteritis. 3. No evidence of bowel obstruction or pneumoperitoneum. 4. Stable small pericardial effusion, decreased trace LEFT pleural effusion and LEFT basilar atelectasis/scarring. Electronically Signed   By: Margarette Canada M.D.   On: 03/18/2019 14:58   Dg Chest Port 1 View  Result Date: 03/18/2019 CLINICAL DATA:  Fever for several days. EXAM: PORTABLE CHEST 1 VIEW COMPARISON:  PA and lateral chest 12/27/2016. FINDINGS: Left basilar airspace opacity is identified. Right lung clear. Heart size normal. No  pneumothorax or pleural effusion. IMPRESSION: Left basilar airspace opacity could be due to atelectasis or pneumonia. Electronically Signed   By: Inge Rise M.D.   On: 03/18/2019 13:15      Subjective: No abdominal pain, diarrhea.  Discharge Exam: Vitals:   03/20/19 1146 03/20/19 1406  BP: 130/87 128/85  Pulse: (!) 51 (!) 54  Resp: 16 16  Temp: (!) 97.5 F (36.4 C) 98 F (36.7 C)  SpO2: 100% 99%   Vitals:   03/19/19 2054 03/20/19 0534 03/20/19 1146 03/20/19 1406  BP: 108/70 (!) 142/129 130/87 128/85  Pulse: (!) 57 69 (!) 51 (!) 54  Resp: 16 16 16 16   Temp: 98.6 F (37 C) 98 F (36.7 C) (!) 97.5 F (36.4 C) 98 F (36.7 C)  TempSrc:      SpO2: 100% 99% 100% 99%  Weight:      Height:        General: Pt is alert, awake, not in acute distress Cardiovascular: RRR, S1/S2 +, no rubs, no gallops Respiratory: CTA bilaterally, no wheezing, no rhonchi Abdominal: Soft, NT, ND, bowel sounds + Extremities: no edema, no cyanosis    The results of significant diagnostics from this hospitalization (including imaging, microbiology, ancillary and laboratory) are listed below for reference.     Microbiology: Recent Results (from the past 240 hour(s))  SARS Coronavirus 2 [facility] order, Performed in [facility] hospital lab) Nasopharyngeal Nasopharyngeal Swab  Status: None   Collection Time: 03/18/19 12:01 PM   Specimen: Nasopharyngeal Swab  Result Value Ref Range Status   SARS Coronavirus 2 NEGATIVE NEGATIVE Final    Comment: (NOTE) If result is NEGATIVE SARS-CoV-2 target nucleic acids are NOT DETECTED. The SARS-CoV-2 RNA is generally detectable in upper and lower  respiratory specimens during the acute phase of infection. The lowest  concentration of SARS-CoV-2 viral copies this assay can detect is 250  copies / mL. A negative result does not preclude SARS-CoV-2 infection  and should not be used as the sole basis for treatment or other  patient management decisions.   A negative result may occur with  improper specimen collection / handling, submission of specimen other  than nasopharyngeal swab, presence of viral mutation(s) within the  areas targeted by this assay, and inadequate number of viral copies  (<250 copies / mL). A negative result must be combined with clinical  observations, patient history, and epidemiological information. If result is POSITIVE SARS-CoV-2 target nucleic acids are DETECTED. The SARS-CoV-2 RNA is generally detectable in upper and lower  respiratory specimens dur ing the acute phase of infection.  Positive  results are indicative of active infection with SARS-CoV-2.  Clinical  correlation with patient history and other diagnostic information is  necessary to determine patient infection status.  Positive results do  not rule out bacterial infection or co-infection with other viruses. If result is PRESUMPTIVE POSTIVE SARS-CoV-2 nucleic acids MAY BE PRESENT.   A presumptive positive result was obtained on the submitted specimen  and confirmed on repeat testing.  While 2019 novel coronavirus  (SARS-CoV-2) nucleic acids may be present in the submitted sample  additional confirmatory testing may be necessary for epidemiological  and / or clinical management purposes  to differentiate between  SARS-CoV-2 and other Sarbecovirus currently known to infect humans.  If clinically indicated additional testing with an alternate test  methodology [phone]) is advised. The SARS-CoV-2 RNA is generally  detectable in upper and lower respiratory sp ecimens during the acute  phase of infection. The expected result is Negative. Fact Sheet for Patients:  StrictlyIdeas.no Fact Sheet for Healthcare Providers: BankingDealers.co.za This test is not yet approved or cleared by the [place] FDA and has been authorized for detection and/or diagnosis of SARS-CoV-2 by FDA under an Emergency Use  Authorization (EUA).  This EUA will remain in effect (meaning this test can be used) for the duration of the COVID-19 declaration under Section 564(b)(1) of the Act, 21 U.S.C. section 360bbb-3(b)(1), unless the authorization is terminated or revoked sooner. Performed at [facility], [place] [address] [place], [place] [address]   Blood Culture (routine x 2)     Status: None (Preliminary result)   Collection Time: 03/18/19 12:30 PM   Specimen: BLOOD  Result Value Ref Range Status   Specimen Description   Final    BLOOD LEFT ANTECUBITAL Performed at [facility] [address], [place], [place] [address]    Special Requests   Final    BOTTLES DRAWN AEROBIC AND ANAEROBIC Blood Culture adequate volume Performed at [facility] [address] [place], [place] [address]    Culture   Final    NO GROWTH 2 DAYS Performed at [facility] [address], [place], [place] [address]    Report Status PENDING  Incomplete  Blood Culture (routine x 2)     Status: None (Preliminary result)   Collection Time: 03/18/19 12:45 PM  Specimen: BLOOD  Result Value Ref Range Status   Specimen Description   Final    BLOOD RIGHT ANTECUBITAL Performed at [facility] [address] [place], [place] [address]    Special Requests   Final    BOTTLES DRAWN AEROBIC AND ANAEROBIC Blood Culture adequate volume Performed at [facility] [address], [place], [place] [address]    Culture   Final    NO GROWTH 2 DAYS Performed at [facility] [address] [place], [place] [address]    Report Status PENDING  Incomplete     Labs: BNP (last 3 results) No results for input(s): BNP in the last 8760 hours. Basic Metabolic Panel: Recent Labs  Lab 03/18/19 1230 03/19/19 0230  NA 129* 135  K 4.0 3.8  CL 93* 105  CO2 27 20*  GLUCOSE 97 87  BUN 13 10  CREATININE 0.91 0.84  CALCIUM 9.2 8.8*    Liver Function Tests: Recent Labs  Lab 03/18/19 1230  AST 16  ALT 12  ALKPHOS 47  BILITOT 0.7  PROT 7.2  ALBUMIN 3.7   Recent Labs  Lab 03/18/19 1230 03/19/19 0230  LIPASE 54* 46   No results for input(s): AMMONIA in the last 168 hours. CBC: Recent Labs  Lab 03/18/19 1230 03/19/19 0230  WBC 5.0 4.9  NEUTROABS 2.1  --   HGB 10.7* 10.8*  HCT 33.1* 33.1*  MCV 94.0 94.6  PLT 190 194   Cardiac Enzymes: No results for input(s): CKTOTAL, CKMB, CKMBINDEX, TROPONINI in the last 168 hours. BNP: Invalid input(s): POCBNP CBG: No results for input(s): GLUCAP in the last 168 hours. D-Dimer No results for input(s): DDIMER in the last 72 hours. Hgb A1c No results for input(s): HGBA1C in the last 72 hours. Lipid Profile No results for input(s): CHOL, HDL, LDLCALC, TRIG, CHOLHDL, LDLDIRECT in the last 72 hours. Thyroid function studies No results for input(s): TSH, T4TOTAL, T3FREE, THYROIDAB in the last 72 hours.  Invalid input(s): FREET3 Anemia work up Recent Labs    03/18/19 1230 03/18/19 2221  VITAMINB12  --  934*  FOLATE  --  23.6  FERRITIN  --  51  TIBC  --  310  IRON  --  27*  RETICCTPCT 0.9  --    Urinalysis    Component Value Date/Time   COLORURINE YELLOW 01/03/2017 [place] 01/03/2017 1205   LABSPEC 1.008 01/03/2017 1205   PHURINE 6.0 01/03/2017 1205   GLUCOSEU NEGATIVE 01/03/2017 1205   [place] 01/03/2017 1205   [place] 01/03/2017 1205   [place] 01/03/2017 1205   PROTEINUR NEGATIVE 01/03/2017 1205   UROBILINOGEN 0.2 12/29/2014 1401   NITRITE NEGATIVE 01/03/2017 1205   LEUKOCYTESUR TRACE (A) 01/03/2017 1205   Sepsis Labs Invalid input(s): PROCALCITONIN,  WBC,  LACTICIDVEN Microbiology Recent Results (from the past 240 hour(s))  SARS Coronavirus 2 [facility] order, Performed in [place] hospital lab) Nasopharyngeal Nasopharyngeal Swab     Status: None   Collection Time: 03/18/19 12:01 PM    Specimen: Nasopharyngeal Swab  Result Value Ref Range Status   SARS Coronavirus 2 NEGATIVE NEGATIVE Final    Comment: (NOTE) If result is NEGATIVE SARS-CoV-2 target nucleic acids are NOT DETECTED. The SARS-CoV-2 RNA is generally detectable in upper and lower  respiratory specimens during the acute phase of infection. The lowest  concentration of SARS-CoV-2 viral copies this assay can detect is 250  copies / mL. A negative result  does not preclude SARS-CoV-2 infection  and should not be used as the sole basis for treatment or other  patient management decisions.  A negative result may occur with  improper specimen collection / handling, submission of specimen other  than nasopharyngeal swab, presence of viral mutation(s) within the  areas targeted by this assay, and inadequate number of viral copies  (<250 copies / mL). A negative result must be combined with clinical  observations, patient history, and epidemiological information. If result is POSITIVE SARS-CoV-2 target nucleic acids are DETECTED. The SARS-CoV-2 RNA is generally detectable in upper and lower  respiratory specimens dur ing the acute phase of infection.  Positive  results are indicative of active infection with SARS-CoV-2.  Clinical  correlation with patient history and other diagnostic information is  necessary to determine patient infection status.  Positive results do  not rule out bacterial infection or co-infection with other viruses. If result is PRESUMPTIVE POSTIVE SARS-CoV-2 nucleic acids MAY BE PRESENT.   A presumptive positive result was obtained on the submitted specimen  and confirmed on repeat testing.  While 2019 novel coronavirus  (SARS-CoV-2) nucleic acids may be present in the submitted sample  additional confirmatory testing may be necessary for epidemiological  and / or clinical management purposes  to differentiate between  SARS-CoV-2 and other Sarbecovirus currently known to infect humans.  If  clinically indicated additional testing with an alternate test  methodology [phone]) is advised. The SARS-CoV-2 RNA is generally  detectable in upper and lower respiratory sp ecimens during the acute  phase of infection. The expected result is Negative. Fact Sheet for Patients:  StrictlyIdeas.no Fact Sheet for Healthcare Providers: BankingDealers.co.za This test is not yet approved or cleared by the [place] FDA and has been authorized for detection and/or diagnosis of SARS-CoV-2 by FDA under an Emergency Use Authorization (EUA).  This EUA will remain in effect (meaning this test can be used) for the duration of the COVID-19 declaration under Section 564(b)(1) of the Act, 21 U.S.C. section 360bbb-3(b)(1), unless the authorization is terminated or revoked sooner. Performed at [facility], [place] [address] [place], [place] [address]   Blood Culture (routine x 2)     Status: None (Preliminary result)   Collection Time: 03/18/19 12:30 PM   Specimen: BLOOD  Result Value Ref Range Status   Specimen Description   Final    BLOOD LEFT ANTECUBITAL Performed at [facility] [address], [place], [place] [address]    Special Requests   Final    BOTTLES DRAWN AEROBIC AND ANAEROBIC Blood Culture adequate volume Performed at [facility] [address], [place], [place] [address]    Culture   Final    NO GROWTH 2 DAYS Performed at [facility] [address], [place], [place] [address]    Report Status PENDING  Incomplete  Blood Culture (routine x 2)     Status: None (Preliminary result)   Collection Time: 03/18/19 12:45 PM   Specimen: BLOOD  Result Value Ref Range Status   Specimen Description   Final    BLOOD RIGHT ANTECUBITAL Performed at [facility] [address], [place], [place] [address]    Special Requests   Final    BOTTLES DRAWN AEROBIC  AND ANAEROBIC Blood Culture adequate volume Performed at [facility] [address], [place], [place] [address]    Culture   Final    NO GROWTH 2 DAYS Performed  at [facility], [place] [address], [place], [place] [address]    Report Status PENDING  Incomplete     Time coordinating discharge: 35 minutes  SIGNED:   Cordelia Poche, MD [facility] 03/20/2019, 2:16 PM

## 2019-03-20 NOTE — TOC Transition Note (Signed)
Transition of Care [facility]) - CM/SW Discharge Note   Patient Details  Name: Melissa Oconnell MRN: [id number] Date of Birth: 1956-05-09  [facility] CM/SW Contact:  Leeroy Cha, RN Phone Number: 03/20/2019, 11:18 AM   Clinical Narrative:    Home pt and ot through [facility]   Final next level of care: [place] Barriers to Discharge: No Barriers Identified   Patient Goals and CMS Choice Patient states their goals for this hospitalization and ongoing recovery are:: to go home CMS Medicare.gov Compare Post Acute Care list provided to:: Patient Choice offered to / list presented to : Patient  Discharge Placement                       Discharge Plan and Services   Discharge Planning Services: CM Consult Post Acute Care Choice: Home Health                    HH Arranged: PT, OT [place] Agency: [facility] [employer]) Date [place]: 03/20/19 Time [place]: 1117 Representative spoke with at [place]: mike  Social Determinants of Health ([place]) Interventions     Readmission Risk Interventions No flowsheet data found.

## 2019-03-20 NOTE — Plan of Care (Signed)

## 2019-03-20 NOTE — Discharge Instructions (Signed)
Melissa Oconnell,  You were in the hospital because of diarrhea. This may or may not have been infectious. You received antibiotics but it does not appear that you have a significant infection. You will not be discharged on antibiotics, however, if symptoms return, you can discuss with your PCP to start a course of oral antibiotics.

## 2019-03-23 LAB — CULTURE, BLOOD (ROUTINE X 2)
Culture: NO GROWTH
Culture: NO GROWTH
Special Requests: ADEQUATE
Special Requests: ADEQUATE

## 2019-05-03 IMAGING — CT CT ANGIO CHEST
2 of 8 series · 18 of 46 positions shown · IV contrast (OMNI)
Comparison: None.

CLINICAL DATA: Cardiac arrest.

EXAM:
CT ANGIOGRAPHY CHEST WITH CONTRAST
TECHNIQUE: Multidetector CT imaging of the chest was performed using the
standard protocol during bolus administration of intravenous
contrast. Multiplanar CT image reconstructions and MIPs were
obtained to evaluate the vascular anatomy.

[Series 15: thins · axial · 0.69mm/px · z∈[+1275,+1553]mm · 15 of 306 slices shown]
[im 14/306  lung]
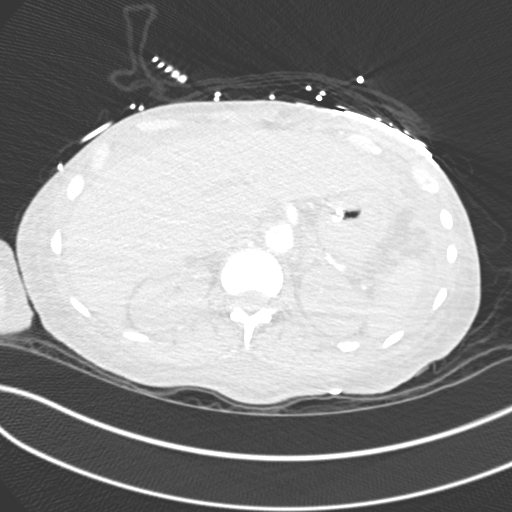
[im 42/306  soft-tissue]
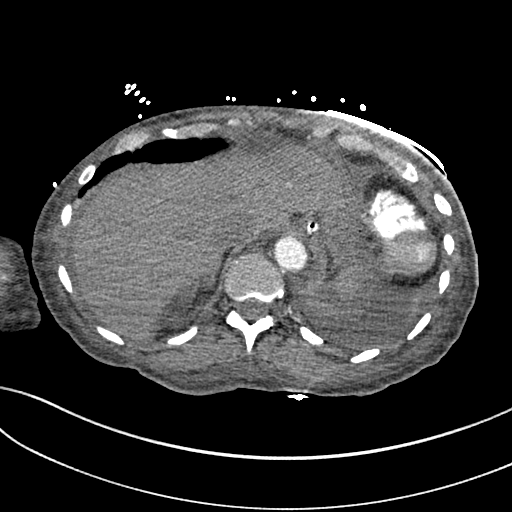
[im 56/306  lung]
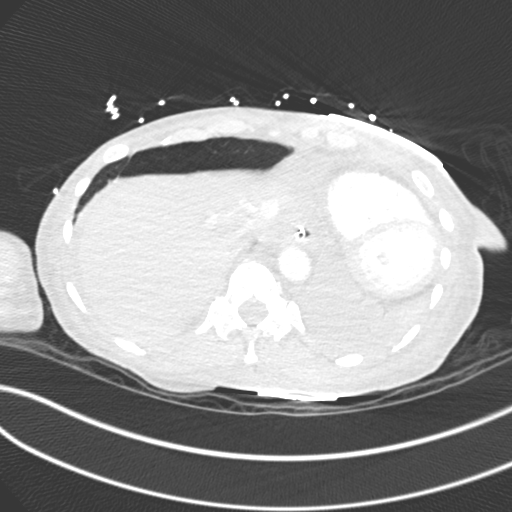
[im 70/306  soft-tissue]
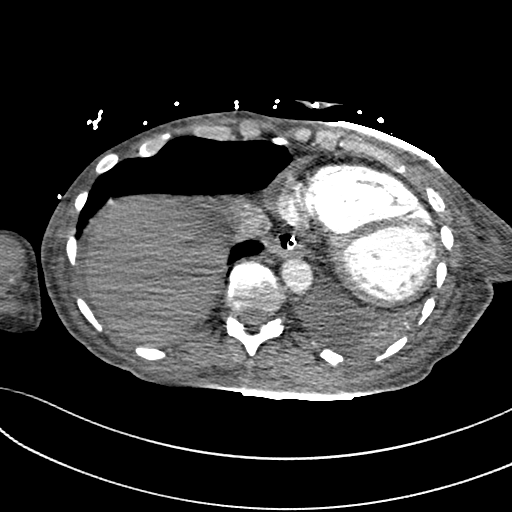
[im 98/306  lung]
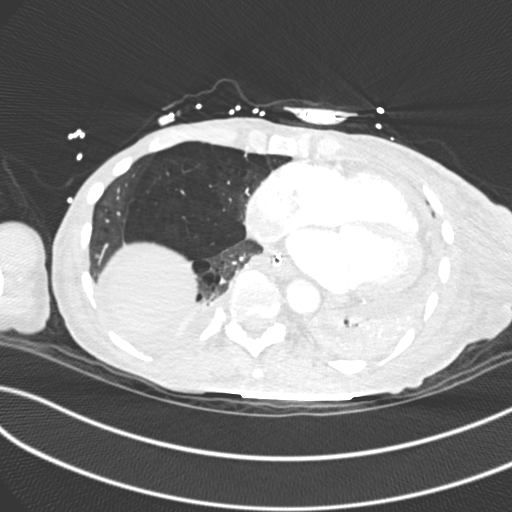
[im 111/306  soft-tissue]
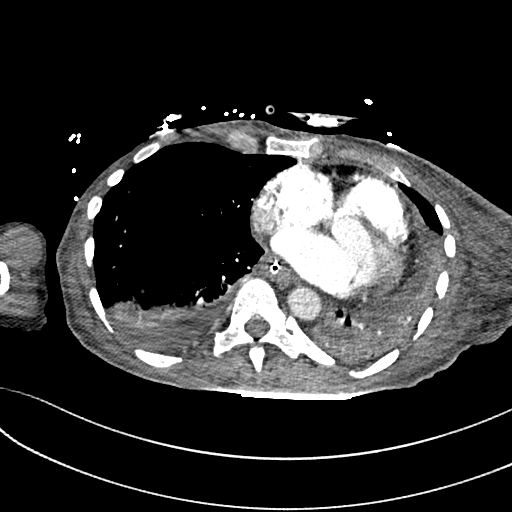
[im 139/306  lung]
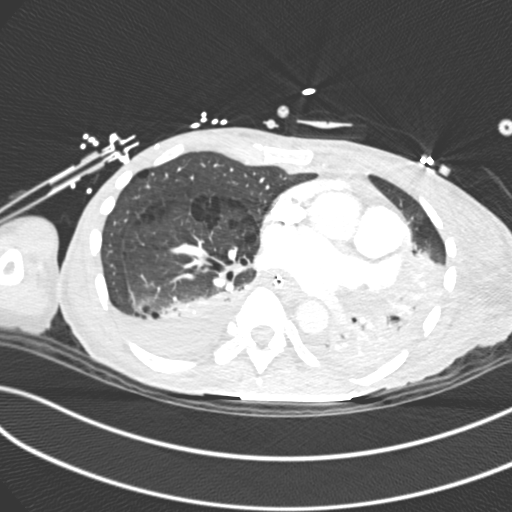
[im 153/306  soft-tissue]
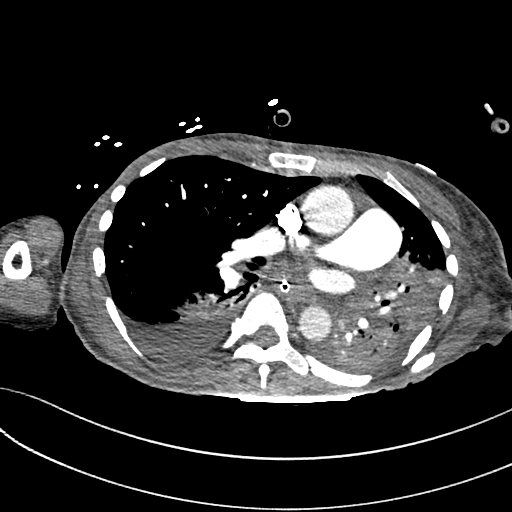
[im 167/306  lung]
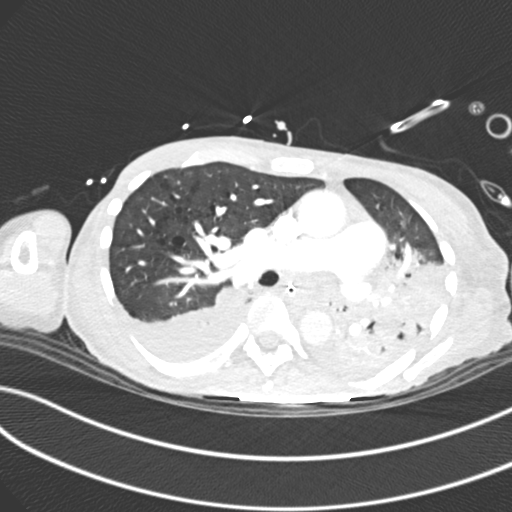
[im 195/306  soft-tissue]
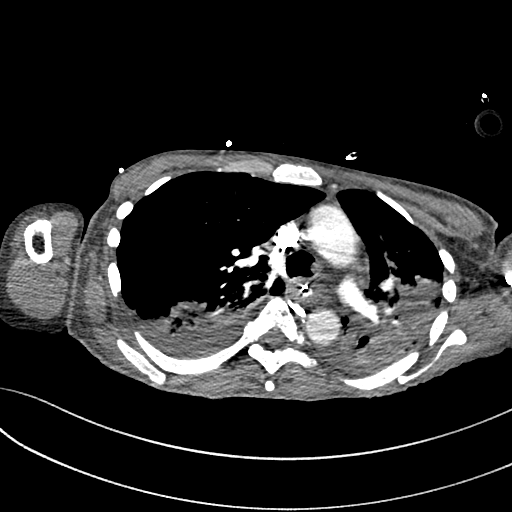
[im 208/306  lung]
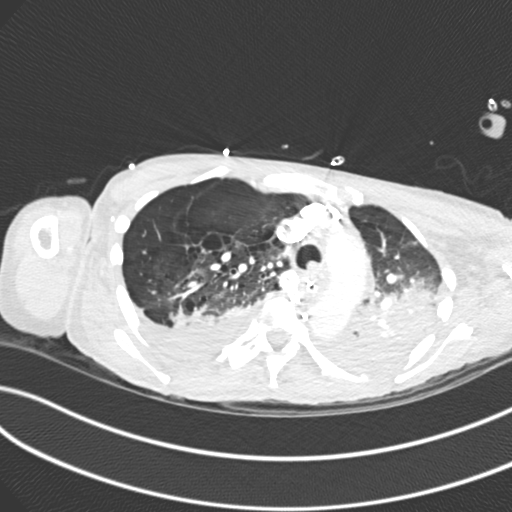
[im 236/306  soft-tissue]
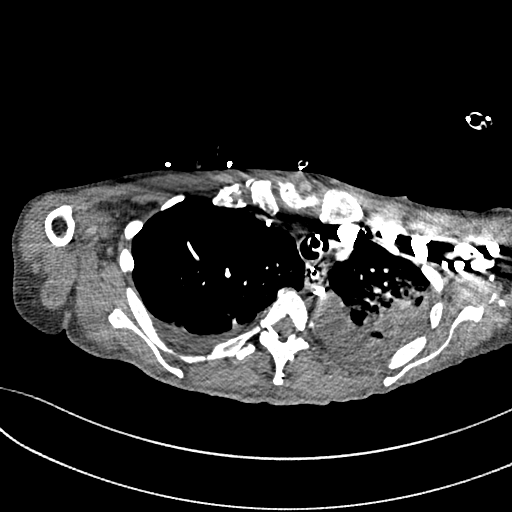
[im 250/306  lung]
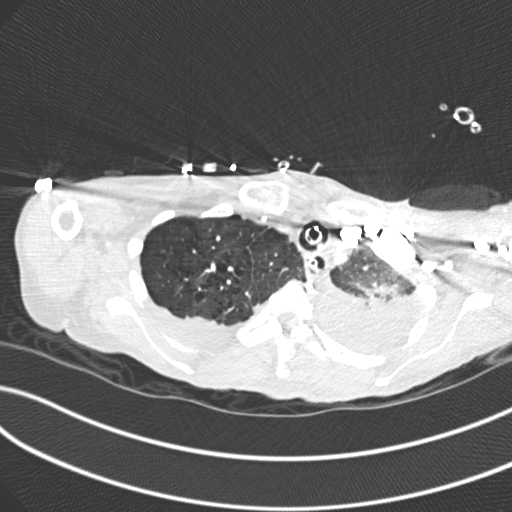
[im 264/306  soft-tissue]
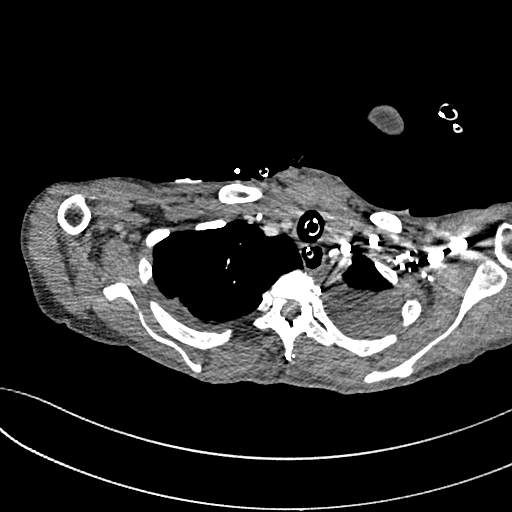
[im 292/306  lung]
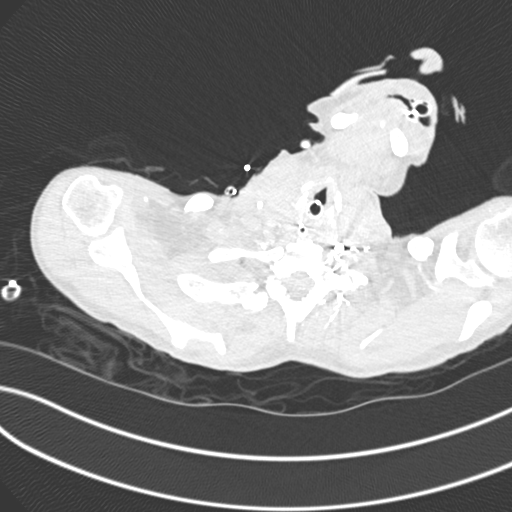

[Series 17: coronal mpr · coronal · 0.62mm/px · 3 of 148 slices shown]
[im 37/148  soft-tissue]
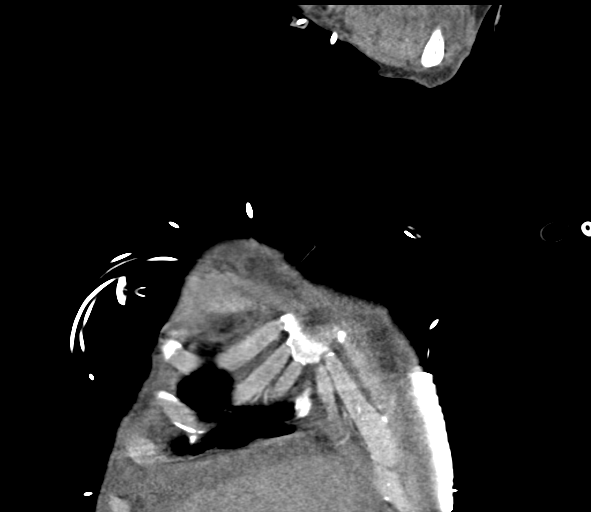
[im 74/148  soft-tissue]
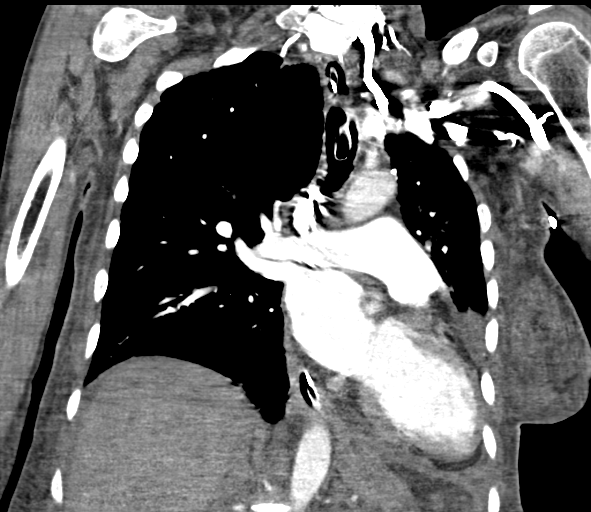
[im 111/148  soft-tissue]
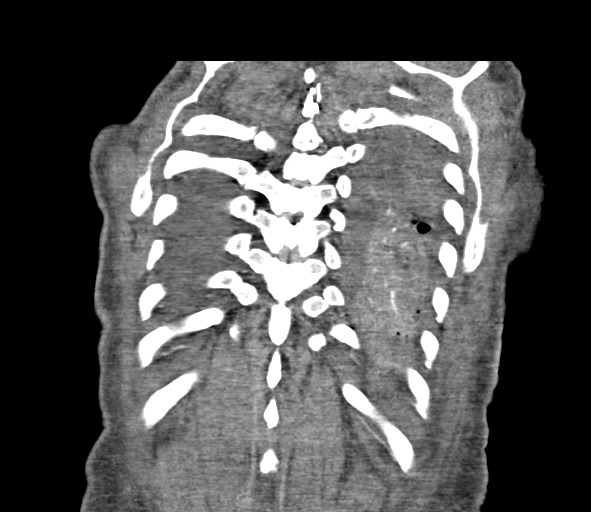

[18 of 46 positions shown; findings below may reference images not displayed]

FINDINGS: Cardiovascular: Heart size mildly increased. No substantial
pericardial effusion. No thoracic aortic aneurysm. No evidence for
dissection of the thoracic aorta. No large central pulmonary
embolus. No evidence for pulmonary embolus to the right lung (see
image 155 series 15). Right IJ central line tip positioned mid SVC.

Mediastinum/Nodes: Endotracheal and NG tubes are evident. No
mediastinal lymphadenopathy. All soft tissue attenuation in the left
hilum likely related to consolidative change in the central lung.
There is no axillary lymphadenopathy.

Lungs/Pleura: Left mainstem bronchus is occluded. Dense airspace
consolidation identified posterior left upper lobe and diffusely in
the left lower lobe. There is right lower lobe
collapse/consolidation. Centrilobular and paraseptal emphysema noted
bilaterally.

Upper Abdomen: Unremarkable.

Musculoskeletal: Diffuse body wall edema evident. Bone windows
reveal no worrisome lytic or sclerotic osseous lesions.

Review of the MIP images confirms the above findings.
IMPRESSION: 1. Trace nonobstructive pulmonary embolus to a segmental branch of
the left lower lobe.
2. Dense airspace consolidation posterior left upper lobe and
diffusely in the left lower lobe with occluded left mainstem
bronchus.
3. Small bilateral pleural effusions.
Critical Value/emergent results were called by me at the time of
interpretation on 12/16/2016 at [DATE] to MAFFO IVETTE , who
verbally acknowledged these results.

## 2019-05-04 IMAGING — DX DG CHEST 1V PORT
1 series · 1 of 1 positions shown · non-contrast
Comparison: 12/15/2016

CLINICAL DATA: Respiratory failure.  Ventilator dependence.

EXAM:
PORTABLE CHEST 1 VIEW

[chest]
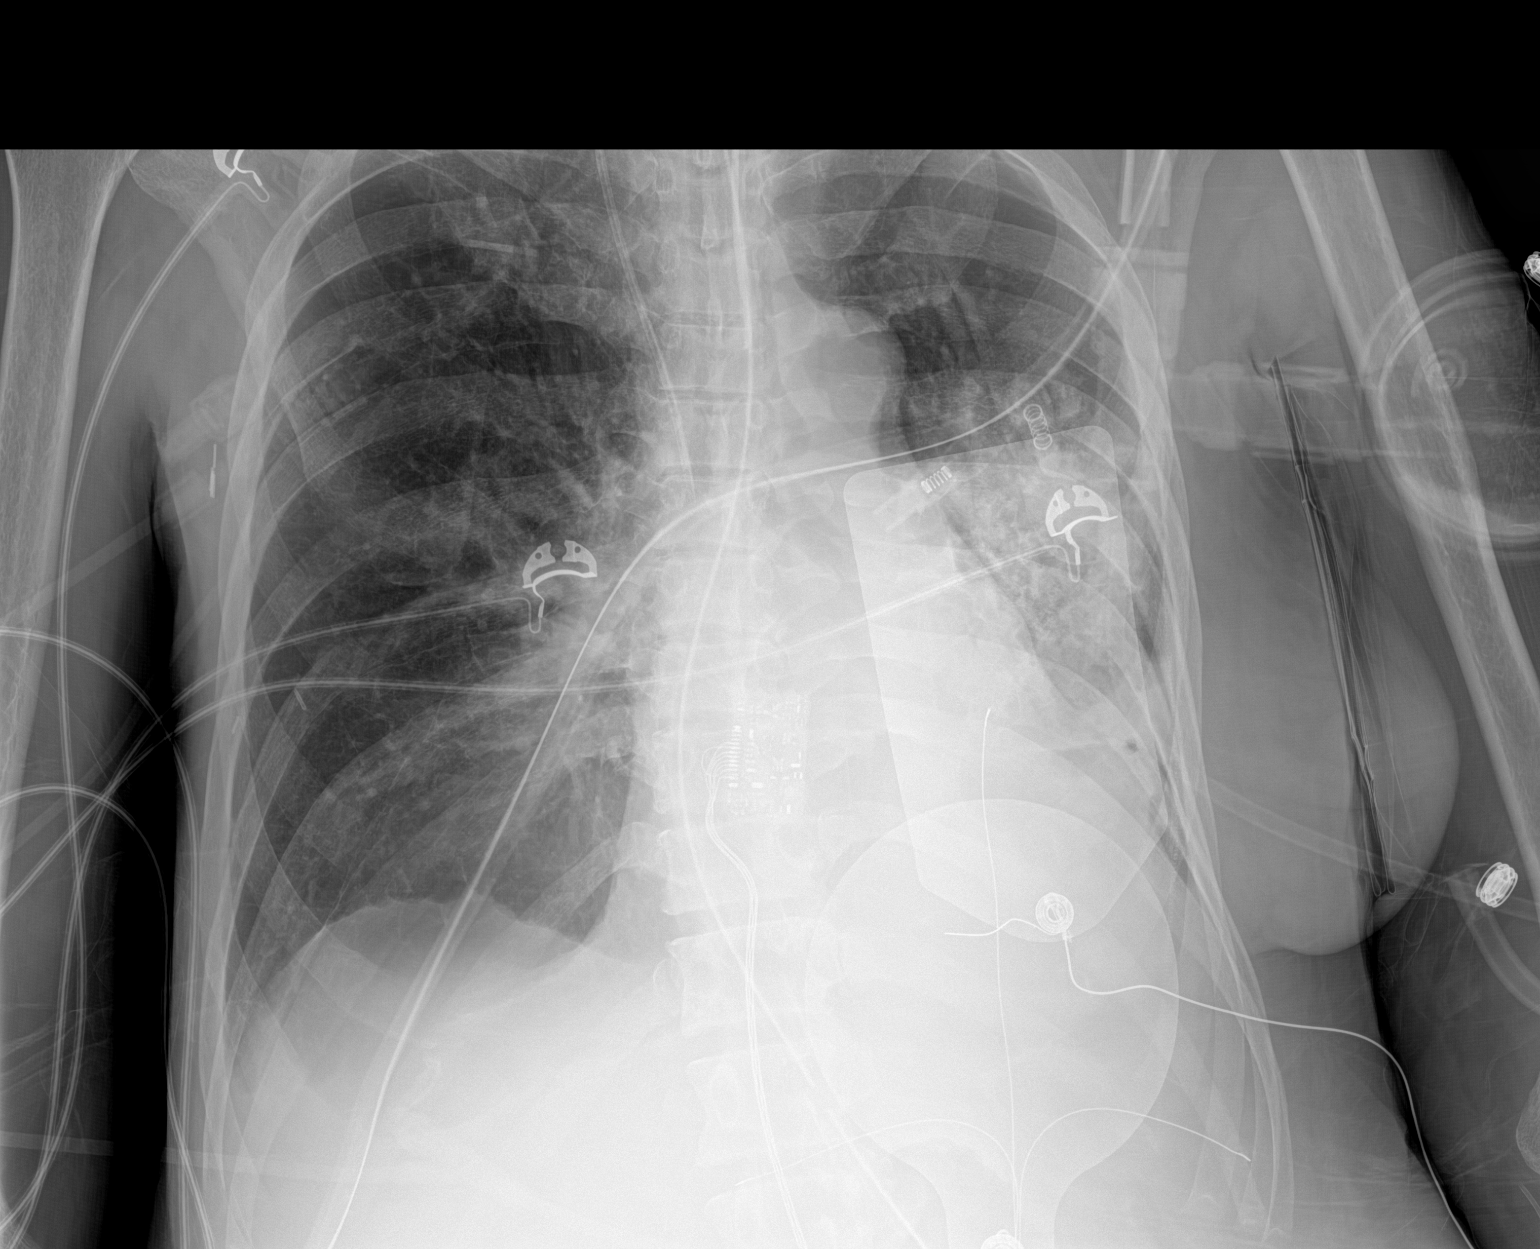

[1 of 1 positions shown; findings below may reference images not displayed]

FINDINGS: Endotracheal tube tip is approximately 3 cm above the base of the
carina. The NG tube passes into the stomach although the distal tip
position is not included on the film. Right IJ central line tip
overlies proximal SVC level. Cardiopericardial silhouette is at
upper limits of normal for size. Left base collapse/ consolidation
similar to prior. Interval slight improvement in aeration at the
right lung base. Probable tiny bilateral pleural effusions.
Telemetry leads overlie the chest. Deferred later pads overlie the
left chest.
IMPRESSION: Slight interval improvement in right base aeration. Otherwise
largely stable exam.

## 2019-05-06 IMAGING — CR DG CHEST 1V PORT
1 series · 1 of 1 positions shown · non-contrast
Comparison: December 17, 2016

CLINICAL DATA: Hypoxia

EXAM:
PORTABLE CHEST 1 VIEW

[AP]
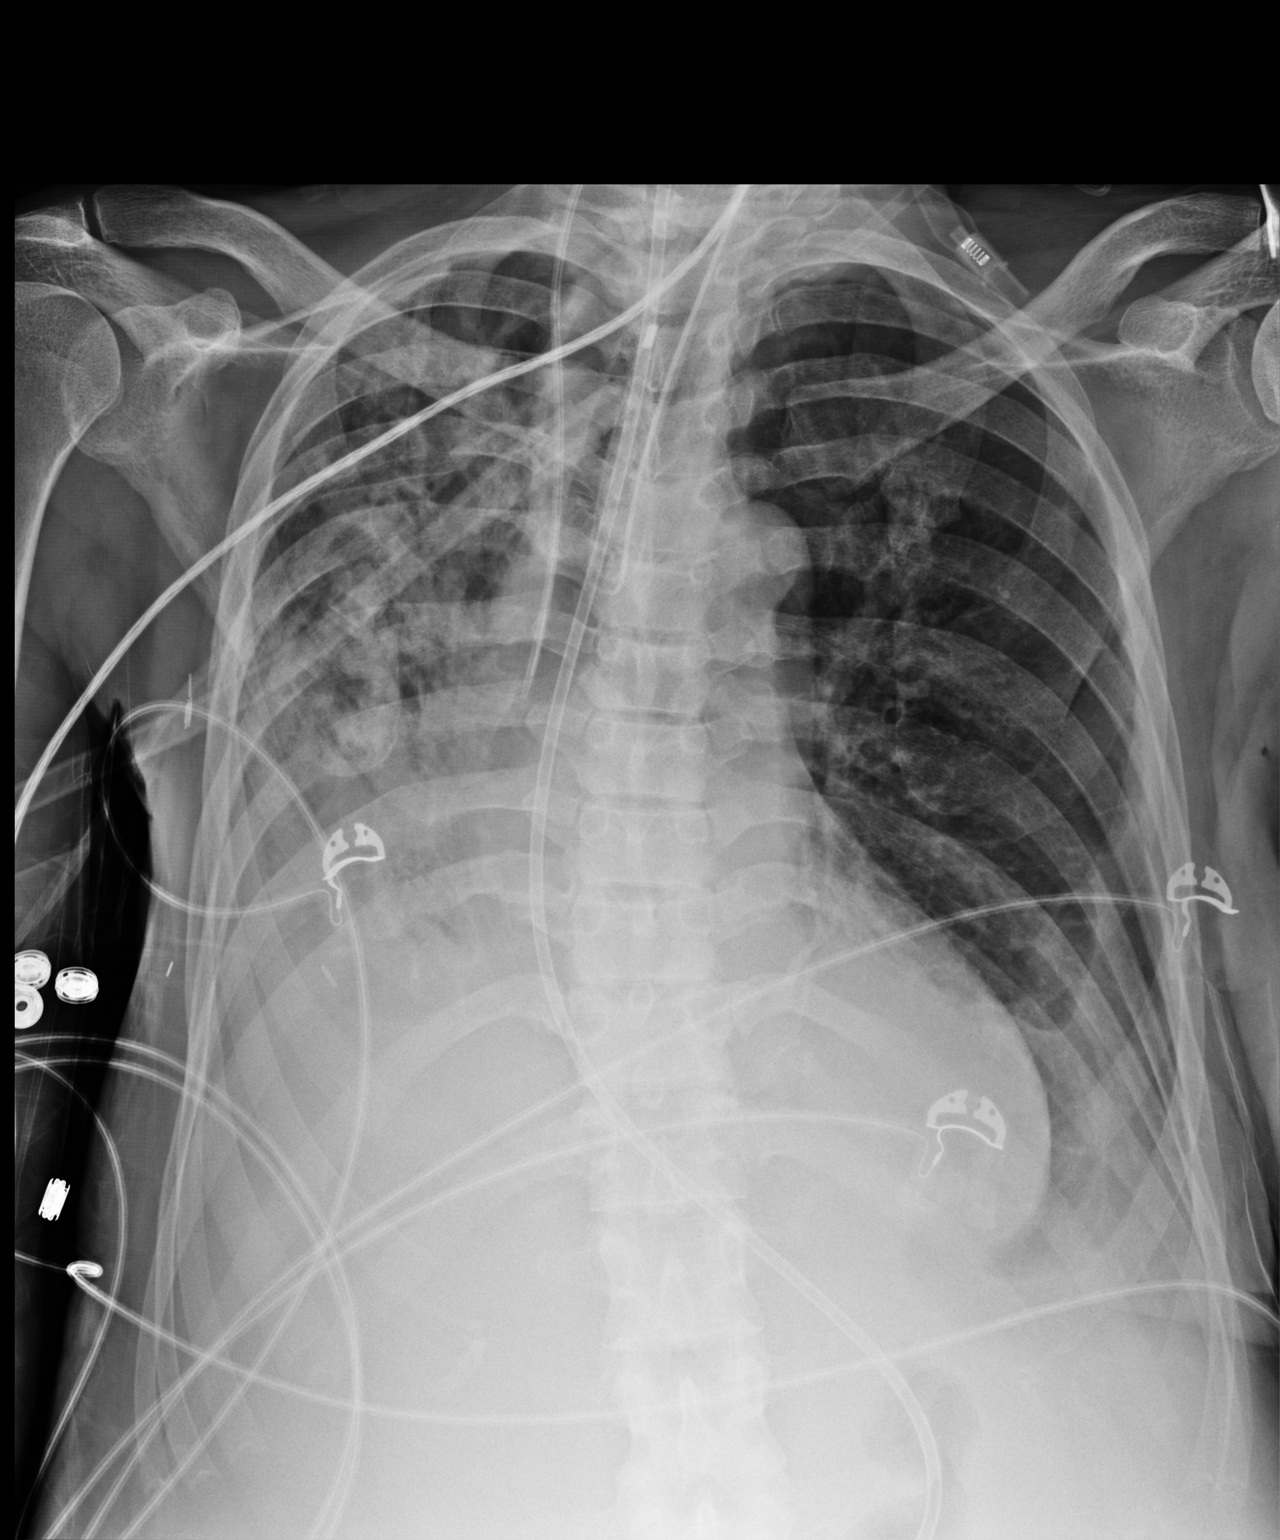

[1 of 1 positions shown; findings below may reference images not displayed]

FINDINGS: Endotracheal tube tip is 1.8 cm above the carina. Feeding tube tip
is below the diaphragm. Central catheter tip is in the superior vena
cava. No pneumothorax. There is airspace consolidation throughout
the right mid and lower lung zones with volume loss. There is
asymmetric interstitial edema on the right. There is a left pleural
effusion with left base atelectasis. Left lung is otherwise clear.
Heart size is normal. Pulmonary vascularity is within normal limits.
No evident adenopathy. No bone lesions.
IMPRESSION: Tube and catheter positions as described without pneumothorax.
Consolidation throughout right mid and lower lung zones with
asymmetric pulmonary edema on the right. Suspect widespread
pneumonia on the right. There is left base atelectasis with left
pleural effusion. Left lung otherwise clear. Cardiac silhouette
stable and within normal limits.

## 2019-05-07 IMAGING — DX DG CHEST 1V PORT
1 series · 1 of 1 positions shown · non-contrast
Comparison: Single-view of the chest earlier today.

CLINICAL DATA: Status post attempted right thoracentesis today.

EXAM:
PORTABLE CHEST 1 VIEW

[chest ap]
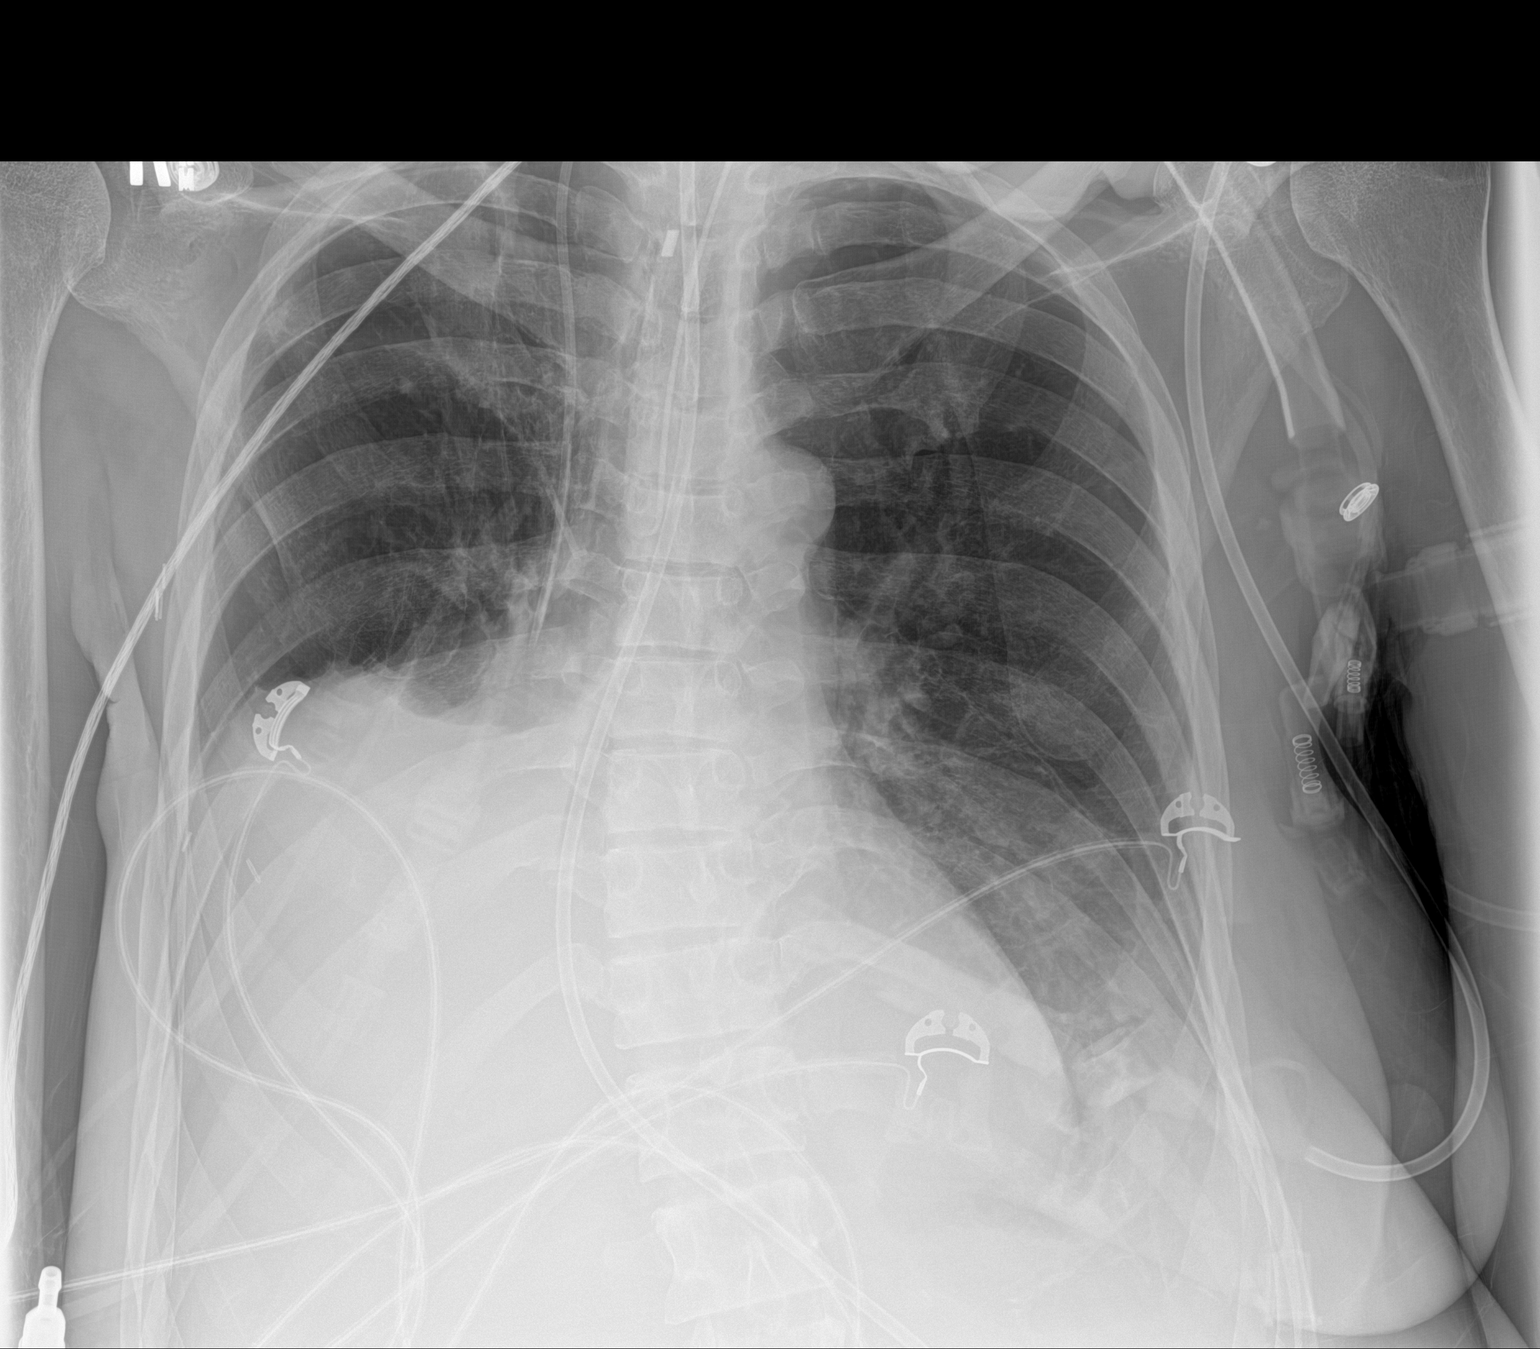

[1 of 1 positions shown; findings below may reference images not displayed]

FINDINGS: Support tubes and lines are unchanged. Extensive right mid and lower
lung zone airspace opacity is unchanged. Milder degree of airspace
disease and left effusion are also unchanged. No pneumothorax.
Cardiac silhouette is obscured. No acute bony abnormality.
IMPRESSION: Negative for pneumothorax after attempted thoracentesis.

No change in right worse than left airspace disease and effusions.

## 2019-08-22 DIAGNOSIS — I70203 Unspecified atherosclerosis of native arteries of extremities, bilateral legs: Secondary | ICD-10-CM | POA: Diagnosis not present

## 2019-08-22 DIAGNOSIS — G35 Multiple sclerosis: Secondary | ICD-10-CM | POA: Diagnosis not present

## 2019-08-22 DIAGNOSIS — J439 Emphysema, unspecified: Secondary | ICD-10-CM | POA: Diagnosis not present

## 2019-08-22 DIAGNOSIS — E46 Unspecified protein-calorie malnutrition: Secondary | ICD-10-CM | POA: Diagnosis not present

## 2019-08-28 DIAGNOSIS — Z933 Colostomy status: Secondary | ICD-10-CM | POA: Diagnosis not present

## 2019-08-28 DIAGNOSIS — M6281 Muscle weakness (generalized): Secondary | ICD-10-CM | POA: Diagnosis not present

## 2019-08-28 DIAGNOSIS — G35 Multiple sclerosis: Secondary | ICD-10-CM | POA: Diagnosis not present

## 2019-10-29 DIAGNOSIS — Z933 Colostomy status: Secondary | ICD-10-CM | POA: Diagnosis not present

## 2019-10-29 DIAGNOSIS — M6281 Muscle weakness (generalized): Secondary | ICD-10-CM | POA: Diagnosis not present

## 2019-10-29 DIAGNOSIS — G35 Multiple sclerosis: Secondary | ICD-10-CM | POA: Diagnosis not present

## 2019-11-18 DIAGNOSIS — G35 Multiple sclerosis: Secondary | ICD-10-CM | POA: Diagnosis not present

## 2019-11-18 DIAGNOSIS — Z1211 Encounter for screening for malignant neoplasm of colon: Secondary | ICD-10-CM | POA: Diagnosis not present

## 2019-11-18 DIAGNOSIS — J439 Emphysema, unspecified: Secondary | ICD-10-CM | POA: Diagnosis not present

## 2019-11-18 DIAGNOSIS — E46 Unspecified protein-calorie malnutrition: Secondary | ICD-10-CM | POA: Diagnosis not present

## 2019-12-04 ENCOUNTER — Ambulatory Visit: Payer: Medicare Other | Attending: Internal Medicine

## 2019-12-04 DIAGNOSIS — Z23 Encounter for immunization: Secondary | ICD-10-CM

## 2019-12-04 NOTE — Progress Notes (Signed)
   Covid-19 Vaccination Clinic  Name:  Melissa Oconnell    MRN: [id number] DOB: Nov 13, 1955  12/04/2019  [name] was observed post Covid-19 immunization for 15 minutes without incident. She was provided with Vaccine Information Sheet and instruction to access the V-Safe system.   [name] was instructed to call 911 with any severe reactions post vaccine: Marland Kitchen Difficulty breathing  . Swelling of face and throat  . A fast heartbeat  . A bad rash all over body  . Dizziness and weakness   Immunizations Administered    Name Date Dose VIS Date Route   Pfizer COVID-19 Vaccine 12/04/2019  8:59 AM 0.3 mL 09/10/2018 Intramuscular   Manufacturer: [employer], [employer]   Lot: [id number]   [place]: [id number]

## 2019-12-10 DIAGNOSIS — G35 Multiple sclerosis: Secondary | ICD-10-CM | POA: Diagnosis not present

## 2019-12-10 DIAGNOSIS — Z933 Colostomy status: Secondary | ICD-10-CM | POA: Diagnosis not present

## 2019-12-10 DIAGNOSIS — M6281 Muscle weakness (generalized): Secondary | ICD-10-CM | POA: Diagnosis not present

## 2019-12-29 ENCOUNTER — Ambulatory Visit: Payer: Medicare Other | Attending: Internal Medicine

## 2019-12-29 DIAGNOSIS — Z23 Encounter for immunization: Secondary | ICD-10-CM

## 2019-12-29 NOTE — Progress Notes (Signed)
   Covid-19 Vaccination Clinic  Name:  AZALEE WEIMER    MRN: [id number] DOB: [birth year]  12/29/2019  Ms. Huntsberry was observed post Covid-19 immunization for 15 minutes without incident. She was provided with Vaccine Information Sheet and instruction to access the V-Safe system.   Ms. Lopata was instructed to call 911 with any severe reactions post vaccine: Marland Kitchen Difficulty breathing  . Swelling of face and throat  . A fast heartbeat  . A bad rash all over body  . Dizziness and weakness   Immunizations Administered    Name Date Dose VIS Date Route   Pfizer COVID-19 Vaccine 12/29/2019  8:59 AM 0.3 mL 09/10/2018 Intramuscular   Manufacturer: [employer], [employer]   Lot: [id number]   [place]: [id number]

## 2020-01-01 DIAGNOSIS — G35 Multiple sclerosis: Secondary | ICD-10-CM | POA: Diagnosis not present

## 2020-01-01 DIAGNOSIS — Z933 Colostomy status: Secondary | ICD-10-CM | POA: Diagnosis not present

## 2020-01-01 DIAGNOSIS — M6281 Muscle weakness (generalized): Secondary | ICD-10-CM | POA: Diagnosis not present

## 2020-01-13 DIAGNOSIS — Z0001 Encounter for general adult medical examination with abnormal findings: Secondary | ICD-10-CM | POA: Diagnosis not present

## 2020-01-13 DIAGNOSIS — E46 Unspecified protein-calorie malnutrition: Secondary | ICD-10-CM | POA: Diagnosis not present

## 2020-01-13 DIAGNOSIS — I70203 Unspecified atherosclerosis of native arteries of extremities, bilateral legs: Secondary | ICD-10-CM | POA: Diagnosis not present

## 2020-01-13 DIAGNOSIS — M949 Disorder of cartilage, unspecified: Secondary | ICD-10-CM | POA: Diagnosis not present

## 2020-01-13 DIAGNOSIS — Z79899 Other long term (current) drug therapy: Secondary | ICD-10-CM | POA: Diagnosis not present

## 2020-02-03 DIAGNOSIS — G35 Multiple sclerosis: Secondary | ICD-10-CM | POA: Diagnosis not present

## 2020-02-03 DIAGNOSIS — Z933 Colostomy status: Secondary | ICD-10-CM | POA: Diagnosis not present

## 2020-02-03 DIAGNOSIS — M6281 Muscle weakness (generalized): Secondary | ICD-10-CM | POA: Diagnosis not present

## 2020-03-17 DEATH — deceased
